# Patient Record
Sex: Female | Born: 1960 | Race: White | Hispanic: No | Marital: Married | Smoking: Never smoker
Health system: Southern US, Community
[De-identification: ages and names within clinical notes are randomized; demographics above are authoritative.]

## PROBLEM LIST (undated history)

## (undated) DIAGNOSIS — F329 Major depressive disorder, single episode, unspecified: Secondary | ICD-10-CM

## (undated) DIAGNOSIS — D329 Benign neoplasm of meninges, unspecified: Secondary | ICD-10-CM

## (undated) DIAGNOSIS — I10 Essential (primary) hypertension: Secondary | ICD-10-CM

## (undated) DIAGNOSIS — N289 Disorder of kidney and ureter, unspecified: Secondary | ICD-10-CM

## (undated) DIAGNOSIS — G43909 Migraine, unspecified, not intractable, without status migrainosus: Secondary | ICD-10-CM

## (undated) DIAGNOSIS — F309 Manic episode, unspecified: Secondary | ICD-10-CM

## (undated) DIAGNOSIS — F319 Bipolar disorder, unspecified: Secondary | ICD-10-CM

## (undated) DIAGNOSIS — D332 Benign neoplasm of brain, unspecified: Secondary | ICD-10-CM

## (undated) DIAGNOSIS — F32A Depression, unspecified: Secondary | ICD-10-CM

## (undated) DIAGNOSIS — F419 Anxiety disorder, unspecified: Secondary | ICD-10-CM

## (undated) HISTORY — DX: Bipolar disorder, unspecified: F31.9

## (undated) HISTORY — PX: INCONTINENCE SURGERY: SHX676

## (undated) HISTORY — PX: INTERSTIM IMPLANT PLACEMENT: SHX5130

## (undated) HISTORY — DX: Migraine, unspecified, not intractable, without status migrainosus: G43.909

## (undated) HISTORY — PX: TUBAL LIGATION: SHX77

## (undated) HISTORY — PX: BRAIN SURGERY: SHX531

## (undated) HISTORY — DX: Essential (primary) hypertension: I10

## (undated) HISTORY — DX: Manic episode, unspecified: F30.9

## (undated) HISTORY — PX: OTHER SURGICAL HISTORY: SHX169

## (undated) HISTORY — PX: KNEE SURGERY: SHX244

## (undated) HISTORY — DX: Anxiety disorder, unspecified: F41.9

## (undated) HISTORY — DX: Benign neoplasm of meninges, unspecified: D32.9

## (undated) HISTORY — PX: ABDOMINAL HYSTERECTOMY: SHX81

---

## 1999-05-11 ENCOUNTER — Encounter: Payer: Self-pay | Admitting: Internal Medicine

## 1999-05-11 ENCOUNTER — Ambulatory Visit (HOSPITAL_COMMUNITY): Admission: RE | Admit: 1999-05-11 | Discharge: 1999-05-11 | Payer: Self-pay | Admitting: Internal Medicine

## 1999-05-18 ENCOUNTER — Ambulatory Visit (HOSPITAL_COMMUNITY): Admission: RE | Admit: 1999-05-18 | Discharge: 1999-05-18 | Payer: Self-pay | Admitting: Gastroenterology

## 1999-06-01 ENCOUNTER — Ambulatory Visit (HOSPITAL_COMMUNITY): Admission: RE | Admit: 1999-06-01 | Discharge: 1999-06-01 | Payer: Self-pay | Admitting: Internal Medicine

## 1999-06-01 ENCOUNTER — Encounter: Payer: Self-pay | Admitting: Internal Medicine

## 2008-08-20 ENCOUNTER — Ambulatory Visit: Payer: Self-pay | Admitting: Psychiatry

## 2008-08-20 ENCOUNTER — Inpatient Hospital Stay (HOSPITAL_COMMUNITY): Admission: EM | Admit: 2008-08-20 | Discharge: 2008-09-02 | Payer: Self-pay | Admitting: Psychiatry

## 2008-09-07 ENCOUNTER — Inpatient Hospital Stay (HOSPITAL_COMMUNITY): Admission: RE | Admit: 2008-09-07 | Discharge: 2008-09-16 | Payer: Self-pay | Admitting: Psychiatry

## 2008-09-27 ENCOUNTER — Ambulatory Visit: Payer: Self-pay | Admitting: Psychiatry

## 2008-09-27 ENCOUNTER — Inpatient Hospital Stay (HOSPITAL_COMMUNITY): Admission: AD | Admit: 2008-09-27 | Discharge: 2008-10-07 | Payer: Self-pay | Admitting: Psychiatry

## 2008-10-10 ENCOUNTER — Ambulatory Visit (HOSPITAL_COMMUNITY): Payer: Self-pay | Admitting: Psychiatry

## 2008-10-19 ENCOUNTER — Ambulatory Visit (HOSPITAL_COMMUNITY): Payer: Self-pay | Admitting: Psychiatry

## 2008-11-02 ENCOUNTER — Ambulatory Visit (HOSPITAL_COMMUNITY): Payer: Self-pay | Admitting: Psychiatry

## 2008-11-16 ENCOUNTER — Ambulatory Visit (HOSPITAL_COMMUNITY): Payer: Self-pay | Admitting: Psychiatry

## 2008-12-07 ENCOUNTER — Ambulatory Visit (HOSPITAL_COMMUNITY): Payer: Self-pay | Admitting: Psychiatry

## 2008-12-27 ENCOUNTER — Ambulatory Visit (HOSPITAL_COMMUNITY): Payer: Self-pay | Admitting: Psychiatry

## 2008-12-29 ENCOUNTER — Ambulatory Visit: Payer: Self-pay | Admitting: Psychiatry

## 2008-12-29 ENCOUNTER — Inpatient Hospital Stay (HOSPITAL_COMMUNITY): Admission: AD | Admit: 2008-12-29 | Discharge: 2009-01-06 | Payer: Self-pay | Admitting: Psychiatry

## 2009-01-09 ENCOUNTER — Ambulatory Visit (HOSPITAL_COMMUNITY): Payer: Self-pay | Admitting: Psychiatry

## 2009-01-31 ENCOUNTER — Ambulatory Visit (HOSPITAL_COMMUNITY): Payer: Self-pay | Admitting: Psychiatry

## 2009-02-09 ENCOUNTER — Ambulatory Visit (HOSPITAL_COMMUNITY): Payer: Self-pay | Admitting: Psychiatry

## 2009-04-25 ENCOUNTER — Ambulatory Visit (HOSPITAL_COMMUNITY): Payer: Self-pay | Admitting: Psychiatry

## 2009-05-11 ENCOUNTER — Ambulatory Visit (HOSPITAL_COMMUNITY): Payer: Self-pay | Admitting: Psychiatry

## 2009-05-25 ENCOUNTER — Ambulatory Visit (HOSPITAL_COMMUNITY): Payer: Self-pay | Admitting: Psychiatry

## 2009-06-20 ENCOUNTER — Ambulatory Visit (HOSPITAL_COMMUNITY): Payer: Self-pay | Admitting: Psychiatry

## 2009-07-20 ENCOUNTER — Ambulatory Visit (HOSPITAL_COMMUNITY): Payer: Self-pay | Admitting: Psychiatry

## 2009-09-26 ENCOUNTER — Ambulatory Visit (HOSPITAL_COMMUNITY): Payer: Self-pay | Admitting: Psychiatry

## 2009-10-24 ENCOUNTER — Ambulatory Visit (HOSPITAL_COMMUNITY): Payer: Self-pay | Admitting: Psychiatry

## 2009-11-23 ENCOUNTER — Ambulatory Visit (HOSPITAL_COMMUNITY): Payer: Self-pay | Admitting: Psychiatry

## 2010-02-22 ENCOUNTER — Ambulatory Visit (HOSPITAL_COMMUNITY): Payer: Self-pay | Admitting: Psychiatry

## 2010-05-10 ENCOUNTER — Ambulatory Visit (HOSPITAL_COMMUNITY): Payer: Self-pay | Admitting: Psychiatry

## 2010-06-07 ENCOUNTER — Ambulatory Visit (HOSPITAL_COMMUNITY): Payer: Self-pay | Admitting: Psychiatry

## 2010-08-07 ENCOUNTER — Ambulatory Visit (HOSPITAL_COMMUNITY): Payer: Self-pay | Admitting: Psychiatry

## 2010-08-21 ENCOUNTER — Ambulatory Visit (HOSPITAL_COMMUNITY): Payer: Self-pay | Admitting: Psychiatry

## 2010-09-18 ENCOUNTER — Ambulatory Visit (HOSPITAL_COMMUNITY): Payer: Self-pay | Admitting: Psychiatry

## 2010-11-20 ENCOUNTER — Ambulatory Visit (HOSPITAL_COMMUNITY): Payer: Self-pay | Admitting: Psychiatry

## 2011-01-11 ENCOUNTER — Ambulatory Visit (HOSPITAL_COMMUNITY)
Admission: RE | Admit: 2011-01-11 | Discharge: 2011-01-11 | Payer: Self-pay | Source: Home / Self Care | Attending: Urology | Admitting: Urology

## 2011-01-11 ENCOUNTER — Ambulatory Visit
Admission: RE | Admit: 2011-01-11 | Discharge: 2011-01-11 | Payer: Self-pay | Source: Home / Self Care | Attending: Urology | Admitting: Urology

## 2011-01-22 ENCOUNTER — Ambulatory Visit (HOSPITAL_COMMUNITY)
Admission: RE | Admit: 2011-01-22 | Discharge: 2011-01-22 | Payer: Self-pay | Source: Home / Self Care | Attending: Psychiatry | Admitting: Psychiatry

## 2011-02-28 ENCOUNTER — Encounter (INDEPENDENT_AMBULATORY_CARE_PROVIDER_SITE_OTHER): Payer: Medicare (Managed Care) | Admitting: Psychiatry

## 2011-02-28 DIAGNOSIS — F3189 Other bipolar disorder: Secondary | ICD-10-CM

## 2011-03-19 ENCOUNTER — Encounter (HOSPITAL_COMMUNITY): Payer: Self-pay | Admitting: Psychiatry

## 2011-04-08 LAB — CBC
HCT: 43.6 % (ref 36.0–46.0)
Hemoglobin: 15.1 g/dL — ABNORMAL HIGH (ref 12.0–15.0)
MCHC: 34.6 g/dL (ref 30.0–36.0)
MCV: 93.3 fL (ref 78.0–100.0)
Platelets: 335 10*3/uL (ref 150–400)
RBC: 4.67 MIL/uL (ref 3.87–5.11)
RDW: 13.4 % (ref 11.5–15.5)
WBC: 15.1 10*3/uL — ABNORMAL HIGH (ref 4.0–10.5)

## 2011-04-08 LAB — COMPREHENSIVE METABOLIC PANEL
ALT: 11 U/L (ref 0–35)
AST: 14 U/L (ref 0–37)
Albumin: 4.1 g/dL (ref 3.5–5.2)
Alkaline Phosphatase: 102 U/L (ref 39–117)
BUN: 8 mg/dL (ref 6–23)
CO2: 30 mEq/L (ref 19–32)
Calcium: 10.1 mg/dL (ref 8.4–10.5)
Chloride: 104 mEq/L (ref 96–112)
Creatinine, Ser: 0.87 mg/dL (ref 0.4–1.2)
GFR calc Af Amer: >60 mL/min (ref >60)
GFR calc non Af Amer: >60 mL/min (ref >60)
Glucose, Bld: 101 mg/dL — ABNORMAL HIGH (ref 70–99)
Potassium: 4.6 mEq/L (ref 3.5–5.1)
Sodium: 140 mEq/L (ref 135–145)
Total Bilirubin: 0.9 mg/dL (ref 0.3–1.2)
Total Protein: 6.7 g/dL (ref 6.0–8.3)

## 2011-04-08 LAB — TSH: TSH: 3.448 u[IU]/mL (ref 0.350–4.500)

## 2011-04-08 LAB — LITHIUM LEVEL
Lithium Lvl: 0.92 mEq/L (ref 0.80–1.40)
Lithium Lvl: 1.12 mEq/L (ref 0.80–1.40)

## 2011-04-25 ENCOUNTER — Encounter (INDEPENDENT_AMBULATORY_CARE_PROVIDER_SITE_OTHER): Payer: Medicare (Managed Care) | Admitting: Psychiatry

## 2011-04-25 DIAGNOSIS — F3189 Other bipolar disorder: Secondary | ICD-10-CM

## 2011-05-07 NOTE — H&P (Signed)
[name], [name]                  ACCOUNT NO.:  [id number]   MEDICAL RECORD NO.:  [id number]          PATIENT TYPE:  IPS   LOCATION:  0405                          FACILITY:  [facility]   PHYSICIAN:  Anselm Jungling, MD  DATE OF BIRTH:  March 16, 1961   DATE OF ADMISSION:  08/20/2008  DATE OF DISCHARGE:                       PSYCHIATRIC ADMISSION ASSESSMENT   IDENTIFYING INFORMATION/JUSTIFICATION FOR ADMISSION AND CARE:  This is a  50 year old married white female.  They presented to the emergency  department at [facility] yesterday.  They reported that the The  patient is bipolar and having a depressive episode.  She is experiencing  auditory hallucinations that are command in nature, telling her to hurt  herself.  In fact, she actually broke her right arm, in response to  these voices, about 2 weeks ago.  Apparently, she hit her right arm with  a hammer, she scraped her head on bricks and attempted to go down a  clothes chute head first in the past.   The patient reports having had a benign brain tumor removed at [facility] in  [place] back in 2000.  Her psychiatric symptoms began shortly after  that.  She is followed in [place] even now.  She began having  psychiatric treatment in 2003.   SOCIAL HISTORY:  She is a high Garment/textile technologist in 1982.  She has been  married once.  She has 2 children, both are boys, 25 and 20.  She has  received disability since 2000.   FAMILY HISTORY:  She denies.   ALCOHOL/DRUG HISTORY:  She denies.   PRIMARY CARE Mikhael Hendriks:  She has just obtained primary care with Dr.  Pollyann Kennedy in [place].   She is followed psychiatrically in [place], and that physician's name  is Dr. Alycia Rossetti.  She is currently prescribed lithium 1200 mg at hour of  sleep, Lamictal 200 mg at hour of sleep, Klonopin 1 mg at hour of sleep,  perphenazine 8 mg at hour of sleep, and Celexa 20 mg p.o. every morning.   DRUG ALLERGIES:  1. DEPAKOTE.  2. DILANTIN.  3. TRILEPTAL.  4.  ERYTHROMYCIN-BASED MEDICINES.   POSITIVE PHYSICAL FINDINGS:  She was medically cleared in the ED at  [facility].  She does have a cast intact to her right lower forearm.  Her lithium level is low at 0.56.  he had no alcohol, she had no drugs  in her system that were not prescribed and her WBC was slightly elevated  at 11.4.   MENTAL STATUS EXAM:  Today she is alert, she is oriented.  She is  appropriately groomed, dressed and nourished.  Her speech is a normal  rate, rhythm and tone.  Her mood is calm.  Her affect has a decreased  range.  Her thought processes are clear, rational and goal-oriented.  She wants to get rid of the auditory hallucinations.  Judgment and  insight are good this morning.  Concentration and memory are good.  Intelligence is at least average.  She denies being actively suicidal or  homicidal.  She states the last time she had  auditory hallucinations is  last night and she has not had any visual hallucinations.   DIAGNOSIS:  AXIS I:  Bipolar, depressed; lithium level is  subtherapeutic.  AXIS II:  Deferred.  AXIS III:  Fracture of right arm in response to command auditory  hallucination to hurt herself 2 weeks ago.  AXIS IV:  Disabled.  AXIS V:  32.   PLAN:  Adjust her meds to get relief from the auditory hallucinations  and she does have psychiatric care in [place] with Dr. Alycia Rossetti and she can  return home with her husband once her meds have been successfully  adjusted.   ESTIMATED LENGTH OF STAY:  Three to five days.      Mickie Leonarda Salon, P.A.-C.      Anselm Jungling, MD  Electronically Signed    MD/MEDQ  D:  08/21/2008  T:  08/21/2008  Job:  [id number]

## 2011-05-07 NOTE — Discharge Summary (Signed)
NAMESEELEY, SOUTHGATE                  ACCOUNT NO.:  [id number]   MEDICAL RECORD NO.:  [id number]          PATIENT TYPE:  IPS   LOCATION:  0306                          FACILITY:  [facility]   PHYSICIAN:  Anselm Jungling, MD  DATE OF BIRTH:  [birth year]   DATE OF ADMISSION:  09/27/2008  DATE OF DISCHARGE:  10/07/2008                               DISCHARGE SUMMARY   IDENTIFYING DATA AND REASON FOR ADMISSION:  This was the third [facility]  admission for [name], Sue 50 year old married Caucasian [name], who was  admitted because of severe depression, which was accompanied by suicidal  ideation.  Please refer to the admission note for further details  pertaining to the symptoms, circumstances and history that led to her  hospitalization.  She was given an initial Axis I diagnosis of major  depressive disorder, recurrent.   MEDICAL AND LABORATORY:  The patient was medically and physically  assessed by the psychiatric nurse practitioner.  She was in generally  good health without any active or chronic medical problems.  She was  continued on Claritin 10 mg daily for seasonal allergies.   HOSPITAL COURSE:  The patient was admitted to the adult inpatient  psychiatric service.  She presented as Sue well-nourished, normally-  developed, adult [name] with Sue very pleasant and sweet demeanor, but who  was very depressed.  Her level of insight was fairly good, and she  showed consistently good judgment, cooperation, and participation  throughout her stay.  She was able to identify and work on various  family stressors.   The patient had 2 inpatient admissions with [place] over the prior 4 months.  During those admissions, she also had symptoms consisting of conversion-  like complaints regarding little men who caused her to commit acts of  self-harm.  None of these such symptoms were present in this particular  hospital stay.  Instead, the patient was focused in Sue very realistic  fashion on the sources of her  depression, coming from her family  conflict.   She was continued on her regimen of Lamictal, Celexa, Trilafon, lithium,  Klonopin, trazodone.   By the 11th hospital day, the patient indicated that she was feeling  much better, less depressed, and confident about being able to go home  and continue her treatment successfully on an outpatient basis.  She  agreed to the following aftercare plan.   AFTERCARE:  The patient was to follow-up with Dr. Lolly Mustache in our  [place] outpatient clinic.  She was also to see Dr. Florencia Reasons, for  individual counseling.  Her appointment with Florencia Reasons was scheduled  for October 19.   DISCHARGE MEDICATIONS:  1. Lamictal 50 mg q.Sue.m. and 200 mg every night.  2. Celexa 20 mg daily.  3. Trilafon 18 mg every night.  4. Lithium carbonate 900 mg b.i.d.  5. Trazodone 50 mg every night.  6. Claritin 10 mg daily.  7. Klonopin 1 mg every night.   DISCHARGE DIAGNOSES:  AXIS I:  Bipolar disorder, type 2, most recently  depressed.  AXIS II:  Deferred.  AXIS III:  No acute or chronic illnesses.  AXIS IV:  Stressors severe.  AXIS V:  GAF on discharge 60.      Anselm Jungling, MD  Electronically Signed     SPB/MEDQ  D:  10/13/2008  T:  10/13/2008  Job:  [id number] [id number]

## 2011-05-07 NOTE — H&P (Signed)
[name], [name]                  ACCOUNT NO.:  [id number]   MEDICAL RECORD NO.:  [id number]          PATIENT TYPE:  IPS   LOCATION:  0400                          FACILITY:  [facility]   PHYSICIAN:  Anselm Jungling, MD  DATE OF BIRTH:  29-Aug-1961   DATE OF ADMISSION:  09/07/2008  DATE OF DISCHARGE:                       PSYCHIATRIC ADMISSION ASSESSMENT   TIME OF ASSESSMENT:  8:30 a.m.   IDENTIFYING INFORMATION:  A 50 year old Caucasian female.  This is a  voluntary admission.   HISTORY OF PRESENT ILLNESS:  This is the second [facility] admission for this  50 year old with a history of psychotic like symptoms associated with  some command hallucinations.  She was previously discharged from our  unit this past Friday, September 11 after treatment for auditory  hallucinations, claims that she is hearing voices and seeing little  green men that were telling her to harm herself.  She had taken a  hammer and a rolling pin to her right forearm banging on it, thought  that she had fractured her wrist.  She reports that she went home,  taking her medications as prescribed, had a nightmare one night about  attempting to hang herself, woke up anxious the following morning and on  the day prior to admission she had been talking with her son about his  vacation, suddenly jumped up, ran to the side of the house and began  banging her head on the brick wall.  Obviously this caused the family a  lot of concern.  She visited her psychotherapist yesterday who referred  her back for admission.  Today she reports that she feels safe here in  the hospital, has not seen or heard any strange voices, had no  hallucinations today and slept well last night.  She continues to have  fears that she will hear the voices again and that she will harm her  arm.  Meanwhile, she had seen the orthopedist who advised removing the  splint from her right forearm, that there is no fracture.   PAST PSYCHIATRIC HISTORY:  [facility]  admission.  She currently is  followed as an outpatient at [facility] for counseling  and for medications.  She lives in [place] and in 2000 had a benign  brain tumor removed at [facility] of [place].  She reports that  her psychiatric symptoms began shortly after that.  She was most  recently followed at [facility] and was diagnosed  with pseudoseizures and about 4 months ago the Keppra which she has  previously taken for what she believed to be seizures was discontinued.  She has history of one prior suicide attempt by attempting to hang  herself with an electrical cord.   SOCIAL HISTORY:  High school graduate in 1982.  Currently married.  Reports that her husband is supportive, marriage is good.  She has two  children, sons ages 54 and 7.  Has been on disability since 2000  because of the brain tumor.   FAMILY HISTORY:  Denies a family history of mental illness or substance  abuse.  She  denies any history of substance abuse.   PRIMARY CARE Kalid Ghan:  Is Dr. Pollyann Kennedy in [place], [place].   MEDICAL PROBLEMS:  Recent history of contusion, right wrist.  No  fracture.  Other medical problems are none.   CURRENT MEDICATIONS:  1. Lithium 900 mg q.a.m. and q.h.s.  2. Lamictal 50 mg in the morning, 200 mg at h.s.  3. Klonopin 1 mg at h.s.  4. Perphenazine 16 mg p.o. q.h.s.  5. Celexa 20 mg daily.  6. Zyprexa Zydis 15 mg at h.s.  7. Robaxin 500 mg t.i.d. p.r.n. for muscle spasms that she was having      in her right arm while splinted.   DRUG ALLERGIES:  Are DEPAKOTE, DILANTIN, TRILEPTAL, ERYTHROMYCIN.   PHYSICAL EXAM:  Was done in the emergency room and is as noted in the  record and is generally unremarkable.  Her right forearm appears  completely normal.  She has full range of motion.   DIAGNOSTIC STUDIES:  CBC - WBC 10.6, hemoglobin 13.2, hematocrit 39.9,  platelets 326,000 and MCV 94.2.  Chemistry - sodium 140, potassium 3.8,   chloride 107, carbon dioxide 28, BUN 10, creatinine 0.77 and random  glucose 92.  Liver enzymes SGOT 33, SGPT 54, alkaline phosphatase 89,  total bilirubin 0.9.   MENTAL STATUS EXAM:  Fully alert female, pleasant, cooperative, fairly  bright affect.  She is appropriate, cooperative, polite.  Full range of  expression.  Speech is normal in pace, tone, production.  She gives a  coherent history.  Has relatively bright affect even when she is talking  about banging her head into the brick wall.  Reports she has no known  trigger for these episodes.  Unable to articulate any particular type of  stressor or be clear about what was going through her mind prior to this  episode.  Says that the episodes come out of the blue.  Has fears that  she will harm herself.  Feels that she cannot control herself.  No  homicidal thought.  Denies suicidal intent but admits that she feels out  of control.  Cognition is intact to orientation.  Immediate, recent and  remote memory are intact.  She is fully oriented and in full contact  with reality.   AXIS I:  Psychosis not otherwise specified, rule out fictitious disorder  versus conversion disorder.  AXIS II:  Deferred.  AXIS III:  Contusion, right wrist.  AXIS IV:  Deferred.  AXIS V:  Current 43, past year not known.   PLAN:  Is to voluntarily admit her to stabilize her thinking, evaluate  her mood.  She is on our intensive care unit in our stabilization  program and we started her on one-to-one observation for safety.  She  plans on continuing with counseling when she leaves and at this point we  are continuing her current medications.  On arrival in the unit she did  receive an extra Zyprexa 5 mg and Klonopin 1 mg.  We have continued her  current medications with the exception that we are going to discontinue  the Robaxin since she is no longer splinted and having problems with  muscle spasms and we will add Zyprexa 5 mg t.i.d. p.r.n. Zydis for   agitation.      Margaret A. Lorin Picket, N.P.      Anselm Jungling, MD  Electronically Signed    MAS/MEDQ  D:  09/08/2008  T:  09/11/2008  Job:  [id number]

## 2011-05-07 NOTE — Discharge Summary (Signed)
[name], [name]                  ACCOUNT NO.:  [id number]   MEDICAL RECORD NO.:  [id number]          PATIENT TYPE:  IPS   LOCATION:  0406                          FACILITY:  [facility]   PHYSICIAN:  Anselm Jungling, MD  DATE OF BIRTH:  [birth year]   DATE OF ADMISSION:  08/20/2008  DATE OF DISCHARGE:  09/02/2008                               DISCHARGE SUMMARY   IDENTIFYING DATA/REASON FOR ADMISSION:  The patient is a 50 year old  married Caucasian female who was admitted due to increasing symptoms of  psychosis, and self-inflicted injury.  Please refer to the admission  note for further details pertaining to the symptoms, circumstances and  history that led to her hospitalization.   MEDICAL AND LABORATORY:  The patient was medically and physically  assessed by the psychiatric nurse practitioner.  She had been seen by an  orthopedist in her hometown of [place], [place] for injury sustained  to her right forearm, by her own attempts to injure it.  She had a  supportive soft cast and brace on throughout her hospital stay.   Aside from this, there were no acute medical issues.  The patient was  given Robaxin because of some complaints of muscle spasm in that arm,  and p.r.n. ibuprofen.   HOSPITAL COURSE:  The patient was admitted to the adult inpatient  psychiatric service.  She presented as a well-nourished, normally-  developed adult female who was initially seen and evaluated by Dr.  Geoffery Lyons.  The undersigned assumed care of the patient on the third  hospital day.   The patient had come to [place] with a previous history of mood disorder, and  a history of benign brain tumor, meningioma, according to the patient,  removed in 2000.  She states that she had psychiatric symptoms that  began shortly after that.  She began having formal psychiatric treatment  in 2003.  The physician that had been following her for her psychiatric  medication is Dr. Alycia Rossetti in [place], [place].  She came to  [place] on a  regimen of lithium, Lamictal, Klonopin, perphenazine, and Celexa.   The patient reported that she was experiencing psychotic symptoms in the  form of little men who appear in her mind, and command her to commit  various acts of self-harm.  She could not explain these little men and  did not actually appear to believe that they existed in reality, but  they clearly existed in her thinking and in her internal psyche  experience, and she felt that their commands were quite compelling.  She  desperately wanted help dealing with this.  She did not present with any  other overt signs or symptoms of psychosis.  Her thoughts and speech  were normally organized.  She was consistently pleasant, sweet natured,  cooperative, and a pleasure to work with.   To address her hallucinatory symptoms, she was started on a trial of low-  dose Zyprexa, while her other psychotropics were continued.  Her lithium  level was therapeutic.   The patient required one-to-one observation during most of her hospital  stay, because of the propensity for episodes of attempted self-harm, in  response to the little men to come on very suddenly.  There were  incidents in which she did attempt to harm herself, and staff needed to  physically prevent her from doing this.   Zyprexa was increased to an ultimate dose of 15 mg q.h.s., over the  course of her stay.  During the last 4 days of her hospital stay, the  patient reported that she had had episodes where the little men wanted  her to harm herself, but she was able to resist them and tell them no.  There were no attempted incidences of self-harm during her last 4  hospital days.  Her mental status remained overtly clear during this  entire time.   The patient was taken off one-to-one observation for the last 72 hours  or so of her hospital stay.   Her husband was involved in her care, and he was available for a family  session during her stay.  He was  tremendously supportive.   Although the patient said condition improved significantly by the time  of discharge and there was no reason to believe that she warranted a  longer inpatient psychiatric stay, it was anticipated that she would  need close psychiatric follow-up following her discharge.  She indicated  that she wanted to be referred to a psychiatrist in the [place] area,  and she no longer wanted to travel to [place], [place] for her care.  This was accomplished (see below).  We also discussed with her the  possibility of coming to our intensive outpatient psychiatric program  following her inpatient stabilization.  We determined that this was  impractical due to the geographic distance, and due to the patient not  being able to drive given her current medication regimen.   AFTERCARE:  The patient was discharged to her husband in good spirits on  the 14th hospital day.  She was to follow-up at [facility] counseling  services with an appointment for intake on September 06, 2008.   DISCHARGE MEDICATIONS:  1. Lithium carbonate 600 mg q.a.m. and 1200 mg q.h.s.  2. Lamictal 50 mg q.a.m. and 200 mg q.h.s.  3. Klonopin 1 mg q.h.s.  4. Perphenazine 16 mg q.h.s.  5. Celexa 20 mg daily.  6. Zyprexa Zydis 15 mg q.h.s.  7. Robaxin 500 mg t.i.d.   The patient was instructed to follow-up with her usual orthopedist in  [place], [place], and she did have an appointment scheduled for him  during the week following her discharge.   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  AXIS III: Status post right arm injury.  AXIS IV: Stressors severe.  AXIS V: GAF on discharge 55.      Anselm Jungling, MD  Electronically Signed     SPB/MEDQ  D:  09/02/2008  T:  09/03/2008  Job:  [id number]

## 2011-05-07 NOTE — Discharge Summary (Signed)
[name], [name]                  ACCOUNT NO.:  [id number]   MEDICAL RECORD NO.:  [id number]          PATIENT TYPE:  IPS   LOCATION:  0305                          FACILITY:  BH   PHYSICIAN:  Anselm Jungling, MD  DATE OF BIRTH:  12-Apr-1961   DATE OF ADMISSION:  12/29/2008  DATE OF DISCHARGE:  01/06/2009                               DISCHARGE SUMMARY   IDENTIFYING DATA AND REASON FOR ADMISSION:  This is one of several [facility]  admissions for [name], a 50 year old married Caucasian female who was  admitted due to increasing depression, with impulses to harm herself.  Please refer to the admission note for further details pertaining to the  symptoms, circumstances and history that led to her hospitalization.  She was given an initial Axis I diagnosis of major depressive disorder,  recurrent.   LABORATORY:  The patient was in good health without any active or  chronic medical problems.  She had inflicted some abrasions on her  forehead prior to admission.  There were no acute medical issues.   HOSPITAL COURSE:  The patient was admitted to the adult inpatient  psychiatric service.  She presented as a well-nourished, normally-  developed adult female who was familiar to the undersigned in our staff.  She was consistently pleasant, cooperative throughout, but very sad and  depressed.  She was able to show good insight into some of the sources  of her depression, including the recent estrangement from her adult son,  daughter-in-law, and their new grandchild, who the patient had not been  able to see over the entire holiday..   The patient was continued on her psychotropic regimen, which continued  with Lamictal, Klonopin, Celexa, Ambien, lithium, and Cogentin.  The  patient had been taking Trilafon as well, but did not feel that it  agreed with her.  As such, Haldol was introduced on a trial basis in  favor of Trilafon.  This seemed to be very effective and the patient was  pleased with a  response that she got.  She stated that she no longer  felt odd the way she had on the Trilafon.   On the eighth hospital day the patient appeared appropriate for  discharge.  There was family session involving the patient and her  husband, who was again very supportive of her prior to discharge.  The  following aftercare plans were discussed at length.   AFTERCARE:  The patient was to follow up at the [facility] for medication management with Dr. Madie Reno on February 9.  She  was to continue with her usual outpatient psychotherapist, Florencia Reasons  on January 09, 2009.   DISCHARGE DIAGNOSIS:  1. AXIS I:  Major depressive disorder, recurrent.  2. AXIS II:  Deferred.  3. AXIS III:  No acute or chronic illnesses.  4. AXIS IV: Stressors severe.  5. AXIS V: GAF on discharge 55.   DISCHARGE MEDICATIONS:  1. Lamictal 50 mg q.a.m. and 200 mg every night.  2. Klonopin 1 mg every night.  3. Celexa 20 mg daily.  4. Ambien 10 mg every night..  5. Lithium 300 mg b.i.d.  6. Haldol 10 mg every night.  7. Cogentin 0.5 mg b.i.d..      Anselm Jungling, MD  Electronically Signed     SPB/MEDQ  D:  01/10/2009  T:  01/10/2009  Job:  [id number]

## 2011-05-07 NOTE — Discharge Summary (Signed)
[name], [name]                  ACCOUNT NO.:  [id number]   MEDICAL RECORD NO.:  [id number]          PATIENT TYPE:  IPS   LOCATION:  0400                          FACILITY:  [facility]   PHYSICIAN:  Anselm Jungling, MD  DATE OF BIRTH:  Oct 07, 1961   DATE OF ADMISSION:  09/07/2008  DATE OF DISCHARGE:  09/16/2008                               DISCHARGE SUMMARY   IDENTIFYING DATA AND REASON FOR ADMISSION:  The patient is a 50 year old  married woman, and this was her second [facility] admission.  She had been  discharged approximately one week prior.  She was readmitted due to  signs and symptoms of depression, and strong urges to harm herself.  Please refer to the admission note for further details pertaining to the  symptoms, circumstances and history that led to her hospitalization.  She was given an initial Axis I diagnosis of mood disorder NOS.   MEDICAL AND LABORATORY:  The patient was medically and physically  assessed by the psychiatric nurse practitioner.  She was in good health  without any active or chronic medical problems.   HOSPITAL COURSE:  The patient was admitted to the adult inpatient  psychiatric service.  She presented as a very pleasant, intelligent  woman who was very sad, depressed and worried about her situation.  She  was completely cooperative throughout her entire inpatient stay.  She  was afraid of her impulses to harm herself and as such, one to one  observation was utilized during the initially portion of her stay, until  we and the patient were both confident that she was able to control  these impulses and that the risk of self harm was significantly reduced.   She was continued on a psychotropic regimen that included lithium,  Lamictal, Celexa, Klonopin and Trilafon.   The patient admitted to some longstanding family issues that were  enormously stressful to here.  These centered around a rift that had  developed between her oldest son and daughter-in-law, who  have just  given birth to the patient's first grandchild.  This rift has led to a  lack of contact between the patient and these other family members.  She  has felt unable to do anything about it, but was able to acknowledge the  likelihood that this immediate stressor was the most likely cause for  her various underlying depression and symptoms.   Towards the end of her hospital stay the undersigned met with the  patient, her husband and her youngest son to discuss her issues and  treatment.  We discussed the family dynamics at length.  Husband was  enormously supportive.  He confirmed that there had been a significant  rift within the family.  Both the patient and her husband were in  agreement that it would make sense for family therapy to occur.  We  discussed how the family might obtain this through [facility], where [place] had already begun to receive some services in the  form of individual counseling and medication management.   The patient felt quite encouraged by this meeting,  and I have requested  discharge shortly following this.   AFTERCARE:  The patient was to continue with her usual followup at the  [facility], for individual counseling, family  counseling and medication management.   DISCHARGE MEDICATIONS:  1. Lithium 900 mg b.i.d.  2. Lamictal 50 mg daily and 200 mg nightly.  3. Celexa 30 mg daily.  4. Klonopin 1 mg nightly.  5. Trilafon 24 mg nightly.   The patient was instructed to stop Zyprexa which she had been on prior  to admission.   DISCHARGE DIAGNOSES:  AXIS I:  Atypical bipolar disorder NOS.  AXIS II:  Deferred.  AXIS III:  No acute or chronic illnesses.  AXIS IV:  Stressors, severe.  AXIS V:  Global Assessment of Functioning on discharge 60.      Anselm Jungling, MD  Electronically Signed     SPB/MEDQ  D:  09/29/2008  T:  09/29/2008  Job:  [id number]

## 2011-05-28 ENCOUNTER — Encounter (INDEPENDENT_AMBULATORY_CARE_PROVIDER_SITE_OTHER): Payer: Medicare (Managed Care) | Admitting: Psychiatry

## 2011-05-28 DIAGNOSIS — F3189 Other bipolar disorder: Secondary | ICD-10-CM

## 2011-06-27 ENCOUNTER — Encounter (HOSPITAL_COMMUNITY): Payer: Medicare (Managed Care) | Admitting: Psychiatry

## 2011-07-02 ENCOUNTER — Encounter (HOSPITAL_COMMUNITY): Payer: Medicare (Managed Care) | Admitting: Psychiatry

## 2011-07-09 ENCOUNTER — Encounter (INDEPENDENT_AMBULATORY_CARE_PROVIDER_SITE_OTHER): Payer: Medicare (Managed Care) | Admitting: Psychiatry

## 2011-07-09 DIAGNOSIS — F3189 Other bipolar disorder: Secondary | ICD-10-CM

## 2011-07-10 NOTE — Progress Notes (Signed)
[name], [facility]                  ACCOUNT NO.:  [id number]  MEDICAL RECORD NO.:  [id number]  LOCATION:  [facility]                           FACILITY:  [facility]  PHYSICIAN:  Kerina Simoneau T. Ariellah Faust, M.D.   DATE OF BIRTH:  [birth year]                                PROGRESS NOTE   The patient came in for her appointment.  She has been under a lot of stress.  She admitted being tearful, crying and having poor sleep.  A week ago, she called [place] for the same reason, and we had increased her Risperdal to 1 mg and also restarted the lithium at 1200 mg a day. Earlier, lithium was decreased due to the patient's continuing to have nausea in the morning.  However, the patient realized that the nausea was not related to lithium as she tried to overdose, but she continued to have nausea.  The patient admitted that she is under a lot of stress. Her son called last week and did not invite them for their grandchild's birthday.  The patient became very sad, disappointed and has been having episodes of crying tearfulness and depressive thoughts.  She admitted even passive suicidal thinking, as she is very attached to the family. However, there was no active suicidal thinking.  Her husband has been watching carefully her medication.  The patient admitted that despite taking 1 mg of Risperdal, Wellbutrin 300 in divided doses, lithium 1200 mg in divided doses and Klonopin 1 mg at bedtime, she continued to have struggle sleeping in the night.  She also feels racing thoughts, anxiety and panic attack.  She had tried taking an extra half of Klonopin last night that did actually help a lot.  She is wondering if the Klonopin can be increased a further 1/2 mg to make it 1.5 mg at bedtime.  MENTAL STATUS EXAMINATION: The patient is anxious and tearful but cooperative.  Her speech is soft but relevant.  Her thought process is logical, linear and goal-directed. She denies any auditory hallucinations, suicidal thoughts or  homicidal thoughts.  However, her mood as depressed, and affect is constricted. She has difficulty sometime in concentration and attention but overall, she is relevant and conversational.  There is no psychosis present.  She is alert and oriented x3.  Her insight, judgment, impulse control are okay.  DIAGNOSES: Axis I:  Bipolar disorder. Axis II:  Deferred. Axis III:  Mild obesity. Axis IV:  Moderate. Axis V:  60-65.  PLAN: I talked to the patient at length about her symptoms.  At this time, the patient does endorse financial distress and does not want to change to any brand-name medications or any other medications which are more expensive than her current medications.  She also is very reluctant to restart therapy due to the cost expense.  She would like to continue the current medication.  However, she requested to increase Klonopin to 1.5 mg.  I will increase the Klonopin to 1.5 mg.  She will continue to take Risperdal 1 mg and all other medication as prescribed.  We will reassess her in 2 weeks.  We will consider changing the medication if extra Klonopin and change in  Risperdal dose do not help her.  I also talked in detail about a safety plan in case she starts having active suicidal thoughts or relapse into severe psychiatric illness.  Then, she needs to call 9-1-1, go to a local ER or call a crisis hot line which she acknowledged.  I will see her again in 2 weeks.  I explained the risks and benefits of medication in detail.     Nyx Keady T. Lolly Mustache, M.D.     STA/MEDQ  D:  07/09/2011  T:  07/09/2011  Job:  [id number]  Electronically Signed by Kathryne Sharper M.D. on 07/10/2011 01:56:39 PM

## 2011-07-23 ENCOUNTER — Encounter (INDEPENDENT_AMBULATORY_CARE_PROVIDER_SITE_OTHER): Payer: Medicare (Managed Care) | Admitting: Psychiatry

## 2011-07-23 DIAGNOSIS — F3189 Other bipolar disorder: Secondary | ICD-10-CM

## 2011-08-13 ENCOUNTER — Encounter (INDEPENDENT_AMBULATORY_CARE_PROVIDER_SITE_OTHER): Payer: Medicare (Managed Care) | Admitting: Psychiatry

## 2011-08-13 DIAGNOSIS — F3189 Other bipolar disorder: Secondary | ICD-10-CM

## 2011-08-22 ENCOUNTER — Encounter (HOSPITAL_COMMUNITY): Payer: Medicare (Managed Care) | Admitting: Psychiatry

## 2011-09-23 LAB — COMPREHENSIVE METABOLIC PANEL
ALT: 54 — ABNORMAL HIGH
AST: 33
Albumin: 3.4 — ABNORMAL LOW
Alkaline Phosphatase: 89
BUN: 10
CO2: 28
Calcium: 9.5
Chloride: 107
Creatinine, Ser: 0.77
GFR calc Af Amer: >60
GFR calc non Af Amer: >60
Glucose, Bld: 92
Potassium: 3.8
Sodium: 140
Total Bilirubin: 0.9
Total Protein: 6

## 2011-09-23 LAB — CBC
HCT: 39.9
Hemoglobin: 13.2
MCHC: 33.2
MCV: 94.2
Platelets: 326
RBC: 4.23
RDW: 13
WBC: 10.6 — ABNORMAL HIGH

## 2011-09-23 LAB — LITHIUM LEVEL
Lithium Lvl: 0.72 — ABNORMAL LOW
Lithium Lvl: 0.93

## 2011-09-23 LAB — TSH: TSH: 5.075 — ABNORMAL HIGH

## 2011-09-24 LAB — COMPREHENSIVE METABOLIC PANEL
ALT: 19
AST: 17
Albumin: 3.5
Alkaline Phosphatase: 98
BUN: 10
CO2: 29
Calcium: 9.6
Chloride: 103
Creatinine, Ser: 0.91
GFR calc Af Amer: >60
GFR calc non Af Amer: >60
Glucose, Bld: 94
Potassium: 3.8
Sodium: 138
Total Bilirubin: 1.4 — ABNORMAL HIGH
Total Protein: 6.3

## 2011-09-24 LAB — URINALYSIS, ROUTINE W REFLEX MICROSCOPIC
Bilirubin Urine: NEGATIVE
Glucose, UA: NEGATIVE
Ketones, ur: NEGATIVE
Nitrite: NEGATIVE
Protein, ur: NEGATIVE
Specific Gravity, Urine: 1.013
Urobilinogen, UA: 1
pH: 7.5

## 2011-09-24 LAB — TSH: TSH: 3.239

## 2011-09-24 LAB — URINE CULTURE
Colony Count: 75000
Special Requests: NEGATIVE

## 2011-09-24 LAB — LITHIUM LEVEL: Lithium Lvl: 1.33

## 2011-09-24 LAB — URINE MICROSCOPIC-ADD ON

## 2011-09-24 LAB — MAGNESIUM: Magnesium: 2.4

## 2011-09-25 LAB — HEPATIC FUNCTION PANEL
ALT: 43 — ABNORMAL HIGH
AST: 31
Albumin: 3.2 — ABNORMAL LOW
Alkaline Phosphatase: 73
Bilirubin, Direct: 0.1
Indirect Bilirubin: 0.7
Total Bilirubin: 0.8
Total Protein: 5.4 — ABNORMAL LOW

## 2011-09-25 LAB — LITHIUM LEVEL
Lithium Lvl: 0.76 — ABNORMAL LOW
Lithium Lvl: 0.89

## 2011-09-25 LAB — TSH: TSH: 2.098

## 2011-09-25 LAB — T4, FREE: Free T4: 0.88 — ABNORMAL LOW

## 2011-09-25 LAB — T3, FREE: T3, Free: 2.7 (ref 2.3–4.2)

## 2011-10-08 ENCOUNTER — Encounter (INDEPENDENT_AMBULATORY_CARE_PROVIDER_SITE_OTHER): Payer: Medicare (Managed Care) | Admitting: Psychiatry

## 2011-10-08 DIAGNOSIS — F319 Bipolar disorder, unspecified: Secondary | ICD-10-CM

## 2011-11-05 ENCOUNTER — Other Ambulatory Visit (HOSPITAL_COMMUNITY): Payer: Self-pay | Admitting: Psychiatry

## 2011-11-05 MED ORDER — LITHIUM CARBONATE ER 450 MG PO TBCR: 450.0000 mg | EXTENDED_RELEASE_TABLET | Freq: Two times a day (BID) | ORAL | Status: DC

## 2011-11-12 ENCOUNTER — Encounter (HOSPITAL_COMMUNITY): Payer: Self-pay | Admitting: Psychiatry

## 2011-11-12 ENCOUNTER — Encounter (HOSPITAL_COMMUNITY): Payer: Medicare (Managed Care) | Admitting: Psychiatry

## 2011-11-12 ENCOUNTER — Ambulatory Visit (INDEPENDENT_AMBULATORY_CARE_PROVIDER_SITE_OTHER): Payer: Medicare (Managed Care) | Admitting: Psychiatry

## 2011-11-12 VITALS — Wt 189.0 lb

## 2011-11-12 DIAGNOSIS — F319 Bipolar disorder, unspecified: Secondary | ICD-10-CM

## 2011-11-12 MED ORDER — LITHIUM CARBONATE ER 450 MG PO TBCR: 450.0000 mg | EXTENDED_RELEASE_TABLET | Freq: Two times a day (BID) | ORAL | Status: DC

## 2011-11-12 MED ORDER — CLONAZEPAM 1 MG PO TABS: ORAL_TABLET | ORAL | Status: DC

## 2011-11-12 MED ORDER — RISPERIDONE 1 MG PO TABS: 1.0000 mg | ORAL_TABLET | Freq: Every day | ORAL | Status: DC

## 2011-11-12 MED ORDER — BUPROPION HCL ER (SR) 150 MG PO TB12: 150.0000 mg | ORAL_TABLET | Freq: Two times a day (BID) | ORAL | Status: DC

## 2011-11-12 NOTE — Progress Notes (Signed)
Patient came for her followup appointment. She is now taking lithium ER 450 1 in the morning and 1 at bedtime. Her complaint of nausea has been improved and now she liked to continue the same dosage. Initially we thought we can switch to Lamictal if this dose of lithium does not work for her. Patient reported her anxiety agitation anger and mood has been improved however there was one episode been she become very nervous been her mother surprise her in the restaurant. She reported 7-8 hours of sleep. She mentioned she recently have blood in the urine which was resolved few months ago. She has extensive workup including cystoscopy and she needed antibiotics. She is again scheduled to see specialist and not sure she may require another course of antibiotic. So far she is tolerating her medication and reported no side effects. Her paranoia and anger has been improved.  Mental status emanation Patient is pleasant cooperative with good eye contact. Her speech is soft and clear. She denies any orderly hallucinations suicidal thoughts or homicidal thoughts. She described her mood anxious and her affect was constricted but overall she was alert and oriented x3. There were no psychotic symptoms present. Her attention and concentration is fair. She's alert and oriented x3. Her insight judgment and impulse control is okay.  Assessment Bipolar disorder  Plan I will continue Wellbutrin SR 150 twice a day, Risperdal 1 mg at bedtime, and lithium he or for 450 mg 1 in the morning and 1 at bedtime, and Klonopin 1.5 mg at bedtime. She's reporting no side effects of medication. I have explained risks and benefits of taking medication. I recommended to call us if she has any question or concern. We will followup on her medical issue and she uses scheduled to see it specialist next week. I will consider lithium level on her next visit. I will see her again in 2 months.

## 2011-12-06 ENCOUNTER — Other Ambulatory Visit (HOSPITAL_COMMUNITY): Payer: Self-pay | Admitting: Psychiatry

## 2012-01-16 ENCOUNTER — Ambulatory Visit (HOSPITAL_COMMUNITY): Payer: Medicare (Managed Care) | Admitting: Psychiatry

## 2012-01-26 ENCOUNTER — Encounter (HOSPITAL_COMMUNITY): Payer: Self-pay | Admitting: *Deleted

## 2012-01-26 ENCOUNTER — Emergency Department (HOSPITAL_COMMUNITY)
Admission: EM | Admit: 2012-01-26 | Discharge: 2012-01-26 | Disposition: A | Discharge status: Home / Self Care | Payer: PRIVATE HEALTH INSURANCE | Attending: Emergency Medicine | Admitting: Emergency Medicine

## 2012-01-26 ENCOUNTER — Other Ambulatory Visit: Payer: Self-pay

## 2012-01-26 ENCOUNTER — Emergency Department (HOSPITAL_COMMUNITY): Payer: PRIVATE HEALTH INSURANCE

## 2012-01-26 DIAGNOSIS — Z87440 Personal history of urinary (tract) infections: Secondary | ICD-10-CM | POA: Insufficient documentation

## 2012-01-26 DIAGNOSIS — I498 Other specified cardiac arrhythmias: Secondary | ICD-10-CM | POA: Insufficient documentation

## 2012-01-26 DIAGNOSIS — I10 Essential (primary) hypertension: Secondary | ICD-10-CM | POA: Insufficient documentation

## 2012-01-26 DIAGNOSIS — R079 Chest pain, unspecified: Secondary | ICD-10-CM | POA: Insufficient documentation

## 2012-01-26 DIAGNOSIS — R10812 Left upper quadrant abdominal tenderness: Secondary | ICD-10-CM | POA: Insufficient documentation

## 2012-01-26 DIAGNOSIS — Z9079 Acquired absence of other genital organ(s): Secondary | ICD-10-CM | POA: Insufficient documentation

## 2012-01-26 LAB — BASIC METABOLIC PANEL
BUN: 8 mg/dL (ref 6–23)
CO2: 22 mEq/L (ref 19–32)
Calcium: 9.6 mg/dL (ref 8.4–10.5)
Chloride: 109 mEq/L (ref 96–112)
Creatinine, Ser: 0.68 mg/dL (ref 0.50–1.10)
GFR calc Af Amer: >90 mL/min (ref >90)
GFR calc non Af Amer: >90 mL/min (ref >90)
Glucose, Bld: 97 mg/dL (ref 70–99)
Potassium: 3.5 mEq/L (ref 3.5–5.1)
Sodium: 139 mEq/L (ref 135–145)

## 2012-01-26 LAB — CBC
HCT: 41.9 % (ref 36.0–46.0)
Hemoglobin: 14.3 g/dL (ref 12.0–15.0)
MCH: 31.9 pg (ref 26.0–34.0)
MCHC: 34.1 g/dL (ref 30.0–36.0)
MCV: 93.5 fL (ref 78.0–100.0)
Platelets: 239 10*3/uL (ref 150–400)
RBC: 4.48 MIL/uL (ref 3.87–5.11)
RDW: 12.7 % (ref 11.5–15.5)
WBC: 5.9 10*3/uL (ref 4.0–10.5)

## 2012-01-26 LAB — D-DIMER, QUANTITATIVE: D-Dimer, Quant: 0.28 ug/mL-FEU (ref 0.00–0.48)

## 2012-01-26 LAB — LIPASE, BLOOD: Lipase: 20 U/L (ref 11–59)

## 2012-01-26 LAB — POCT I-STAT TROPONIN I
Troponin i, poc: 0 ng/mL (ref 0.00–0.08)
Troponin i, poc: 0 ng/mL (ref 0.00–0.08)

## 2012-01-26 LAB — HEPATIC FUNCTION PANEL
ALT: 9 U/L (ref 0–35)
AST: 10 U/L (ref 0–37)
Albumin: 3.7 g/dL (ref 3.5–5.2)
Alkaline Phosphatase: 64 U/L (ref 39–117)
Bilirubin, Direct: 0.2 mg/dL (ref 0.0–0.3)
Indirect Bilirubin: 0.6 mg/dL (ref 0.3–0.9)
Total Bilirubin: 0.8 mg/dL (ref 0.3–1.2)
Total Protein: 6.3 g/dL (ref 6.0–8.3)

## 2012-01-26 MED ORDER — MORPHINE SULFATE 4 MG/ML IJ SOLN
4.0000 mg | Freq: Once | INTRAMUSCULAR | Status: AC
  Administered 2012-01-26: 4 mg via INTRAVENOUS
  Filled 2012-01-26: qty 1

## 2012-01-26 MED ORDER — HYDROCODONE-ACETAMINOPHEN 5-325 MG PO TABS: 1.0000 | ORAL_TABLET | Freq: Four times a day (QID) | ORAL | Status: AC | PRN

## 2012-01-26 NOTE — ED Provider Notes (Signed)
History  Scribed for [name], MD, the patient was seen in room APA05/APA05. This chart was scribed by Candelaria Stagers. The patient's care started at 7:25 AM    CSN: [id number]  Arrival date & time 01/26/12  4098   First MD Initiated Contact with Patient 01/26/12 0719      Chief Complaint  Patient presents with  . Chest Pain    Patient is a 51 y.o. female presenting with chest pain.  Chest Pain The chest pain began 1 - 2 hours ago. Chest pain occurs constantly. The pain radiates to the left jaw and left arm. Chest pain is worsened by deep breathing. Pertinent negatives for primary symptoms include no fever, no shortness of breath, no cough and no nausea.  Pertinent negatives for associated symptoms include no diaphoresis. She tried aspirin for the symptoms.  Her family medical history is significant for CAD in family.    [name] is a 51 y.o. female who presents to the Emergency Department complaining of chest pain that started about two hours ago.  She states that the pain radiates to her left shoulder and jaw and feels like a pressure.  She is also experiencing "stomach churning" that began about an hour ago and states that taking a deep breathe makes the pain worse.  She has taken asa with no relief.  Pt reports that she has experienced similar sx in the past, but today is worse.  She denies diaphoresis.  Her father has CHF.  She does not smoke.      Past Medical History  Diagnosis Date  . HTN (hypertension)   . UTI (lower urinary tract infection)     Past Surgical History  Procedure Date  . Brain surgery   . Abdominal hysterectomy   . Knee surgery     right x 2  . Incontinence surgery     History reviewed. No pertinent family history.  History  Substance Use Topics  . Smoking status: Never Smoker   . Smokeless tobacco: Not on file  . Alcohol Use: No    OB History    Grav Para Term Preterm Abortions TAB SAB Ect Mult Living                  Review  of Systems  Constitutional: Negative for fever and diaphoresis.  Respiratory: Negative for cough and shortness of breath.   Cardiovascular: Positive for chest pain.  Gastrointestinal: Negative for nausea.  Musculoskeletal: Negative for joint swelling.  All other systems reviewed and are negative.    Allergies  Codeine; Depakote; Dilantin; and Erythromycin  Home Medications   Current Outpatient Rx  Name Route Sig Dispense Refill  . BUPROPION HCL ER (SR) 150 MG PO TB12 Oral Take 1 tablet (150 mg total) by mouth 2 (two) times daily. 60 tablet 1  . CLONAZEPAM 1 MG PO TABS  Take 1 and 1/2 at bed time 45 tablet 1  . LISINOPRIL 2.5 MG PO TABS Oral Take 2.5 mg by mouth daily.      Marland Kitchen LITHIUM CARBONATE ER 450 MG PO TBCR Oral Take 1 tablet (450 mg total) by mouth 2 (two) times daily. 60 tablet 1  . RISPERIDONE 1 MG PO TABS Oral Take 1 tablet (1 mg total) by mouth at bedtime. 30 tablet 1    BP 112/79  Temp(Src) 98.1 F (36.7 C) (Oral)  Resp 16  Ht 5\' 3"  (1.6 m)  Wt 170 lb (77.111 kg)  BMI 30.11 kg/m2  SpO2 100%  Physical Exam  Nursing note and vitals reviewed. Constitutional: She is oriented to person, place, and time. She appears well-developed and well-nourished. No distress.  HENT:  Head: Normocephalic and atraumatic.  Eyes: Right eye exhibits no discharge. Left eye exhibits no discharge.  Neck: Normal range of motion. Neck supple.  Cardiovascular: Normal rate and regular rhythm.   Pulmonary/Chest: Effort normal. She exhibits tenderness.  Abdominal: Soft. There is tenderness (minimal LUQ tenderness). There is no rebound.  Musculoskeletal: Normal range of motion. She exhibits no edema and no tenderness.       Baseline ROM, no obvious new focal weakness.  Neurological: She is alert and oriented to person, place, and time.       Mental status and motor strength appears baseline for patient and situation.  Skin: Skin is warm and dry. No rash noted. She is not diaphoretic.    Psychiatric: She has a normal mood and affect. Her behavior is normal.    ED Course  Procedures   DIAGNOSTIC STUDIES: Oxygen Saturation is 100% on room air, normal by my interpretation.    COORDINATION OF CARE:  7:30AM Ordered: D-dimer, quantitative ; morphine 4 MG/ML injection 4 mg ; Hepatic function panel ; Lipase, blood  9:27AM Ordered: I-Stat tropoinin I cardiac marker  11:03 AM Recheck: Discussed results with pt and course of care.     Labs Reviewed  CBC  BASIC METABOLIC PANEL  D-DIMER, QUANTITATIVE  HEPATIC FUNCTION PANEL  LIPASE, BLOOD  POCT I-STAT TROPONIN I  POCT I-STAT TROPONIN I   Chest Portable 1 View  01/26/2012  *RADIOLOGY REPORT*  Clinical Data: Chest pain  PORTABLE CHEST - 1 VIEW  Comparison: None.  Findings: Cardiomediastinal silhouette is within normal limits. The lungs are clear. No pleural effusion.  No pneumothorax.  No acute osseous abnormality. Aorta is ectatic and unfolded.  IMPRESSION: No acute cardiopulmonary process.  Original Report Authenticated By: Harrel Lemon, M.D.     No diagnosis found.  Date: 01/26/2012  Rate: 64  Rhythm: normal sinus rhythm  With sinus arrhythmia  QRS Axis: normal  Intervals: normal  ST/T Wave abnormalities: normal  Conduction Disutrbances:none  Narrative Interpretation:   Old EKG Reviewed: none available    MDM  The chart was scribed for me under my direct supervision.  I personally performed the history, physical, and medical decision making and all procedures in the evaluation of this patient.[name], MD 01/26/12 [phone]

## 2012-01-26 NOTE — ED Notes (Signed)
Pt states that she started having chest pain at 6 am that is radiating to her left shoulder and into her jaw. Pt also c/o nausea. Pt states that she was short of breath when the pain first started. Pt took ASA 325 mg prior to arrival.

## 2012-01-27 LAB — POCT I-STAT TROPONIN I: Troponin i, poc: 0 ng/mL (ref 0.00–0.08)

## 2012-01-28 ENCOUNTER — Ambulatory Visit (INDEPENDENT_AMBULATORY_CARE_PROVIDER_SITE_OTHER): Payer: PRIVATE HEALTH INSURANCE | Admitting: Psychiatry

## 2012-01-28 ENCOUNTER — Encounter (HOSPITAL_COMMUNITY): Payer: Self-pay | Admitting: Psychiatry

## 2012-01-28 VITALS — BP 100/64 | HR 78 | Wt 171.0 lb

## 2012-01-28 DIAGNOSIS — F0634 Mood disorder due to known physiological condition with mixed features: Secondary | ICD-10-CM | POA: Insufficient documentation

## 2012-01-28 DIAGNOSIS — F319 Bipolar disorder, unspecified: Secondary | ICD-10-CM

## 2012-01-28 MED ORDER — BUPROPION HCL ER (SR) 150 MG PO TB12: 150.0000 mg | ORAL_TABLET | Freq: Two times a day (BID) | ORAL | Status: DC

## 2012-01-28 MED ORDER — LITHIUM CARBONATE ER 450 MG PO TBCR: 450.0000 mg | EXTENDED_RELEASE_TABLET | Freq: Two times a day (BID) | ORAL | Status: DC

## 2012-01-28 MED ORDER — CLONAZEPAM 1 MG PO TABS: ORAL_TABLET | ORAL | Status: DC

## 2012-01-28 MED ORDER — RISPERIDONE 1 MG PO TABS: ORAL_TABLET | ORAL | Status: DC

## 2012-01-28 NOTE — Progress Notes (Signed)
Chief complaint I'm having panic attack  History of present illness Patient is 51 year old Caucasian female who came for her followup appointment. She has been notice more anger agitation and mood swings. She had argument with her son and soon after that she became very angry and has an impulse to kill herself however she realizes her thinking and able to calm herself. She has notice poor sleep easily irritable and angry. She does not want to increase her lithium however willing to try increase Risperdal. She told last week she visited ER believing she having a panic attack but her blood work and cardiac enzymes were negative. She denies any side effects of medication. She is happy as she lost weight. She denies any active or passive suicidal thoughts at this time but realize medicine yesterday adjusted.   Past psychiatric history Patient has significant history of bipolar disorder with at least 4 psychiatric inpatient treatment. Patient has a history of suicidal attempt however history of passive suicidal thinking. In the past she had tried numerous psychotropic medication including Zyprexa Seroquel but she does not want to take any medication that cause weight gain.  Medical history Obesity, hypertension, brain tumor and urinary tract infection  Psychosocial history Patient is a high school graduate. She is married lives with her husband who is supportive. She has 2 children. Patient is on disability since 2000 due to brain tumor.  Family history Patient denies any history of any psychiatric illness  Alcohol and substance abuse history Patient has a history of alcohol substance use  Mental status examination Patient is casually dressed and fairly groomed. She maintained fair eye contact. Her thought processes circumstantial and easily distracted. She denies any active or passive suicidal thoughts or homicidal thoughts. She denies any auditory or visual hallucination. She described her mood is  good and her affect is labile. She's alert and oriented x3. Her insight judgment and impulse control is fair  Assessment Axis I bipolar disorder Axis II deferred Axis III see medical history Axis IV moderate Axis V 55-60  Plan At this time I believe patient is been slowly decompensating. Her lithium has been reduced 2 months ago due to the side effects. Patient is willing to try Risperdal 1.5 mg. I have explained risks and benefits of medication. We also talked about safety plan that in case patient started to feel worsening of her symptoms or any time having suicidal thoughts and homicidal thoughts and she need to call 911 to local ER. I will see her again in 3 weeks. We also do blood work including lithium level which has not done in the ER. I review her other labs she has a normal CBC and chemistry. Time spent 30 minutes

## 2012-01-29 LAB — LITHIUM LEVEL: Lithium Lvl: 0.49 mEq/L — ABNORMAL LOW (ref 0.80–1.40)

## 2012-02-25 ENCOUNTER — Ambulatory Visit (INDEPENDENT_AMBULATORY_CARE_PROVIDER_SITE_OTHER): Payer: PRIVATE HEALTH INSURANCE | Admitting: Psychiatry

## 2012-02-25 ENCOUNTER — Encounter (HOSPITAL_COMMUNITY): Payer: Self-pay | Admitting: Psychiatry

## 2012-02-25 DIAGNOSIS — F319 Bipolar disorder, unspecified: Secondary | ICD-10-CM

## 2012-02-25 MED ORDER — RISPERIDONE 2 MG PO TABS: 2.0000 mg | ORAL_TABLET | Freq: Every day | ORAL | Status: DC

## 2012-02-25 NOTE — Progress Notes (Signed)
Chief complaint I'm still having panic attack  History of present illness Patient is 51 year old Caucasian female who came for her followup appointment. On her last visit we have increased the Risperdal to 1.5 mg. Though patient has been showing some improvement in her paranoia but she continues to have nervousness and feeling scared sometimes. She sleeps fine however she has problem falling asleep. She continued to have agitation anger and some mood swing but they are less intense and less frequent from past. She denies any hallucination but endorse paranoia and difficult to be around people. She likes increase Risperdal. She is very excited as she lost some weight.  Her lithium level is 0.49.  Current psychiatric medication Risperdal 1.5 mg at bedtime Lithium carbonate 450 mg twice a day Wellbutrin SR 150 mg twice a day Klonopin 1.5 mg at bedtime  Past psychiatric history Patient has significant history of bipolar disorder with at least 4 psychiatric inpatient treatment. Patient has a history of suicidal attempt however history of passive suicidal thinking. In the past she had tried numerous psychotropic medication including Zyprexa Seroquel but she does not want to take any medication that cause weight gain.  Medical history Obesity, hypertension, brain tumor and urinary tract infection  Psychosocial history Patient is a high school graduate. She is married lives with her husband who is supportive. She has 2 children. Patient is on disability since 2000 due to brain tumor.  Family history Patient denies any history of any psychiatric illness  Alcohol and substance abuse history Patient has a history of alcohol substance use  Mental status examination Patient is casually dressed and fairly groomed. She maintained fair eye contact. Her thought processes is logical and linear. She endorse some paranoia but denies any auditory or visual hallucination. Her speech is fast but clear and  coherent. She denies any active or passive suicidal thoughts or homicidal thoughts. She denies any auditory or visual hallucination. She described her mood is good and her affect is labile. She's alert and oriented x3. Her insight judgment and impulse control is fair  Assessment Axis I bipolar disorder Axis II deferred Axis III see medical history Axis IV moderate Axis V 55-60  Plan I will increase her Risperdal to 2 mg. Patient is reporting no side effects of medication. I talked to her increasing her lithium however in the past she complained of nausea with increased lithium. I discussed with her if increased Risperdal did not help her we may need to consider increasing lithium again. I have explained risks and benefits of medication. We also talked about safety plan that in case patient started to feel worsening of her symptoms or any time having suicidal thoughts and homicidal thoughts and she need to call 911 to local ER.  I will see her again in 4 weeks.  Time spent 30 minutes

## 2012-03-09 ENCOUNTER — Telehealth (HOSPITAL_COMMUNITY): Payer: Self-pay | Admitting: *Deleted

## 2012-03-10 ENCOUNTER — Encounter (HOSPITAL_COMMUNITY): Payer: Self-pay | Admitting: Psychiatry

## 2012-03-10 ENCOUNTER — Ambulatory Visit (INDEPENDENT_AMBULATORY_CARE_PROVIDER_SITE_OTHER): Payer: PRIVATE HEALTH INSURANCE | Admitting: Psychiatry

## 2012-03-10 DIAGNOSIS — F319 Bipolar disorder, unspecified: Secondary | ICD-10-CM

## 2012-03-10 MED ORDER — LITHIUM CARBONATE 300 MG PO TABS: ORAL_TABLET | ORAL | Status: DC

## 2012-03-10 NOTE — Progress Notes (Signed)
Chief complaint I have bad anxiety attack   History of present illness Patient is 51 year old Caucasian female who came for her followup appointment. She was last seen 2 weeks ago and her Risperdal was increased. She recently visited her son to see a new grand baby however she has to come home early as she was very upset when people are talking about her demeanor. She felt very bad and ask her husband to bring her home without holding the baby. Patient admitted lately she is feeling more depressed tearful and having paranoid thinking. She sleeps fine but last week she felt that she's been hearing voices and somebody is telling her to kill herself however she reported her thoughts to her husband who was able to calm her down. She denies any active or passive suicidal thinking and felt much better now. She is willing to take more lithium which had helped her in the past. She is taking Risperdal 2 mg. She denies any side effects of medication. She denies any tremors or shakes. She admitted getting easily sensitive around people and cannot take their comments. She denies any aggression but admitted easily irritable and frustrated. Currently she denies any active or passive suicidal thinking. Her last lithium level is 0.49.  Current psychiatric medication Risperdal 2 mg at bedtime Lithium carbonate 450 mg twice a day Wellbutrin SR 150 mg twice a day Klonopin 1.5 mg at bedtime  Past psychiatric history Patient has significant history of bipolar disorder with at least 4 psychiatric inpatient treatment. Patient has a history of suicidal attempt however history of passive suicidal thinking. In the past she had tried numerous psychotropic medication including Zyprexa Seroquel but she does not want to take any medication that cause weight gain.  Medical history Obesity, hypertension, brain tumor and urinary tract infection  Psychosocial history Patient is a high school graduate. She is married lives with  her husband who is supportive. She has 2 children. Patient is on disability since 2000 due to brain tumor.  Family history Patient denies any history of any psychiatric illness  Alcohol and substance abuse history Patient has a history of alcohol substance use  Mental status examination Patient is casually dressed and fairly groomed. She she's anxious and tearful but maintained good eye contact. She described her mood is anxious and her affect is constricted. She denies any active or passive suicidal thinking and homicidal thinking. There were no auditory or visual hallucination present at this time. There were no paranoia or delusion present. Her thought processes logical linear and goal-directed. She understand that she need more lithium. She's alert and oriented x3. Her insight judgment and impulse control is okay.  Assessment Axis I bipolar disorder Axis II deferred Axis III see medical history Axis IV moderate Axis V 55-60  Plan I reviewed her medication, previous history and past blood results including lithium level. She has low lithium level. Now she is willing to take higher dose of lithium which had helped her in the past. She is willing to take lithium 300 mg and morning and 9 her milligram at bedtime. I will continue her current dose of Risperdal and Wellbutrin. One more time I offered counseling however patient cannot afford for counseling at this time. We also talked in detail about safety plan that in case if she started to feel having suicidal thoughts and homicidal thoughts or increase in her paranoia that cannot be controlled than she need to call 911 or go to local emergency room. I will see  her again in 3 weeks. Time spent 30 minutes.

## 2012-03-24 ENCOUNTER — Ambulatory Visit (HOSPITAL_COMMUNITY): Payer: PRIVATE HEALTH INSURANCE | Admitting: Psychiatry

## 2012-03-26 ENCOUNTER — Encounter (HOSPITAL_COMMUNITY): Payer: Self-pay | Admitting: Psychiatry

## 2012-03-26 ENCOUNTER — Ambulatory Visit (INDEPENDENT_AMBULATORY_CARE_PROVIDER_SITE_OTHER): Payer: PRIVATE HEALTH INSURANCE | Admitting: Psychiatry

## 2012-03-26 DIAGNOSIS — F319 Bipolar disorder, unspecified: Secondary | ICD-10-CM

## 2012-03-26 MED ORDER — BUPROPION HCL ER (SR) 150 MG PO TB12: 150.0000 mg | ORAL_TABLET | Freq: Two times a day (BID) | ORAL | Status: DC

## 2012-03-26 MED ORDER — CLONAZEPAM 1 MG PO TABS: ORAL_TABLET | ORAL | Status: DC

## 2012-03-26 MED ORDER — LITHIUM CARBONATE 300 MG PO TABS: ORAL_TABLET | ORAL | Status: DC

## 2012-03-26 MED ORDER — RISPERIDONE 2 MG PO TABS: 2.0000 mg | ORAL_TABLET | Freq: Every day | ORAL | Status: DC

## 2012-03-26 NOTE — Progress Notes (Signed)
Chief complaint Medication management followup.     History of present illness Patient is 51 year old Caucasian female who came for her followup appointment.  She is taking lithium 600 mg twice a day.  She is doing better on her current dose.  She denies any agitation anger or mood swings.  She denies any panic or nervous attack he at she sleeping did.  She has some social isolation but he denies any crying spells or anger episode .  She visited her son and able to control her emotions.  She likes her current medication and dosage .  She denies any stomach problems or diarrhea with increased lithium .  She's not abusing her Klonopin or asking earlier he feels.  She denies any drinking or using any illegal substance.    Current psychiatric medication Risperdal 2 mg at bedtime Lithium carbonate 300 mg 2 tablet twice a day Wellbutrin SR 150 mg twice a day Klonopin 1.5 mg at bedtime  Past psychiatric history Patient has significant history of bipolar disorder with at least 4 psychiatric inpatient treatment. Patient has a history of suicidal attempt however history of passive suicidal thinking. In the past she had tried numerous psychotropic medication including Zyprexa Seroquel but she does not want to take any medication that cause weight gain.  Medical history Obesity, hypertension, brain tumor and urinary tract infection  Psychosocial history Patient is a high school graduate. She is married lives with her husband who is supportive. She has 2 children. Patient is on disability since 2000 due to brain tumor.  Family history Patient denies any history of any psychiatric illness  Alcohol and substance abuse history Patient has a history of alcohol substance use  Mental status examination Patient is casually dressed and fairly groomed. She she is pleasant , cooperative and maintained good eye contact.  Her speech is fluent and clear.  Her thought processes organized and logical.  She denies  any active or passive suicidal thinking and homicidal thinking.  She described her mood is anxious however her affect is improved from the past.  She denies any auditory or visual hallucination.  There were no paranoia or any psychotic symptoms present at this time.  Her attention and concentration is fair.  She's alert and oriented x3.  Her insight judgment and impulse control is okay.  Assessment Axis I bipolar disorder Axis II deferred Axis III see medical history Axis IV moderate Axis V 55-60  Plan Patient is doing much better on her current dose of medication.  She liked increase lithium and tolerating without any side effects.  I will continue lithium 600 mg twice a day along with Wellbutrin 3 her milligram daily, Risperdal 2 mg at bedtime and Klonopin 1.5 mg at bedtime .  I explained risks and benefits of medication .  I recommended to call us if she feels worsening of symptoms or having any time suicidal thoughts or homicidal thoughts.  I discussed one more time safety plan .  I will see her again in 6 weeks.

## 2012-03-31 ENCOUNTER — Ambulatory Visit (HOSPITAL_COMMUNITY): Payer: Self-pay | Admitting: Psychiatry

## 2012-04-14 ENCOUNTER — Telehealth (HOSPITAL_COMMUNITY): Payer: Self-pay | Admitting: *Deleted

## 2012-04-14 ENCOUNTER — Other Ambulatory Visit (HOSPITAL_COMMUNITY): Payer: Self-pay | Admitting: Psychiatry

## 2012-04-14 NOTE — Progress Notes (Signed)
Call return to patient who is concerned about numbness in her fingers.  Patient told she has noticed a day that her fingers are numb.  She denies any other symptoms.  I recommended to see primary care physician or neurologist and possible not conduction studies.  Patient acknowledged.

## 2012-05-05 ENCOUNTER — Encounter (HOSPITAL_COMMUNITY): Payer: Self-pay | Admitting: Psychiatry

## 2012-05-05 ENCOUNTER — Ambulatory Visit (INDEPENDENT_AMBULATORY_CARE_PROVIDER_SITE_OTHER): Payer: PRIVATE HEALTH INSURANCE | Admitting: Psychiatry

## 2012-05-05 VITALS — Wt 157.0 lb

## 2012-05-05 DIAGNOSIS — F319 Bipolar disorder, unspecified: Secondary | ICD-10-CM

## 2012-05-05 MED ORDER — BUPROPION HCL ER (SR) 150 MG PO TB12: 150.0000 mg | ORAL_TABLET | Freq: Two times a day (BID) | ORAL | Status: DC

## 2012-05-05 MED ORDER — RISPERIDONE 2 MG PO TABS: 2.0000 mg | ORAL_TABLET | Freq: Every day | ORAL | Status: DC

## 2012-05-05 MED ORDER — LITHIUM CARBONATE 300 MG PO TABS: ORAL_TABLET | ORAL | Status: DC

## 2012-05-05 MED ORDER — CLONAZEPAM 1 MG PO TABS: ORAL_TABLET | ORAL | Status: DC

## 2012-05-05 NOTE — Progress Notes (Signed)
Chief complaint Medication management followup.     History of present illness Patient is 51 year old Caucasian female who came for her followup appointment.  She is compliant with her medication and doing better .  She has lost 10 more pounds in past few weeks.  She is doing weight watcher and very happy about her losing weight.  She feels sometimes sluggish but denies any tremors or shakes.  She sleeping better .  She denies any recent crying spells or any mood swing.  She received a text from her son on Mother's Day and she was very happy about it.  She continues to have some family issues but overall she is handling better.  She denies any diarrhea or any stomach issue related to lithium.  She's not abusing her Klonopin or drinking alcohol.  She denies any active or passive suicidal thoughts.    Current psychiatric medication Risperdal 2 mg at bedtime Lithium carbonate 300 mg 2 tablet twice a day Wellbutrin SR 150 mg twice a day Klonopin 1.5 mg at bedtime  Past psychiatric history Patient has significant history of bipolar disorder with at least 4 psychiatric inpatient treatment. Patient has a history of suicidal attempt however history of passive suicidal thinking. In the past she had tried numerous psychotropic medication including Zyprexa Seroquel but she does not want to take any medication that cause weight gain.  Medical history Obesity, hypertension, brain tumor and urinary tract infection  Psychosocial history Patient is a high school graduate. She is married lives with her husband who is supportive. She has 2 children. Patient is on disability since 2000 due to brain tumor.  Her primary care physician is Alinda Deem in [place].  Family history Patient denies any history of any psychiatric illness  Alcohol and substance abuse history Patient has a history of alcohol substance use  Mental status examination Patient is casually dressed and fairly groomed. She is  pleasant , cooperative and maintained good eye contact.  Her speech is fluent and clear.  Her thought processes organized and logical.  She denies any active or passive suicidal thinking and homicidal thinking.  She described her mood is neutral and her affect is mood congruent.  She denies any auditory or visual hallucination.  There were no paranoia or any psychotic symptoms present at this time.  Her attention and concentration is fair.  She's alert and oriented x3.  Her insight judgment and impulse control is okay.  Assessment Axis I bipolar disorder Axis II deferred Axis III see medical history Axis IV moderate Axis V 55-60  Plan Patient is doing much better on her current dose of medication.  I will do lithium level .  I remind if she have any side effects of lithium and she need to call us immediately.  Patient likes current dose of lithium along with her medication.  I explained risks and benefits of medication in detail.  I will see her again in 2 months.

## 2012-05-06 LAB — LITHIUM LEVEL: Lithium Lvl: 0.77 mEq/L — ABNORMAL LOW (ref 0.80–1.40)

## 2012-07-07 ENCOUNTER — Encounter (HOSPITAL_COMMUNITY): Payer: Self-pay | Admitting: Psychiatry

## 2012-07-07 ENCOUNTER — Ambulatory Visit (INDEPENDENT_AMBULATORY_CARE_PROVIDER_SITE_OTHER): Payer: PRIVATE HEALTH INSURANCE | Admitting: Psychiatry

## 2012-07-07 VITALS — BP 105/74 | HR 78 | Wt 149.0 lb

## 2012-07-07 DIAGNOSIS — F319 Bipolar disorder, unspecified: Secondary | ICD-10-CM

## 2012-07-07 MED ORDER — LITHIUM CARBONATE 300 MG PO TABS: ORAL_TABLET | ORAL | Status: DC

## 2012-07-07 MED ORDER — BENZTROPINE MESYLATE 1 MG PO TABS: 1.0000 mg | ORAL_TABLET | Freq: Every day | ORAL | Status: DC

## 2012-07-07 MED ORDER — BUPROPION HCL ER (SR) 150 MG PO TB12: 150.0000 mg | ORAL_TABLET | Freq: Two times a day (BID) | ORAL | Status: DC

## 2012-07-07 MED ORDER — RISPERIDONE 2 MG PO TABS: 2.0000 mg | ORAL_TABLET | Freq: Every day | ORAL | Status: DC

## 2012-07-07 MED ORDER — CLONAZEPAM 1 MG PO TABS: ORAL_TABLET | ORAL | Status: DC

## 2012-07-07 NOTE — Progress Notes (Signed)
Chief complaint I still feel restless am doing better on her medication.       History of present illness Patient is 51 year old Caucasian female who came for her followup appointment.  She is compliant with her medication and doing better .  Patient endorse sometimes feel restless and anxious and wished to keep moving .  She denies any recent agitation anger mood swing however she continued to feel sometime sad and depressive thoughts.  She likes her current dose of Risperdal and Klonopin.  Some night she cannot sleep very well but most of the night she is sleeping fine.  She denies any recent paranoia and hallucination or any anger episode.  She continues to have some family issues but they have been not intense in recent weeks.  She has some tremors but denies any stiffness .  She's not drinking or using any illegal substance.  She's not abusing her Klonopin.  She denies any active or passive suicidal thoughts.  She is more involved in her daily life.  She has lost 8 pounds from her last visit.  She is on weight watcher and watching her calorie intake.  Current psychiatric medication Risperdal 2 mg at bedtime Lithium carbonate 300 mg 2 tablet twice a day Wellbutrin SR 150 mg twice a day Klonopin 1.5 mg at bedtime  Past psychiatric history Patient has significant history of bipolar disorder with at least 4 psychiatric inpatient treatment. Patient has a history of suicidal attempt however history of passive suicidal thinking. In the past she had tried numerous psychotropic medication including Zyprexa Seroquel but she does not want to take any medication that cause weight gain.  Medical history Obesity, hypertension, brain tumor and urinary tract infection  Psychosocial history Patient is a high school graduate. She is married lives with her husband who is supportive. She has 2 children. Patient is on disability since 2000 due to brain tumor.  Her primary care physician is Alinda Deem in  [place].  Family history Patient denies any history of any psychiatric illness  Alcohol and substance abuse history Patient has a history of alcohol substance use  Mental status examination Patient is casually dressed and fairly groomed. She is pleasant , cooperative and maintained good eye contact.  Her speech is fluent and clear.  She appears sometime restless and keep changing her posture.  However her thoughts her organized and logical.  She denies any active or passive suicidal thinking and homicidal thinking.  She described her mood is neutral and her affect is mood congruent.  She denies any auditory or visual hallucination.  There were no paranoia or any psychotic symptoms present at this time.  Her attention and concentration is fair.  She's alert and oriented x3.  Her insight judgment and impulse control is okay.  Assessment Axis I bipolar disorder Axis II deferred Axis III see medical history Axis IV moderate Axis V 55-60  Plan I review her medication, last progress note and medication.  I do believe patient may have a Akathisia due to Risperdal.  I recommend to try Cogentin 0.5 mg and may repeat one more time if needed.  I explained risks and benefits of medication especially dry mouth and urinary retention .  I recommend to call us if she is any question or concern about the medication or if she feels worsening of the symptoms.  I recommend to continue her regular exercise and keep watching her calorie intake.  I will see her again in 2 months .  Time spent 30 minutes.    Portion of this note is generated with voice dictation software and may contain typographical error.

## 2012-07-14 ENCOUNTER — Telehealth (HOSPITAL_COMMUNITY): Payer: Self-pay | Admitting: *Deleted

## 2012-07-14 NOTE — Telephone Encounter (Signed)
Patient complain of taking Cogentin making her very sleepy.  She's been sleeping for 13 hours with Cogentin.  Recommend to stop Cogentin .  Call us back if she still has concern about the medication.

## 2012-09-08 ENCOUNTER — Ambulatory Visit (INDEPENDENT_AMBULATORY_CARE_PROVIDER_SITE_OTHER): Payer: PRIVATE HEALTH INSURANCE | Admitting: Psychiatry

## 2012-09-08 ENCOUNTER — Encounter (HOSPITAL_COMMUNITY): Payer: Self-pay | Admitting: Psychiatry

## 2012-09-08 DIAGNOSIS — F319 Bipolar disorder, unspecified: Secondary | ICD-10-CM

## 2012-09-08 MED ORDER — RISPERIDONE 2 MG PO TABS: 2.0000 mg | ORAL_TABLET | Freq: Every day | ORAL | Status: DC

## 2012-09-08 MED ORDER — HYDROXYZINE PAMOATE 25 MG PO CAPS: 25.0000 mg | ORAL_CAPSULE | Freq: Every day | ORAL | Status: DC

## 2012-09-08 MED ORDER — LITHIUM CARBONATE 300 MG PO TABS: ORAL_TABLET | ORAL | Status: DC

## 2012-09-08 MED ORDER — BENZTROPINE MESYLATE 1 MG PO TABS: 1.0000 mg | ORAL_TABLET | Freq: Every day | ORAL | Status: DC

## 2012-09-08 MED ORDER — CLONAZEPAM 1 MG PO TABS
ORAL_TABLET | ORAL | Status: DC
Start: 2012-09-08 — End: 2012-11-13

## 2012-09-08 NOTE — Progress Notes (Signed)
Chief complaint I am feeling better.         History of present illness Patient is 51 year old Caucasian female who came for her followup appointment.  She is compliant with her medication and doing better .  On her last visit we tried Cogentin but she complaint off excessive sleep and she was recommended to stop Cogentin.  She still has some tremors but she scared to take any other medication.  There are some times when she has some insomnia but otherwise her mood has been better.  She just came from beach with her husband and had a good time.  She's recently realized that her mother is coming to see therapist in this office.  She does not get along very well with her and like to be a schedule those days when her mother is not seeing therapist .  She denies any recent paranoia and hallucination or any mood swing.  She is complying with the medication drinking.  She's not drinking or using any illegal substance.  She still taking Klonopin as prescribed and never ask for early refills.  Current psychiatric medication Risperdal 2 mg at bedtime Lithium carbonate 300 mg 2 tablet twice a day Wellbutrin SR 150 mg twice a day Klonopin 1.5 mg at bedtime  Past psychiatric history Patient has significant history of bipolar disorder with at least 4 psychiatric inpatient treatment. Patient has a history of suicidal attempt however history of passive suicidal thinking. In the past she had tried numerous psychotropic medication including Zyprexa Seroquel but she does not want to take any medication that cause weight gain.  Medical history Obesity, hypertension, brain tumor and urinary tract infection  Psychosocial history Patient is a high school graduate. She is married lives with her husband who is supportive. She has 2 children. Patient is on disability since 2000 due to brain tumor.  Her primary care physician is Alinda Deem in [place].  Family history Patient denies any history of any  psychiatric illness  Alcohol and substance abuse history Patient has a history of alcohol substance use  Mental status examination Patient is casually dressed and fairly groomed. She is pleasant , cooperative and maintained good eye contact.  Her speech is fluent and clear.  Her thoughts her organized and logical.  She denies any active or passive suicidal thinking and homicidal thinking.  She described her mood is neutral and her affect is mood congruent.  She denies any auditory or visual hallucination.  There were no paranoia or any psychotic symptoms present at this time.  Her attention and concentration is fair.  She's alert and oriented x3.  Her insight judgment and impulse control is okay.  Assessment Axis I bipolar disorder Axis II deferred Axis III see medical history Axis IV moderate Axis V 55-60  Plan I will discontinue Cogentin since  She i I recommend to try Vistaril 25 mg as needed for insomnia and anxiety symptoms.  I will continue all her other current psychiatric medication.  I recommend to call us if she feels worsening of the symptom.  I will see her again in 2 months.  Portion of this note is generated with voice dictation software and may contain typographical error.

## 2012-11-05 ENCOUNTER — Encounter (HOSPITAL_COMMUNITY): Payer: Self-pay | Admitting: Psychiatry

## 2012-11-05 ENCOUNTER — Ambulatory Visit (INDEPENDENT_AMBULATORY_CARE_PROVIDER_SITE_OTHER): Payer: PRIVATE HEALTH INSURANCE | Admitting: Psychiatry

## 2012-11-05 VITALS — BP 102/66 | HR 80 | Ht 62.5 in | Wt 148.0 lb

## 2012-11-05 DIAGNOSIS — G939 Disorder of brain, unspecified: Secondary | ICD-10-CM | POA: Insufficient documentation

## 2012-11-05 DIAGNOSIS — F319 Bipolar disorder, unspecified: Secondary | ICD-10-CM

## 2012-11-05 MED ORDER — RISPERIDONE 2 MG PO TABS: 2.0000 mg | ORAL_TABLET | Freq: Every day | ORAL | Status: DC

## 2012-11-05 MED ORDER — LITHIUM CARBONATE 300 MG PO TABS: ORAL_TABLET | ORAL | Status: DC

## 2012-11-05 MED ORDER — BUPROPION HCL ER (SR) 150 MG PO TB12: 150.0000 mg | ORAL_TABLET | Freq: Two times a day (BID) | ORAL | Status: DC

## 2012-11-05 MED ORDER — CARBAMAZEPINE ER 200 MG PO TB12: ORAL_TABLET | ORAL | Status: DC

## 2012-11-05 NOTE — Patient Instructions (Addendum)
Shift to Tegretol for what I think is abnormal electrical activity in the Temporal lobe.    Look up on line Partial on set seizures. Partial Complex Seizures, and Chronic Traumatic Encephalopathy and see if any of this describes your symptoms.  Substitute Tegretol for the Klonopin.

## 2012-11-05 NOTE — Progress Notes (Signed)
Chief complaint I am holding my own.  I could be doing better and I could be doing worse.  I can go to sleep and if something wakes me up then I'm up.    History of present illness Patient is 51 year old Caucasian female who came for her followup appointment.  She is compliant with her medication and doing okay.  She has good days and some bad days.  Some days her thinking and memory are good and some days the brain doesn't work well at all. Discussed her symptoms after her surgery.  She now notes emotional instability, memory problems feeling of panic and learning problems.  She has no headaches.  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  Discussed the use of PDA's for her memory and for reminders.  Discussed the use of Tegretol for this.  Also discussed that she had had swelling as a reaction to other seizure medications.  Will see what reaction she has with Tegretol   Current psychiatric medication Risperdal 2 mg at bedtime Lithium carbonate 300 mg 2 tablet twice a day Wellbutrin SR 150 mg twice a day Klonopin 1.5 mg at bedtime will substitute with Tegretol  Past psychiatric history Patient has significant history of bipolar disorder with at least 4 psychiatric inpatient treatment. Patient has a history of suicidal attempt however history of passive suicidal thinking. In the past she had tried numerous psychotropic medication including Zyprexa Seroquel but she does not want to take any medication that cause weight gain.  Medical history Obesity, hypertension, brain tumor and urinary tract infection  Psychosocial history Patient is a high school graduate. She is married lives with her husband who is supportive. She has 2 children. Patient is on disability since 2000 due to brain tumor.  Her primary care physician is Alinda Deem in [place].  Family history Patient denies any history of any psychiatric illness  Alcohol and substance abuse history Patient has a  history of alcohol substance use  Mental status examination Patient is casually dressed and fairly groomed. She is pleasant , cooperative and maintained good eye contact.  Her speech is fluent and clear.  Her thoughts her organized and logical.  She denies any active or passive suicidal thinking and homicidal thinking.  She described her mood is neutral and her affect is mood congruent.  She denies any auditory or visual hallucination.  There were no paranoia or any psychotic symptoms present at this time.  Her attention and concentration is fair.  She's alert and oriented x3.  Her insight judgment and impulse control is okay.  Assessment Axis I bipolar disorder Axis II deferred Axis III see medical history Axis IV moderate Axis V 55-60  Plan Reviewed CC, tobacco/med/surg Hx, meds effects/ side effects, problem list, therapies and responses.  Also reviewed her symptoms.  I will discontinue Klonopin since it seem to me that she is having temporal lobe problems and Tegretol is more indicated for this. RTC 1 month.

## 2012-11-06 ENCOUNTER — Telehealth (HOSPITAL_COMMUNITY): Payer: Self-pay | Admitting: *Deleted

## 2012-11-06 NOTE — Telephone Encounter (Signed)
S/W pt.  She reported that the co pay on the XY is $50 and she had to chose the regular form of Tegretol.  I explained that she could take 1 to 2 at HS to see if that helps her get to sleep.  Ultimate goal is to take 3 total a day.  Informed pt that she could take 1/2 to 1 every AM and 1/2 to 1 every afternoon with the HS dose.  Also told pt that if she had time and no responsibilities now she could try 1/2 now and if nothing bad happens she could take the other 1/2 and then just take the Tegretol 1 TID and we will do something else for her insomnia.  She expressed appreciation for this information.

## 2012-11-10 ENCOUNTER — Telehealth (HOSPITAL_COMMUNITY): Payer: Self-pay | Admitting: *Deleted

## 2012-11-10 ENCOUNTER — Telehealth (HOSPITAL_COMMUNITY): Payer: Self-pay | Admitting: Psychiatry

## 2012-11-10 NOTE — Telephone Encounter (Signed)
Pt not tolerating Tegretol.  Will shift back to Klonopin.  She tried. Called Klonopin into [employer] this one time.

## 2012-11-13 ENCOUNTER — Encounter (HOSPITAL_COMMUNITY): Payer: Self-pay | Admitting: Psychiatry

## 2012-11-13 ENCOUNTER — Encounter (HOSPITAL_COMMUNITY): Payer: Self-pay | Admitting: *Deleted

## 2012-11-13 ENCOUNTER — Inpatient Hospital Stay (HOSPITAL_COMMUNITY)
Admission: EM | Admit: 2012-11-13 | Discharge: 2012-11-17 | DRG: 885 | Disposition: A | Discharge status: Home / Self Care | Payer: 59 | Source: Ambulatory Visit | Attending: Psychiatry | Admitting: Psychiatry

## 2012-11-13 ENCOUNTER — Ambulatory Visit (INDEPENDENT_AMBULATORY_CARE_PROVIDER_SITE_OTHER): Payer: PRIVATE HEALTH INSURANCE | Admitting: Psychiatry

## 2012-11-13 ENCOUNTER — Inpatient Hospital Stay (HOSPITAL_COMMUNITY): Admission: EM | Admit: 2012-11-13 | Payer: PRIVATE HEALTH INSURANCE | Source: Ambulatory Visit | Admitting: Psychiatry

## 2012-11-13 DIAGNOSIS — F339 Major depressive disorder, recurrent, unspecified: Secondary | ICD-10-CM | POA: Insufficient documentation

## 2012-11-13 DIAGNOSIS — F0634 Mood disorder due to known physiological condition with mixed features: Secondary | ICD-10-CM

## 2012-11-13 DIAGNOSIS — F319 Bipolar disorder, unspecified: Secondary | ICD-10-CM

## 2012-11-13 DIAGNOSIS — Z79899 Other long term (current) drug therapy: Secondary | ICD-10-CM

## 2012-11-13 DIAGNOSIS — F3132 Bipolar disorder, current episode depressed, moderate: Principal | ICD-10-CM

## 2012-11-13 DIAGNOSIS — I10 Essential (primary) hypertension: Secondary | ICD-10-CM | POA: Diagnosis present

## 2012-11-13 LAB — URINALYSIS, ROUTINE W REFLEX MICROSCOPIC
Bilirubin Urine: NEGATIVE
Glucose, UA: NEGATIVE mg/dL
Hgb urine dipstick: NEGATIVE
Ketones, ur: NEGATIVE mg/dL
Leukocytes, UA: NEGATIVE
Nitrite: NEGATIVE
Protein, ur: NEGATIVE mg/dL
Specific Gravity, Urine: 1.017 (ref 1.005–1.030)
Urobilinogen, UA: 0.2 mg/dL (ref 0.0–1.0)
pH: 6.5 (ref 5.0–8.0)

## 2012-11-13 LAB — RAPID URINE DRUG SCREEN, HOSP PERFORMED
Amphetamines: NOT DETECTED
Barbiturates: NOT DETECTED
Benzodiazepines: NOT DETECTED
Cocaine: NOT DETECTED
Opiates: NOT DETECTED
Tetrahydrocannabinol: NOT DETECTED

## 2012-11-13 LAB — CBC
HCT: 42.5 % (ref 36.0–46.0)
Hemoglobin: 14 g/dL (ref 12.0–15.0)
MCH: 30.9 pg (ref 26.0–34.0)
MCHC: 32.9 g/dL (ref 30.0–36.0)
MCV: 93.8 fL (ref 78.0–100.0)
Platelets: 258 10*3/uL (ref 150–400)
RBC: 4.53 MIL/uL (ref 3.87–5.11)
RDW: 12.3 % (ref 11.5–15.5)
WBC: 8.6 10*3/uL (ref 4.0–10.5)

## 2012-11-13 LAB — HEPATIC FUNCTION PANEL
ALT: 15 U/L (ref 0–35)
AST: 15 U/L (ref 0–37)
Albumin: 3.9 g/dL (ref 3.5–5.2)
Alkaline Phosphatase: 74 U/L (ref 39–117)
Bilirubin, Direct: 0.1 mg/dL (ref 0.0–0.3)
Indirect Bilirubin: 0.5 mg/dL (ref 0.3–0.9)
Total Bilirubin: 0.6 mg/dL (ref 0.3–1.2)
Total Protein: 6.7 g/dL (ref 6.0–8.3)

## 2012-11-13 LAB — LITHIUM LEVEL: Lithium Lvl: 0.75 mEq/L — ABNORMAL LOW (ref 0.80–1.40)

## 2012-11-13 LAB — PREGNANCY, URINE: Preg Test, Ur: NEGATIVE

## 2012-11-13 MED ORDER — ACETAMINOPHEN 325 MG PO TABS
650.0000 mg | ORAL_TABLET | Freq: Four times a day (QID) | ORAL | Status: DC | PRN
  Administered 2012-11-15 – 2012-11-16 (×2): 650 mg via ORAL

## 2012-11-13 MED ORDER — ACETAMINOPHEN 325 MG PO TABS: 650.0000 mg | ORAL_TABLET | Freq: Four times a day (QID) | ORAL | Status: DC | PRN

## 2012-11-13 MED ORDER — ALUM & MAG HYDROXIDE-SIMETH 200-200-20 MG/5ML PO SUSP: 30.0000 mL | ORAL | Status: DC | PRN

## 2012-11-13 MED ORDER — RISPERIDONE 0.25 MG PO TABS
0.2500 mg | ORAL_TABLET | Freq: Three times a day (TID) | ORAL | Status: DC | PRN
  Administered 2012-11-13 – 2012-11-15 (×5): 0.25 mg via ORAL
  Filled 2012-11-13 (×6): qty 1

## 2012-11-13 MED ORDER — MIRTAZAPINE 7.5 MG PO TABS
7.5000 mg | ORAL_TABLET | Freq: Every day | ORAL | Status: DC
  Administered 2012-11-13: 7.5 mg via ORAL
  Filled 2012-11-13 (×4): qty 1

## 2012-11-13 MED ORDER — ACYCLOVIR 800 MG PO TABS
800.0000 mg | ORAL_TABLET | Freq: Two times a day (BID) | ORAL | Status: DC
  Filled 2012-11-13 (×2): qty 1

## 2012-11-13 MED ORDER — MIRTAZAPINE 15 MG PO TABS: 7.5000 mg | ORAL_TABLET | Freq: Every day | ORAL | Status: DC

## 2012-11-13 MED ORDER — LISINOPRIL 2.5 MG PO TABS
2.5000 mg | ORAL_TABLET | Freq: Every day | ORAL | Status: DC
  Administered 2012-11-14: 08:00:00 via ORAL
  Administered 2012-11-15 – 2012-11-17 (×3): 2.5 mg via ORAL
  Filled 2012-11-13 (×7): qty 1

## 2012-11-13 MED ORDER — RISPERIDONE 0.25 MG PO TABS: 0.2500 mg | ORAL_TABLET | Freq: Three times a day (TID) | ORAL | Status: DC | PRN

## 2012-11-13 MED ORDER — MAGNESIUM HYDROXIDE 400 MG/5ML PO SUSP: 30.0000 mL | Freq: Every day | ORAL | Status: DC | PRN

## 2012-11-13 MED ORDER — LITHIUM CARBONATE 300 MG PO CAPS
600.0000 mg | ORAL_CAPSULE | ORAL | Status: DC
  Administered 2012-11-13 – 2012-11-17 (×8): 600 mg via ORAL
  Filled 2012-11-13 (×14): qty 2

## 2012-11-13 MED ORDER — CLONAZEPAM 1 MG PO TABS: ORAL_TABLET | ORAL | Status: DC

## 2012-11-13 NOTE — Progress Notes (Addendum)
Pt is admitted voluntarily to [facility] due to recent med change ( to tegretol ) resulting in nausea, vomiting, labile moods and change in mental status.Pt reports she sees Dr. Dan Humphreys (for Bipolar) , for which she has been taking klonopin. She says he recently decided to take her off the klonopin ( last week), put her on tegretol and she began to have labile moods, her depression got much worse and she developed intractible nausea with vomiting and began to halllucinate on the klonopin, stopped the tegretol yesterday and she is here now to get her mood stabilized. She is able to contract for safety. She is allergic to codeine, depakote, erythromycin and dilantin. 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 At this time, she is sad, tearful, anxious and able to contract for safety. After she is admitted , she was taken to the unit and oriented and then verbal report was given TT, RN, . Additionally, pt reported feeling so out of control this morning that she banged her head against brick wall in her home, resulting in Dr Dan Humphreys admitting her to the hospital.2 lttle pink abraisions at pt's forehead / hairline are evident.

## 2012-11-13 NOTE — Progress Notes (Signed)
Patient ID: [name], female   DOB: 01-13-1961, 51 y.o.   MRN: [id number] Pt tried Tegretol for the confusion and history of meningeoma.  That did not go well with lots of nausea.  Then when switched back to Klonopin her moods got more irritable with auditory hallucinations. Now is totally beside herself with bouts of tearfulness and attempting to harm herself.  She is afraid of actually attempting to kill herself.  Will admit for safety and for medications adjustments.  Consulted with Elana in [facility] with recc that she add 0.25 to 0.5 mg of Risperdal during the day and taper down on the Klonopin.  If admitted could stop the Klonopin.  Will add Remeron for the insomnia.

## 2012-11-13 NOTE — Patient Instructions (Signed)
Go to [address] [place], [place] and ask for [facility] in Assessment. I have arranged for your admission.
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
## 2012-11-14 DIAGNOSIS — F3132 Bipolar disorder, current episode depressed, moderate: Secondary | ICD-10-CM | POA: Diagnosis present

## 2012-11-14 DIAGNOSIS — F322 Major depressive disorder, single episode, severe without psychotic features: Secondary | ICD-10-CM

## 2012-11-14 LAB — COMPREHENSIVE METABOLIC PANEL
ALT: 16 U/L (ref 0–35)
AST: 18 U/L (ref 0–37)
Albumin: 3.9 g/dL (ref 3.5–5.2)
Alkaline Phosphatase: 73 U/L (ref 39–117)
BUN: 7 mg/dL (ref 6–23)
CO2: 25 mEq/L (ref 19–32)
Calcium: 10.1 mg/dL (ref 8.4–10.5)
Chloride: 102 mEq/L (ref 96–112)
Creatinine, Ser: 0.95 mg/dL (ref 0.50–1.10)
GFR calc Af Amer: 80 mL/min — ABNORMAL LOW (ref >90)
GFR calc non Af Amer: 69 mL/min — ABNORMAL LOW (ref >90)
Glucose, Bld: 94 mg/dL (ref 70–99)
Potassium: 3.8 mEq/L (ref 3.5–5.1)
Sodium: 139 mEq/L (ref 135–145)
Total Bilirubin: 0.6 mg/dL (ref 0.3–1.2)
Total Protein: 6.8 g/dL (ref 6.0–8.3)

## 2012-11-14 LAB — HEMOGLOBIN A1C
Hgb A1c MFr Bld: 5 % (ref <5.7)
Mean Plasma Glucose: 97 mg/dL (ref <117)

## 2012-11-14 LAB — ETHANOL: Alcohol, Ethyl (B): <11 mg/dL (ref 0–11)

## 2012-11-14 LAB — TSH: TSH: 1.786 u[IU]/mL (ref 0.350–4.500)

## 2012-11-14 MED ORDER — RISPERIDONE 2 MG PO TABS
2.0000 mg | ORAL_TABLET | Freq: Every day | ORAL | Status: DC
  Administered 2012-11-14 – 2012-11-16 (×3): 2 mg via ORAL
  Filled 2012-11-14 (×7): qty 1

## 2012-11-14 MED ORDER — BUPROPION HCL ER (SR) 150 MG PO TB12: 150.0000 mg | ORAL_TABLET | Freq: Two times a day (BID) | ORAL | Status: DC

## 2012-11-14 MED ORDER — BUPROPION HCL ER (SR) 150 MG PO TB12
150.0000 mg | ORAL_TABLET | ORAL | Status: DC
  Administered 2012-11-15 – 2012-11-17 (×6): 150 mg via ORAL
  Filled 2012-11-14 (×10): qty 1

## 2012-11-14 MED ORDER — CLONAZEPAM 1 MG PO TABS
1.0000 mg | ORAL_TABLET | Freq: Every day | ORAL | Status: AC
  Administered 2012-11-14: 1 mg via ORAL
  Filled 2012-11-14: qty 1

## 2012-11-14 NOTE — Progress Notes (Signed)
[facility] Group Note  Date: 11/23//2013 Time: 0900  Group Topic/Focus: The focus of this group is to help the patients identify goals they want to work on, to introduce them to their Saturday Patient Workbooks and to help motivate them to begin to take the steps to develop healthier lives. IParticipation Level:  active Participation Quality: good Affect: flat Cognitive:    Insight:  good  Engagement in Group: engaged  Additional Comments:  PD RN [facility] 11/14/2012 1330
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
## 2012-11-14 NOTE — Progress Notes (Signed)
Psychoeducational Group Note  Date: 11/14/2012 Time: 1015  Group Topic/Focus:  Identifying Needs:   The focus of this group is to help patients identify their personal needs that have been historically problematic and identify healthy behaviors to address their needs.  Participation Level:  active Participation Quality: good Affect: flat Cognitive:    Insight:  good  Engagement in Group: engaged  Additional Comments:  PDuke RN BC 1330

## 2012-11-14 NOTE — BHH Counselor (Signed)
Adult Comprehensive Assessment  Patient ID: [name], female   DOB: [birth year], 51 y.o.   MRN: [id number]  Information Source:    Current Stressors:  Educational / Learning stressors: Yes, I don't understand things Employment / Job issues: Yes, working with other people, lose grasp of what I'm doing Family Relationships: Denies Surveyor, quantity / Lack of resources (include bankruptcy): Denies Housing / Lack of housing: Denies Physical health (include injuries & life threatening diseases): Denies Social relationships: Okay - denies Substance abuse: Denies Bereavement / Loss: Lost mother-in-law in 2005, still tearful over this, very painful  Living/Environment/Situation:  Living Arrangements: Spouse/significant other;Children Living conditions (as described by patient or guardian): Lives with husband and son, age 55 How long has patient lived in current situation?: Son just moved back home 2 years ago, living with 31 years in January What is atmosphere in current home: Comfortable;Supportive;Loving  Family History:  Marital status: Married Number of Years Married: 31  What types of issues is patient dealing with in the relationship?: None, we agree on everything, we're "in sync" Additional relationship information: N/A Does patient have children?: Yes How many children?: 2  How is patient's relationship with their children?: 25 and 29yo sons, the 25yo lives with them  Childhood History:  By whom was/is the patient raised?: Both parents Additional childhood history information: N/A Description of patient's relationship with caregiver when they were a child: Average, no abuse but we weren't close, did things together Patient's description of current relationship with people who raised him/her: Estranged the last 3 years Does patient have siblings?: Yes Number of Siblings: 1  (Brother) Description of patient's current relationship with siblings: Estranged 3 years Did patient suffer any  verbal/emotional/physical/sexual abuse as a child?: No Did patient suffer from severe childhood neglect?: No Has patient ever been sexually abused/assaulted/raped as an adolescent or adult?: No Was the patient ever a victim of a crime or a disaster?: No Witnessed domestic violence?: No Has patient been effected by domestic violence as an adult?: No  Education:  Highest grade of school patient has completed: 12th Currently a student?: No Learning disability?: No (Has cognitive and memory problems NOW, not then)  Employment/Work Situation:   Employment situation: On disability Why is patient on disability: Since 2001 or 2002 for cognitive and memory issues caused by a brain tumor, plus Bipolar Disorder How long has patient been on disability: 11-12 years Patient's job has been impacted by current illness: No What is the longest time patient has a held a job?: 6 years, I guess, but "I really can't remember" 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 Has patient ever been in the [employer]?: No Has patient ever served in combat?: No  Financial Resources:   Surveyor, quantity resources: Insurance underwriter;Income from spouse Does patient have a representative payee or guardian?: No  Alcohol/Substance Abuse:   What has been your use of drugs/alcohol within the last 12 months?: Denies If attempted suicide, did drugs/alcohol play a role in this?: No Alcohol/Substance Abuse Treatment Hx: Denies past history If yes, describe treatment: Denies Has alcohol/substance abuse ever caused legal problems?: No  Social Support System:   Conservation officer, nature Support System: Good Describe Community Support System: Husband, son that lives with them, other son, daughter-in-law, father-in-law Type of faith/religion: Baptist How does patient's faith help to cope with current illness?: Depend on the Lord to help me  Leisure/Recreation:   Leisure and Hobbies: Play with puppies  (3)  Strengths/Needs:   What things does  the patient do well?: "Nothing", but her husband says she does everything well In what areas does patient struggle / problems for patient: Understanding, reading, documents, things that have to be taken care of, one minute I understand it and the next I don't.  Discharge Plan:   Does patient have access to transportation?: Yes Will patient be returning to same living situation after discharge?: Yes Currently receiving community mental health services: Yes (From Whom) [facility] - Dr. Dan Humphreys) If no, would patient like referral for services when discharged?: No Does patient have financial barriers related to discharge medications?: No  Summary/Recommendations:   Summary and Recommendations (to be completed by the evaluator): This is a 51yo Caucasian who is admitted for suicidal ideation, labile mood, feels her medications were wrong and that caused her to become very emotional, feel like hurting herself.  She will benefit from psychoeducation, therapy groups, medication evaluation and monitoring, monitoring for safety, and linking to resources.  Sarina Ser. 11/14/2012

## 2012-11-14 NOTE — Clinical Social Work Note (Signed)
B[facility] Group Notes:  (Clinical Social Work)  11/14/2012  3:00-4:00PM  Summary of Progress/Problems:   The main focus of today's process group was for the patient to identify ways in which they have in the past sabotaged their own recovery and reasons they may have done this/what they received from doing it. We then worked to identify a specific plan of identifying self-sabotage statements/practicing following them with a replacement statement, in order to avoid doing this when discharged from the hospital for this admission. The patient expressed that she feels she has sabotaged herself by doing things which led to this hospitalization because she now feels that others are controlling her medications instead of herself.  Type of Therapy:  Group Therapy - Process  Participation Level:  Active  Participation Quality:  Attentive  Affect:  Depressed  Cognitive:  Alert and Oriented  Insight:  Limited  Engagement in Group:  Good  Engagement in Therapy:  Good  Modes of Intervention:  Clarification, Education, Limit-setting, Problem-solving, Socialization, Support and Processing   Ambrose Mantle, LCSW 11/14/2012, 5:22 PM

## 2012-11-14 NOTE — Progress Notes (Signed)
Psychoeducational Group Note  Date:  11/14/2012 Time: 1015  Group Topic/Focus:  Identifying Needs:   The focus of this group is to help patients identify their personal needs that have been historically problematic and identify healthy behaviors to address their needs.  Participation Level:  active Participation Quality: good Affect: flat Cognitive:    Insight:  good  Engagement in Group: engaged  Additional Comments: Pt was engaged in group, asked appropriate questions, shared personal life experience with the group and demonstrates willingness to process and understand her problems. PDuke RN [facility]

## 2012-11-14 NOTE — [facility] Suicide Risk Assessment (Signed)
Suicide Risk Assessment  Admission Assessment     Nursing information obtained from:  Patient Demographic factors:  Caucasian Current Mental Status:  See below Loss Factors:  Decline in physical health Historical Factors:   past SI attemtps Risk Reduction Factors:   wants to get better  CLINICAL FACTORS:   Depression:   Hopelessness  COGNITIVE FEATURES THAT CONTRIBUTE TO RISK:  Closed-mindedness    SUICIDE RISK:   Moderate:  Frequent suicidal ideation with limited intensity, and duration, some specificity in terms of plans, no associated intent, good self-control, limited dysphoria/symptomatology, some risk factors present, and identifiable protective factors, including available and accessible social support.  PLAN OF CARE: Mental Status Examination/Evaluation:  Objective: Appearance: Casual   Eye Contact:: Good   Speech: Clear and Coherent   Volume: Normal   Mood: Depressed   Affect: Congruent   Thought Process: Goal Directed   Orientation: Full   Thought Content:  AVH (commmand AH)  Suicidal Thoughts: No now  Homicidal Thoughts: No   Memory: Immediate; Fair   Judgement: Impaired   Insight: Lacking   Psychomotor Activity: Decreased   Concentration: Fair   Recall: Fair   Akathisia: No   Handed: Right   AIMS (if indicated):   Assets: Communication Skills  Desire for Improvement  Housing  Physical Health  Social Support   Sleep: Number of Hours: 5.75    Laboratory/X-Ray  Psychological Evaluation(s)      Assessment:  AXIS I: 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  HTN (hypertension)    .  UTI (lower urinary tract infection)    .  Mania    .  Bipolar disorder    .  Anxiety     AXIS IV: problems with access to health care services and problems with primary support group  AXIS V: 31-40 impairment in reality testing  Treatment Plan/Recommendations:  Treatment Plan  Summary:   Continue current meds Will check lipids later  Wonda Cerise 11/14/2012, 5:20 PM

## 2012-11-14 NOTE — Progress Notes (Signed)
Writer spoke with patient and informed her of scheduled medications. Patient attended group this evening and has been interacting appropriately with peers. Patient received medications and requested a prn dose of risperdal. Patient currently denies si/hi/a/v hallucinations. Support and encouragement offered, safety maintained with 15 min checks, will continue to monitor.

## 2012-11-14 NOTE — Progress Notes (Signed)
D slept poorly last nite, appetite is good, energy level is low and ability to pay attention is poor, +SI but contracts for safety, looks depressed and flat, lists hopelessness at 6/10, states she is lightheaded at times, when she goes home her changes she plans to make is to stay on the meds discharged with so she does not get into trouble w/meds, takes meds as ordered by MD and eating meals in the DR, attending group and participatinig. A q38min safety checks continue and support offered, encouraged to talk w/staff/peers and participate as best shecan and as often as she can R patient remains safe on the unit

## 2012-11-14 NOTE — H&P (Signed)
  Pt was seen by me today and I agree with the key elements documented in H&P. Also see my SI assessment note for additional plan recommendations. 

## 2012-11-15 DIAGNOSIS — F3132 Bipolar disorder, current episode depressed, moderate: Principal | ICD-10-CM

## 2012-11-15 MED ORDER — GUAIFENESIN-DM 100-10 MG/5ML PO SYRP
10.0000 mL | ORAL_SOLUTION | Freq: Four times a day (QID) | ORAL | Status: DC | PRN
  Administered 2012-11-15 – 2012-11-16 (×3): 10 mL via ORAL

## 2012-11-15 MED ORDER — CLONAZEPAM 1 MG PO TABS: 1.0000 mg | ORAL_TABLET | Freq: Once | ORAL | Status: DC

## 2012-11-15 MED ORDER — TRAZODONE HCL 50 MG PO TABS
50.0000 mg | ORAL_TABLET | Freq: Every evening | ORAL | Status: DC | PRN
  Administered 2012-11-15 (×2): 50 mg via ORAL
  Filled 2012-11-15: qty 1

## 2012-11-15 NOTE — Progress Notes (Signed)
Psychoeducational Group Note  Date:  11/15/2012 Time: 1015 Group Topic/Focus:  Making Healthy Choices:   The focus of this group is to help patients identify negative/unhealthy choices they were using prior to admission and identify positive/healthier coping strategies to replace them upon discharge.  Participation Level:  Active  Participation Quality:  Appropriate  Affect:  Appropriate  Cognitive:  Alert  Insight:  Good  Engagement in Group:  Good  Additional Comments:    [name] 5:54 PM. 11/15/2012

## 2012-11-15 NOTE — Progress Notes (Signed)
Goals Group Note  Date:  11/15/2012 Time:  0900  Group Topic/Focus:  Identifying Goals : The focus of this group is to help patients identify goals they want to achieve, it introduces them to their Sunday Patient Workbook  And helps motivate them into taking the steps they need to take..influenza order to become healthier individuals.  Participation Level:  Active  Participation Quality: good  Affect: flat  Cognitive:  good  Insight:  good  Engagement in Group: engaged  Additional Comments:    PD RN [facility] [id number]

## 2012-11-15 NOTE — Clinical Social Work Note (Signed)
[facility] Group Notes:  (Clinical Social Work)  11/15/2012   3:00-4:00PM  Summary of Progress/Problems:   The main focus of today's process group was for the patient to define "support" and describe what healthy supports are, then to identify the patient's current support system and decide on other supports that can be put in place to prevent future hospitalizations.  An emphasis was placed on using therapist, doctor and problem-specific support groups to expand supports. The patient expressed that her husband and children are her supports.  She was actively listening throughout all the group's discussion but did not participate actively much.  Type of Therapy:  Group Therapy  Participation Level:  Minimal  Participation Quality:  Attentive  Affect:  Blunted  Cognitive:  Oriented  Insight:  Good  Engagement in Group:  Good  Engagement in Therapy:  Good  Modes of Intervention:  Clarification, Education, Limit-setting, Problem-solving, Socialization, Support and Processing   Ambrose Mantle, LCSW 11/15/2012,

## 2012-11-15 NOTE — Progress Notes (Signed)
D patient states she slept fair lasst nite, appetite is good, energy level is normal and ability to pay attention is improving, depressed 4/10 and hopeless 5/10 today, denies Si or Hi today and no physical complaints noted, states she is ready to go home, taking meds as ordered by MD, going to group and going to DR for meals, is pleasant and cooperative and is more alert and talking to peers in the dayroom. A q15 min safety checks continue and support offered, encouraged to continue taking meds as ordered and enjoy group by participating R patient remains safe on the unit

## 2012-11-15 NOTE — Progress Notes (Signed)
[facility] MD Progress Note  11/15/2012 1:40 PM [name]  MRN:  [id number]  S: "I came in here 2 days ago (Fri). My medications were recently changed. The side effects got to me, I got so afraid that I thought I might hurt myself. I am feeling much better now. I want to be discharged in the morning. I have got to go home and get ready for thanksgiving".  ROS: Negative for fever.  HENT: Negative for congestion and rhinorrhea.  Respiratory: Negative for cough, chest tightness and shortness of breath.  Cardiovascular: Negative for chest pain.  Gastrointestinal: Negative for nausea, vomiting and abdominal pain.  Skin: Negative for rash.  Neurological: Negative for weakness and headaches   Diagnosis:   Axis I: Bipolar 1 disorder, depressed, moderate Axis II: Deferred Axis III:  Past Medical History  Diagnosis Date  . HTN (hypertension)   . UTI (lower urinary tract infection)   . Mania   . Bipolar disorder   . Anxiety    Axis IV: other psychosocial or environmental problems Axis V: 41-50 serious symptoms  ADL's:  Intact  Sleep: Good  Appetite:  Good  Suicidal Ideation:  Denies Homicidal Ideation:  Denies  AEB (as evidenced by): Per patient's reports  Mental Status Examination/Evaluation: Objective:  Appearance: Casual and Well Groomed  Eye Contact::  Good  Speech:  Clear and Coherent  Volume:  Normal  Mood:  Euthymic  Affect:  Appropriate and Congruent  Thought Process:  Coherent and Intact  Orientation:  Full  Thought Content:  Denies hallucinations  Suicidal Thoughts:  No  Homicidal Thoughts:  No  Memory:  Immediate;   Good Recent;   Good Remote;   Good  Judgement:  Fair  Insight:  Good  Psychomotor Activity:  Normal  Concentration:  Good  Recall:  Good  Akathisia:  No  Handed:  Right  AIMS (if indicated):     Assets:  Communication Skills Desire for Improvement  Sleep:  Number of Hours: 6.75    Vital Signs:Blood pressure 102/70, pulse 80,  temperature 97.5 F (36.4 C), temperature source Oral, resp. rate 15, height 5' 3.5" (1.613 m), weight 65.318 kg (144 lb), SpO2 99.00%. Current Medications: Current Facility-Administered Medications  Medication Dose Route Frequency Provider Last Rate Last Dose  . acetaminophen (TYLENOL) tablet 650 mg  650 mg Oral Q6H PRN Shuvon Rankin, NP   650 mg at 11/15/12 0821  . alum & mag hydroxide-simeth (MAALOX/MYLANTA) 200-200-20 MG/5ML suspension 30 mL  30 mL Oral Q4H PRN Shuvon Rankin, NP      . buPROPion (WELLBUTRIN SR) 12 hr tablet 150 mg  150 mg Oral [employer], PA-C   150 mg at 11/15/12 4098  . [COMPLETED] clonazePAM (KLONOPIN) tablet 1 mg  1 mg Oral QHS Verne Spurr, PA-C   1 mg at 11/14/12 2132  . guaiFENesin-dextromethorphan (ROBITUSSIN DM) 100-10 MG/5ML syrup 10 mL  10 mL Oral Q6H PRN Sanjuana Kava, NP   10 mL at 11/15/12 1251  . lisinopril (PRINIVIL,ZESTRIL) tablet 2.5 mg  2.5 mg Oral Daily Shuvon Rankin, NP   2.5 mg at 11/15/12 0821  . lithium carbonate capsule 600 mg  600 mg Oral BH-qamhs Shuvon Rankin, NP   600 mg at 11/15/12 0821  . magnesium hydroxide (MILK OF MAGNESIA) suspension 30 mL  30 mL Oral Daily PRN Shuvon Rankin, NP      . risperiDONE (RISPERDAL) tablet 0.25 mg  0.25 mg Oral TID PRN Shuvon Rankin, NP   0.25  mg at 11/14/12 2341  . risperiDONE (RISPERDAL) tablet 2 mg  2 mg Oral Daily Verne Spurr, PA-C   2 mg at 11/15/12 0865  . [DISCONTINUED] buPROPion (WELLBUTRIN SR) 12 hr tablet 150 mg  150 mg Oral BID Verne Spurr, PA-C      . [DISCONTINUED] mirtazapine (REMERON) tablet 7.5 mg  7.5 mg Oral QHS Shuvon Rankin, NP   7.5 mg at 11/13/12 2110    Lab Results:  Results for orders placed during the hospital encounter of 11/13/12 (from the past 48 hour(s))  HEPATIC FUNCTION PANEL     Status: Normal   Collection Time   11/13/12  5:02 PM      Component Value Range Comment   Total Protein 6.7  6.0 - 8.3 g/dL    Albumin 3.9  3.5 - 5.2 g/dL    AST 15  0 - 37 U/L    ALT  15  0 - 35 U/L    Alkaline Phosphatase 74  39 - 117 U/L    Total Bilirubin 0.6  0.3 - 1.2 mg/dL    Bilirubin, Direct 0.1  0.0 - 0.3 mg/dL    Indirect Bilirubin 0.5  0.3 - 0.9 mg/dL   URINALYSIS, ROUTINE W REFLEX MICROSCOPIC     Status: Abnormal   Collection Time   11/13/12  6:51 PM      Component Value Range Comment   Color, Urine YELLOW  YELLOW    APPearance CLOUDY (*) CLEAR    Specific Gravity, Urine 1.017  1.005 - 1.030    pH 6.5  5.0 - 8.0    Glucose, UA NEGATIVE  NEGATIVE mg/dL    Hgb urine dipstick NEGATIVE  NEGATIVE    Bilirubin Urine NEGATIVE  NEGATIVE    Ketones, ur NEGATIVE  NEGATIVE mg/dL    Protein, ur NEGATIVE  NEGATIVE mg/dL    Urobilinogen, UA 0.2  0.0 - 1.0 mg/dL    Nitrite NEGATIVE  NEGATIVE    Leukocytes, UA NEGATIVE  NEGATIVE MICROSCOPIC NOT DONE ON URINES WITH NEGATIVE PROTEIN, BLOOD, LEUKOCYTES, NITRITE, OR GLUCOSE <1000 mg/dL.  PREGNANCY, URINE     Status: Normal   Collection Time   11/13/12  6:51 PM      Component Value Range Comment   Preg Test, Ur NEGATIVE  NEGATIVE   URINE RAPID DRUG SCREEN (HOSP PERFORMED)     Status: Normal   Collection Time   11/13/12  6:51 PM      Component Value Range Comment   Opiates NONE DETECTED  NONE DETECTED    Cocaine NONE DETECTED  NONE DETECTED    Benzodiazepines NONE DETECTED  NONE DETECTED    Amphetamines NONE DETECTED  NONE DETECTED    Tetrahydrocannabinol NONE DETECTED  NONE DETECTED    Barbiturates NONE DETECTED  NONE DETECTED   CBC     Status: Normal   Collection Time   11/13/12  7:45 PM      Component Value Range Comment   WBC 8.6  4.0 - 10.5 K/uL    RBC 4.53  3.87 - 5.11 MIL/uL    Hemoglobin 14.0  12.0 - 15.0 g/dL    HCT 78.4  69.6 - 29.5 %    MCV 93.8  78.0 - 100.0 fL    MCH 30.9  26.0 - 34.0 pg    MCHC 32.9  30.0 - 36.0 g/dL    RDW 28.4  13.2 - 44.0 %    Platelets 258  150 - 400 K/uL   COMPREHENSIVE  METABOLIC PANEL     Status: Abnormal   Collection Time   11/13/12  7:45 PM      Component Value Range  Comment   Sodium 139  135 - 145 mEq/L    Potassium 3.8  3.5 - 5.1 mEq/L    Chloride 102  96 - 112 mEq/L    CO2 25  19 - 32 mEq/L    Glucose, Bld 94  70 - 99 mg/dL    BUN 7  6 - 23 mg/dL    Creatinine, Ser 4.09  0.50 - 1.10 mg/dL    Calcium 81.1  8.4 - 10.5 mg/dL    Total Protein 6.8  6.0 - 8.3 g/dL    Albumin 3.9  3.5 - 5.2 g/dL    AST 18  0 - 37 U/L    ALT 16  0 - 35 U/L    Alkaline Phosphatase 73  39 - 117 U/L    Total Bilirubin 0.6  0.3 - 1.2 mg/dL    GFR calc non Af Amer 69 (*) >90 mL/min    GFR calc Af Amer 80 (*) >90 mL/min   HEMOGLOBIN A1C     Status: Normal   Collection Time   11/13/12  7:45 PM      Component Value Range Comment   Hemoglobin A1C 5.0  <5.7 %    Mean Plasma Glucose 97  <117 mg/dL   TSH     Status: Normal   Collection Time   11/13/12  7:45 PM      Component Value Range Comment   TSH 1.786  0.350 - 4.500 uIU/mL   ETHANOL     Status: Normal   Collection Time   11/13/12  7:45 PM      Component Value Range Comment   Alcohol, Ethyl (B) <11  0 - 11 mg/dL   LITHIUM LEVEL     Status: Abnormal   Collection Time   11/13/12  7:45 PM      Component Value Range Comment   Lithium Lvl 0.75 (*) 0.80 - 1.40 mEq/L     Physical Findings: AIMS: Facial and Oral Movements Muscles of Facial Expression: None, normal Lips and Perioral Area: None, normal Jaw: None, normal Tongue: None, normal,Extremity Movements Upper (arms, wrists, hands, fingers): None, normal Lower (legs, knees, ankles, toes): None, normal, Trunk Movements Neck, shoulders, hips: None, normal, Overall Severity Severity of abnormal movements (highest score from questions above): None, normal Incapacitation due to abnormal movements: None, normal Patient's awareness of abnormal movements (rate only patient's report): No Awareness, Dental Status Current problems with teeth and/or dentures?: No Does patient usually wear dentures?: No  CIWA:  CIWA-Ar Total: 7  COWS:  COWS Total Score: 7   Treatment  Plan Summary: Daily contact with patient to assess and evaluate symptoms and progress in treatment Medication management  Plan: No changes made on the current treatment plan. Possible discharge in am per patient's request. Continue current treatment plan.  Armandina Stammer I 11/15/2012, 1:40 PM

## 2012-11-15 NOTE — Progress Notes (Signed)
Patient has been up in the dayroom interacting with peers. Patient reports to writer that her day has been better. Writer informed patient of medication changes and she was agreeable to taking them. Patient attended group this evening, voiced some concern about sleep this evening. Writer informed patient of prns available. Support and encouragement offered, safety maintained on unit with 15 min checks will continue to monitor.

## 2012-11-16 MED ORDER — CLONAZEPAM 1 MG PO TABS
1.0000 mg | ORAL_TABLET | Freq: Once | ORAL | Status: AC
  Administered 2012-11-16: 1 mg via ORAL
  Filled 2012-11-16: qty 1

## 2012-11-16 MED ORDER — RISPERIDONE 2 MG PO TABS
2.0000 mg | ORAL_TABLET | Freq: Every day | ORAL | Status: DC
  Filled 2012-11-16 (×2): qty 1

## 2012-11-16 NOTE — Tx Team (Signed)
Interdisciplinary Treatment Plan Update (Adult)  Date:  11/16/2012  Time Reviewed:  10:17 AM   Progress in Treatment: Attending groups:   Yes   Participating in groups:  Yes Taking medication as prescribed:  Yes Tolerating medication:  Yes Family/Significant othe contact made: Contact to be made with family Patient understands diagnosis:  Yes Discussing patient identified problems/goals with staff: Yes Medical problems stabilized or resolved: Yes Denies suicidal/homicidal ideation:Yes Issues/concerns per patient self-inventory:  Other:   New problem(s) identified:  Reason for Continuation of Hospitalization: Anxiety Depression Medication stabilization  Interventions implemented related to continuation of hospitalization:  Medication Management; safety checks q 15 mins  Additional comments:  Estimated length of stay:  1-2 days  Discharge Plan:  Home with outpatient follow up  New goal(s):  Review of initial/current patient goals per problem list:    1.  Goal(s): Eliminate SI/other thoughts of self harm   Met:  Yes  Target date: d/c  As evidenced by: Patient no longer endorsing SI/HI or other thoughts of self harm.    2.  Goal (s):Reduce depression/anxiety (rated at one today)  Met: Yes  Target date: d/c  As evidenced by: Patient currently rating symptoms at four or below    3.  Goal(s):.stabilize on meds   Met:  No  Target date: d/c  As evidenced by: Patient reports being stabilized on medications - less symptomatic    4.  Goal(s): Refer for outpatient follow up   Met: No  Target date: d/c  As evidenced by: Follow up appointment to be scheduled    Attendees: Patient:   11/16/2012 10:17 AM  Physican:  Patrick North, MD 11/16/2012 10:17 AM  Nursing:  Neill Loft, RN 11/16/2012 10:17 AM   Nursing:   Chinita Greenland, RN 11/16/2012 10:17 AM   Clinical Social Worker:  Juline Patch, LCSW 11/16/2012 10:17 AM   Other: Serena Colonel, FNP  11/16/2012 10:17 AM   Other: Norval Gable, FNP    11/16/2012 11/16/2012 Other:        11/16/2012 10:17 AM

## 2012-11-16 NOTE — Progress Notes (Signed)
D:  [name] reports that she slept ok and that her appetite is good.  Her energy level is normal and her ability to pay attention is improving.  She rates hopelessness and depression at 2/10.  She denies SI/HI/AVH at this time.  She is attending groups and interacting appropriately with staff and other patients.   A:  Medication administered as ordered.  Safety checks q 15 minutes.  Emotional support provided. R:  Safety maintained on unit.

## 2012-11-16 NOTE — Progress Notes (Signed)
Patient ID: [name], female   DOB: [birth year], 51 y.o.   MRN: [id number] [facility] MD Progress Note  11/16/2012 2:21 PM [name]  MRN:  [id number]  S: "My mood is great. I have no worries except going home to [employer] dinner. Im not depressed, having crying spells, or thoughts of suicide running around in my head." I didn't sleep well. The trazodone and remeron given to me in the past did not work. Klonopin and risperdal helps me sleep. I have racing thoughts which keep me from sleeping."  ROS: HEENT- + rhinorhea, + dry cough Resp: Negative SOB, neg wheezing, neg tachypnea Psych: + insomnia, + anxiety. - depression, - hallucinations All other systems negative  Diagnosis:   Axis I: Bipolar 1 disorder, depressed, moderate Axis II: Deferred Axis III:  Past Medical History  Diagnosis Date  . HTN (hypertension)   . UTI (lower urinary tract infection)   . Mania   . Bipolar disorder   . Anxiety    Axis IV: other psychosocial or environmental problems Axis V: moderate syptoms  ADL's:  Intact Fair hygiene  Sleep: Good "not sleeping well"  Appetite:  Good "Appetite is good. I lost 56 lbs with [employer], so I am being careful about my intake."  Suicidal Ideation:  Denies  Homicidal Ideation:  Denies  AEB (as evidenced by): Per patient's reports  Mental Status Examination/Evaluation: Objective:  Appearance: Casual and Well Groomed  Eye Contact::  Good  Speech:  Clear and Coherent  Volume:  Normal  Mood:  Euthymic, slight anxiety  Affect:  Blunted affect  Thought Process:  Linear, and Intact  Orientation:  Full  Thought Content:  Denies hallucinations. Negative for paranoia.  Suicidal Thoughts:  No  Homicidal Thoughts:  No  Memory:  Immediate;   Good Recent;   Good Remote;   Good  Judgement:  Fair  Insight:  Good  Psychomotor Activity:  Normal  Concentration:  Good  Recall:  Good  Akathisia:  No  Handed:  Right  AIMS (if indicated):     Assets:   Communication Skills Desire for Improvement  Sleep:  Number of Hours: 6     PE:  HEENT: clear rhinorrhea noted.  Resp: Dry cough without tachypnea or SOB noted.  Vital Signs:Blood pressure 117/79, pulse 96, temperature 98.4 F (36.9 C), temperature source Oral, resp. rate 18, height 5' 3.5" (1.613 m), weight 65.318 kg (144 lb), SpO2 99.00%.  Current Medications: Current Facility-Administered Medications  Medication Dose Route Frequency Provider Last Rate Last Dose  . acetaminophen (TYLENOL) tablet 650 mg  650 mg Oral Q6H PRN Shuvon Rankin, NP   650 mg at 11/16/12 0758  . alum & mag hydroxide-simeth (MAALOX/MYLANTA) 200-200-20 MG/5ML suspension 30 mL  30 mL Oral Q4H PRN Shuvon Rankin, NP      . buPROPion (WELLBUTRIN SR) 12 hr tablet 150 mg  150 mg Oral [employer], PA-C   150 mg at 11/16/12 1304  . clonazePAM (KLONOPIN) tablet 1 mg  1 mg Oral Once Wonda Cerise, MD      . guaiFENesin-dextromethorphan [facility] DM) 100-10 MG/5ML syrup 10 mL  10 mL Oral Q6H PRN Sanjuana Kava, NP   10 mL at 11/15/12 2135  . lisinopril (PRINIVIL,ZESTRIL) tablet 2.5 mg  2.5 mg Oral Daily Shuvon Rankin, NP   2.5 mg at 11/16/12 0757  . lithium carbonate capsule 600 mg  600 mg Oral BH-qamhs Shuvon Rankin, NP   600 mg at 11/16/12 0757  .  magnesium hydroxide (MILK OF MAGNESIA) suspension 30 mL  30 mL Oral Daily PRN Shuvon Rankin, NP      . risperiDONE (RISPERDAL) tablet 0.25 mg  0.25 mg Oral TID PRN Shuvon Rankin, NP   0.25 mg at 11/15/12 2135  . risperiDONE (RISPERDAL) tablet 2 mg  2 mg Oral Daily Verne Spurr, PA-C   2 mg at 11/16/12 0758  . traZODone (DESYREL) tablet 50 mg  50 mg Oral QHS PRN,MR X 1 Wonda Cerise, MD   50 mg at 11/15/12 2311    Lab Results:  No results found for this or any previous visit (from the past 48 hour(s)).  Physical Findings: AIMS: Facial and Oral Movements Muscles of Facial Expression: None, normal Lips and Perioral Area: None, normal Jaw: None, normal Tongue: None,  normal,Extremity Movements Upper (arms, wrists, hands, fingers): None, normal Lower (legs, knees, ankles, toes): None, normal, Trunk Movements Neck, shoulders, hips: None, normal, Overall Severity Severity of abnormal movements (highest score from questions above): None, normal Incapacitation due to abnormal movements: None, normal Patient's awareness of abnormal movements (rate only patient's report): No Awareness, Dental Status Current problems with teeth and/or dentures?: No Does patient usually wear dentures?: No  CIWA:  CIWA-Ar Total: 7  COWS:  COWS Total Score: 7   Treatment Plan Summary: Daily contact with patient to assess and evaluate symptoms and progress in treatment Medication management  Plan:  Give 1 mg Klonopin tonite x 1 dose only for insomnia. Move current risperdal 2 mg daily to PM administration starting 11/17/12 to assist with insomnia. TSH and Lithium level ordered for AM 11/17/12. Previous li level low at 0.75 on 11/13/12. Will recheck Gen Chem d/t reduced GFR. DC trazodone d/t ineffectiveness.   Norval Gable FNP-BC 11/16/2012, 2:21 PM

## 2012-11-16 NOTE — Progress Notes (Signed)
Patient ID: [name], female   DOB: 1961-09-24, 51 y.o.   MRN: [id number]   PER STATE REGULATIONS 482.30  THIS CHART WAS REVIEWED FOR MEDICAL NECESSITY WITH RESPECT TO THE PATIENT'S ADMISSION/ DURATION OF STAY.  11/25-11/27/2013  NEXT REVIEW DATE: 11/19/2012  Willa Rough, RN, BSN CASE MANAGER

## 2012-11-16 NOTE — Progress Notes (Signed)
[facility] Adult Inpatient Family/Significant Other Suicide Prevention Education  Suicide Prevention Education:  Education Completed; Kambri Dismore - Husband - [phone] has been identified by the patient as the family member/significant other with whom the patient will be residing, and identified as the person(s) who will aid the patient in the event of a mental health crisis (suicidal ideations/suicide attempt).  With written consent from the patient, the family member/significant other has been provided the following suicide prevention education, prior to the and/or following the discharge of the patient.  The suicide prevention education provided includes the following:  Suicide risk factors  Suicide prevention and interventions  National Suicide Hotline telephone number  [facility] assessment telephone number  [facility] Emergency Assistance 911  [facility] and/or Residential Mobile Crisis Unit telephone number  Request made of family/significant other to:  Remove weapons (e.g., guns, rifles, knives), all items previously/currently identified as safety concern. Husband is a Emergency planning/management officer, gun is secured.  Remove drugs/medications (over-the-counter, prescriptions, illicit drugs), all items previously/currently identified as a safety concern.  The family member/significant other verbalizes understanding of the suicide prevention education information provided.  The family member/significant other agrees to remove the items of safety concern listed above.  Wynn Banker 11/16/2012, 4:51 PM

## 2012-11-16 NOTE — Progress Notes (Addendum)
Psychoeducational Group Note  Date:  11/16/2012 Time:  1100  Group Topic/Focus:  Self Care:   The focus of this group is to help patients understand the importance of self-care in order to improve or restore emotional, physical, spiritual, interpersonal, and financial health.  Participation Level:  Minimal  Participation Quality:  Appropriate and Attentive  Affect:  Appropriate  Cognitive:  Appropriate  Insight:  Good  Engagement in Group:  Limited  Additional Comments:  Yelina attended and shared in group when asked to share,  Patient defined self care in her own terms. During group patient was asked to complete the self-care assessment in the areas of physical, emotional, psychological, spiritual, relationship care. Patient stated the strengths in areas and weakness. Patient also made a goal for weaknesses and ways to improve goal. Annamary stated her area of strengths of self care was physical. Being able to eat healthy, exercise and getting enough rest.Weaknesses that patient stated she would like to see improvement in is making time for self and spiritual care.  Karleen Hampshire Brittini 11/16/2012, 2:00 PM

## 2012-11-16 NOTE — Progress Notes (Signed)
Patient ID: [name], female   DOB: 20-Feb-1961, 51 y.o.   MRN: [id number]   PER STATE REGULATIONS 482.30  THIS CHART WAS REVIEWED FOR MEDICAL NECESSITY WITH RESPECT TO THE PATIENT'S ADMISSION/ DURATION OF STAY.  11/22-11/24/2013  NEXT REVIEW DATE: 11/16/2012  Willa Rough, RN, BSN CASE MANAGER

## 2012-11-16 NOTE — Clinical Social Work Note (Signed)
Discharge Planning Group 8:30-9:30 AM 11/16/2012  Patient seen during d/c planning group.  She advised of MD making changing med and it not being effective.  She stated he changed meds back and she went into a manic episode.  She currently denies SI/HI and rates symptoms at one.  She has home transportation and access to medications.  She is followed by [facility] outpatient clinic in [place].      [facility] Group Notes:  (Counselor/Nursing/MHT/Case Management/Adjunct)        Overcoming Obstacles         11/16/2012   1:15   Type of Therapy: Group  Participation Level:  Active  Participation Quality:  Attentive  Affect:  Appropriate  Cognitive:  Alert and Appropriate  Insight:  Good  Engagement in Group:  Good  Engagement in Therapy:  Good  Modes of Intervention:  Education, Problem-solving, Support and Exploration  Summary of Progress/Problems:  Patient shared her obstacle has been her biological family, especially her mother.  She reports she has learned to put herself and her needs first.   Wynn Banker 11/16/2012 3:42 PM

## 2012-11-16 NOTE — Progress Notes (Signed)
Report received from L.Stark Jock. Writer observed patient preparing for bed. Patient came to medication window and requested her repeat dose of trazadone which she received. Writer will monitor effectiveness of medication.

## 2012-11-17 LAB — LITHIUM LEVEL: Lithium Lvl: 0.6 mEq/L — ABNORMAL LOW (ref 0.80–1.40)

## 2012-11-17 LAB — BASIC METABOLIC PANEL
BUN: 11 mg/dL (ref 6–23)
CO2: 26 mEq/L (ref 19–32)
Calcium: 9.8 mg/dL (ref 8.4–10.5)
Chloride: 102 mEq/L (ref 96–112)
Creatinine, Ser: 0.77 mg/dL (ref 0.50–1.10)
GFR calc Af Amer: >90 mL/min (ref >90)
GFR calc non Af Amer: >90 mL/min (ref >90)
Glucose, Bld: 97 mg/dL (ref 70–99)
Potassium: 3.7 mEq/L (ref 3.5–5.1)
Sodium: 137 mEq/L (ref 135–145)

## 2012-11-17 LAB — TSH: TSH: 1.268 u[IU]/mL (ref 0.350–4.500)

## 2012-11-17 MED ORDER — BUPROPION HCL ER (SR) 150 MG PO TB12: 150.0000 mg | ORAL_TABLET | ORAL | Status: DC

## 2012-11-17 MED ORDER — LITHIUM CARBONATE 600 MG PO CAPS: 600.0000 mg | ORAL_CAPSULE | ORAL | Status: DC

## 2012-11-17 MED ORDER — LISINOPRIL 2.5 MG PO TABS: 2.5000 mg | ORAL_TABLET | Freq: Every day | ORAL | Status: DC

## 2012-11-17 MED ORDER — CLONAZEPAM 1 MG PO TABS: 1.5000 mg | ORAL_TABLET | Freq: Every evening | ORAL | Status: DC | PRN

## 2012-11-17 MED ORDER — RISPERIDONE 2 MG PO TABS: 2.0000 mg | ORAL_TABLET | Freq: Every day | ORAL | Status: DC

## 2012-11-17 NOTE — Clinical Social Work Note (Signed)
Discharge Planning Group 8:30-9:30 AM 11/17/2012  Patient seen during discharge planning group.  She reports being much improved and hopeful to discharge home today.  She denies SI/HI and rates all symptoms at zero.    [facility] Group Notes:  (Counselor/Nursing/MHT/Case Management/Adjunct)    11/17/2012   1:15   Type of Therapy: Group  Participation Level:  Active  Participation Quality:  Attentive  Affect:  Appropriate  Cognitive:  Alert and Appropriate  Insight:  Good  Engagement in Group:  Good  Engagement in Therapy:  Good  Modes of Intervention:  Education, Problem-solving, Support and Exploration  Summary of Progress/Problems:  Patient shared when she was first diagnosed with Bipolar she felt ashamed and that she was a failure.  She states she has accepted the diagnosis and does what is necessary to take care of herself.   Wynn Banker 11/17/2012 2:45 PM

## 2012-11-17 NOTE — Progress Notes (Signed)
Psychoeducational Group Note  Date:  11/17/2012 Time:  1000  Group Topic/Focus:  Recovery Goals:   The focus of this group is to identify appropriate goals for recovery and establish a plan to achieve them.  Participation Level:  Active  Participation Quality:  Appropriate, Sharing and Supportive  Affect:  Appropriate  Cognitive:  Appropriate  Insight:  Good  Engagement in Group:  Good  Additional Comments:  none  Sharline Lehane M 11/17/2012, 11:03 AM

## 2012-11-17 NOTE — Progress Notes (Signed)
Patient ID: [name], female   DOB: 1961-08-10, 51 y.o.   MRN: [id number] Patient denies SI/HI and A/V hallucinations; patient received prescriptions and copy of AVS after it was reviewed with the patient; patient had no concerns or questions at this time; patient was able to verbalize follow up appointments; patient verbalized that she received all belongings; patient left the unit ambulatory and was met in the lobby by her husband

## 2012-11-17 NOTE — Progress Notes (Signed)
[facility] Adult Inpatient Family/Significant Other Suicide Prevention Education  Suicide Prevention Education:  Education Completed; Leonides Sake, Significant other - [phone] has been identified by the patient as the family member/significant other with whom the patient will be residing, and identified as the person(s) who will aid the patient in the event of a mental health crisis (suicidal ideations/suicide attempt).  With written consent from the patient, the family member/significant other has been provided the following suicide prevention education, prior to the and/or following the discharge of the patient.  The suicide prevention education provided includes the following:  Suicide risk factors  Suicide prevention and interventions  National Suicide Hotline telephone number  [facility] assessment telephone number  [facility] Emergency Assistance 911  [facility] and/or Residential Mobile Crisis Unit telephone number  Request made of family/significant other to:  Remove weapons (e.g., guns, rifles, knives), all items previously/currently identified as safety concern.  Casimiro Needle to secure gun.  Remove drugs/medications (over-the-counter, prescriptions, illicit drugs), all items previously/currently identified as a safety concern.  The family member/significant other verbalizes understanding of the suicide prevention education information provided.  The family member/significant other agrees to remove the items of safety concern listed above.  Wynn Banker 11/17/2012, 11:36 AM

## 2012-11-17 NOTE — Progress Notes (Signed)
[facility] Adult Inpatient Family/Significant Other Suicide Prevention Education  Suicide Prevention Education:  Education Completed;  Lennice Sites, Mother -[phone] has been identified by the patient as the family member/significant other with whom the patient will be residing, and identified as the person(s) who will aid the patient in the event of a mental health crisis (suicidal ideations/suicide attempt).  With written consent from the patient, the family member/significant other has been provided the following suicide prevention education, prior to the and/or following the discharge of the patient.  The suicide prevention education provided includes the following:  Suicide risk factors  Suicide prevention and interventions  National Suicide Hotline telephone number  [facility] assessment telephone number  [facility] Emergency Assistance 911  [facility] and/or Residential Mobile Crisis Unit telephone number  Request made of family/significant other to:  Remove weapons (e.g., guns, rifles, knives), all items previously/currently identified as safety concern.  Mother reports patient does not have access to guns.  Remove drugs/medications (over-the-counter, prescriptions, illicit drugs), all items previously/currently identified as a safety concern.  The family member/significant other verbalizes understanding of the suicide prevention education information provided.  The family member/significant other agrees to remove the items of safety concern listed above.  Wynn Banker 11/17/2012, 1:13 PM

## 2012-11-17 NOTE — [facility] Suicide Risk Assessment (Signed)
Suicide Risk Assessment  Discharge Assessment     Demographic Factors:  Caucasian  Mental Status Per Nursing Assessment::   On Admission:  Suicidal ideation indicated by patient  Current Mental Status by Physician: In full contact with reality. There are no suicidal, homicidal ideas, plans or intent. Her mood is improved, her affect is bright, broad. She endorses that she needs to stay on her medications and that she is working to work on decreasing her stress level. She endorses that her husband is supportive   Loss Factors: NA  Historical Factors: NA  Risk Reduction Factors:   Positive social support  Continued Clinical Symptoms:  Bipolar Disorder:   Depressive phase  Cognitive Features That Contribute To Risk: None Identified   Suicide Risk:  Minimal: No identifiable suicidal ideation.  Patients presenting with no risk factors but with morbid ruminations; may be classified as minimal risk based on the severity of the depressive symptoms  Discharge Diagnoses:   AXIS I:  Bipolar depressed AXIS II:  Deferred AXIS III:   Past Medical History  Diagnosis Date  . HTN (hypertension)   . UTI (lower urinary tract infection)   . Mania   . Bipolar disorder   . Anxiety    AXIS IV:  None identified AXIS V:  61-70 mild symptoms  Plan Of Care/Follow-up recommendations:  Activity:  As tolerated  Is patient on multiple antipsychotic therapies at discharge:  No   Has Patient had three or more failed trials of antipsychotic monotherapy by history:  No  Recommended Plan for Multiple Antipsychotic Therapies: N/A   Embry Huss A 11/17/2012, 2:17 PM

## 2012-11-17 NOTE — Progress Notes (Signed)
Psychoeducational Group Note  Date:  11/17/2012 Time:  1100  Group Topic/Focus:  Recovery Goals:   The focus of this group is to identify appropriate goals for recovery and establish a plan to achieve them.  Participation Level:  Minimal  Participation Quality:  Appropriate and Attentive  Affect:  Appropriate  Cognitive:  Appropriate  Insight:  Good  Engagement in Group:  Limited  Additional Comments:  Delpha attended group and did not speak unless she was asked to speak. Patient would nod her head when she agreeable with comments that were stated based on recovery goals.  Karleen Hampshire Brittini 11/17/2012, 3:07 PM

## 2012-11-17 NOTE — Progress Notes (Signed)
[facility] Case Management Discharge Plan:  Will you be returning to the same living situation after discharge: Yes,  Patient returning home with husband At discharge, do you have transportation home?:Yes,  Family to provide transportation Do you have the ability to pay for your medications:Yes,  Patient is able to afford medications  Interagency Information:     Release of information consent forms completed and in the chart;  Patient's signature needed at discharge.  Patient to Follow up at:  Follow-up Information    Follow up with Dr. Dan Humphreys - [facility] [place]. On 11/26/2012. (You are scheduled with Dr. Dan Humphreys on Thursday, 11/26/2012 at 1:15)    Contact information:   [address] [place], [place]  [address]  [phone]      Follow up with Florencia Reasons - [facility] -. On 12/01/2012. (You are scheduled with Florencia Reasons on Tuesday, December 01, 2012 at 1:00 PM)    Contact information:   [address] [place], [place]  [address]  [phone]         Patient denies SI/HI:   Yes,  Patient denies SI/HI and other thoughts of self harm    Safety Planning and Suicide Prevention discussed:  Yes,  Reviewed during after care groups  Barrier to discharge identified:No.  Summary and Recommendations: Patient encouraged to be compliant with medications and to follow up with all outpatient recommendations.    Malyna Budney Hairston 11/17/2012, 10:20 AM

## 2012-11-17 NOTE — Progress Notes (Signed)
Psychoeducational Group Note  Date:  11/17/2012 Time: 2030  Group Topic/Focus:  Wrap-Up Group:   The focus of this group is to help patients review their daily goal of treatment and discuss progress on daily workbooks.  Participation Level:  Active  Participation Quality:  Sharing  Affect:  Depressed  Cognitive:  Oriented  Insight:  Good  Engagement in Group:  Good  Additional Comments:  Patient shared that she was proud of herself for attending all the groups today.  Jodel Mayhall, Newton Pigg 11/17/2012, 12:32 AM

## 2012-11-17 NOTE — Discharge Summary (Signed)
Physician Discharge Summary Note  Patient:  [name] is an 51 y.o., female MRN:  [id number] DOB:  1961/11/11 Patient phone:  [phone] (home)  Patient address:   [address] [place] [address],   Date of Admission:  11/13/2012  Date of Discharge: 11/17/12  Reason for Admission: Increased depression and unsafe feelings.  Discharge Diagnoses: Principal Problem:  *Bipolar 1 disorder, depressed, moderate   Axis Diagnosis:   AXIS I:  Bipolar affective disorder, depressed, moderate AXIS II:  Deferred AXIS III:   Past Medical History  Diagnosis Date  . HTN (hypertension)   . UTI (lower urinary tract infection)   . Mania   . Bipolar disorder   . Anxiety    AXIS IV:  other psychosocial or environmental problems AXIS V:  65  Level of Care:  OP  Hospital Course:  This is a first admission for [name] who is a 51 year old [name] who was sent here as a direct admit for medication management. She was taken off of her klonopin by her psychiatrist and placed on Tegratol when she began vomiting. She was restarted on the klonopin and sent to [facility] [name] stated that she was becoming more depressed and felt unsafe fearing that she would "go to far." She has a history of Bipolar disorder and was diagnosed in 2001 at the [facility] [place].  While a patient in this hospital, [name] received medication management for her Bipolar affective disorder, type 1. She was prescribed and received Risperdal 2 mg Q bedtime for mood control, Risperdal 0.25 mg tid for anxiety symptoms, Wellbutrin SR 150 mg for depression, Lithium Carbonate 600 mg for mood stabilization, Klonopin 1 mg for anxiety and Trazodone 50 mg Q bedtime for sleep. She also was enrolled in group counseling sessions and activities to learn coping skills that will help her cope with her symptoms after discharge. She also received medication management and monitoring for her other medical issues and concerns. She tolerated her  treatment regimen without any significant adverse effects and or reactions presented.   Patient did respond positively to her treatment regimen. This is evidenced by her daily reports of improved mood, reduction of symptoms and presentation of good affect. She attended treatment team meeting this am and met with her treatment team members. Her reason for admission, symptoms, response to treatment and discharge plans discussed with patient. [name] endorsed that her symptoms has stabilized and hat she she is ready for discharge. It was agreed upon that patient will follow-up care at [facility] in [place] with Dr. Dan [name] on 12/05 /13 @ 1:15 PM. On 12/01/12 at 1:00 PM, she will follow-up with [name] for counseling sessions at this same Clinic. The address, date and times for this appointment provided for patient.  Upon discharge, [name] adamantly denies any suicidal, homicidal ideations, auditory, visual hallucinations and or delusional thinking. She was provided with 4 days worth supply samples of her [facility] discharge medications. She left [facility] with all personal belongings via personal arranged transport in no apparent distress. Lithium levels upon discharge 0.60.   Consults:  None  Significant Diagnostic Studies:  labs: CBC with diff, CMP, UDS, Toxicology tests, Lithium levels  Discharge Vitals:   Blood pressure 106/76, pulse 114, temperature 98.6 F (37 C), temperature source Oral, resp. rate 16, height 5' 3.5" (1.613 m), weight 65.318 kg (144 lb), SpO2 99.00%. Lab Results:   Results for orders placed during the hospital encounter of 11/13/12 (from the  past 72 hour(s))  TSH     Status: Normal   Collection Time   11/17/12  6:20 AM      Component Value Range Comment   TSH 1.268  0.350 - 4.500 uIU/mL   LITHIUM LEVEL     Status: Abnormal   Collection Time   11/17/12  6:20 AM      Component Value Range Comment   Lithium Lvl 0.60 (*) 0.80 - 1.40 mEq/L   BASIC METABOLIC PANEL      Status: Normal   Collection Time   11/17/12  6:20 AM      Component Value Range Comment   Sodium 137  135 - 145 mEq/L    Potassium 3.7  3.5 - 5.1 mEq/L    Chloride 102  96 - 112 mEq/L    CO2 26  19 - 32 mEq/L    Glucose, Bld 97  70 - 99 mg/dL    BUN 11  6 - 23 mg/dL    Creatinine, Ser 1.61  0.50 - 1.10 mg/dL    Calcium 9.8  8.4 - 09.6 mg/dL    GFR calc non Af Amer >90  >90 mL/min    GFR calc Af Amer >90  >90 mL/min     Physical Findings: AIMS: Facial and Oral Movements Muscles of Facial Expression: None, normal Lips and Perioral Area: None, normal Jaw: None, normal Tongue: None, normal,Extremity Movements Upper (arms, wrists, hands, fingers): None, normal Lower (legs, knees, ankles, toes): None, normal, Trunk Movements Neck, shoulders, hips: None, normal, Overall Severity Severity of abnormal movements (highest score from questions above): None, normal Incapacitation due to abnormal movements: None, normal Patient's awareness of abnormal movements (rate only patient's report): No Awareness, Dental Status Current problems with teeth and/or dentures?: No Does patient usually wear dentures?: No  CIWA:  CIWA-Ar Total: 7  COWS:  COWS Total Score: 7   Mental Status Exam: See Mental Status Examination and Suicide Risk Assessment completed by Attending Physician prior to discharge.  Discharge destination:  Home  Is patient on multiple antipsychotic therapies at discharge:  No   Has Patient had three or more failed trials of antipsychotic monotherapy by history:  No  Recommended Plan for Multiple Antipsychotic Therapies: NA     Medication List     As of 11/17/2012  2:45 PM    STOP taking these medications         acyclovir 800 MG tablet   Commonly known as: ZOVIRAX      lithium 300 MG tablet   Replaced by: lithium 600 MG capsule      TAKE these medications      Indication    buPROPion 150 MG 12 hr tablet   Commonly known as: WELLBUTRIN SR   Take 1 tablet (150  mg total) by mouth 2 (two) times daily at 8am and 2pm. For depression       clonazePAM 1 MG tablet   Commonly known as: KLONOPIN   Take 1.5 tablets (1.5 mg total) by mouth at bedtime as needed. For anxiety    Indication: sleep/anxiety      lisinopril 2.5 MG tablet   Commonly known as: PRINIVIL,ZESTRIL   Take 1 tablet (2.5 mg total) by mouth daily. For high blood pressure control       lithium 600 MG capsule   Take 1 capsule (600 mg total) by mouth 2 (two) times daily in the am and at bedtime.. For mood stabilization    Indication: Depression, Mood  Disorder      risperiDONE 2 MG tablet   Commonly known as: RISPERDAL   Take 1 tablet (2 mg total) by mouth at bedtime. For mood control          Follow-up Information    Follow up with Dr. Dan [name] - [facility] [place]. On 11/26/2012. (You are scheduled with Dr. Dan [name] on Thursday, 11/26/2012 at 1:15)    Contact information:   [address] [place], [place]  [address]  [phone]      Follow up with Florencia Reasons - [facility]. On 12/01/2012. (You are scheduled with Florencia Reasons on Tuesday, December 01, 2012 at 1:00 PM)    Contact information:   [address] [place], [place]  [address]  [phone]         Follow-up recommendations:  Activity:  as tolerated Other:  Keep all scheduled follow-up appointments as recommended.    Comments:  Take all your medications as prescribed by your mental healthcare provider. Report any adverse effects and or reactions from your medicines to your outpatient provider promptly. Patient is instructed and cautioned to not engage in alcohol and or illegal drug use while on prescription medicines. In the event of worsening symptoms, patient is instructed to call the crisis hotline, 911 and or go to the nearest ED for appropriate evaluation and treatment of symptoms. Follow-up with your primary care provider for your other medical issues, concerns and or health care  needs.    SignedArmandina Stammer I 11/17/2012, 2:45 PM

## 2012-11-23 NOTE — Progress Notes (Signed)
Patient Discharge Instructions:  Next Level Care Provider Has Access to the EMR, 11/23/12 Records provided to Dr. Dan Humphreys [facility] via [facility] Access  Jerelene Redden, 11/23/2012, 2:12 PM

## 2012-11-23 NOTE — Progress Notes (Signed)
Reviewed

## 2012-11-24 NOTE — Discharge Summary (Signed)
Reviewed

## 2012-11-26 ENCOUNTER — Encounter (HOSPITAL_COMMUNITY): Payer: Self-pay | Admitting: Psychiatry

## 2012-11-26 ENCOUNTER — Ambulatory Visit (INDEPENDENT_AMBULATORY_CARE_PROVIDER_SITE_OTHER): Payer: PRIVATE HEALTH INSURANCE | Admitting: Psychiatry

## 2012-11-26 VITALS — BP 96/70 | HR 68 | Wt 147.0 lb

## 2012-11-26 DIAGNOSIS — G4700 Insomnia, unspecified: Secondary | ICD-10-CM

## 2012-11-26 DIAGNOSIS — F319 Bipolar disorder, unspecified: Secondary | ICD-10-CM

## 2012-11-26 DIAGNOSIS — F419 Anxiety disorder, unspecified: Secondary | ICD-10-CM

## 2012-11-26 DIAGNOSIS — F3132 Bipolar disorder, current episode depressed, moderate: Secondary | ICD-10-CM

## 2012-11-26 MED ORDER — CLONAZEPAM 1 MG PO TABS: 1.5000 mg | ORAL_TABLET | Freq: Every evening | ORAL | Status: DC | PRN

## 2012-11-26 MED ORDER — LITHIUM CARBONATE 600 MG PO CAPS: 600.0000 mg | ORAL_CAPSULE | ORAL | Status: DC

## 2012-11-26 MED ORDER — RISPERIDONE 2 MG PO TABS: 2.0000 mg | ORAL_TABLET | Freq: Every day | ORAL | Status: DC

## 2012-11-26 MED ORDER — BUPROPION HCL ER (SR) 150 MG PO TB12: 150.0000 mg | ORAL_TABLET | ORAL | Status: DC

## 2012-11-26 MED ORDER — RISPERIDONE 0.25 MG PO TABS: 0.2500 mg | ORAL_TABLET | Freq: Three times a day (TID) | ORAL | Status: DC | PRN

## 2012-11-26 NOTE — Addendum Note (Signed)
Addended by: Mike Craze on: 11/26/2012 02:06 PM   Modules accepted: Orders

## 2012-11-26 NOTE — Progress Notes (Signed)
[facility] MD Progress Note  11/26/2012 1:45 PM [name]  MRN:  [id number]  S: "I'm doing pretty good.  I got off the Tegretol and back on my meds and I am doing much better.  I did better on the PRN Risperdal during the day. "  ROS:  HEENT: - rhinorhea, - dry cough Resp: Negative SOB, neg wheezing, neg tachypnea Psych: + anxiety. - depression, - hallucinations, - insomnia All other systems negative  Diagnosis:   Axis I: Bipolar 1 disorder, depressed, moderate Axis II: Deferred Axis III:  Past Medical History  Diagnosis Date  . HTN (hypertension)   . UTI (lower urinary tract infection)   . Mania   . Bipolar disorder   . Anxiety    Axis IV: other psychosocial or environmental problems Axis V: moderate syptoms  ADL's:  Intact Fair hygiene  Sleep: Good "not sleeping well"  Appetite:  Good "Appetite is good. I lost 56 lbs with Weight [employer], so I am being careful about my intake."  Suicidal Ideation:  Denies  Homicidal Ideation:  Denies  AEB (as evidenced by): Per patient's reports  Mental Status Examination/Evaluation: Objective:  Appearance: Casual and Well Groomed  Eye Contact::  Good  Speech:  Clear and Coherent  Volume:  Normal  Mood:  Euthymic, slight anxiety  Affect:  Blunted affect  Thought Process:  Linear, and Intact  Orientation:  Full  Thought Content:  Denies hallucinations. Negative for paranoia.  Suicidal Thoughts:  No  Homicidal Thoughts:  No  Memory:  Immediate;   Good Recent;   Good Remote;   Good  Judgement:  Fair  Insight:  Good  Psychomotor Activity:  Normal  Concentration:  Good  Recall:  Good  Akathisia:  No  Handed:  Right  AIMS (if indicated):     Assets:  Communication Skills Desire for Improvement  Sleep:       Vital Signs:BP 96/70  Pulse 68  Wt 147 lb (66.679 kg)  Current Medications: Current Outpatient Prescriptions  Medication Sig Dispense Refill  . buPROPion (WELLBUTRIN SR) 150 MG 12 hr tablet Take 1 tablet (150 mg  total) by mouth 2 (two) times daily at 8am and 2pm. For depression  30 tablet  0  . clonazePAM (KLONOPIN) 1 MG tablet Take 1.5 tablets (1.5 mg total) by mouth at bedtime as needed. For anxiety  30 tablet    . lithium carbonate 600 MG capsule Take 1 capsule (600 mg total) by mouth 2 (two) times daily in the am and at bedtime.. For mood stabilization  60 capsule  0  . risperiDONE (RISPERDAL) 2 MG tablet Take 1 tablet (2 mg total) by mouth at bedtime. For mood control  30 tablet  0  . lisinopril (PRINIVIL,ZESTRIL) 2.5 MG tablet Take 1 tablet (2.5 mg total) by mouth daily. For high blood pressure control        Lab Results:  No results found for this or any previous visit (from the past 48 hour(s)).  Physical Findings: AIMS:  , ,  ,  ,    CIWA:    COWS:     Treatment Plan Summary: Daily contact with patient to assess and evaluate symptoms and progress in treatment Medication management  Plan:  I took her vitals.  I reviewed CC, tobacco/med/surg Hx, meds effects/ side effects, problem list, therapies and responses as well as current situation/symptoms discussed options. See orders and pt instructions for more details.  Derrisha Foos  11/26/2012, 1:45 PM

## 2012-11-26 NOTE — Patient Instructions (Signed)
Cut back on sugar and carbohydrates, that means very limited fruits and starchy vegetables and very limited grains, breads  The goal is low GLYCEMIC INDEX.  Cut back on all wheat, rye, or barley  Eat avocados, eggs, lean meat like grass fed beef and chicken  It could be good for you to get regular exercise, regular sleep, and  consume good quality, fish oil, 1000 mg twice a day. These 3 things are the foundation of rehabilitating your brain. Staying off all abusable substances including nicotine, caffeine, and refined sugar and avoiding further head injuries are the other important elements in helping you keep your brain working the best it can for you. If memory is a problem then INSTEAD of the fish oil mentioned above, try using Brain Power Basics from [employer].  You can order online or by phone [phone]. It costs $99 for the first month, and $80 monthly thereafter, but that investment in your brain and the recovery of your brain proper functioning would seem worth it.

## 2012-12-01 ENCOUNTER — Ambulatory Visit (HOSPITAL_COMMUNITY): Payer: Self-pay | Admitting: Psychiatry

## 2012-12-04 ENCOUNTER — Ambulatory Visit (HOSPITAL_COMMUNITY): Payer: Self-pay | Admitting: Psychiatry

## 2013-01-27 ENCOUNTER — Ambulatory Visit (INDEPENDENT_AMBULATORY_CARE_PROVIDER_SITE_OTHER): Payer: PRIVATE HEALTH INSURANCE | Admitting: Psychiatry

## 2013-01-27 ENCOUNTER — Encounter (HOSPITAL_COMMUNITY): Payer: Self-pay | Admitting: Psychiatry

## 2013-01-27 VITALS — Wt 149.6 lb

## 2013-01-27 DIAGNOSIS — F0634 Mood disorder due to known physiological condition with mixed features: Secondary | ICD-10-CM

## 2013-01-27 DIAGNOSIS — F319 Bipolar disorder, unspecified: Secondary | ICD-10-CM

## 2013-01-27 DIAGNOSIS — G4700 Insomnia, unspecified: Secondary | ICD-10-CM

## 2013-01-27 DIAGNOSIS — G939 Disorder of brain, unspecified: Secondary | ICD-10-CM

## 2013-01-27 DIAGNOSIS — F3132 Bipolar disorder, current episode depressed, moderate: Secondary | ICD-10-CM

## 2013-01-27 MED ORDER — RISPERIDONE 2 MG PO TABS: 2.0000 mg | ORAL_TABLET | Freq: Every day | ORAL | Status: DC

## 2013-01-27 MED ORDER — LITHIUM CARBONATE 600 MG PO CAPS: 600.0000 mg | ORAL_CAPSULE | ORAL | Status: DC

## 2013-01-27 MED ORDER — CLONAZEPAM 1 MG PO TABS: 1.5000 mg | ORAL_TABLET | Freq: Every evening | ORAL | Status: DC | PRN

## 2013-01-27 MED ORDER — BUPROPION HCL ER (SR) 200 MG PO TB12: 200.0000 mg | ORAL_TABLET | ORAL | Status: DC

## 2013-01-27 NOTE — Progress Notes (Signed)
[facility] [id number] Progress Note [name] MRN: [id number] DOB: 1961-05-27 Age: 52 y.o.  Date: 01/27/2013 Start Time: 1:25 PM End Time: 1:40 PM  Chief Complaint: Chief Complaint  Patient presents with  . Depression  . Follow-up  . Medication Refill    S: "I'm doing pretty good, except I'm sluggish". Depression 1 or 2/10 and Anxiety 2/10, where 1 is the best and 10 is the worst.     ROS:  HEENT: - rhinorhea, - dry cough Resp: Negative SOB, neg wheezing, neg tachypnea Psych: + anxiety. - depression, - hallucinations, - insomnia All other systems negative  Diagnosis:   Axis I: Bipolar 1 disorder, depressed, moderate Axis II: Deferred Axis III:  Past Medical History  Diagnosis Date  . HTN (hypertension)   . UTI (lower urinary tract infection)   . Mania   . Bipolar disorder   . Anxiety    Axis IV: other psychosocial or environmental problems Axis V: moderate syptoms  ADL's:  Intact Fair hygiene  Sleep: Good "not sleeping well"  Appetite:  Good "Appetite is good. I lost 58 lbs with Weight Watchers, so I am being careful about my intake."  Suicidal Ideation:  Denies  Homicidal Ideation:  Denies  AEB (as evidenced by): Per patient's reports  Mental Status Examination/Evaluation: Objective:  Appearance: Casual and Well Groomed  Eye Contact::  Good  Speech:  Clear and Coherent  Volume:  Normal  Mood:  Euthymic, slight anxiety  Affect:  Blunted affect  Thought Process:  Linear, and Intact  Orientation:  Full  Thought Content:  Denies hallucinations. Negative for paranoia.  Suicidal Thoughts:  No  Homicidal Thoughts:  No  Memory:  Immediate;   Good Recent;   Good Remote;   Good  Judgement:  Fair  Insight:  Good  Psychomotor Activity:  Normal  Concentration:  Good  Recall:  Good  Akathisia:  No  Handed:  Right  AIMS (if indicated):     Assets:  Communication Skills Desire for Improvement  Sleep:       Vital Signs:Wt 149 lb 9.6 oz  (67.858 kg)  Current Medications: Current Outpatient Prescriptions  Medication Sig Dispense Refill  . buPROPion (WELLBUTRIN SR) 150 MG 12 hr tablet Take 1 tablet (150 mg total) by mouth 2 (two) times daily at 8am and 2pm. For depression  60 tablet  1  . clonazePAM (KLONOPIN) 1 MG tablet Take 1.5 tablets (1.5 mg total) by mouth at bedtime as needed (insomnia). For anxiety  45 tablet  1  . lisinopril (PRINIVIL,ZESTRIL) 2.5 MG tablet Take 1 tablet (2.5 mg total) by mouth daily. For high blood pressure control      . lithium 600 MG capsule Take 1 capsule (600 mg total) by mouth 2 (two) times daily in the am and at bedtime.. For mood stabilization  60 capsule  1  . risperiDONE (RISPERDAL) 0.25 MG tablet Take 1-2 tablets (0.25-0.5 mg total) by mouth 3 (three) times daily as needed (anxiety).  90 tablet  2  . risperiDONE (RISPERDAL) 2 MG tablet Take 1 tablet (2 mg total) by mouth at bedtime. For mood control  30 tablet  1    Lab Results:  Recent Results (from the past 8736 hour(s))  LITHIUM LEVEL   Collection Time   05/05/12  2:21 PM      Component Value Range   Lithium Lvl 0.77 (*) 0.80 - 1.40 mEq/L  HEPATIC FUNCTION PANEL   Collection Time   11/13/12  5:02  PM      Component Value Range   Total Protein 6.7  6.0 - 8.3 g/dL   Albumin 3.9  3.5 - 5.2 g/dL   AST 15  0 - 37 U/L   ALT 15  0 - 35 U/L   Alkaline Phosphatase 74  39 - 117 U/L   Total Bilirubin 0.6  0.3 - 1.2 mg/dL   Bilirubin, Direct 0.1  0.0 - 0.3 mg/dL   Indirect Bilirubin 0.5  0.3 - 0.9 mg/dL  URINALYSIS, ROUTINE W REFLEX MICROSCOPIC   Collection Time   11/13/12  6:51 PM      Component Value Range   Color, Urine YELLOW  YELLOW   APPearance CLOUDY (*) CLEAR   Specific Gravity, Urine 1.017  1.005 - 1.030   pH 6.5  5.0 - 8.0   Glucose, UA NEGATIVE  NEGATIVE mg/dL   Hgb urine dipstick NEGATIVE  NEGATIVE   Bilirubin Urine NEGATIVE  NEGATIVE   Ketones, ur NEGATIVE  NEGATIVE mg/dL   Protein, ur NEGATIVE  NEGATIVE mg/dL    Urobilinogen, UA 0.2  0.0 - 1.0 mg/dL   Nitrite NEGATIVE  NEGATIVE   Leukocytes, UA NEGATIVE  NEGATIVE  PREGNANCY, URINE   Collection Time   11/13/12  6:51 PM      Component Value Range   Preg Test, Ur NEGATIVE  NEGATIVE  URINE RAPID DRUG SCREEN (HOSP PERFORMED)   Collection Time   11/13/12  6:51 PM      Component Value Range   Opiates NONE DETECTED  NONE DETECTED   Cocaine NONE DETECTED  NONE DETECTED   Benzodiazepines NONE DETECTED  NONE DETECTED   Amphetamines NONE DETECTED  NONE DETECTED   Tetrahydrocannabinol NONE DETECTED  NONE DETECTED   Barbiturates NONE DETECTED  NONE DETECTED  CBC   Collection Time   11/13/12  7:45 PM      Component Value Range   WBC 8.6  4.0 - 10.5 K/uL   RBC 4.53  3.87 - 5.11 MIL/uL   Hemoglobin 14.0  12.0 - 15.0 g/dL   HCT 95.6  21.3 - 08.6 %   MCV 93.8  78.0 - 100.0 fL   MCH 30.9  26.0 - 34.0 pg   MCHC 32.9  30.0 - 36.0 g/dL   RDW 57.8  46.9 - 62.9 %   Platelets 258  150 - 400 K/uL  COMPREHENSIVE METABOLIC PANEL   Collection Time   11/13/12  7:45 PM      Component Value Range   Sodium 139  135 - 145 mEq/L   Potassium 3.8  3.5 - 5.1 mEq/L   Chloride 102  96 - 112 mEq/L   CO2 25  19 - 32 mEq/L   Glucose, Bld 94  70 - 99 mg/dL   BUN 7  6 - 23 mg/dL   Creatinine, Ser 5.28  0.50 - 1.10 mg/dL   Calcium 41.3  8.4 - 24.4 mg/dL   Total Protein 6.8  6.0 - 8.3 g/dL   Albumin 3.9  3.5 - 5.2 g/dL   AST 18  0 - 37 U/L   ALT 16  0 - 35 U/L   Alkaline Phosphatase 73  39 - 117 U/L   Total Bilirubin 0.6  0.3 - 1.2 mg/dL   GFR calc non Af Amer 69 (*) >90 mL/min   GFR calc Af Amer 80 (*) >90 mL/min  HEMOGLOBIN A1C   Collection Time   11/13/12  7:45 PM      Component  Value Range   Hemoglobin A1C 5.0  <5.7 %   Mean Plasma Glucose 97  <117 mg/dL  TSH   Collection Time   11/13/12  7:45 PM      Component Value Range   TSH 1.786  0.350 - 4.500 uIU/mL  ETHANOL   Collection Time   11/13/12  7:45 PM      Component Value Range   Alcohol, Ethyl (B)  <11  0 - 11 mg/dL  LITHIUM LEVEL   Collection Time   11/13/12  7:45 PM      Component Value Range   Lithium Lvl 0.75 (*) 0.80 - 1.40 mEq/L  TSH   Collection Time   11/17/12  6:20 AM      Component Value Range   TSH 1.268  0.350 - 4.500 uIU/mL  LITHIUM LEVEL   Collection Time   11/17/12  6:20 AM      Component Value Range   Lithium Lvl 0.60 (*) 0.80 - 1.40 mEq/L  BASIC METABOLIC PANEL   Collection Time   11/17/12  6:20 AM      Component Value Range   Sodium 137  135 - 145 mEq/L   Potassium 3.7  3.5 - 5.1 mEq/L   Chloride 102  96 - 112 mEq/L   CO2 26  19 - 32 mEq/L   Glucose, Bld 97  70 - 99 mg/dL   BUN 11  6 - 23 mg/dL   Creatinine, Ser 1.61  0.50 - 1.10 mg/dL   Calcium 9.8  8.4 - 09.6 mg/dL   GFR calc non Af Amer >90  >90 mL/min   GFR calc Af Amer >90  >90 mL/min    Physical Findings: AIMS:  , ,  ,  ,    CIWA:    COWS:     Treatment Plan Summary: Medication management  Plan: I took her vitals.  I reviewed CC, tobacco/med/surg Hx, meds effects/ side effects, problem list, therapies and responses as well as current situation/symptoms discussed options. See orders and pt instructions for more details.  Medical Decision Making Problem Points:  Established problem, worsening (2), Review of last therapy session (1) and Review of psycho-social stressors (1) Data Points:  Review or order clinical lab tests (1) Review of new medications or change in dosage (2)  I certify that outpatient services furnished can reasonably be expected to improve the patient's condition.   Orson Aloe, MD, [facility]

## 2013-01-27 NOTE — Patient Instructions (Signed)
CUT BACK/CUT OUT on sugar and carbohydrates, that means very limited fruits and starchy vegetables and very limited grains, breads  The goal is low GLYCEMIC INDEX.  CUT OUT all wheat, rye, or barley for the GLUTEN in them.  HIGH fat and LOW carbohydrate diet is the KEY.  Eat avocados, eggs, lean meat like grass fed beef and chicken  Nuts and seeds would be good foods as well.   Stevia is an excellent sweetener.  Safe for the brain.   Almond butter is awesome.  Check out all this on the Internet.  Call if problems or concerns.

## 2013-02-06 ENCOUNTER — Other Ambulatory Visit: Payer: Self-pay

## 2013-03-10 ENCOUNTER — Ambulatory Visit (INDEPENDENT_AMBULATORY_CARE_PROVIDER_SITE_OTHER): Payer: PRIVATE HEALTH INSURANCE | Admitting: Psychiatry

## 2013-03-10 ENCOUNTER — Encounter (HOSPITAL_COMMUNITY): Payer: Self-pay | Admitting: Psychiatry

## 2013-03-10 VITALS — Wt 148.4 lb

## 2013-03-10 DIAGNOSIS — G4700 Insomnia, unspecified: Secondary | ICD-10-CM

## 2013-03-10 DIAGNOSIS — Z79899 Other long term (current) drug therapy: Secondary | ICD-10-CM

## 2013-03-10 DIAGNOSIS — F319 Bipolar disorder, unspecified: Secondary | ICD-10-CM

## 2013-03-10 DIAGNOSIS — F3132 Bipolar disorder, current episode depressed, moderate: Secondary | ICD-10-CM

## 2013-03-10 MED ORDER — RISPERIDONE 2 MG PO TABS: 2.0000 mg | ORAL_TABLET | Freq: Every day | ORAL | Status: DC

## 2013-03-10 MED ORDER — BUPROPION HCL ER (SR) 200 MG PO TB12: 200.0000 mg | ORAL_TABLET | ORAL | Status: DC

## 2013-03-10 MED ORDER — CLONAZEPAM 1 MG PO TABS: 1.5000 mg | ORAL_TABLET | Freq: Every evening | ORAL | Status: DC | PRN

## 2013-03-10 NOTE — Patient Instructions (Addendum)
Get labs after 5 days of no missed dose.   TWELVE to 14 hours after the last dose, DO NOT DRAW if longer than that.   ** **Please note on report HOURS since last dose.** **  We will discuss the dose at that time.  Call if problems or concerns.

## 2013-03-10 NOTE — Progress Notes (Signed)
[facility] [address] Progress Note [name] MRN: [id number] DOB: February 10, 1961 Age: 52 y.o.  Date: 03/10/2013 Start Time: 2:00 PM End Time:  2:30 PM  Chief Complaint: Chief Complaint  Patient presents with  . Depression  . Follow-up  . Medication Refill    S: "I'm doing okay, except some moodiness and racing thoughts when going to sleep". Depression 4/10, mania 4/10, and Anxiety 5/10, where 1 is the best and 10 is the worst.    Subjective: Pt returns for follow up appointment.  Pt reports that she is compliant with the psychotropic medications with good benefit and no noticeable side effects.  She is noting some mania.  Will get lithium level and see where she is with that.    ROS:  HEENT: - rhinorhea, - dry cough Resp: Negative SOB, neg wheezing, neg tachypnea Psych: + anxiety. - depression, - hallucinations, - insomnia All other systems negative  Diagnosis:   Axis I: Bipolar 1 disorder, depressed, moderate Axis II: Deferred Axis III:  Past Medical History  Diagnosis Date  . HTN (hypertension)   . UTI (lower urinary tract infection)   . Mania   . Bipolar disorder   . Anxiety    Axis IV: other psychosocial or environmental problems Axis V: moderate syptoms  ADL's:  Intact Fair hygiene  Sleep: Good "not sleeping well"  Appetite:  Good "Appetite is good. I lost 58 lbs with Weight Watchers, so I am being careful about my intake."  Suicidal Ideation:  Denies  Homicidal Ideation:  Denies  AEB (as evidenced by): Per patient's reports  Mental Status Examination/Evaluation: Objective:  Appearance: Casual and Well Groomed  Eye Contact::  Good  Speech:  Clear and Coherent  Volume:  Normal  Mood:  Euthymic, slight anxiety  Affect:  Blunted affect  Thought Process:  Linear, and Intact  Orientation:  Full  Thought Content:  Denies hallucinations. Negative for paranoia.  Suicidal Thoughts:  No  Homicidal Thoughts:  No  Memory:  Immediate;    Good Recent;   Good Remote;   Good  Judgement:  Fair  Insight:  Good  Psychomotor Activity:  Normal  Concentration:  Good  Recall:  Good  Akathisia:  No  Handed:  Right  AIMS (if indicated):     Assets:  Communication Skills Desire for Improvement  Sleep:       Vital Signs:Wt 148 lb 6.4 oz (67.314 kg)  BMI 25.87 kg/m2  Current Medications: Risperdal 0.25 mg twice a day and 2 mg at bedtime Lithium 600 mg twice a day Wellbutrin 150 mg twice a day Klonopin 1 mg 1.5 to 2 at bedtime  Lab Results:  Results for orders placed during the hospital encounter of 11/13/12 (from the past 8736 hour(s))  HEPATIC FUNCTION PANEL   Collection Time    11/13/12  5:02 PM      Result Value Range   Total Protein 6.7  6.0 - 8.3 g/dL   Albumin 3.9  3.5 - 5.2 g/dL   AST 15  0 - 37 U/L   ALT 15  0 - 35 U/L   Alkaline Phosphatase 74  39 - 117 U/L   Total Bilirubin 0.6  0.3 - 1.2 mg/dL   Bilirubin, Direct 0.1  0.0 - 0.3 mg/dL   Indirect Bilirubin 0.5  0.3 - 0.9 mg/dL  URINALYSIS, ROUTINE W REFLEX MICROSCOPIC   Collection Time    11/13/12  6:51 PM      Result Value Range  Color, Urine YELLOW  YELLOW   APPearance CLOUDY (*) CLEAR   Specific Gravity, Urine 1.017  1.005 - 1.030   pH 6.5  5.0 - 8.0   Glucose, UA NEGATIVE  NEGATIVE mg/dL   Hgb urine dipstick NEGATIVE  NEGATIVE   Bilirubin Urine NEGATIVE  NEGATIVE   Ketones, ur NEGATIVE  NEGATIVE mg/dL   Protein, ur NEGATIVE  NEGATIVE mg/dL   Urobilinogen, UA 0.2  0.0 - 1.0 mg/dL   Nitrite NEGATIVE  NEGATIVE   Leukocytes, UA NEGATIVE  NEGATIVE  PREGNANCY, URINE   Collection Time    11/13/12  6:51 PM      Result Value Range   Preg Test, Ur NEGATIVE  NEGATIVE  URINE RAPID DRUG SCREEN (HOSP PERFORMED)   Collection Time    11/13/12  6:51 PM      Result Value Range   Opiates NONE DETECTED  NONE DETECTED   Cocaine NONE DETECTED  NONE DETECTED   Benzodiazepines NONE DETECTED  NONE DETECTED   Amphetamines NONE DETECTED  NONE DETECTED    Tetrahydrocannabinol NONE DETECTED  NONE DETECTED   Barbiturates NONE DETECTED  NONE DETECTED  CBC   Collection Time    11/13/12  7:45 PM      Result Value Range   WBC 8.6  4.0 - 10.5 K/uL   RBC 4.53  3.87 - 5.11 MIL/uL   Hemoglobin 14.0  12.0 - 15.0 g/dL   HCT 78.2  95.6 - 21.3 %   MCV 93.8  78.0 - 100.0 fL   MCH 30.9  26.0 - 34.0 pg   MCHC 32.9  30.0 - 36.0 g/dL   RDW 08.6  57.8 - 46.9 %   Platelets 258  150 - 400 K/uL  COMPREHENSIVE METABOLIC PANEL   Collection Time    11/13/12  7:45 PM      Result Value Range   Sodium 139  135 - 145 mEq/L   Potassium 3.8  3.5 - 5.1 mEq/L   Chloride 102  96 - 112 mEq/L   CO2 25  19 - 32 mEq/L   Glucose, Bld 94  70 - 99 mg/dL   BUN 7  6 - 23 mg/dL   Creatinine, Ser 6.29  0.50 - 1.10 mg/dL   Calcium 52.8  8.4 - 41.3 mg/dL   Total Protein 6.8  6.0 - 8.3 g/dL   Albumin 3.9  3.5 - 5.2 g/dL   AST 18  0 - 37 U/L   ALT 16  0 - 35 U/L   Alkaline Phosphatase 73  39 - 117 U/L   Total Bilirubin 0.6  0.3 - 1.2 mg/dL   GFR calc non Af Amer 69 (*) >90 mL/min   GFR calc Af Amer 80 (*) >90 mL/min  HEMOGLOBIN A1C   Collection Time    11/13/12  7:45 PM      Result Value Range   Hemoglobin A1C 5.0  <5.7 %   Mean Plasma Glucose 97  <117 mg/dL  TSH   Collection Time    11/13/12  7:45 PM      Result Value Range   TSH 1.786  0.350 - 4.500 uIU/mL  ETHANOL   Collection Time    11/13/12  7:45 PM      Result Value Range   Alcohol, Ethyl (B) <11  0 - 11 mg/dL  LITHIUM LEVEL   Collection Time    11/13/12  7:45 PM      Result Value Range   Lithium Lvl  0.75 (*) 0.80 - 1.40 mEq/L  TSH   Collection Time    11/17/12  6:20 AM      Result Value Range   TSH 1.268  0.350 - 4.500 uIU/mL  LITHIUM LEVEL   Collection Time    11/17/12  6:20 AM      Result Value Range   Lithium Lvl 0.60 (*) 0.80 - 1.40 mEq/L  BASIC METABOLIC PANEL   Collection Time    11/17/12  6:20 AM      Result Value Range   Sodium 137  135 - 145 mEq/L   Potassium 3.7  3.5 - 5.1 mEq/L    Chloride 102  96 - 112 mEq/L   CO2 26  19 - 32 mEq/L   Glucose, Bld 97  70 - 99 mg/dL   BUN 11  6 - 23 mg/dL   Creatinine, Ser 1.61  0.50 - 1.10 mg/dL   Calcium 9.8  8.4 - 09.6 mg/dL   GFR calc non Af Amer >90  >90 mL/min   GFR calc Af Amer >90  >90 mL/min  Results for orders placed in visit on 05/05/12 (from the past 8736 hour(s))  LITHIUM LEVEL   Collection Time    05/05/12  2:21 PM      Result Value Range   Lithium Lvl 0.77 (*) 0.80 - 1.40 mEq/L    Physical Findings: AIMS:  , ,  ,  ,    CIWA:    COWS:     Treatment Plan Summary: Medication management  Plan: I took her vitals.  I reviewed CC, tobacco/med/surg Hx, meds effects/ side effects, problem list, therapies and responses as well as current situation/symptoms discussed options. Continue current effective medications, but get level of lithium to see if we can go up seems slightly manic during this time of spring. See orders and pt instructions for more details.  Medical Decision Making Problem Points:  Established problem, worsening (2), Review of last therapy session (1) and Review of psycho-social stressors (1) Data Points:  Review or order clinical lab tests (1) Review of medication regiment & side effects (2) Review of new medications or change in dosage (2)  I certify that outpatient services furnished can reasonably be expected to improve the patient's condition.   Orson Aloe, MD, [facility]

## 2013-03-22 ENCOUNTER — Telehealth (HOSPITAL_COMMUNITY): Payer: Self-pay | Admitting: Psychiatry

## 2013-03-22 DIAGNOSIS — F319 Bipolar disorder, unspecified: Secondary | ICD-10-CM

## 2013-03-23 ENCOUNTER — Telehealth (HOSPITAL_COMMUNITY): Payer: Self-pay | Admitting: Psychiatry

## 2013-03-23 MED ORDER — LITHIUM CARBONATE 600 MG PO CAPS: 600.0000 mg | ORAL_CAPSULE | Freq: Three times a day (TID) | ORAL | Status: DC

## 2013-03-23 NOTE — Telephone Encounter (Signed)
Labs drawn and sent to [facility].  She plans to go tomorrow to where she had the lab drawn and get the results.  Will go ahead and order Lithium at 3 a day #90 1 refill just to safe and so she won't run out of meds.

## 2013-03-26 ENCOUNTER — Telehealth (HOSPITAL_COMMUNITY): Payer: Self-pay | Admitting: Psychiatry

## 2013-03-26 NOTE — Telephone Encounter (Signed)
S/W pt who reports good clinical response to Lithium 1 in AM and 2 in PM.  Bragged to her about her HemoA1c being low and showing good sugar control.  Exchanged pleasantries and look forward to seeing each other on May 19th.

## 2013-05-10 ENCOUNTER — Ambulatory Visit (HOSPITAL_COMMUNITY): Payer: Self-pay | Admitting: Psychiatry

## 2013-05-13 ENCOUNTER — Ambulatory Visit (INDEPENDENT_AMBULATORY_CARE_PROVIDER_SITE_OTHER): Payer: PRIVATE HEALTH INSURANCE | Admitting: Psychiatry

## 2013-05-13 ENCOUNTER — Encounter (HOSPITAL_COMMUNITY): Payer: Self-pay | Admitting: Psychiatry

## 2013-05-13 VITALS — BP 124/80 | HR 78 | Ht 63.5 in | Wt 145.4 lb

## 2013-05-13 DIAGNOSIS — Z79899 Other long term (current) drug therapy: Secondary | ICD-10-CM

## 2013-05-13 DIAGNOSIS — G939 Disorder of brain, unspecified: Secondary | ICD-10-CM

## 2013-05-13 DIAGNOSIS — G4700 Insomnia, unspecified: Secondary | ICD-10-CM

## 2013-05-13 DIAGNOSIS — F3132 Bipolar disorder, current episode depressed, moderate: Secondary | ICD-10-CM

## 2013-05-13 DIAGNOSIS — F319 Bipolar disorder, unspecified: Secondary | ICD-10-CM

## 2013-05-13 DIAGNOSIS — F0634 Mood disorder due to known physiological condition with mixed features: Secondary | ICD-10-CM

## 2013-05-13 LAB — LIPID PANEL
Cholesterol: 174 mg/dL (ref 0–200)
HDL: 69 mg/dL (ref >39)
LDL Cholesterol: 95 mg/dL (ref 0–99)
Total CHOL/HDL Ratio: 2.5 Ratio
Triglycerides: 48 mg/dL (ref <150)
VLDL: 10 mg/dL (ref 0–40)

## 2013-05-13 MED ORDER — CLONAZEPAM 1 MG PO TABS: 1.5000 mg | ORAL_TABLET | Freq: Every evening | ORAL | Status: DC | PRN

## 2013-05-13 MED ORDER — RISPERIDONE 2 MG PO TABS: 2.0000 mg | ORAL_TABLET | Freq: Every day | ORAL | Status: DC

## 2013-05-13 MED ORDER — BUPROPION HCL ER (SR) 200 MG PO TB12: 200.0000 mg | ORAL_TABLET | ORAL | Status: DC

## 2013-05-13 MED ORDER — LITHIUM CARBONATE 600 MG PO CAPS: 600.0000 mg | ORAL_CAPSULE | Freq: Two times a day (BID) | ORAL | Status: DC

## 2013-05-13 NOTE — Patient Instructions (Addendum)
Yoga is a very helpful exercise method.  On TV, on line, or by DVD Adelfa Koh is a source of high quality information about yoga and videos on yoga.  Renee Ramus is the world's number one video yoga instructor according to some experts.  There are exceptional health benefits that can be achieved through yoga.  The main principles of yoga is acceptance, no competition, no comparison, and no judgement.  It is exceptional in helping people meditate and get to a very relaxed state.   Positions that may be helpful for relaxing your back include monkey, plank and side plank.  Lying or sitting spinal twist is also very helpful.    Relaxation is the ultimate solution for you.  You can seek it through tub baths, bubble baths, essential oils or incense, walking or chatting with friends, listening to soft music, watching a candle burn and just letting all thoughts go and appreciating the true essence of the Creator.  Pets or animals may be very helpful.  You might spend some time with them and then go do more directed meditation.  "I am Wishes Fulfilled Meditation" by Marylene Buerger and Lyndal Pulley may be helpful MUSIC for getting to sleep or for meditating You can order it from on line.  You might find the Chill channel on Pandora and explore the artists that you like better.   Cephus Slater has a album called [place] Dreams that we listened to today.  It is awesome.  Take care of yourself.  No one else is standing up to do the job and only you know what you need.   GET SERIOUS about taking care of yourself.  Do the next right thing and that often means doing something to care for yourself along the lines of are you hungry, are you angry, are you lonely, are you tired, are you scared?  [employer] is what that stands for.  Call if problems or concerns.  You will be seeing a different doctor at your next visit.  I have enjoyed working with you and feel privileged to have worked with you.

## 2013-05-13 NOTE — Progress Notes (Signed)
[facility] [address] Progress Note [name] MRN: [id number] DOB: 1961-11-02 Age: 52 y.o.  Date: 05/13/2013 Start Time: 9:15 AM End Time:  9:40 AM  Chief Complaint: Chief Complaint  Patient presents with  . Depression  . Follow-up  . Medication Refill    S: "I couldn't take the Lithium 3 times a day because of sluggishness and headaches, so ;i cut it back to 2 a day.  I see what you mean by March and September being a time with rapid shifts in mood.". Depression 4/10, mania 0/10, and Anxiety 5/10, where 1 is the best and 10 is the worst.  Pain is 0/10  Subjective: Pt returns for follow up appointment.  Pt reports that she is compliant with the psychotropic medications with good benefit and no noticeable side effects.  She noted some mania during March.  As above she cut back on the Lithium and notes pretty good control of mood.  She is now working with [employer] in [place].  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    Encourage her to really express her creative side though that organization.  Encourage her to use music and tub baths to help her heal.    Allergies: Allergies  Allergen Reactions  . Tegretol (Carbamazepine) Nausea And Vomiting  . Codeine Nausea Only  . Divalproex Sodium Swelling  . Erythromycin Nausea And Vomiting  . Phenytoin Sodium Extended Swelling   Medical History: Past Medical History  Diagnosis Date  . HTN (hypertension)   . UTI (lower urinary tract infection)   . Mania   . Bipolar disorder   . Anxiety    Surgical History: Past Surgical History  Procedure Laterality Date  . Brain surgery    . Abdominal hysterectomy    . Knee surgery      right x 2  . Incontinence surgery     Family History: family history includes Alcohol abuse in her father; Drug abuse in her brother; and Mental illness in her mother.  There is no history of Dementia, and ADD / ADHD, and Anxiety disorder, and Bipolar disorder, and  Depression, and OCD, and Paranoid behavior, and Schizophrenia, and Seizures, and Sexual abuse, and Physical abuse, and Suicidality, . Reviewed again tod during the visit.  ROS:  HEENT: - rhinorhea, - dry cough Resp: Negative SOB, neg wheezing, neg tachypnea Psych: + anxiety. - depression, - hallucinations, - insomnia All other systems negative  Diagnosis:   Axis I: Bipolar 1 disorder, depressed, moderate Axis II: Deferred Axis III:  Past Medical History  Diagnosis Date  . HTN (hypertension)   . UTI (lower urinary tract infection)   . Mania   . Bipolar disorder   . Anxiety    Axis IV: other psychosocial or environmental problems Axis V: moderate syptoms  ADL's:  Intact Fair hygiene  Sleep: Good "not sleeping well"  Appetite:  Good "Appetite is good. I lost 58 lbs with [employer], so I am being careful about my intake."  Suicidal Ideation:  Denies  Homicidal Ideation:  Denies  AEB (as evidenced by): Per patient's reports  Mental Status Examination/Evaluation: Objective:  Appearance: Casual and Well Groomed  Eye Contact::  Good  Speech:  Clear and Coherent  Volume:  Normal  Mood:  Euthymic, slight anxiety  Affect:  Blunted affect  Thought Process:  Linear, and Intact  Orientation:  Full  Thought Content:  Denies hallucinations. Negative for paranoia.  Suicidal Thoughts:  No  Homicidal Thoughts:  No  Memory:  Immediate;   Good Recent;   Good Remote;   Good  Judgement:  Fair  Insight:  Good  Psychomotor Activity:  Normal  Concentration:  Good  Recall:  Good  Akathisia:  No  Handed:  Right  AIMS (if indicated):     Assets:  Communication Skills Desire for Improvement  Sleep:       Vital Signs:BP 124/80  Pulse 78  Ht 5' 3.5" (1.613 m)  Wt 145 lb 6.4 oz (65.953 kg)  BMI 25.35 kg/m2  Current Medications: Risperdal 0.25 mg twice a day and 2 mg at bedtime Lithium 600 mg twice a day Wellbutrin 200 mg twice a day Klonopin 1 mg 1.5 to 2 at  bedtime  Lab Results:  Results for orders placed during the hospital encounter of 11/13/12 (from the past 8736 hour(s))  HEPATIC FUNCTION PANEL   Collection Time    11/13/12  5:02 PM      Result Value Range   Total Protein 6.7  6.0 - 8.3 g/dL   Albumin 3.9  3.5 - 5.2 g/dL   AST 15  0 - 37 U/L   ALT 15  0 - 35 U/L   Alkaline Phosphatase 74  39 - 117 U/L   Total Bilirubin 0.6  0.3 - 1.2 mg/dL   Bilirubin, Direct 0.1  0.0 - 0.3 mg/dL   Indirect Bilirubin 0.5  0.3 - 0.9 mg/dL  URINALYSIS, ROUTINE W REFLEX MICROSCOPIC   Collection Time    11/13/12  6:51 PM      Result Value Range   Color, Urine YELLOW  YELLOW   APPearance CLOUDY (*) CLEAR   Specific Gravity, Urine 1.017  1.005 - 1.030   pH 6.5  5.0 - 8.0   Glucose, UA NEGATIVE  NEGATIVE mg/dL   Hgb urine dipstick NEGATIVE  NEGATIVE   Bilirubin Urine NEGATIVE  NEGATIVE   Ketones, ur NEGATIVE  NEGATIVE mg/dL   Protein, ur NEGATIVE  NEGATIVE mg/dL   Urobilinogen, UA 0.2  0.0 - 1.0 mg/dL   Nitrite NEGATIVE  NEGATIVE   Leukocytes, UA NEGATIVE  NEGATIVE  PREGNANCY, URINE   Collection Time    11/13/12  6:51 PM      Result Value Range   Preg Test, Ur NEGATIVE  NEGATIVE  URINE RAPID DRUG SCREEN (HOSP PERFORMED)   Collection Time    11/13/12  6:51 PM      Result Value Range   Opiates NONE DETECTED  NONE DETECTED   Cocaine NONE DETECTED  NONE DETECTED   Benzodiazepines NONE DETECTED  NONE DETECTED   Amphetamines NONE DETECTED  NONE DETECTED   Tetrahydrocannabinol NONE DETECTED  NONE DETECTED   Barbiturates NONE DETECTED  NONE DETECTED  CBC   Collection Time    11/13/12  7:45 PM      Result Value Range   WBC 8.6  4.0 - 10.5 K/uL   RBC 4.53  3.87 - 5.11 MIL/uL   Hemoglobin 14.0  12.0 - 15.0 g/dL   HCT 16.1  09.6 - 04.5 %   MCV 93.8  78.0 - 100.0 fL   MCH 30.9  26.0 - 34.0 pg   MCHC 32.9  30.0 - 36.0 g/dL   RDW 40.9  81.1 - 91.4 %   Platelets 258  150 - 400 K/uL  COMPREHENSIVE METABOLIC PANEL   Collection Time    11/13/12   7:45 PM      Result Value Range   Sodium  139  135 - 145 mEq/L   Potassium 3.8  3.5 - 5.1 mEq/L   Chloride 102  96 - 112 mEq/L   CO2 25  19 - 32 mEq/L   Glucose, Bld 94  70 - 99 mg/dL   BUN 7  6 - 23 mg/dL   Creatinine, Ser 1.61  0.50 - 1.10 mg/dL   Calcium 09.6  8.4 - 04.5 mg/dL   Total Protein 6.8  6.0 - 8.3 g/dL   Albumin 3.9  3.5 - 5.2 g/dL   AST 18  0 - 37 U/L   ALT 16  0 - 35 U/L   Alkaline Phosphatase 73  39 - 117 U/L   Total Bilirubin 0.6  0.3 - 1.2 mg/dL   GFR calc non Af Amer 69 (*) >90 mL/min   GFR calc Af Amer 80 (*) >90 mL/min  HEMOGLOBIN A1C   Collection Time    11/13/12  7:45 PM      Result Value Range   Hemoglobin A1C 5.0  <5.7 %   Mean Plasma Glucose 97  <117 mg/dL  TSH   Collection Time    11/13/12  7:45 PM      Result Value Range   TSH 1.786  0.350 - 4.500 uIU/mL  ETHANOL   Collection Time    11/13/12  7:45 PM      Result Value Range   Alcohol, Ethyl (B) <11  0 - 11 mg/dL  LITHIUM LEVEL   Collection Time    11/13/12  7:45 PM      Result Value Range   Lithium Lvl 0.75 (*) 0.80 - 1.40 mEq/L  TSH   Collection Time    11/17/12  6:20 AM      Result Value Range   TSH 1.268  0.350 - 4.500 uIU/mL  LITHIUM LEVEL   Collection Time    11/17/12  6:20 AM      Result Value Range   Lithium Lvl 0.60 (*) 0.80 - 1.40 mEq/L  BASIC METABOLIC PANEL   Collection Time    11/17/12  6:20 AM      Result Value Range   Sodium 137  135 - 145 mEq/L   Potassium 3.7  3.5 - 5.1 mEq/L   Chloride 102  96 - 112 mEq/L   CO2 26  19 - 32 mEq/L   Glucose, Bld 97  70 - 99 mg/dL   BUN 11  6 - 23 mg/dL   Creatinine, Ser 4.09  0.50 - 1.10 mg/dL   Calcium 9.8  8.4 - 81.1 mg/dL   GFR calc non Af Amer >90  >90 mL/min   GFR calc Af Amer >90  >90 mL/min    Physical Findings: AIMS:  , ,  ,  ,    CIWA:    COWS:     Treatment Plan Summary: Medication management  Plan: I took her vitals.  I reviewed CC, tobacco/med/surg Hx, meds effects/ side effects, problem list, therapies  and responses as well as current situation/symptoms discussed options. Continue current effective medications, but get level of lithium to see if we can go up seems slightly manic during this time of spring. See orders and pt instructions for more details.  MEDICATIONS this encounter: No orders of the defined types were placed in this encounter.   Medical Decision Making Problem Points:  Established problem, worsening (2), Review of last therapy session (1) and Review of psycho-social stressors (1) Data Points:  Review or order clinical lab  tests (1) Review of medication regiment & side effects (2) Review of new medications or change in dosage (2)  I certify that outpatient services furnished can reasonably be expected to improve the patient's condition.   Orson Aloe, MD, [facility]

## 2013-05-24 ENCOUNTER — Telehealth (HOSPITAL_COMMUNITY): Payer: Self-pay | Admitting: Psychiatry

## 2013-05-24 NOTE — Telephone Encounter (Signed)
Fax request filled out and returned to pharmacy.

## 2013-07-14 ENCOUNTER — Ambulatory Visit (HOSPITAL_COMMUNITY): Payer: Self-pay | Admitting: Psychiatry

## 2013-07-21 ENCOUNTER — Telehealth (HOSPITAL_COMMUNITY): Payer: Self-pay

## 2013-07-21 NOTE — Telephone Encounter (Signed)
10:45am 07/21/13 Spoke with patient informed that Dr. Lolly Mustache will be unable to take her on as a patient due to his schedule - pt already has an appt 08/06 in the [place] office - informed the pt that the new doctor - Dr. Tenny Craw will start on Aug. 11th/sh

## 2013-07-21 NOTE — Telephone Encounter (Signed)
10:45am 07/21/13 Spoke with patient informed that Dr. Arfeen will be unable to take her on as a patient due to his schedule - pt already has an appt 08/06 in the [place] office - informed the pt that the new doctor - Dr. Ross will start on Aug. 11th/sh 

## 2013-07-28 ENCOUNTER — Ambulatory Visit (HOSPITAL_COMMUNITY): Payer: Self-pay | Admitting: Psychiatry

## 2013-07-29 ENCOUNTER — Encounter (HOSPITAL_COMMUNITY): Payer: Self-pay | Admitting: Psychiatry

## 2013-07-29 ENCOUNTER — Ambulatory Visit (HOSPITAL_COMMUNITY): Payer: Self-pay | Admitting: Psychiatry

## 2013-07-29 ENCOUNTER — Ambulatory Visit (INDEPENDENT_AMBULATORY_CARE_PROVIDER_SITE_OTHER): Payer: PRIVATE HEALTH INSURANCE | Admitting: Psychiatry

## 2013-07-29 VITALS — BP 104/87 | HR 88 | Wt 148.0 lb

## 2013-07-29 DIAGNOSIS — F319 Bipolar disorder, unspecified: Secondary | ICD-10-CM

## 2013-07-29 DIAGNOSIS — G4700 Insomnia, unspecified: Secondary | ICD-10-CM

## 2013-07-29 DIAGNOSIS — F29 Unspecified psychosis not due to a substance or known physiological condition: Secondary | ICD-10-CM

## 2013-07-29 MED ORDER — CLONAZEPAM 1 MG PO TABS: 1.5000 mg | ORAL_TABLET | Freq: Every evening | ORAL | Status: DC | PRN

## 2013-07-29 MED ORDER — RISPERIDONE 2 MG PO TABS: 2.0000 mg | ORAL_TABLET | Freq: Every day | ORAL | Status: DC

## 2013-07-29 MED ORDER — LITHIUM CARBONATE 600 MG PO CAPS: 600.0000 mg | ORAL_CAPSULE | Freq: Two times a day (BID) | ORAL | Status: DC

## 2013-07-29 MED ORDER — BUPROPION HCL ER (XL) 300 MG PO TB24: 300.0000 mg | ORAL_TABLET | ORAL | Status: DC

## 2013-07-29 NOTE — Progress Notes (Signed)
[facility] [address] Progress Note [name] MRN: [id number] DOB: 10-22-1961 Age: 52 y.o.  Date: 07/29/2013  Chief Complaint  Patient presents with  . Follow-up  . Agitation  . Manic Behavior    History of presenting illness.  this is 51 year old she does endorse increased irritability and anger and manic-like symptoms.  She has noticed these symptoms the past few weeks.  She do not remember what triggered the symptoms.  She admitted having fascination to playing with the guns and sometimes thinking to flip her car .  Her husband has taken the gun.  Patient admitted irritability agitation and sometimes poor sleep.  She did call the symptoms started when her Wellbutrin was increased a few months ago.  She was prescribed Risperdal 0.25 milligram 3 times a day but she has not taken regularly.  She takes Risperdal 2 mg and lithium 600 mg twice a day.  I could not find any recent lithium level however patient told that her lithium level was done at [facility] and it was discussed Dr. Dan Humphreys.  However there were no new level is in the chart.  The patient also endorsed not comfortable with Dr. Dan Humphreys in the past.  At her on Tegretol and that resulted increase confusion suicidal thinking and patient was in the [facility].  She is no longer taking antihypertensive medication.  She is taking Klonopin as prescribed.  She admitted some time headaches.  She continues to be involved in charity program and goes to pot at club chapter in [place].  She denies any hallucination or any active or passive systolic thinking however she wants the medication adjustment to reduce her irritability anger and reduce fascination about guns.  Past psychiatric history. The patient has significant history of bipolar disorder but at least 5 psychiatric inpatient treatment.  Patient has no history of suicidal attempt but admitted history of passive suicidal thinking and severe manic episode.  In the past she  had tried Zyprexa Seroquel however she is very resistant and reluctant to take any medication that can cause weight gain.  She was tried tried Tegretol however she developed increased paranoia confusion and she was required hospitalization at [facility].    Allergies: Allergies  Allergen Reactions  . Tegretol (Carbamazepine) Nausea And Vomiting  . Codeine Nausea Only  . Divalproex Sodium Swelling  . Erythromycin Nausea And Vomiting  . Phenytoin Sodium Extended Swelling   Medical History: Past Medical History  Diagnosis Date  . HTN (hypertension)   . Mania   . Bipolar disorder   . Anxiety    she is to take antihypertensive medication but no longer says her perpetrator has been fairly stable.  Surgical History: Past Surgical History  Procedure Laterality Date  . Brain surgery    . Abdominal hysterectomy    . Knee surgery      right x 2  . Incontinence surgery     Family History: family history includes Alcohol abuse in her father; Drug abuse in her brother; and Mental illness in her mother.  There is no history of Dementia, and ADD / ADHD, and Anxiety disorder, and Bipolar disorder, and Depression, and OCD, and Paranoid behavior, and Schizophrenia, and Seizures, and Sexual abuse, and Physical abuse, and Suicidality, . Reviewed again tod during the visit.  Review of Systems  Constitutional: Negative.   Respiratory: Negative.   Cardiovascular: Negative.   Musculoskeletal: Negative.   Neurological: Positive for headaches. Negative for focal weakness and seizures.  Psychiatric/Behavioral:  Positive for hallucinations. Negative for depression, suicidal ideas and substance abuse. The patient is nervous/anxious and has insomnia.        Irritability.    Vital Signs:BP 104/87  Pulse 88  Wt 148 lb (67.132 kg)  BMI 25.8 kg/m2   Lab Results:  Results for orders placed in visit on 05/13/13 (from the past 8736 hour(s))  LIPID PANEL   Collection Time    05/13/13  9:38  AM      Result Value Range   Cholesterol 174  0 - 200 mg/dL   Triglycerides 48  <045 mg/dL   HDL 69  >40 mg/dL   Total CHOL/HDL Ratio 2.5     VLDL 10  0 - 40 mg/dL   LDL Cholesterol 95  0 - 99 mg/dL  Results for orders placed during the hospital encounter of 11/13/12 (from the past 8736 hour(s))  HEPATIC FUNCTION PANEL   Collection Time    11/13/12  5:02 PM      Result Value Range   Total Protein 6.7  6.0 - 8.3 g/dL   Albumin 3.9  3.5 - 5.2 g/dL   AST 15  0 - 37 U/L   ALT 15  0 - 35 U/L   Alkaline Phosphatase 74  39 - 117 U/L   Total Bilirubin 0.6  0.3 - 1.2 mg/dL   Bilirubin, Direct 0.1  0.0 - 0.3 mg/dL   Indirect Bilirubin 0.5  0.3 - 0.9 mg/dL  URINALYSIS, ROUTINE W REFLEX MICROSCOPIC   Collection Time    11/13/12  6:51 PM      Result Value Range   Color, Urine YELLOW  YELLOW   APPearance CLOUDY (*) CLEAR   Specific Gravity, Urine 1.017  1.005 - 1.030   pH 6.5  5.0 - 8.0   Glucose, UA NEGATIVE  NEGATIVE mg/dL   Hgb urine dipstick NEGATIVE  NEGATIVE   Bilirubin Urine NEGATIVE  NEGATIVE   Ketones, ur NEGATIVE  NEGATIVE mg/dL   Protein, ur NEGATIVE  NEGATIVE mg/dL   Urobilinogen, UA 0.2  0.0 - 1.0 mg/dL   Nitrite NEGATIVE  NEGATIVE   Leukocytes, UA NEGATIVE  NEGATIVE  PREGNANCY, URINE   Collection Time    11/13/12  6:51 PM      Result Value Range   Preg Test, Ur NEGATIVE  NEGATIVE  URINE RAPID DRUG SCREEN (HOSP PERFORMED)   Collection Time    11/13/12  6:51 PM      Result Value Range   Opiates NONE DETECTED  NONE DETECTED   Cocaine NONE DETECTED  NONE DETECTED   Benzodiazepines NONE DETECTED  NONE DETECTED   Amphetamines NONE DETECTED  NONE DETECTED   Tetrahydrocannabinol NONE DETECTED  NONE DETECTED   Barbiturates NONE DETECTED  NONE DETECTED  CBC   Collection Time    11/13/12  7:45 PM      Result Value Range   WBC 8.6  4.0 - 10.5 K/uL   RBC 4.53  3.87 - 5.11 MIL/uL   Hemoglobin 14.0  12.0 - 15.0 g/dL   HCT 98.1  19.1 - 47.8 %   MCV 93.8  78.0 - 100.0 fL    MCH 30.9  26.0 - 34.0 pg   MCHC 32.9  30.0 - 36.0 g/dL   RDW 29.5  62.1 - 30.8 %   Platelets 258  150 - 400 K/uL  COMPREHENSIVE METABOLIC PANEL   Collection Time    11/13/12  7:45 PM      Result Value Range  Sodium 139  135 - 145 mEq/L   Potassium 3.8  3.5 - 5.1 mEq/L   Chloride 102  96 - 112 mEq/L   CO2 25  19 - 32 mEq/L   Glucose, Bld 94  70 - 99 mg/dL   BUN 7  6 - 23 mg/dL   Creatinine, Ser 1.61  0.50 - 1.10 mg/dL   Calcium 09.6  8.4 - 04.5 mg/dL   Total Protein 6.8  6.0 - 8.3 g/dL   Albumin 3.9  3.5 - 5.2 g/dL   AST 18  0 - 37 U/L   ALT 16  0 - 35 U/L   Alkaline Phosphatase 73  39 - 117 U/L   Total Bilirubin 0.6  0.3 - 1.2 mg/dL   GFR calc non Af Amer 69 (*) >90 mL/min   GFR calc Af Amer 80 (*) >90 mL/min  HEMOGLOBIN A1C   Collection Time    11/13/12  7:45 PM      Result Value Range   Hemoglobin A1C 5.0  <5.7 %   Mean Plasma Glucose 97  <117 mg/dL  TSH   Collection Time    11/13/12  7:45 PM      Result Value Range   TSH 1.786  0.350 - 4.500 uIU/mL  ETHANOL   Collection Time    11/13/12  7:45 PM      Result Value Range   Alcohol, Ethyl (B) <11  0 - 11 mg/dL  LITHIUM LEVEL   Collection Time    11/13/12  7:45 PM      Result Value Range   Lithium Lvl 0.75 (*) 0.80 - 1.40 mEq/L  TSH   Collection Time    11/17/12  6:20 AM      Result Value Range   TSH 1.268  0.350 - 4.500 uIU/mL  LITHIUM LEVEL   Collection Time    11/17/12  6:20 AM      Result Value Range   Lithium Lvl 0.60 (*) 0.80 - 1.40 mEq/L  BASIC METABOLIC PANEL   Collection Time    11/17/12  6:20 AM      Result Value Range   Sodium 137  135 - 145 mEq/L   Potassium 3.7  3.5 - 5.1 mEq/L   Chloride 102  96 - 112 mEq/L   CO2 26  19 - 32 mEq/L   Glucose, Bld 97  70 - 99 mg/dL   BUN 11  6 - 23 mg/dL   Creatinine, Ser 4.09  0.50 - 1.10 mg/dL   Calcium 9.8  8.4 - 81.1 mg/dL   GFR calc non Af Amer >90  >90 mL/min   GFR calc Af Amer >90  >90 mL/min    Diagnoses 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  At this time patient slowly decompensating and started to have manic symptoms.  It is unclear what triggered however she noticed that since Wellbutrin was increased she has noticed more irritability.  I recommend to decrease Wellbutrin to 300 mg a day .  Currently she is taking 200 mg twice a day.  I also recommend if she can provide a copy of the results since she had in a different laboratory and we do not have any access.  Patient will bring a copy of the results on her next appointment.  She like  to followup with this writer increased her office.  I also recommend to take risperidone 2 mg at bedtime and encouraged to take 0.25 mg 3 times a day to help the mania.  We will continue Klonopin 1.5 mg at bedtime.  Will see her in 4 weeks.  Time spent 25 minutes.  More than 50% of the time spent in psychoeducation, counseling and coordination of care.  Discuss safety plan that anytime having active suicidal thoughts or homicidal thoughts then patient need to call 911 or go to the local emergency room.   MEDICATIONS this encounter: Meds ordered this encounter  Medications  . buPROPion (WELLBUTRIN XL) 300 MG 24 hr tablet    Sig: Take 1 tablet (300 mg total) by mouth every morning.    Dispense:  30 tablet    Refill:  0  . lithium 600 MG capsule    Sig: Take 1 capsule (600 mg total) by mouth 2 (two) times daily with a meal. For mood stabilization    Dispense:  60 capsule    Refill:  0    Order Specific Question:  Supervising Provider    Answer:  Geoffery Lyons A [[id number]]  . clonazePAM (KLONOPIN) 1 MG tablet    Sig: Take 1.5-2 tablets (1.5-2 mg total) by mouth at bedtime as needed (insomnia). For anxiety    Dispense:  45 tablet    Refill:  0    Order Specific Question:  Supervising Provider    Answer:  Geoffery Lyons A [[id number]]  . risperiDONE  (RISPERDAL) 2 MG tablet    Sig: Take 1 tablet (2 mg total) by mouth at bedtime. For mood control    Dispense:  30 tablet    Refill:  0    Order Specific Question:  Supervising Provider    Answer:  Rachael Fee [id number]   Medical Decision Making Problem Points:  Established problem, worsening (2), New problem, with additional work-up planned (4), Review of last therapy session (1) and Review of psycho-social stressors (1) Data Points:  Review or order clinical lab tests (1) Review of medication regiment & side effects (2) Review of new medications or change in dosage (2)  Nayquan Evinger T., MD

## 2013-08-18 ENCOUNTER — Encounter (HOSPITAL_COMMUNITY): Payer: Self-pay | Admitting: Psychiatry

## 2013-08-18 ENCOUNTER — Ambulatory Visit (INDEPENDENT_AMBULATORY_CARE_PROVIDER_SITE_OTHER): Payer: PRIVATE HEALTH INSURANCE | Admitting: Psychiatry

## 2013-08-18 VITALS — BP 112/78 | HR 76 | Ht 63.0 in | Wt 149.6 lb

## 2013-08-18 DIAGNOSIS — F319 Bipolar disorder, unspecified: Secondary | ICD-10-CM

## 2013-08-18 DIAGNOSIS — F29 Unspecified psychosis not due to a substance or known physiological condition: Secondary | ICD-10-CM

## 2013-08-18 MED ORDER — LITHIUM CARBONATE 300 MG PO TABS: 300.0000 mg | ORAL_TABLET | Freq: Every day | ORAL | Status: DC

## 2013-08-18 MED ORDER — RISPERIDONE 0.25 MG PO TABS: ORAL_TABLET | ORAL | Status: DC

## 2013-08-18 MED ORDER — BUPROPION HCL ER (XL) 150 MG PO TB24: 150.0000 mg | ORAL_TABLET | ORAL | Status: DC

## 2013-08-18 MED ORDER — RISPERIDONE 2 MG PO TABS: 2.0000 mg | ORAL_TABLET | Freq: Every day | ORAL | Status: DC

## 2013-08-18 MED ORDER — CLONAZEPAM 1 MG PO TABS: ORAL_TABLET | ORAL | Status: DC

## 2013-08-18 MED ORDER — LITHIUM CARBONATE 600 MG PO CAPS: 600.0000 mg | ORAL_CAPSULE | Freq: Two times a day (BID) | ORAL | Status: DC

## 2013-08-18 NOTE — Progress Notes (Signed)
Patient ID: [name], female   DOB: April 04, 1961, 52 y.o.   MRN: [id number] [facility] [id number] Progress Note [name] MRN: [id number] DOB: [birth year] Age: 52 y.o.  Date: 08/18/2013  Chief Complaint  Patient presents with  . Follow-up  . Depression  . Medication Refill    History of presenting illness. Patient is 52 year old Caucasian female who came for her followup appointment.  She was seen on August 7 at that time she was having manic symptoms.  She was hallucinating and having fascination to play with guns and thinking to flip her car.  Her medications were adjusted.  Her Wellbutrin was reduced and she was recommended to take restaurant to 0.25 mg 3 times a day.  Patient shown some improvement from the past.  However she continues to have impulsive behavior.  She tried to cut her arm with a nail which causes superficial abrasion and no bleeding.  Patient told sometimes she feels that she enjoys doing these behavior .  She denies any suicidal thought.  She denies any hallucination at this time.  Sometimes she does feel borderline .  Prior to this behavior she called her husband and her friend however she was unable to talk to them.  Overall she is doing better , she had a good time at the beach with her husband.  She sleeping better.  She denies any aggression or any violence.  She is no longer hallucinations .  Her husband has possession of guns.  She denies any tremors or shakes.  She also brought her lithium level results which was done in March.  Her lithium level was 0.6 since then her lithium was not increased.  Her hemoglobin A1c is 4.7.  Patient that at some time drinking alcohol but denies any binge drinking and usually she drinks on social occasions.  She has any active or passive suicidal pot.  She is compliant with her Klonopin, risperidone, lithium and Wellbutrin.  She continues to be involved in charity program and goes to pot at club chapter in [place].    Past  psychiatric history. The patient has significant history of bipolar disorder but at least 5 psychiatric inpatient treatment.  Patient has no history of suicidal attempt but admitted history of passive suicidal thinking and severe manic episode.  In the past she had tried Zyprexa Seroquel however she is very resistant and reluctant to take any medication that can cause weight gain.  She was tried tried Tegretol however she developed increased paranoia confusion and she [facility].    Allergies: Allergies  Allergen Reactions  . Tegretol [Carbamazepine] Nausea And Vomiting  . Codeine Nausea Only  . Divalproex Sodium Swelling  . Erythromycin Nausea And Vomiting  . Phenytoin Sodium Extended Swelling   Medical History: Past Medical History  Diagnosis Date  . HTN (hypertension)   . Mania   . Bipolar disorder   . Anxiety    she is to take antihypertensive medication but no longer says her perpetrator has been fairly stable.  Surgical History: Past Surgical History  Procedure Laterality Date  . Brain surgery    . Abdominal hysterectomy    . Knee surgery      right x 2  . Incontinence surgery     Family History: family history includes Alcohol abuse in her father; Drug abuse in her brother; Mental illness in her mother. There is no history of Dementia, ADD / ADHD, Anxiety disorder, Bipolar disorder, Depression,  OCD, Paranoid behavior, Schizophrenia, Seizures, Sexual abuse, Physical abuse, or Suicidality. Reviewed again tod during the visit.  Review of Systems  Constitutional: Negative.   Respiratory: Negative.   Cardiovascular: Negative.   Musculoskeletal: Negative.   Neurological: Positive for headaches. Negative for focal weakness and seizures.  Psychiatric/Behavioral: Negative for depression, suicidal ideas and substance abuse. The patient is nervous/anxious.        Irritability.    Vital Signs:BP 112/78  Pulse 76  Ht 5\' 3"  (1.6 m)   Wt 149 lb 9.6 oz (67.858 kg)  BMI 26.51 kg/m2   Lab Results:  Results for orders placed in visit on 05/13/13 (from the past 8736 hour(s))  LIPID PANEL   Collection Time    05/13/13  9:38 AM      Result Value Range   Cholesterol 174  0 - 200 mg/dL   Triglycerides 48  <811 mg/dL   HDL 69  >91 mg/dL   Total CHOL/HDL Ratio 2.5     VLDL 10  0 - 40 mg/dL   LDL Cholesterol 95  0 - 99 mg/dL  Results for orders placed during the hospital encounter of 11/13/12 (from the past 8736 hour(s))  HEPATIC FUNCTION PANEL   Collection Time    11/13/12  5:02 PM      Result Value Range   Total Protein 6.7  6.0 - 8.3 g/dL   Albumin 3.9  3.5 - 5.2 g/dL   AST 15  0 - 37 U/L   ALT 15  0 - 35 U/L   Alkaline Phosphatase 74  39 - 117 U/L   Total Bilirubin 0.6  0.3 - 1.2 mg/dL   Bilirubin, Direct 0.1  0.0 - 0.3 mg/dL   Indirect Bilirubin 0.5  0.3 - 0.9 mg/dL  URINALYSIS, ROUTINE W REFLEX MICROSCOPIC   Collection Time    11/13/12  6:51 PM      Result Value Range   Color, Urine YELLOW  YELLOW   APPearance CLOUDY (*) CLEAR   Specific Gravity, Urine 1.017  1.005 - 1.030   pH 6.5  5.0 - 8.0   Glucose, UA NEGATIVE  NEGATIVE mg/dL   Hgb urine dipstick NEGATIVE  NEGATIVE   Bilirubin Urine NEGATIVE  NEGATIVE   Ketones, ur NEGATIVE  NEGATIVE mg/dL   Protein, ur NEGATIVE  NEGATIVE mg/dL   Urobilinogen, UA 0.2  0.0 - 1.0 mg/dL   Nitrite NEGATIVE  NEGATIVE   Leukocytes, UA NEGATIVE  NEGATIVE  PREGNANCY, URINE   Collection Time    11/13/12  6:51 PM      Result Value Range   Preg Test, Ur NEGATIVE  NEGATIVE  URINE RAPID DRUG SCREEN (HOSP PERFORMED)   Collection Time    11/13/12  6:51 PM      Result Value Range   Opiates NONE DETECTED  NONE DETECTED   Cocaine NONE DETECTED  NONE DETECTED   Benzodiazepines NONE DETECTED  NONE DETECTED   Amphetamines NONE DETECTED  NONE DETECTED   Tetrahydrocannabinol NONE DETECTED  NONE DETECTED   Barbiturates NONE DETECTED  NONE DETECTED  CBC   Collection Time     11/13/12  7:45 PM      Result Value Range   WBC 8.6  4.0 - 10.5 K/uL   RBC 4.53  3.87 - 5.11 MIL/uL   Hemoglobin 14.0  12.0 - 15.0 g/dL   HCT 47.8  29.5 - 62.1 %   MCV 93.8  78.0 - 100.0 fL   MCH 30.9  26.0 -  34.0 pg   MCHC 32.9  30.0 - 36.0 g/dL   RDW 16.1  09.6 - 04.5 %   Platelets 258  150 - 400 K/uL  COMPREHENSIVE METABOLIC PANEL   Collection Time    11/13/12  7:45 PM      Result Value Range   Sodium 139  135 - 145 mEq/L   Potassium 3.8  3.5 - 5.1 mEq/L   Chloride 102  96 - 112 mEq/L   CO2 25  19 - 32 mEq/L   Glucose, Bld 94  70 - 99 mg/dL   BUN 7  6 - 23 mg/dL   Creatinine, Ser 4.09  0.50 - 1.10 mg/dL   Calcium 81.1  8.4 - 91.4 mg/dL   Total Protein 6.8  6.0 - 8.3 g/dL   Albumin 3.9  3.5 - 5.2 g/dL   AST 18  0 - 37 U/L   ALT 16  0 - 35 U/L   Alkaline Phosphatase 73  39 - 117 U/L   Total Bilirubin 0.6  0.3 - 1.2 mg/dL   GFR calc non Af Amer 69 (*) >90 mL/min   GFR calc Af Amer 80 (*) >90 mL/min  HEMOGLOBIN A1C   Collection Time    11/13/12  7:45 PM      Result Value Range   Hemoglobin A1C 5.0  <5.7 %   Mean Plasma Glucose 97  <117 mg/dL  TSH   Collection Time    11/13/12  7:45 PM      Result Value Range   TSH 1.786  0.350 - 4.500 uIU/mL  ETHANOL   Collection Time    11/13/12  7:45 PM      Result Value Range   Alcohol, Ethyl (B) <11  0 - 11 mg/dL  LITHIUM LEVEL   Collection Time    11/13/12  7:45 PM      Result Value Range   Lithium Lvl 0.75 (*) 0.80 - 1.40 mEq/L  TSH   Collection Time    11/17/12  6:20 AM      Result Value Range   TSH 1.268  0.350 - 4.500 uIU/mL  LITHIUM LEVEL   Collection Time    11/17/12  6:20 AM      Result Value Range   Lithium Lvl 0.60 (*) 0.80 - 1.40 mEq/L  BASIC METABOLIC PANEL   Collection Time    11/17/12  6:20 AM      Result Value Range   Sodium 137  135 - 145 mEq/L   Potassium 3.7  3.5 - 5.1 mEq/L   Chloride 102  96 - 112 mEq/L   CO2 26  19 - 32 mEq/L   Glucose, Bld 97  70 - 99 mg/dL   BUN 11  6 - 23 mg/dL    Creatinine, Ser 7.82  0.50 - 1.10 mg/dL   Calcium 9.8  8.4 - 95.6 mg/dL   GFR calc non Af Amer >90  >90 mL/min   GFR calc Af Amer >90  >90 mL/min   She has another lithium level and HbAiC which was drawn on March 25th, 2014.  Her lithium level is 0.6 and her hemoglobin A1c is 4.7.  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  Patient is showing much improvement however she continued to have impulsive behavior by cutting superficially  with nail.  Patient told these are not suicidal attempts and after having impulsive behavior.  Patient still has some manic symptoms.  I will lower her Wellbutrin to 150 mg daily and increase her lithium to 1500 mg a day.  We will repeat another lithium level in 4 weeks the patient is not interested in any counseling and therapy.  She is taking Risperdal 0.25 mg twice a day despite recommended take 3 times a day.  Patient does take 3 times a day is making her groggy and sleepy.  We will continue Risperdal 0.25 mg twice a day at this time.  Discuss her maladaptive behavior and para suicidal gestures.  Recommend to call us back if she has any questions or concerns.  I will continue Klonopin at present dose.  Followup in 4 weeks.Time spent 25 minutes.  More than 50% of the time spent in psychoeducation, counseling and coordination of care.  Discuss safety plan that anytime having active suicidal thoughts or homicidal thoughts then patient need to call 911 or go to the local emergency room.   MEDICATIONS this encounter: Meds ordered this encounter  Medications  . risperiDONE (RISPERDAL) 0.25 MG tablet    Sig: Take 1 tab twice a day    Dispense:  60 tablet    Refill:  0  . risperiDONE (RISPERDAL) 2 MG tablet    Sig: Take 1 tablet (2 mg total) by mouth at bedtime. For mood control    Dispense:   30 tablet    Refill:  0    Order Specific Question:  Supervising Provider    Answer:  Geoffery Lyons A [[id number]]  . lithium 600 MG capsule    Sig: Take 1 capsule (600 mg total) by mouth 2 (two) times daily with a meal. For mood stabilization    Dispense:  60 capsule    Refill:  0    Order Specific Question:  Supervising Provider    Answer:  Geoffery Lyons A [[id number]]  . buPROPion (WELLBUTRIN XL) 150 MG 24 hr tablet    Sig: Take 1 tablet (150 mg total) by mouth every morning.    Dispense:  30 tablet    Refill:  0  . clonazePAM (KLONOPIN) 1 MG tablet    Sig: Take 1 and 1/2 at bed time    Dispense:  45 tablet    Refill:  0    Order Specific Question:  Supervising Provider    Answer:  Geoffery Lyons A [[id number]]  . lithium 300 MG tablet    Sig: Take 1 tablet (300 mg total) by mouth daily.    Dispense:  30 tablet    Refill:  0   Medical Decision Making Problem Points:  Established problem, worsening (2), New problem, with additional work-up planned (4), Review of last therapy session (1) and Review of psycho-social stressors (1) Data Points:  Review or order clinical lab tests (1) Review of medication regiment & side effects (2) Review of new medications or change in dosage (2)  Sue Rabel T., MD

## 2013-08-29 ENCOUNTER — Encounter (HOSPITAL_COMMUNITY): Payer: Self-pay

## 2013-08-29 ENCOUNTER — Emergency Department (HOSPITAL_COMMUNITY)
Admission: EM | Admit: 2013-08-29 | Discharge: 2013-08-29 | Disposition: A | Discharge status: Home / Self Care | Payer: PRIVATE HEALTH INSURANCE | Attending: Emergency Medicine | Admitting: Emergency Medicine

## 2013-08-29 ENCOUNTER — Emergency Department (HOSPITAL_COMMUNITY): Payer: PRIVATE HEALTH INSURANCE

## 2013-08-29 DIAGNOSIS — I10 Essential (primary) hypertension: Secondary | ICD-10-CM | POA: Insufficient documentation

## 2013-08-29 DIAGNOSIS — X500XXA Overexertion from strenuous movement or load, initial encounter: Secondary | ICD-10-CM | POA: Insufficient documentation

## 2013-08-29 DIAGNOSIS — Z79899 Other long term (current) drug therapy: Secondary | ICD-10-CM | POA: Insufficient documentation

## 2013-08-29 DIAGNOSIS — IMO0002 Reserved for concepts with insufficient information to code with codable children: Secondary | ICD-10-CM | POA: Insufficient documentation

## 2013-08-29 DIAGNOSIS — Y9301 Activity, walking, marching and hiking: Secondary | ICD-10-CM | POA: Insufficient documentation

## 2013-08-29 DIAGNOSIS — Z9889 Other specified postprocedural states: Secondary | ICD-10-CM | POA: Insufficient documentation

## 2013-08-29 DIAGNOSIS — S8392XA Sprain of unspecified site of left knee, initial encounter: Secondary | ICD-10-CM

## 2013-08-29 DIAGNOSIS — F411 Generalized anxiety disorder: Secondary | ICD-10-CM | POA: Insufficient documentation

## 2013-08-29 DIAGNOSIS — F309 Manic episode, unspecified: Secondary | ICD-10-CM | POA: Insufficient documentation

## 2013-08-29 DIAGNOSIS — Y9289 Other specified places as the place of occurrence of the external cause: Secondary | ICD-10-CM | POA: Insufficient documentation

## 2013-08-29 NOTE — ED Notes (Signed)
Ace wrap applied to left knee

## 2013-08-29 NOTE — ED Notes (Signed)
Pt reports left knee pain since 1pm today. Denies any known injury

## 2013-08-29 NOTE — ED Provider Notes (Signed)
CSN: [id number]     Arrival date & time 08/29/13  1621 History   First MD Initiated Contact with Patient 08/29/13 1628     Chief Complaint  Patient presents with  . Knee Pain   (Consider location/radiation/quality/duration/timing/severity/associated sxs/prior Treatment) Patient is a 52 y.o. female presenting with knee pain. The history is provided by the patient.  Knee Pain Location:  Knee Time since incident:  3 hours Injury: yes   Mechanism of injury comment:  Twisted her knee while stepping over a gate Knee location:  L knee Pain details:    Quality:  Aching   Radiates to:  L leg   Severity:  Mild   Onset quality:  Sudden   Timing:  Constant   Progression:  Unchanged Chronicity:  New Dislocation: no   Foreign body present:  No foreign bodies Prior injury to area:  No Relieved by:  Nothing Worsened by:  Bearing weight Ineffective treatments:  Ice Associated symptoms: no decreased ROM, no fever, no numbness, no stiffness, no swelling and no tingling     Past Medical History  Diagnosis Date  . HTN (hypertension)   . Mania   . Bipolar disorder   . Anxiety    Past Surgical History  Procedure Laterality Date  . Brain surgery    . Abdominal hysterectomy    . Knee surgery      right x 2  . Incontinence surgery     Family History  Problem Relation Age of Onset  . Alcohol abuse Father   . Drug abuse Brother   . Dementia Neg Hx   . ADD / ADHD Neg Hx   . Anxiety disorder Neg Hx   . Bipolar disorder Neg Hx   . Depression Neg Hx   . OCD Neg Hx   . Paranoid behavior Neg Hx   . Schizophrenia Neg Hx   . Seizures Neg Hx   . Sexual abuse Neg Hx   . Physical abuse Neg Hx   . Suicidality Neg Hx   . Mental illness Mother    History  Substance Use Topics  . Smoking status: Never Smoker   . Smokeless tobacco: Never Used  . Alcohol Use: No   OB History   Grav Para Term Preterm Abortions TAB SAB Ect Mult Living                 Review of Systems  Constitutional:  Negative for fever and chills.  Genitourinary: Negative for dysuria and difficulty urinating.  Musculoskeletal: Positive for joint swelling and arthralgias. Negative for stiffness.  Skin: Negative for color change and wound.  All other systems reviewed and are negative.    Allergies  Tegretol; Codeine; Divalproex sodium; Erythromycin; and Phenytoin sodium extended  Home Medications   Current Outpatient Rx  Name  Route  Sig  Dispense  Refill  . buPROPion (WELLBUTRIN XL) 150 MG 24 hr tablet   Oral   Take 1 tablet (150 mg total) by mouth every morning.   30 tablet   0   . clonazePAM (KLONOPIN) 1 MG tablet      Take 1 and 1/2 at bed time   45 tablet   0   . lithium 300 MG tablet   Oral   Take 1 tablet (300 mg total) by mouth daily.   30 tablet   0   . lithium 600 MG capsule   Oral   Take 1 capsule (600 mg total) by mouth 2 (two) times daily  with a meal. For mood stabilization   60 capsule   0   . risperiDONE (RISPERDAL) 0.25 MG tablet      Take 1 tab twice a day   60 tablet   0   . risperiDONE (RISPERDAL) 2 MG tablet   Oral   Take 1 tablet (2 mg total) by mouth at bedtime. For mood control   30 tablet   0    BP 117/81  Pulse 84  Temp(Src) 98.5 F (36.9 C) (Oral)  Resp 18  Ht 5\' 3"  (1.6 m)  Wt 147 lb (66.679 kg)  BMI 26.05 kg/m2  SpO2 99% Physical Exam  Nursing note and vitals reviewed. Constitutional: She is oriented to person, place, and time. She appears well-developed and well-nourished. No distress.  Cardiovascular: Normal rate, regular rhythm, normal heart sounds and intact distal pulses.   Pulmonary/Chest: Effort normal and breath sounds normal. No respiratory distress.  Musculoskeletal: She exhibits tenderness. She exhibits no edema.       Left knee: She exhibits normal range of motion, no swelling, no effusion, no ecchymosis, no deformity, no laceration, no erythema, normal alignment, no LCL laxity, normal patellar mobility and no bony  tenderness. Tenderness found. Lateral joint line tenderness noted. No patellar tendon tenderness noted.       Legs: ttp of the lateral left knee.  No erythema, effusion, or step-off deformity.  No patella tenderness or crepitus.  DP pulse brisk, distal sensation intact. Pt has full ROM of the knee with pain reproduced on flexion.  Calf is soft and NT.  Neurological: She is alert and oriented to person, place, and time. She exhibits normal muscle tone. Coordination normal.  Skin: Skin is warm and dry. No erythema.    ED Course  Procedures (including critical care time) Labs Review Labs Reviewed - No data to display Imaging Review Dg Knee Complete 4 Views Left  08/29/2013   *RADIOLOGY REPORT*  Clinical Data: Knee pain  LEFT KNEE - COMPLETE 4+ VIEW  Comparison: None  Findings: There is no evidence of fracture or dislocation.  There is no evidence of arthropathy or other focal bone abnormality. Soft tissues are unremarkable.  IMPRESSION: Negative exam.   Original Report Authenticated By: Signa Kell, M.D.    MDM   Twisting injury to left knee that occurred several hrs prior to ED arrival, no fall.  Likely sprain but meniscus injury also possible.  Advised pt of x-ray findings and importance of close ortho f/u if not improving.  She verbalized understanding and agrees to care plan and OTC NSAID for pain  ACE wrap applied, pain improved, remains NV intact.  Wear precautions also given    Roshun Klingensmith L. Trisha Mangle, PA-C 08/29/13 1730

## 2013-08-31 ENCOUNTER — Ambulatory Visit (HOSPITAL_COMMUNITY): Payer: Self-pay | Admitting: Psychiatry

## 2013-08-31 NOTE — ED Provider Notes (Signed)
Medical screening examination/treatment/procedure(s) were performed by non-physician practitioner and as supervising physician I was immediately available for consultation/collaboration.   Laray Anger, DO 08/31/13 2132

## 2013-09-07 ENCOUNTER — Telehealth (HOSPITAL_COMMUNITY): Payer: Self-pay

## 2013-09-07 ENCOUNTER — Encounter (HOSPITAL_COMMUNITY): Payer: Self-pay | Admitting: Emergency Medicine

## 2013-09-07 ENCOUNTER — Telehealth (HOSPITAL_COMMUNITY): Payer: Self-pay | Admitting: Psychiatry

## 2013-09-07 ENCOUNTER — Emergency Department (HOSPITAL_COMMUNITY)
Admission: EM | Admit: 2013-09-07 | Discharge: 2013-09-08 | Disposition: A | Discharge status: Other Acute Inpatient Hospital | Payer: PRIVATE HEALTH INSURANCE | Attending: Emergency Medicine | Admitting: Emergency Medicine

## 2013-09-07 DIAGNOSIS — F411 Generalized anxiety disorder: Secondary | ICD-10-CM | POA: Insufficient documentation

## 2013-09-07 DIAGNOSIS — G939 Disorder of brain, unspecified: Secondary | ICD-10-CM

## 2013-09-07 DIAGNOSIS — I10 Essential (primary) hypertension: Secondary | ICD-10-CM | POA: Insufficient documentation

## 2013-09-07 DIAGNOSIS — F3162 Bipolar disorder, current episode mixed, moderate: Secondary | ICD-10-CM | POA: Insufficient documentation

## 2013-09-07 DIAGNOSIS — F0634 Mood disorder due to known physiological condition with mixed features: Secondary | ICD-10-CM

## 2013-09-07 DIAGNOSIS — R45851 Suicidal ideations: Secondary | ICD-10-CM | POA: Insufficient documentation

## 2013-09-07 DIAGNOSIS — X789XXA Intentional self-harm by unspecified sharp object, initial encounter: Secondary | ICD-10-CM | POA: Insufficient documentation

## 2013-09-07 DIAGNOSIS — S61509A Unspecified open wound of unspecified wrist, initial encounter: Secondary | ICD-10-CM | POA: Insufficient documentation

## 2013-09-07 DIAGNOSIS — F3132 Bipolar disorder, current episode depressed, moderate: Secondary | ICD-10-CM

## 2013-09-07 DIAGNOSIS — Z79899 Other long term (current) drug therapy: Secondary | ICD-10-CM | POA: Insufficient documentation

## 2013-09-07 HISTORY — DX: Depression, unspecified: F32.A

## 2013-09-07 HISTORY — DX: Major depressive disorder, single episode, unspecified: F32.9

## 2013-09-07 LAB — RAPID URINE DRUG SCREEN, HOSP PERFORMED
Amphetamines: NOT DETECTED
Barbiturates: NOT DETECTED
Benzodiazepines: NOT DETECTED
Cocaine: NOT DETECTED
Opiates: NOT DETECTED
Tetrahydrocannabinol: NOT DETECTED

## 2013-09-07 LAB — URINALYSIS, ROUTINE W REFLEX MICROSCOPIC
Bilirubin Urine: NEGATIVE
Glucose, UA: NEGATIVE mg/dL
Hgb urine dipstick: NEGATIVE
Ketones, ur: NEGATIVE mg/dL
Leukocytes, UA: NEGATIVE
Nitrite: NEGATIVE
Protein, ur: NEGATIVE mg/dL
Specific Gravity, Urine: 1.015 (ref 1.005–1.030)
Urobilinogen, UA: 0.2 mg/dL (ref 0.0–1.0)
pH: 7.5 (ref 5.0–8.0)

## 2013-09-07 LAB — COMPREHENSIVE METABOLIC PANEL
ALT: 16 U/L (ref 0–35)
AST: 15 U/L (ref 0–37)
Albumin: 3.9 g/dL (ref 3.5–5.2)
Alkaline Phosphatase: 72 U/L (ref 39–117)
BUN: 11 mg/dL (ref 6–23)
CO2: 24 mEq/L (ref 19–32)
Calcium: 9.7 mg/dL (ref 8.4–10.5)
Chloride: 101 mEq/L (ref 96–112)
Creatinine, Ser: 0.67 mg/dL (ref 0.50–1.10)
GFR calc Af Amer: >90 mL/min (ref >90)
GFR calc non Af Amer: >90 mL/min (ref >90)
Glucose, Bld: 93 mg/dL (ref 70–99)
Potassium: 4.1 mEq/L (ref 3.5–5.1)
Sodium: 134 mEq/L — ABNORMAL LOW (ref 135–145)
Total Bilirubin: 0.8 mg/dL (ref 0.3–1.2)
Total Protein: 6.8 g/dL (ref 6.0–8.3)

## 2013-09-07 LAB — SALICYLATE LEVEL: Salicylate Lvl: <2 mg/dL — ABNORMAL LOW (ref 2.8–20.0)

## 2013-09-07 LAB — CBC
HCT: 45.2 % (ref 36.0–46.0)
Hemoglobin: 15.4 g/dL — ABNORMAL HIGH (ref 12.0–15.0)
MCH: 32 pg (ref 26.0–34.0)
MCHC: 34.1 g/dL (ref 30.0–36.0)
MCV: 93.8 fL (ref 78.0–100.0)
Platelets: 290 10*3/uL (ref 150–400)
RBC: 4.82 MIL/uL (ref 3.87–5.11)
RDW: 12.2 % (ref 11.5–15.5)
WBC: 9.1 10*3/uL (ref 4.0–10.5)

## 2013-09-07 LAB — ETHANOL: Alcohol, Ethyl (B): <11 mg/dL (ref 0–11)

## 2013-09-07 LAB — ACETAMINOPHEN LEVEL: Acetaminophen (Tylenol), Serum: <15 ug/mL (ref 10–30)

## 2013-09-07 MED ORDER — IBUPROFEN 200 MG PO TABS
600.0000 mg | ORAL_TABLET | Freq: Three times a day (TID) | ORAL | Status: DC | PRN
  Administered 2013-09-07: 600 mg via ORAL
  Filled 2013-09-07: qty 3

## 2013-09-07 MED ORDER — ADULT MULTIVITAMIN W/MINERALS CH
1.0000 | ORAL_TABLET | Freq: Every day | ORAL | Status: DC
  Administered 2013-09-08: 1 via ORAL
  Filled 2013-09-07: qty 1

## 2013-09-07 MED ORDER — ONDANSETRON HCL 4 MG PO TABS: 4.0000 mg | ORAL_TABLET | Freq: Three times a day (TID) | ORAL | Status: DC | PRN

## 2013-09-07 MED ORDER — ACETAMINOPHEN 325 MG PO TABS: 650.0000 mg | ORAL_TABLET | ORAL | Status: DC | PRN

## 2013-09-07 MED ORDER — LORAZEPAM 1 MG PO TABS: 1.0000 mg | ORAL_TABLET | Freq: Three times a day (TID) | ORAL | Status: DC | PRN

## 2013-09-07 MED ORDER — ZOLPIDEM TARTRATE 5 MG PO TABS
5.0000 mg | ORAL_TABLET | Freq: Every evening | ORAL | Status: DC | PRN
  Administered 2013-09-07: 5 mg via ORAL
  Filled 2013-09-07: qty 1

## 2013-09-07 MED ORDER — CLONAZEPAM 1 MG PO TABS
1.5000 mg | ORAL_TABLET | Freq: Every evening | ORAL | Status: DC | PRN
  Administered 2013-09-07: 1.5 mg via ORAL
  Filled 2013-09-07 (×3): qty 1

## 2013-09-07 NOTE — BH Assessment (Signed)
Assessment Note  [name] is an 52 y.o. female. Pt presents with C/O Depression SIB, and SI.  Pt reports that she contacted her psychiatrist Dr. Lolly Mustache today to inform him of her situation and he recommended that she present to the hospital for an evaluation. Pt reports that she started cutting on her wrist with a metal nail 3 weeks ago. Pt reports that she has never been a cutter until 3 weeks ago. Pt appears to have scars on her wrist from cutting.Pt reports that she last cut on her wrist yesterday which triggered  Suicidal Ideations. Pt reports AH 3 days ago, voices telling her to get a shovel out the garage and hit her hand with it. Pt denies current AVH, and HI. Pt reports feeling depressed for the past month. Pt reports  that she feels that she does not give a "damn", and feels that she is no good to anybody. Pt is unable to specify why she feels this way. Pt reports a recent medication change by her psychiatrist 2 weeks ago. Pt reports that she was prescribed Lithium @300mg , Risperal  @.25mg , Wellbutrin  was decreased to 150mg . Pt reports despite her recent  medication adjustments  she does not feel any better and is requesting inpatient treatment for a medication evaluation and to help her enjoy life again. Pt is unable to contract for safety and inpatient treatment recommended for safety and stabilization.  Consulted with NP Janann August who agreed to accept pt for admission at [facility] once a bed becomes available.  Axis I: Major Depression, Recurrent severe Axis II: Deferred Axis III:  Past Medical History  Diagnosis Date  . HTN (hypertension)   . Mania   . Bipolar disorder   . Anxiety   . Depression    Axis IV: other psychosocial or environmental problems and problems related to social environment Axis V: 31-40 impairment in reality testing  Past Medical History:  Past Medical History  Diagnosis Date  . HTN (hypertension)   . Mania   . Bipolar disorder   . Anxiety   .  Depression     Past Surgical History  Procedure Laterality Date  . Brain surgery    . Abdominal hysterectomy    . Knee surgery      right x 2  . Incontinence surgery      Family History:  Family History  Problem Relation Age of Onset  . Alcohol abuse Father   . Drug abuse Brother   . Dementia Neg Hx   . ADD / ADHD Neg Hx   . Anxiety disorder Neg Hx   . Bipolar disorder Neg Hx   . Depression Neg Hx   . OCD Neg Hx   . Paranoid behavior Neg Hx   . Schizophrenia Neg Hx   . Seizures Neg Hx   . Sexual abuse Neg Hx   . Physical abuse Neg Hx   . Suicidality Neg Hx   . Mental illness Mother     Social History:  reports that she has never smoked. She has never used smokeless tobacco. She reports that she does not drink alcohol or use illicit drugs.  Additional Social History:  Alcohol / Drug Use History of alcohol / drug use?: No history of alcohol / drug abuse  CIWA: CIWA-Ar BP: 123/81 mmHg Pulse Rate: 84 COWS:    Allergies:  Allergies  Allergen Reactions  . Tegretol [Carbamazepine] Nausea And Vomiting  . Codeine Nausea Only  . Divalproex Sodium  Swelling  . Erythromycin Nausea And Vomiting  . Phenytoin Sodium Extended Swelling    Home Medications:  (Not in a hospital admission)  OB/GYN Status:  No LMP recorded. Patient has had a hysterectomy.  General Assessment Data Location of Assessment: [facility] Is this a Tele or Face-to-Face Assessment?: Face-to-Face Is this an Initial Assessment or a Re-assessment for this encounter?: Initial Assessment Living Arrangements: Spouse/significant other Can pt return to current living arrangement?: Yes Admission Status: Voluntary Is patient capable of signing voluntary admission?: Yes Transfer from: Home Referral Source: Psychiatrist     [facility] Crisis Care Plan Living Arrangements: Spouse/significant other Name of Psychiatrist: Dr. Kathryne Sharper Name of Therapist: No Current Provider     Risk to self Suicidal Ideation:  Yes-Currently Present Suicidal Intent: Yes-Currently Present Is patient at risk for suicide?: Yes Suicidal Plan?: No Access to Means: Yes Specify Access to Suicidal Means: sharps eg., nails What has been your use of drugs/alcohol within the last 12 months?: none reported Previous Attempts/Gestures: No How many times?:  (pt reports having thoughts and plans prior,never acted on ) Other Self Harm Risks: cutting Triggers for Past Attempts: Unpredictable Intentional Self Injurious Behavior: Cutting Comment - Self Injurious Behavior: pt cut wrist with a metal nail yesterday Family Suicide History: No Recent stressful life event(s): Other (Comment) (chronic depression) Persecutory voices/beliefs?: No Depression: Yes Depression Symptoms: Fatigue;Loss of interest in usual pleasures;Feeling worthless/self pity;Feeling angry/irritable Substance abuse history and/or treatment for substance abuse?: No Suicide prevention information given to non-admitted patients: Not applicable  Risk to Others Homicidal Ideation: No Thoughts of Harm to Others: No Current Homicidal Intent: No Current Homicidal Plan: No Access to Homicidal Means: No Identified Victim: na History of harm to others?: No Assessment of Violence: None Noted Violent Behavior Description: None Noted Does patient have access to weapons?: No Criminal Charges Pending?: No Does patient have a court date: No  Psychosis Hallucinations: Auditory (most recent episode of AH on 09-04-13) Delusions: None noted  Mental Status Report Appear/Hygiene: Other (Comment) (pt dressed in hospital scrubs) Eye Contact: Fair Motor Activity: Freedom of movement Speech: Logical/coherent Level of Consciousness: Alert Mood: Depressed;Anxious Affect: Anxious;Depressed Anxiety Level: Minimal (pt is adamant about going to [facility] for treatment ) Thought Processes: Coherent;Relevant Judgement: Unimpaired Orientation: Person;Place;Time;Situation Obsessive  Compulsive Thoughts/Behaviors: None  Cognitive Functioning Concentration: Normal Memory: Recent Intact;Remote Intact IQ: Average Insight: Poor Impulse Control: Poor Appetite: Good Weight Loss: 0 Weight Gain: 2 Sleep: No Change Total Hours of Sleep: 12 Vegetative Symptoms: None  ADLScreening [facility] Assessment Services) Patient's cognitive ability adequate to safely complete daily activities?: Yes Patient able to express need for assistance with ADLs?: Yes Independently performs ADLs?: Yes (appropriate for developmental age)  Prior Inpatient Therapy Prior Inpatient Therapy: Yes Prior Therapy Dates: 4 or 5 prior admission at [facility]  Prior Therapy Facilty/Provider(s): [facility] Reason for Treatment: Depression,Bipolar, PTSD  Prior Outpatient Therapy Prior Outpatient Therapy: Yes Prior Therapy Dates: Current Provider Prior Therapy Facilty/Provider(s): Dr. Lolly Mustache Reason for Treatment: Depression, Bipolar, Medication Management  ADL Screening (condition at time of admission) Patient's cognitive ability adequate to safely complete daily activities?: Yes Is the patient deaf or have difficulty hearing?: No Does the patient have difficulty seeing, even when wearing glasses/contacts?: No Does the patient have difficulty concentrating, remembering, or making decisions?: No Patient able to express need for assistance with ADLs?: Yes Does the patient have difficulty dressing or bathing?: No Independently performs ADLs?: Yes (appropriate for developmental age) Does the patient have difficulty walking or climbing  stairs?: No Weakness of Legs: None Weakness of Arms/Hands: None  Home Assistive Devices/Equipment Home Assistive Devices/Equipment: None    Abuse/Neglect Assessment (Assessment to be complete while patient is alone) Physical Abuse: Denies Sexual Abuse: Denies Exploitation of patient/patient's resources: Denies Self-Neglect: Denies     Merchant navy officer (For  Healthcare) Advance Directive: Patient does not have advance directive;Patient would not like information    Additional Information 1:1 In Past 12 Months?: No CIRT Risk: No Elopement Risk: No Does patient have medical clearance?: Yes     Disposition:  Disposition Initial Assessment Completed for this Encounter: Yes Disposition of Patient: Inpatient treatment program Type of inpatient treatment program: Adult Other disposition(s): Other (Comment) (Pt accepted to [facility] per NP Janann August once bed is availabl )  On Site Evaluation by:   Reviewed with Physician:    Gerline Legacy, MS, LCASA Assessment Counselor  09/07/2013 9:37 PM

## 2013-09-07 NOTE — ED Provider Notes (Signed)
CSN: [id number]     Arrival date & time 09/07/13  1352 History  This chart was scribed for non-physician practitioner, Magnus Sinning, PA-C working with Vida Roller, MD by Greggory Stallion, ED scribe. This patient was seen in room [facility]TR2/[facility]PT[facility] and the patient's care was started at 3:13 PM.   Chief Complaint  Patient presents with  . Medical Clearance   The history is provided by the patient. No language interpreter was used.    HPI Comments: [name] is a 52 y.o. female who presents to the Emergency Department complaining of gradually worsening depression and SI that started about 2 weeks ago. She cut her wrists with a screw yesterday. Pt states she was doing it at first to see if it would hurt then she started doing it to actually hurt herself. Pt is here voluntarily but her psychiatrist called here to have her admitted to [facility]. She has been on Wellbutrin and lithium but states they are not helping. She denies any physical symptoms currently. Pt denies any drug or alcohol use. She states her tetanus is UTD.   Psychiatrist is Dr. Lolly Mustache  Past Medical History  Diagnosis Date  . HTN (hypertension)   . Mania   . Bipolar disorder   . Anxiety    Past Surgical History  Procedure Laterality Date  . Brain surgery    . Abdominal hysterectomy    . Knee surgery      right x 2  . Incontinence surgery     Family History  Problem Relation Age of Onset  . Alcohol abuse Father   . Drug abuse Brother   . Dementia Neg Hx   . ADD / ADHD Neg Hx   . Anxiety disorder Neg Hx   . Bipolar disorder Neg Hx   . Depression Neg Hx   . OCD Neg Hx   . Paranoid behavior Neg Hx   . Schizophrenia Neg Hx   . Seizures Neg Hx   . Sexual abuse Neg Hx   . Physical abuse Neg Hx   . Suicidality Neg Hx   . Mental illness Mother    History  Substance Use Topics  . Smoking status: Never Smoker   . Smokeless tobacco: Never Used  . Alcohol Use: No   OB History   Grav Para Term Preterm Abortions TAB SAB  Ect Mult Living                 Review of Systems  Psychiatric/Behavioral: Positive for suicidal ideas and self-injury.  All other systems reviewed and are negative.    Allergies  Tegretol; Codeine; Divalproex sodium; Erythromycin; and Phenytoin sodium extended  Home Medications   Current Outpatient Rx  Name  Route  Sig  Dispense  Refill  . buPROPion (WELLBUTRIN XL) 150 MG 24 hr tablet   Oral   Take 1 tablet (150 mg total) by mouth every morning.   30 tablet   0   . clonazePAM (KLONOPIN) 1 MG tablet   Oral   Take 1.5 mg by mouth at bedtime as needed for anxiety.         Marland Kitchen lithium 300 MG tablet   Oral   Take 1 tablet (300 mg total) by mouth daily.   30 tablet   0   . lithium 600 MG capsule   Oral   Take 1 capsule (600 mg total) by mouth 2 (two) times daily with a meal. For mood stabilization   60 capsule  0   . Multiple Vitamin (MULTIVITAMIN WITH MINERALS) TABS tablet   Oral   Take 1 tablet by mouth daily.         . risperiDONE (RISPERDAL) 0.25 MG tablet      Take 1 tab twice a day   60 tablet   0   . risperiDONE (RISPERDAL) 2 MG tablet   Oral   Take 1 tablet (2 mg total) by mouth at bedtime. For mood control   30 tablet   0    BP 118/82  Pulse 87  Temp(Src) 98.7 F (37.1 C)  Resp 20  SpO2 99%  Physical Exam  Nursing note and vitals reviewed. Constitutional: She appears well-developed and well-nourished.  HENT:  Head: Normocephalic and atraumatic.  Mouth/Throat: Oropharynx is clear and moist.  Eyes: EOM are normal. Pupils are equal, round, and reactive to light.  Neck: Normal range of motion. Neck supple.  Cardiovascular: Normal rate, regular rhythm and normal heart sounds.   2+ radial pulses  Pulmonary/Chest: Effort normal and breath sounds normal. No respiratory distress. She has no wheezes. She has no rales.  Musculoskeletal: Normal range of motion.  Neurological: She is alert.  Skin: Skin is warm and dry.  3 superficial linear  abrasions on volar aspect of left wrist.   Psychiatric: Her speech is normal and behavior is normal. She is not actively hallucinating. She exhibits a depressed mood. She expresses suicidal ideation. She expresses no homicidal ideation. She expresses suicidal plans. She expresses no homicidal plans.    ED Course  Procedures (including critical care time)  DIAGNOSTIC STUDIES: Oxygen Saturation is 99% on RA, normal by my interpretation.    COORDINATION OF CARE: 3:17 PM-Discussed treatment plan which includes moving to the back with pt at bedside and pt agreed to plan.   Labs Review Labs Reviewed  CBC - Abnormal; Notable for the following:    Hemoglobin 15.4 (*)    All other components within normal limits  COMPREHENSIVE METABOLIC PANEL - Abnormal; Notable for the following:    Sodium 134 (*)    All other components within normal limits  SALICYLATE LEVEL - Abnormal; Notable for the following:    Salicylate Lvl <2.0 (*)    All other components within normal limits  ACETAMINOPHEN LEVEL  ETHANOL  URINE RAPID DRUG SCREEN (HOSP PERFORMED)  URINALYSIS, ROUTINE W REFLEX MICROSCOPIC   Imaging Review No results found.  MDM  No diagnosis found. Patient presents today with depression and suicidal thoughts.  She reports that plan to cut self.  She denies HI.  Psych consult has been placed.  Psych holding orders have been placed.  I personally performed the services described in this documentation, which was scribed in my presence. The recorded information has been reviewed and is accurate.   Pascal Lux Linton, PA-C 09/07/13 [phone]

## 2013-09-07 NOTE — ED Notes (Signed)
pts belongings have been wanded and husband took all belongings.

## 2013-09-07 NOTE — Telephone Encounter (Signed)
Patient called return.  Patient complaining of suicidal thoughts and hearing voices.  She admitted thoughts are very severe and intense.  She admitted crying spells and feeling hopeless.  Recommended to come to emergency room at [facility] for assessment.  I would inform assessment for evaluation.

## 2013-09-07 NOTE — Telephone Encounter (Signed)
11:38AM 09/07/13 Patient called stating that she is having some issues feeling like hurting herself - she called the crisis hotline but they referred her back to [place].Marland KitchenMarguerite Olea

## 2013-09-07 NOTE — ED Notes (Signed)
pts psych called to have pt admited to [facility] for depression and SI of hurtself, has been on wellbutrin and lithium but not helping any. Vol here and is calm

## 2013-09-08 ENCOUNTER — Encounter (HOSPITAL_COMMUNITY): Payer: Self-pay

## 2013-09-08 ENCOUNTER — Inpatient Hospital Stay (HOSPITAL_COMMUNITY)
Admission: AD | Admit: 2013-09-08 | Discharge: 2013-09-13 | DRG: 885 | Disposition: A | Discharge status: Home / Self Care | Payer: PRIVATE HEALTH INSURANCE | Source: Intra-hospital | Attending: Psychiatry | Admitting: Psychiatry

## 2013-09-08 DIAGNOSIS — R45851 Suicidal ideations: Secondary | ICD-10-CM | POA: Diagnosis not present

## 2013-09-08 DIAGNOSIS — I10 Essential (primary) hypertension: Secondary | ICD-10-CM | POA: Diagnosis present

## 2013-09-08 DIAGNOSIS — F3132 Bipolar disorder, current episode depressed, moderate: Secondary | ICD-10-CM

## 2013-09-08 DIAGNOSIS — F3130 Bipolar disorder, current episode depressed, mild or moderate severity, unspecified: Secondary | ICD-10-CM | POA: Diagnosis present

## 2013-09-08 DIAGNOSIS — Z79899 Other long term (current) drug therapy: Secondary | ICD-10-CM

## 2013-09-08 DIAGNOSIS — F411 Generalized anxiety disorder: Secondary | ICD-10-CM | POA: Diagnosis present

## 2013-09-08 DIAGNOSIS — F0634 Mood disorder due to known physiological condition with mixed features: Secondary | ICD-10-CM

## 2013-09-08 DIAGNOSIS — F319 Bipolar disorder, unspecified: Secondary | ICD-10-CM

## 2013-09-08 DIAGNOSIS — G939 Disorder of brain, unspecified: Secondary | ICD-10-CM

## 2013-09-08 LAB — LITHIUM LEVEL: Lithium Lvl: 0.41 mEq/L — ABNORMAL LOW (ref 0.80–1.40)

## 2013-09-08 MED ORDER — RISPERIDONE 0.5 MG PO TABS
0.2500 mg | ORAL_TABLET | Freq: Two times a day (BID) | ORAL | Status: DC
  Administered 2013-09-08: 0.25 mg via ORAL
  Filled 2013-09-08: qty 1

## 2013-09-08 MED ORDER — ACETAMINOPHEN 325 MG PO TABS: 650.0000 mg | ORAL_TABLET | Freq: Four times a day (QID) | ORAL | Status: DC | PRN

## 2013-09-08 MED ORDER — LITHIUM CARBONATE 300 MG PO CAPS
600.0000 mg | ORAL_CAPSULE | Freq: Two times a day (BID) | ORAL | Status: DC
  Administered 2013-09-08 – 2013-09-13 (×10): 600 mg via ORAL
  Filled 2013-09-08 (×13): qty 2

## 2013-09-08 MED ORDER — ADULT MULTIVITAMIN W/MINERALS CH
1.0000 | ORAL_TABLET | Freq: Every day | ORAL | Status: DC
  Administered 2013-09-09 – 2013-09-13 (×5): 1 via ORAL
  Filled 2013-09-08 (×6): qty 1

## 2013-09-08 MED ORDER — ALUM & MAG HYDROXIDE-SIMETH 200-200-20 MG/5ML PO SUSP: 30.0000 mL | ORAL | Status: DC | PRN

## 2013-09-08 MED ORDER — LITHIUM CARBONATE 300 MG PO CAPS
300.0000 mg | ORAL_CAPSULE | Freq: Every day | ORAL | Status: DC
  Administered 2013-09-09 – 2013-09-13 (×5): 300 mg via ORAL
  Filled 2013-09-08 (×5): qty 1

## 2013-09-08 MED ORDER — RISPERIDONE 0.25 MG PO TABS
0.2500 mg | ORAL_TABLET | Freq: Two times a day (BID) | ORAL | Status: DC
  Administered 2013-09-08 – 2013-09-13 (×10): 0.25 mg via ORAL
  Filled 2013-09-08 (×13): qty 1

## 2013-09-08 MED ORDER — BUPROPION HCL ER (XL) 300 MG PO TB24
300.0000 mg | ORAL_TABLET | Freq: Every morning | ORAL | Status: DC
  Administered 2013-09-09 – 2013-09-13 (×5): 300 mg via ORAL
  Filled 2013-09-08 (×8): qty 1

## 2013-09-08 MED ORDER — LITHIUM CARBONATE 300 MG PO CAPS: 600.0000 mg | ORAL_CAPSULE | Freq: Two times a day (BID) | ORAL | Status: DC

## 2013-09-08 MED ORDER — LITHIUM CARBONATE ER 300 MG PO TBCR: 300.0000 mg | EXTENDED_RELEASE_TABLET | Freq: Every day | ORAL | Status: DC

## 2013-09-08 MED ORDER — RISPERIDONE 2 MG PO TABS: 2.0000 mg | ORAL_TABLET | Freq: Every day | ORAL | Status: DC

## 2013-09-08 MED ORDER — MAGNESIUM HYDROXIDE 400 MG/5ML PO SUSP: 30.0000 mL | Freq: Every day | ORAL | Status: DC | PRN

## 2013-09-08 MED ORDER — BUPROPION HCL ER (XL) 300 MG PO TB24
300.0000 mg | ORAL_TABLET | Freq: Every morning | ORAL | Status: DC
  Administered 2013-09-08: 300 mg via ORAL
  Filled 2013-09-08 (×2): qty 1

## 2013-09-08 MED ORDER — LITHIUM CARBONATE 300 MG PO CAPS
300.0000 mg | ORAL_CAPSULE | Freq: Every day | ORAL | Status: DC
Start: 2013-09-08 — End: 2013-09-08
  Administered 2013-09-08: 300 mg via ORAL
  Filled 2013-09-08: qty 1

## 2013-09-08 MED ORDER — RISPERIDONE 2 MG PO TABS
2.0000 mg | ORAL_TABLET | Freq: Every day | ORAL | Status: DC
  Administered 2013-09-08 – 2013-09-12 (×5): 2 mg via ORAL
  Filled 2013-09-08 (×7): qty 1

## 2013-09-08 MED ORDER — CLONAZEPAM 1 MG PO TABS
1.5000 mg | ORAL_TABLET | Freq: Every evening | ORAL | Status: DC | PRN
  Administered 2013-09-08 – 2013-09-12 (×5): 1.5 mg via ORAL
  Filled 2013-09-08 (×5): qty 1

## 2013-09-08 NOTE — Tx Team (Signed)
Initial Interdisciplinary Treatment Plan  PATIENT STRENGTHS: (choose at least two) Ability for insight Active sense of humor Capable of independent living  PATIENT STRESSORS: Medication change or noncompliance   PROBLEM LIST: Problem List/Patient Goals Date to be addressed Date deferred Reason deferred Estimated date of resolution  Medication adjustment 09/08/2013     SI 09/08/2013                                                DISCHARGE CRITERIA:  Ability to meet basic life and health needs Adequate post-discharge living arrangements Improved stabilization in mood, thinking, and/or behavior Verbal commitment to aftercare and medication compliance  PRELIMINARY DISCHARGE PLAN: Attend aftercare/continuing care group Attend PHP/IOP  PATIENT/FAMIILY INVOLVEMENT: This treatment plan has been presented to and reviewed with the patient, [name], and/or family member.  The patient and family have been given the opportunity to ask questions and make suggestions.  [name] L 09/08/2013, 5:35 PM

## 2013-09-08 NOTE — BHH Counselor (Signed)
Writer informed the nurse working with the patient that she has been accepted to [facility] by [place], NP to a 500 Hall bed once there is a bed available.

## 2013-09-08 NOTE — Progress Notes (Signed)
Adult Psychoeducational Group Note  Date:  09/08/2013 Time:  8:59 PM  Group Topic/Focus:  Wrap-Up Group:   The focus of this group is to help patients review their daily goal of treatment and discuss progress on daily workbooks.  Participation Level:  Active  Participation Quality:  Appropriate  Affect:  Appropriate  Cognitive:  Appropriate  Insight: Appropriate  Engagement in Group:  Improving  Modes of Intervention:  Discussion  Additional Comments:  P stated that one good thing that happened to her today was that she was able to transfer over here from [facility] and and her goal for her treatment is to be able to her meds right and to get her life together, so that she can "function in the world"  [name] 09/08/2013, 8:59 PM

## 2013-09-08 NOTE — Progress Notes (Signed)
Pt was observed in her room.  She had just taken a shower.  Pt reports she is here for a med adjustment.  She feels her meds are causing her to have self-harm thoughts.  Pt denies SI/HI/AV to this Clinical research associate.  Pt was encouraged to make her needs known to staff.  Discussed her meds that are ordered at this time.  Pt voices no needs or concerns.  Support and encouragement offered.  Safety maintained with q15 minute checks.

## 2013-09-08 NOTE — Progress Notes (Signed)
Admission note: Per pt, "I was so depressed that I was trying to cut my wrist". "Didn't care what was happening, so ready to give up". "Everything was normal, not sure what triggered it". Started cutting her wrist about a month ago. Medications changed by Dr. Lolly Mustache and depression started getting worse two weeks ago. Changed lithium by adding 300 mg at [facility] and 2.5 Risperdal during the day and lowered the Wellbutrin. Was taking 600 mg of Lithium BID. Lithium level was low. Pt called crisis center d/t crying spell. Husband took her to [facility] and then they recommended that she come here for treatment. Pt denies SI/HI/AVH at this time.

## 2013-09-08 NOTE — ED Notes (Signed)
Report given to [facility], awaiting security for transport.

## 2013-09-08 NOTE — Consult Note (Signed)
[facility] Face-to-Face Psychiatry Consult   Reason for Consult:  Suicidal thinking Referring Physician:  Emergency room physician [name] is an 52 y.o. female.  Assessment: AXIS I:  Bipolar, Depressed AXIS II:  Deferred AXIS III:   Past Medical History  Diagnosis Date  . HTN (hypertension)   . Mania   . Bipolar disorder   . Anxiety   . Depression    AXIS IV:  other psychosocial or environmental problems, problems with access to health care services and problems with primary support group AXIS V:  1-10 persistent dangerousness to self and others present  Plan:  Recommend psychiatric Inpatient admission when medically cleared.  Subjective:   [name] is a 52 y.o. female patient admitted with suicidal thinking and having auditory hallucination to kill herself.  Patient has a history of bipolar disorder.   She was recently seen in the office and started on Risperdal to 0.25 mg twice a day, lithium was increased and Wellbutrin was decreased.  Patient endorsed increase hallucination, paranoia and having suicidal thoughts and plan.  She cut her wrist with mental nail to respond to hallucination.  She is sleeping 1-2 hours.  She was recommended assessment in the emergency room and found to be profoundly depressed and continues to have suicidal thoughts and plan. HPI:  See above HPI Elements:   Location:  Emergency room. Quality:  poor. Severity:  Unable to function. Timing:  One week.  Past Psychiatric History: Past Medical History  Diagnosis Date  . HTN (hypertension)   . Mania   . Bipolar disorder   . Anxiety   . Depression     reports that she has never smoked. She has never used smokeless tobacco. She reports that she does not drink alcohol or use illicit drugs. Family History  Problem Relation Age of Onset  . Alcohol abuse Father   . Drug abuse Brother   . Dementia Neg Hx   . ADD / ADHD Neg Hx   . Anxiety disorder Neg Hx   . Bipolar disorder Neg Hx   . Depression Neg  Hx   . OCD Neg Hx   . Paranoid behavior Neg Hx   . Schizophrenia Neg Hx   . Seizures Neg Hx   . Sexual abuse Neg Hx   . Physical abuse Neg Hx   . Suicidality Neg Hx   . Mental illness Mother    Family History Substance Abuse: Yes, Describe: (Pt reports her brother is a substance abuser) Family Supports: Yes, List: (Spouse) Living Arrangements: Spouse/significant other Can pt return to current living arrangement?: Yes Abuse/Neglect [facility]) Physical Abuse: Denies Sexual Abuse: Denies Allergies:   Allergies  Allergen Reactions  . Tegretol [Carbamazepine] Nausea And Vomiting  . Codeine Nausea Only  . Divalproex Sodium Swelling  . Erythromycin Nausea And Vomiting  . Phenytoin Sodium Extended Swelling    ACT Assessment Complete:  Yes:    Educational Status    Risk to Self: Risk to self Suicidal Ideation: Yes-Currently Present Suicidal Intent: Yes-Currently Present Is patient at risk for suicide?: Yes Suicidal Plan?: No Access to Means: Yes Specify Access to Suicidal Means: sharps eg., nails What has been your use of drugs/alcohol within the last 12 months?: none reported Previous Attempts/Gestures: No How many times?:  (pt reports having thoughts and plans prior,never acted on ) Other Self Harm Risks: cutting Triggers for Past Attempts: Unpredictable Intentional Self Injurious Behavior: Cutting Comment - Self Injurious Behavior: pt cut wrist with a metal nail  yesterday Family Suicide History: No Recent stressful life event(s): Other (Comment) (chronic depression) Persecutory voices/beliefs?: No Depression: Yes Depression Symptoms: Fatigue;Loss of interest in usual pleasures;Feeling worthless/self pity;Feeling angry/irritable Substance abuse history and/or treatment for substance abuse?: No Suicide prevention information given to non-admitted patients: Not applicable  Risk to Others: Risk to Others Homicidal Ideation: No Thoughts of Harm to Others: No Current Homicidal  Intent: No Current Homicidal Plan: No Access to Homicidal Means: No Identified Victim: na History of harm to others?: No Assessment of Violence: None Noted Violent Behavior Description: None Noted Does patient have access to weapons?: No Criminal Charges Pending?: No Does patient have a court date: No  Abuse: Abuse/Neglect Assessment (Assessment to be complete while patient is alone) Physical Abuse: Denies Sexual Abuse: Denies Exploitation of patient/patient's resources: Denies Self-Neglect: Denies  Prior Inpatient Therapy: Prior Inpatient Therapy Prior Inpatient Therapy: Yes Prior Therapy Dates: 4 or 5 prior admission at [facility]  Prior Therapy Facilty/Provider(s): [facility] Reason for Treatment: Depression,Bipolar, PTSD  Prior Outpatient Therapy: Prior Outpatient Therapy Prior Outpatient Therapy: Yes Prior Therapy Dates: Current Provider Prior Therapy Facilty/Provider(s): Dr. Lolly Mustache Reason for Treatment: Depression, Bipolar, Medication Management  Additional Information: Additional Information 1:1 In Past 12 Months?: No CIRT Risk: No Elopement Risk: No Does patient have medical clearance?: Yes                  Objective: Blood pressure 105/69, pulse 74, temperature 97.7 F (36.5 C), temperature source Oral, resp. rate 18, SpO2 96.00%.There is no weight on file to calculate BMI. Results for orders placed during the hospital encounter of 09/07/13 (from the past 72 hour(s))  URINE RAPID DRUG SCREEN (HOSP PERFORMED)     Status: None   Collection Time    09/07/13  2:14 PM      Result Value Range   Opiates NONE DETECTED  NONE DETECTED   Cocaine NONE DETECTED  NONE DETECTED   Benzodiazepines NONE DETECTED  NONE DETECTED   Amphetamines NONE DETECTED  NONE DETECTED   Tetrahydrocannabinol NONE DETECTED  NONE DETECTED   Barbiturates NONE DETECTED  NONE DETECTED   Comment:            DRUG SCREEN FOR MEDICAL PURPOSES     ONLY.  IF CONFIRMATION IS NEEDED     FOR ANY  PURPOSE, NOTIFY LAB     WITHIN 5 DAYS.                LOWEST DETECTABLE LIMITS     FOR URINE DRUG SCREEN     Drug Class       Cutoff (ng/mL)     Amphetamine      1000     Barbiturate      200     Benzodiazepine   200     Tricyclics       300     Opiates          300     Cocaine          300     THC              50  URINALYSIS, ROUTINE W REFLEX MICROSCOPIC     Status: None   Collection Time    09/07/13  2:14 PM      Result Value Range   Color, Urine YELLOW  YELLOW   APPearance CLEAR  CLEAR   Specific Gravity, Urine 1.015  1.005 - 1.030   pH 7.5  5.0 - 8.0  Glucose, UA NEGATIVE  NEGATIVE mg/dL   Hgb urine dipstick NEGATIVE  NEGATIVE   Bilirubin Urine NEGATIVE  NEGATIVE   Ketones, ur NEGATIVE  NEGATIVE mg/dL   Protein, ur NEGATIVE  NEGATIVE mg/dL   Urobilinogen, UA 0.2  0.0 - 1.0 mg/dL   Nitrite NEGATIVE  NEGATIVE   Leukocytes, UA NEGATIVE  NEGATIVE   Comment: MICROSCOPIC NOT DONE ON URINES WITH NEGATIVE PROTEIN, BLOOD, LEUKOCYTES, NITRITE, OR GLUCOSE <1000 mg/dL.  ACETAMINOPHEN LEVEL     Status: None   Collection Time    09/07/13  2:17 PM      Result Value Range   Acetaminophen (Tylenol), Serum <15.0  10 - 30 ug/mL   Comment:            THERAPEUTIC CONCENTRATIONS VARY     SIGNIFICANTLY. A RANGE OF 10-30     ug/mL MAY BE AN EFFECTIVE     CONCENTRATION FOR MANY PATIENTS.     HOWEVER, SOME ARE BEST TREATED     AT CONCENTRATIONS OUTSIDE THIS     RANGE.     ACETAMINOPHEN CONCENTRATIONS     >150 ug/mL AT 4 HOURS AFTER     INGESTION AND >50 ug/mL AT 12     HOURS AFTER INGESTION ARE     OFTEN ASSOCIATED WITH TOXIC     REACTIONS.  CBC     Status: Abnormal   Collection Time    09/07/13  2:17 PM      Result Value Range   WBC 9.1  4.0 - 10.5 K/uL   RBC 4.82  3.87 - 5.11 MIL/uL   Hemoglobin 15.4 (*) 12.0 - 15.0 g/dL   HCT 16.1  09.6 - 04.5 %   MCV 93.8  78.0 - 100.0 fL   MCH 32.0  26.0 - 34.0 pg   MCHC 34.1  30.0 - 36.0 g/dL   RDW 40.9  81.1 - 91.4 %   Platelets 290   150 - 400 K/uL  COMPREHENSIVE METABOLIC PANEL     Status: Abnormal   Collection Time    09/07/13  2:17 PM      Result Value Range   Sodium 134 (*) 135 - 145 mEq/L   Potassium 4.1  3.5 - 5.1 mEq/L   Chloride 101  96 - 112 mEq/L   CO2 24  19 - 32 mEq/L   Glucose, Bld 93  70 - 99 mg/dL   BUN 11  6 - 23 mg/dL   Creatinine, Ser 7.82  0.50 - 1.10 mg/dL   Calcium 9.7  8.4 - 95.6 mg/dL   Total Protein 6.8  6.0 - 8.3 g/dL   Albumin 3.9  3.5 - 5.2 g/dL   AST 15  0 - 37 U/L   ALT 16  0 - 35 U/L   Alkaline Phosphatase 72  39 - 117 U/L   Total Bilirubin 0.8  0.3 - 1.2 mg/dL   GFR calc non Af Amer >90  >90 mL/min   GFR calc Af Amer >90  >90 mL/min   Comment: (NOTE)     The eGFR has been calculated using the CKD EPI equation.     This calculation has not been validated in all clinical situations.     eGFR's persistently <90 mL/min signify possible Chronic Kidney     Disease.  ETHANOL     Status: None   Collection Time    09/07/13  2:17 PM      Result Value Range   Alcohol, Ethyl (B) <  11  0 - 11 mg/dL   Comment:            LOWEST DETECTABLE LIMIT FOR     SERUM ALCOHOL IS 11 mg/dL     FOR MEDICAL PURPOSES ONLY  SALICYLATE LEVEL     Status: Abnormal   Collection Time    09/07/13  2:17 PM      Result Value Range   Salicylate Lvl <2.0 (*) 2.8 - 20.0 mg/dL   Labs are reviewed.  Current Facility-Administered Medications  Medication Dose Route Frequency Provider Last Rate Last Dose  . clonazePAM (KLONOPIN) tablet 1.5 mg  1.5 mg Oral QHS PRN Audrea Muscat, NP   1.5 mg at 09/07/13 2307  . ibuprofen (ADVIL,MOTRIN) tablet 600 mg  600 mg Oral Q8H PRN Pascal Lux Wingen, PA-C   600 mg at 09/07/13 1750  . LORazepam (ATIVAN) tablet 1 mg  1 mg Oral Q8H PRN Pascal Lux Wingen, PA-C      . multivitamin with minerals tablet 1 tablet  1 tablet Oral Daily Evanna Janann August, NP   1 tablet at 09/08/13 1007  . ondansetron (ZOFRAN) tablet 4 mg  4 mg Oral Q8H PRN Pascal Lux Wingen, PA-C      .  zolpidem (AMBIEN) tablet 5 mg  5 mg Oral QHS PRN Magnus Sinning, PA-C   5 mg at 09/07/13 2307   Current Outpatient Prescriptions  Medication Sig Dispense Refill  . buPROPion (WELLBUTRIN XL) 150 MG 24 hr tablet Take 1 tablet (150 mg total) by mouth every morning.  30 tablet  0  . clonazePAM (KLONOPIN) 1 MG tablet Take 1.5 mg by mouth at bedtime as needed for anxiety.      Marland Kitchen lithium 300 MG tablet Take 1 tablet (300 mg total) by mouth daily.  30 tablet  0  . lithium 600 MG capsule Take 1 capsule (600 mg total) by mouth 2 (two) times daily with a meal. For mood stabilization  60 capsule  0  . Multiple Vitamin (MULTIVITAMIN WITH MINERALS) TABS tablet Take 1 tablet by mouth daily.      . risperiDONE (RISPERDAL) 0.25 MG tablet Take 1 tab twice a day  60 tablet  0  . risperiDONE (RISPERDAL) 2 MG tablet Take 1 tablet (2 mg total) by mouth at bedtime. For mood control  30 tablet  0    Psychiatric Specialty Exam:     Blood pressure 105/69, pulse 74, temperature 97.7 F (36.5 C), temperature source Oral, resp. rate 18, SpO2 96.00%.There is no weight on file to calculate BMI.  General Appearance: Guarded  Eye Contact::  Fair  Speech:  Slow  Volume:  Decreased  Mood:  Depressed  Affect:  Constricted  Thought Process:  Loose  Orientation:  Full (Time, Place, and Person)  Thought Content:  Hallucinations: Auditory and Paranoid Ideation  Suicidal Thoughts:  Yes.  with intent/plan  Homicidal Thoughts:  No  Memory:  Negative  Judgement:  Impaired  Insight:  Shallow  Psychomotor Activity:  Decreased  Concentration:  Fair  Recall:  Fair  Akathisia:  No  Handed:  Right  AIMS (if indicated):     Assets:  Communication Skills Desire for Improvement Housing  Sleep:      Treatment Plan Summary: Patient requires inpatient psychiatric treatment.  Will do a lithium level, increase Wellbutrin 300 mg daily.  Delayla Hoffmaster T. 09/08/2013 10:25 AM

## 2013-09-08 NOTE — ED Provider Notes (Signed)
Medical screening examination/treatment/procedure(s) were performed by non-physician practitioner and as supervising physician I was immediately available for consultation/collaboration.   Junius Argyle, MD 09/08/13 1242

## 2013-09-09 DIAGNOSIS — F3130 Bipolar disorder, current episode depressed, mild or moderate severity, unspecified: Principal | ICD-10-CM

## 2013-09-09 LAB — VALPROIC ACID LEVEL: Valproic Acid Lvl: <10 ug/mL — ABNORMAL LOW (ref 50.0–100.0)

## 2013-09-09 NOTE — [facility] Suicide Risk Assessment (Signed)
[facility] INPATIENT:  Family/Significant Other Suicide Prevention Education  Suicide Prevention Education:  Education Completed; Coco Sharpnack - husband [phone]),  (name of family member/significant other) has been identified by the patient as the family member/significant other with whom the patient will be residing, and identified as the person(s) who will aid the patient in the event of a mental health crisis (suicidal ideations/suicide attempt).  With written consent from the patient, the family member/significant other has been provided the following suicide prevention education, prior to the and/or following the discharge of the patient.  The suicide prevention education provided includes the following:  Suicide risk factors  Suicide prevention and interventions  National Suicide Hotline telephone number  [facility] assessment telephone number  [facility] Emergency Assistance 911  [facility] and/or Residential Mobile Crisis Unit telephone number  Request made of family/significant other to:  Remove weapons (e.g., guns, rifles, knives), all items previously/currently identified as safety concern.    Remove drugs/medications (over-the-counter, prescriptions, illicit drugs), all items previously/currently identified as a safety concern.  The family member/significant other verbalizes understanding of the suicide prevention education information provided.  The family member/significant other agrees to remove the items of safety concern listed above.  Carmina Miller 09/09/2013, 10:41 AM

## 2013-09-09 NOTE — Progress Notes (Signed)
Patient ID: [name], female   DOB: [birth year], 52 y.o.   MRN: [id number] D: Pt. Reports depression at "6" of 10. Upon Architectural technologist pt. Focus was making sure she would be getting Klonopin tonight. Pt. Says "everything going good so far" Pt. Reports she has been participating in group. A: Writer introduced self to client and reviewed medication administration schedule. Writer encouraged group. Staff will monitor q3min for safety. R: Pt. Is safe on the unt and attended group.

## 2013-09-09 NOTE — [facility] Counselor (Signed)
Adult Psychosocial Assessment Update Interdisciplinary Team  [facility] admissions[facility]discharges:  Admissions Discharges  Date: 11/13/12 Date: 11/17/12  Date: Date:  Date: Date:  Date: Date:  Date: Date:   Changes since the last Psychosocial Assessment (including adherence to outpatient mental health and/or substance abuse treatment, situational issues contributing to decompensation and/or relapse). Pt denies any triggers or factors to increased depression and SI, resulting in this admission.  Pt states that she has had depressive symptoms and SI over the last month, with it increasingly getting worse over the last week.  Pt reports seeing Dr. Lolly Mustache regularly since last admission.               Discharge Plan 1. Will you be returning to the same living situation after discharge?   Yes: X Pt can return home in [place], [place].  No:      If no, what is your plan?           2. Would you like a referral for services when you are discharged? Yes:  X   If yes, for what services? Refer pt back to Dr. Lolly Mustache.  Pt states that she doesn't want a therapist at this time.    No:              Summary and Recommendations (to be completed by the evaluator) Patient is a 52 year old Caucasian Female with a diagnosis of Major Depressive Disorder.  Patient lives in [place], [place] with her husband.  Patient will benefit from crisis stabilization, medication evaluation, group therapy and psycho education in addition to case management for discharge planning.                         Signature:  Carmina Miller, 09/09/2013 9:06 AM

## 2013-09-09 NOTE — [facility] Suicide Risk Assessment (Signed)
Suicide Risk Assessment  Admission Assessment     Nursing information obtained from:  Patient Demographic factors:  Caucasian;Unemployed Current Mental Status:  Suicidal ideation indicated by patient;Suicidal ideation indicated by others Loss Factors:  Decline in physical health Historical Factors:  Prior suicide attempts Risk Reduction Factors:  Sense of responsibility to family;Religious beliefs about death;Positive coping skills or problem solving skills  CLINICAL FACTORS:   Severe Anxiety and/or Agitation Bipolar Disorder:   Depressive phase Depression:   Anhedonia Hopelessness Impulsivity Insomnia Recent sense of peace/wellbeing Severe Unstable or Poor Therapeutic Relationship Previous Psychiatric Diagnoses and Treatments Medical Diagnoses and Treatments/Surgeries  COGNITIVE FEATURES THAT CONTRIBUTE TO RISK:  Closed-mindedness Loss of executive function Polarized thinking Thought constriction (tunnel vision)    SUICIDE RISK:   Moderate:  Frequent suicidal ideation with limited intensity, and duration, some specificity in terms of plans, no associated intent, good self-control, limited dysphoria/symptomatology, some risk factors present, and identifiable protective factors, including available and accessible social support.  PLAN OF CARE: Admit for bipolar depression and suicidal gestures and ideations. She has recent SIB and not able to contract for safety.   I certify that inpatient services furnished can reasonably be expected to improve the patient's condition.   Mikail Goostree,JANARDHAHA R. 09/09/2013, 5:37 PM

## 2013-09-09 NOTE — Progress Notes (Signed)
Adult Psychoeducational Group Note  Date:  09/09/2013 Time:  6:12 PM  Group Topic/Focus:  Overcoming Stress:   The focus of this group is to define stress and help patients assess their triggers.  Participation Level:  Active  Participation Quality:  Appropriate and Sharing  Affect:  Appropriate  Cognitive:  Appropriate  Insight: Appropriate  Engagement in Group:  Engaged  Modes of Intervention:  Discussion, Education, Socialization and Support  Additional Comments:  Pt stated her main stressor is worrying about her family. Pt stated she wanted to learn how to shoot her husband's gun to relieve stress.  Caswell Corwin 09/09/2013, 6:12 PM
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
## 2013-09-09 NOTE — Progress Notes (Signed)
D:  Per pt self inventory pt reports sleeping fair, appetite good, energy level low, ability to pay attention improving, rates depression at 7 out of 10 and hopelessness at a 7 out of 10, denies SI/HI/AVH.     A:  Emotional support provided, Encouraged pt to continue with treatment plan and attend all group activities, q15 min checks maintained for safety.  R:  Pt is flat depressed, calm and cooperative, attends groups.

## 2013-09-09 NOTE — Progress Notes (Signed)
Recreation Therapy Notes  Date: 09.18.2014 Time: 2:45pm Location: [address] Dayroom  Group Topic: Software engineer Activities (AAA)  Behavioral Response: Engaged, Attentive, Appropriate  Affect: Euthymic  Clinical Observations/Feedback: Dog Team: [employer] Patient interacted appropriately with peer, dog team, LRT and MHT.   Marykay Lex Alys Dulak, LRT/CTRS  Jearl Klinefelter 09/09/2013 4:26 PM

## 2013-09-09 NOTE — [facility] Group Notes (Signed)
[facility] LCSW Group Therapy  09/09/2013  1:15 PM   Type of Therapy:  Group Therapy  Participation Level:  Active  Participation Quality:  Appropriate and Attentive  Affect:  Appropriate, Flat and Depressed  Cognitive:  Alert and Appropriate  Insight:  Developing/Improving and Engaged  Engagement in Therapy:  Developing/Improving and Engaged  Modes of Intervention:  Clarification, Confrontation, Discussion, Education, Exploration, Limit-setting, Orientation, Problem-solving, Rapport Building, Dance movement psychotherapist, Socialization and Support  Summary of Progress/Problems: The topic for group was balance in life.  Today's group focused on defining balance in one's own words, identifying things that can knock one off balance, and exploring healthy ways to maintain balance in life. Group members were asked to provide an example of a time when they felt off balance, describe how they handled that situation,and process healthier ways to regain balance in the future. Group members were asked to share the most important tool for maintaining balance that they learned while at [facility] and how they plan to apply this method after discharge.  Pt states that her depression throws her off balance as she feels out of control, isolates and cries uncontrollably.  Pt was pulled out of group for the remainder of group by a provider but actively participated and was engaged during the time she was there.     [name], LCSWA 09/09/2013 2:54 PM

## 2013-09-09 NOTE — [facility] Group Notes (Signed)
[facility] Group Notes:  (Nursing/MHT/Case Management/Adjunct)  Date:  09/09/2013  Time:  9:29 AM  Type of Therapy:  Wellness/Rules Group  Participation Level:  None  Participation Quality:  Appropriate  Affect:  Flat  Cognitive:  Alert and Appropriate  Insight:  Appropriate  Engagement in Group:  Engaged  Modes of Intervention:  Clarification, Discussion, Orientation and Support  Summary of Progress/Problems:  Pt sat quietly during group, was calm and listened to others who shared.  Alfonse Spruce 09/09/2013, 9:29 AM

## 2013-09-10 DIAGNOSIS — F0630 Mood disorder due to known physiological condition, unspecified: Secondary | ICD-10-CM

## 2013-09-10 NOTE — Progress Notes (Signed)
D: Patient denies SI/HI and auditory and visual hallucinations. The patient has a depressed mood and affect. The patient reports feeling "a little less depressed today" and rates her depression a 2 and her hopelessness a 1 (out of 10; 1 low/10 high). The patient reports that she is trying to place herself first in her life and that she is trying to make her life better by doing this.  A: Patient given emotional support from RN. Patient encouraged to come to staff with concerns and/or questions. Patient's medication routine continued. Patient's orders and plan of care reviewed.  R: Patient remains appropriate and cooperative. Will continue to monitor patient q15 minutes for safety.

## 2013-09-10 NOTE — Progress Notes (Signed)
Adult Psychoeducational Group Note  Date:  09/10/2013 Time:  1:24 PM  Group Topic/Focus:  Early Warning Signs:   The focus of this group is to help patients identify signs or symptoms they exhibit before slipping into an unhealthy state or crisis.  Participation Level:  Minimal  Participation Quality:  Attentive  Affect:  Appropriate  Cognitive:  Alert and Appropriate  Insight: Good  Engagement in Group:  Engaged  Modes of Intervention:  Discussion, Exploration, Socialization and Support  Additional Comments:  Pt came to group and shared that 2 of her early warning signs were feeling hopeless and crying too much.  [name] 09/10/2013, 1:24 PM

## 2013-09-10 NOTE — Progress Notes (Signed)
[facility] MD Progress Note  09/10/2013 12:38 PM [name]  MRN:  [id number]  Subjective:  Patient is seen and chart reviewed. Patient has been compliant with her medication management as prescribed and has no reported adverse effects. Patient reported she was overwhelmed anxious and depressed and also has self-injurious behavior, cutting on her wrist with a sharp object. Patient contracts for safety in the hospital setting only.  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 rate 16, height 5' 2.8" (1.595 m), weight 66.679 kg (147 lb).Body mass index is 26.21 kg/(m^2).  General Appearance: Guarded  Eye Contact::  Minimal  Speech:  Clear and Coherent and Slow  Volume:  Decreased  Mood:  Anxious, Depressed, Hopeless and Worthless  Affect:  Depressed and Flat  Thought Process:  Goal Directed and Intact  Orientation:  Full (Time, Place, and Person)  Thought Content:  Rumination  Suicidal Thoughts:  Yes.  with intent/plan  Homicidal Thoughts:  No  Memory:  Immediate;   Fair  Judgement:  Impaired  Insight:  Lacking  Psychomotor Activity:  Decreased and Restlessness  Concentration:  Fair  Recall:  Fair  Akathisia:  NA  Handed:  Right  AIMS (if indicated):     Assets:  Communication Skills Desire for Improvement Financial  Resources/Insurance Housing Physical Health Resilience Social Support Transportation  Sleep:  Number of Hours: 6   Current Medications: Current Facility-Administered Medications  Medication Dose Route Frequency Provider Last Rate Last Dose  . acetaminophen (TYLENOL) tablet 650 mg  650 mg Oral Q6H PRN Shuvon Rankin, NP      . alum & mag hydroxide-simeth (MAALOX/MYLANTA) 200-200-20 MG/5ML suspension 30 mL  30 mL Oral Q4H PRN Shuvon Rankin, NP      . buPROPion (WELLBUTRIN XL) 24 hr tablet 300 mg  300 mg Oral q morning - 10a Shuvon Rankin, NP   300 mg at 09/10/13 0735  . clonazePAM (KLONOPIN) tablet 1.5 mg  1.5 mg Oral QHS PRN Shuvon Rankin, NP   1.5 mg at 09/09/13 2148  . lithium carbonate capsule 300 mg  300 mg Oral QAC lunch Shuvon Rankin, NP   300 mg at 09/10/13 1134  . lithium carbonate capsule 600 mg  600 mg Oral BID WC Shuvon Rankin, NP   600 mg at 09/10/13 0735  . magnesium hydroxide (MILK OF MAGNESIA) suspension 30 mL  30 mL Oral Daily PRN Shuvon Rankin, NP      . multivitamin with minerals tablet 1 tablet  1 tablet Oral Daily Shuvon Rankin, NP   1 tablet at 09/10/13 0735  . risperiDONE (RISPERDAL) tablet 0.25 mg  0.25 mg Oral BID Shuvon Rankin, NP   0.25 mg at 09/10/13 0735  . risperiDONE (RISPERDAL) tablet 2 mg  2 mg Oral QHS Shuvon Rankin, NP   2 mg at 09/09/13 2148    Lab Results:  Results for orders placed during the hospital encounter of 09/08/13 (from  the past 48 hour(s))  VALPROIC ACID LEVEL     Status: Abnormal   Collection Time    09/09/13  6:15 AM      Result Value Range   Valproic Acid Lvl <10.0 (*) 50.0 - 100.0 ug/mL   Comment: Performed at [facility]    Physical Findings: AIMS: Facial and Oral Movements Muscles of Facial Expression: None, normal Lips and Perioral Area: None, normal Jaw: None, normal Tongue: None, normal,Extremity Movements Upper (arms, wrists, hands, fingers): None, normal Lower (legs, knees, ankles, toes): None, normal, Trunk  Movements Neck, shoulders, hips: None, normal, Overall Severity Severity of abnormal movements (highest score from questions above): None, normal Incapacitation due to abnormal movements: None, normal Patient's awareness of abnormal movements (rate only patient's report): No Awareness, Dental Status Current problems with teeth and/or dentures?: No Does patient usually wear dentures?: No  CIWA:  CIWA-Ar Total: 0 COWS:  COWS Total Score: 1  Treatment Plan Summary: Daily contact with patient to assess and evaluate symptoms and progress in treatment Medication management  Plan: Continue current medication management; risperidone, lithium, Klonopin and Wellbutrin.  Patient has been tolerating well without side effects Monitor for the adverse effect of the medication and therapeutic benefits Treatment Plan/Recommendations:  1. Admit for crisis management and stabilization. 2. Medication management to reduce current symptoms to base line and improve the patient's overall level of functioning. 3. Treat health problems as indicated. 4. Develop treatment plan to decrease risk of relapse upon discharge and to reduce the need for readmission. 5. Psycho-social education regarding relapse prevention and self care. 6. Health care follow up as needed for medical problems. 7. Restart home medications where appropriate.   Medical Decision Making Problem Points:  Established problem, worsening (2), Review of last therapy session (1) and Review of psycho-social stressors (1) Data Points:  Review or order clinical lab tests (1) Review or order medicine tests (1) Review of medication regiment & side effects (2) Review of new medications or change in dosage (2)  I certify that inpatient services furnished can reasonably be expected to improve the patient's condition.   [name],Sue R. 09/10/2013, 12:38 PM

## 2013-09-10 NOTE — [facility] Group Notes (Signed)
[facility] LCSW Aftercare Discharge Planning Group Note   09/10/2013 8:45 AM  Participation Quality:  Alert and Appropriate   Mood/Affect:  Appropriate, Flat and Depressed  Depression Rating:  0  Anxiety Rating:  0  Thoughts of Suicide:  Pt denies SI/HI  Will you contract for safety?   Yes  Current AVH:  Pt denies  Plan for Discharge/Comments:  Pt attended discharge planning group and actively participated in group.  CSW provided pt with today's workbook.  Pt reports feeling better today.  Pt states that she plans to put herself first when she leaves the hospital.  Pt will return home to husband in [place], [place].  Pt has follow up scheduled at [facility] for medication management.  No further needs voiced by pt at this time.    Transportation Means: Pt reports access to transportation - husband will pick pt up  Supports: No supports mentioned at this time  Reyes Ivan, LCSWA 09/10/2013 9:52 AM

## 2013-09-10 NOTE — Progress Notes (Signed)
[facility] Group Notes:  (Nursing/MHT/Case Management/Adjunct)  Date:  09/10/2013  Time:  2000 Type of Therapy:  Psychoeducational Skills  Participation Level:  Active  Participation Quality:  Appropriate  Affect:  Appropriate  Cognitive:  Appropriate  Insight:  Good  Engagement in Group:  Developing/Improving  Modes of Intervention:  Education  Summary of Progress/Problems: The patient verbalized in group that she had a good day. She stated that she went to groups today as scheduled. Her goal for tomorrow is to get more rest.   [name] S 09/10/2013, 8:41 PM

## 2013-09-10 NOTE — Tx Team (Signed)
Interdisciplinary Treatment Plan Update (Adult)  Date: 09/10/2013  Time Reviewed:  9:45 AM  Progress in Treatment: Attending groups: Yes Participating in groups:  Yes Taking medication as prescribed:  Yes Tolerating medication:  Yes Family/Significant othe contact made: Yes Patient understands diagnosis:  Yes Discussing patient identified problems/goals with staff:  Yes Medical problems stabilized or resolved:  Yes Denies suicidal/homicidal ideation: Yes Issues/concerns per patient self-inventory:  Yes Other:  New problem(s) identified: N/A  Discharge Plan or Barriers: Pt has follow up scheduled at [facility] for medication management.    Reason for Continuation of Hospitalization: Anxiety Depression Medication Stabilization  Comments: N/A  Estimated length of stay: 3-5 days  For review of initial/current patient goals, please see plan of care.  Attendees: Patient:     Family:     Physician:  Dr. Javier Glazier 09/10/2013 9:59 AM   Nursing:   Nestor Ramp, RN 09/10/2013 9:59 AM   Clinical Social Worker:  Reyes Ivan, LCSWA 09/10/2013 9:59 AM   Other: Verne Spurr, PA 09/10/2013 9:59 AM   Other:  Frankey Shown, MA care coordination 09/10/2013 9:59 AM   Other:  Juline Patch, LCSW 09/10/2013 9:59 AM   Other:  Burnetta Sabin, RN 09/10/2013 10:00 AM   Other:    Other:    Other:    Other:    Other:    Other:     Scribe for Treatment Team:   Carmina Miller, 09/10/2013 9:59 AM

## 2013-09-10 NOTE — Care Management Utilization Note (Signed)
Per State Regulation 482.30  The chart was reviewed for necessity with respect to the patient's Admission/ Duration of stay. Admission 09/08/2013  Next Review Date: D/C 09/10/2013  Lacinda Axon, RN, BSN

## 2013-09-10 NOTE — [facility] Group Notes (Signed)
[facility] LCSW Group Therapy  09/10/2013  1:15 PM   Type of Therapy:  Group Therapy  Participation Level:  Active  Participation Quality:  Appropriate and Attentive  Affect:  Appropriate, Flat and Depressed  Cognitive:  Alert and Appropriate  Insight:  Developing/Improving and Engaged  Engagement in Therapy:  Developing/Improving and Engaged  Modes of Intervention:  Clarification, Confrontation, Discussion, Education, Exploration, Limit-setting, Orientation, Problem-solving, Rapport Building, Dance movement psychotherapist, Socialization and Support  Summary of Progress/Problems: The topic for today was feelings about relapse.  Pt discussed what relapse prevention is to them and identified triggers that they are on the path to relapse.  Pt processed their feeling towards relapse and was able to relate to peers.  Pt discussed coping skills that can be used for relapse prevention.   Pt shared that she feels worthless and undeserving to be around others, as well as a burden on others so instead bottles her emotions and feelings inside, until she explodes, which results in depression and SI.  Pt states that she plans to talk and open up more to her husband, whom she reports is supportive and willing to be there for her. Pt actively participated and was engaged in group discussion.    Reyes Ivan, [place] 09/10/2013 2:34 PM

## 2013-09-11 MED ORDER — MAGNESIUM HYDROXIDE 400 MG/5ML PO SUSP: 30.0000 mL | Freq: Every day | ORAL | Status: DC | PRN

## 2013-09-11 MED ORDER — ACETAMINOPHEN 325 MG PO TABS: 650.0000 mg | ORAL_TABLET | Freq: Four times a day (QID) | ORAL | Status: DC | PRN

## 2013-09-11 MED ORDER — ALUM & MAG HYDROXIDE-SIMETH 200-200-20 MG/5ML PO SUSP: 30.0000 mL | ORAL | Status: DC | PRN

## 2013-09-11 NOTE — [facility] Group Notes (Signed)
[facility] Group Notes:  (Clinical Social Work)  08/14/2013   3:00-4:00PM  Summary of Progress/Problems:   The main focus of today's process group was for the patient to identify ways in which they have sabotaged their own mental health wellness/recovery.  There was a discussion initially about the concept of recovery, and what that means to different patients.  Motivational interviewing was used to explore the reasons they engage in behavior that is detrimental to recovery, and reasons for wanting to change.  Patients were asked to rate their motivation to change their self-sabotaging behaviors on a scale of 0 (no motivation) to 10 (willing to do whatever is recommended).   The patient expressed that she self sabotages by holding things in until she explodes.  Her motivation is "9" and she feels she is not totally invested due to fear "of not making it."  Type of Therapy:  Process Group  Participation Level:  Active  Participation Quality:  Attentive  Affect:  Blunted and Depressed  Cognitive:  Oriented  Insight:  Engaged  Engagement in Therapy:  Engaged  Modes of Intervention:  Education, Motivational Interviewing   Surveyor, minerals, LCSW 4:45 PM

## 2013-09-11 NOTE — Progress Notes (Signed)
The focus of this group is to help patients review their daily goal of treatment and discuss progress on daily workbooks. Pt attended the evening group and responded to discussion prompts from the Writer. Pt shared that she had a good day on the unit due in part to interactions she had with her peers on the hallway. "Today has been fun." Pt reported having no additional needs from Nursing Staff. Pt's affect was neutral, though the Writer observed the Pt smiling appropriately several times during group.

## 2013-09-11 NOTE — Progress Notes (Signed)
Adult Psychoeducational Group Note  Date:  09/11/2013 Time:  0900  Group Topic/Focus:  Identifying Needs:   The focus of this group is to help patients identify their personal needs that have been historically problematic and identify healthy behaviors to address their needs.  Participation Level:  None  Participation Quality:  Appropriate and Attentive  Affect:  Blunted  Cognitive:  Alert  Insight: Improving  Engagement in Group:  None  Modes of Intervention:  Discussion, Education and Exploration  Additional Comments:  Pt is attentive and alert but did not share  Kenner Lewan Shari Prows 09/11/2013, 11:51 AM

## 2013-09-11 NOTE — Progress Notes (Signed)
Pt laying in bed resting with eyes closed. Respirations even and unlabored. No distress noted.  

## 2013-09-11 NOTE — Progress Notes (Signed)
Patient ID: [name], female   DOB: 09-Oct-1961, 52 y.o.   MRN: [id number] [facility] MD Progress Note  09/11/2013 3:41 PM VENISA FRAMPTON  MRN:  [id number]  Subjective:  Patient has no complaints today. Patient reported she been feeling better since her medication were adjusted and tolerated well. Patient treated her symptoms as low. Patient has been compliant with her medication management as prescribed and has no reported adverse effects. Patient reported she she may be able to go home soon and endorses safety contract. Patient contracts for safety in the hospital setting only.  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 rate 16, height 5' 2.8" (1.595 m), weight 66.679 kg (147 lb).Body mass index is 26.21 kg/(m^2).  General Appearance: Guarded  Eye Contact::  Minimal  Speech:  Clear and Coherent and Slow  Volume:  Decreased  Mood:  Anxious, Depressed, Hopeless and Worthless  Affect:  Depressed and Flat  Thought Process:  Goal Directed and Intact  Orientation:  Full (Time, Place, and Person)  Thought Content:  Rumination  Suicidal Thoughts:  Yes.  with intent/plan  Homicidal Thoughts:  No  Memory:  Immediate;   Fair  Judgement:  Impaired  Insight:  Lacking  Psychomotor Activity:  Decreased and Restlessness  Concentration:  Fair   Recall:  Fair  Akathisia:  NA  Handed:  Right  AIMS (if indicated):     Assets:  Communication Skills Desire for Improvement Financial Resources/Insurance Housing Physical Health Resilience Social Support Transportation  Sleep:  Number of Hours: 6.75   Current Medications: Current Facility-Administered Medications  Medication Dose Route Frequency Provider Last Rate Last Dose  . acetaminophen (TYLENOL) tablet 650 mg  650 mg Oral Q6H PRN Shuvon Rankin, NP      . alum & mag hydroxide-simeth (MAALOX/MYLANTA) 200-200-20 MG/5ML suspension 30 mL  30 mL Oral Q4H PRN Shuvon Rankin, NP      . buPROPion (WELLBUTRIN XL) 24 hr tablet 300 mg  300 mg Oral q morning - 10a Shuvon Rankin, NP   300 mg at 09/11/13 0813  . clonazePAM (KLONOPIN) tablet 1.5 mg  1.5 mg Oral QHS PRN Shuvon Rankin, NP   1.5 mg at 09/10/13 2102  . lithium carbonate capsule 300 mg  300 mg Oral QAC lunch Shuvon Rankin, NP   300 mg at 09/11/13 1209  . lithium carbonate capsule 600 mg  600 mg Oral BID WC Shuvon Rankin, NP   600 mg at 09/11/13 0800  . magnesium hydroxide (MILK OF MAGNESIA) suspension 30 mL  30 mL Oral Daily PRN Shuvon Rankin, NP      . multivitamin with minerals tablet 1 tablet  1 tablet Oral Daily Shuvon Rankin, NP   1 tablet at 09/11/13 0813  . risperiDONE (RISPERDAL) tablet 0.25 mg  0.25 mg Oral BID Shuvon Rankin, NP   0.25 mg at 09/11/13 0814  . risperiDONE (RISPERDAL) tablet 2 mg  2 mg  Oral QHS Shuvon Rankin, NP   2 mg at 09/10/13 2102    Lab Results:  No results found for this or any previous visit (from the past 48 hour(s)).  Physical Findings: AIMS: Facial and Oral Movements Muscles of Facial Expression: None, normal Lips and Perioral Area: None, normal Jaw: None, normal Tongue: None, normal,Extremity Movements Upper (arms, wrists, hands, fingers): None, normal Lower (legs, knees, ankles, toes): None, normal, Trunk Movements Neck, shoulders, hips: None, normal, Overall Severity Severity of  abnormal movements (highest score from questions above): None, normal Incapacitation due to abnormal movements: None, normal Patient's awareness of abnormal movements (rate only patient's report): No Awareness, Dental Status Current problems with teeth and/or dentures?: No Does patient usually wear dentures?: No  CIWA:  CIWA-Ar Total: 0 COWS:  COWS Total Score: 1  Treatment Plan Summary: Daily contact with patient to assess and evaluate symptoms and progress in treatment Medication management  Plan: Continue current medication management; risperidone, lithium, Klonopin and Wellbutrin.  Patient has been tolerating well without side effects Monitor for the adverse effect of the medication and therapeutic benefits Treatment Plan/Recommendations:  1. Admit for crisis management and stabilization. 2. Medication management to reduce current symptoms to base line and improve the patient's overall level of functioning. 3. Treat health problems as indicated. 4. Develop treatment plan to decrease risk of relapse upon discharge and to reduce the need for readmission. 5. Psycho-social education regarding relapse prevention and self care. 6. Health care follow up as needed for medical problems. 7. May discharge home tomorrow if she continues to contract for safety.    Established problem, worsening (2), Review of last therapy session (1) and Review of psycho-social stressors (1) Data Points:  Review or order clinical lab tests (1) Review or order medicine tests (1) Review of medication regiment & side effects (2) Review of new medications or change in dosage (2)  I certify that inpatient services furnished can reasonably be expected to improve the patient's condition.   Nylene Inlow,JANARDHAHA R. 09/11/2013, 3:41 PM

## 2013-09-11 NOTE — Progress Notes (Signed)
D: Pt denies SI/HI/AV. Pt is pleasant and cooperative. Pt rates depression at a 1, anxiety at a 0, and Helplessness/hopelessness at a 1.  A: Pt was offered support and encouragement. Pt was given scheduled medications. Pt was encourage to attend groups. Q 15 minute checks were done for safety.  R:Pt attends  groups and interacts well with peers and staff. Pt taking medication. Pt has no complaints.Pt receptive to treatment and safety maintained on unit.

## 2013-09-11 NOTE — Progress Notes (Signed)
Patient has been up and active on the unit, she attended group this evening and participated. Patient reports that she has had a good day and feels that her medications are working for her. Patient voiced no complaints, requested her hs meds which she received. Patient denies si/hi/a/v hallucinations. Support and encouragement offered, safety maintained on unit. Will continue to monitor.

## 2013-09-12 NOTE — Progress Notes (Signed)
Adult Psychoeducational Group Note  Date:  09/12/2013 Time:  0900  Group Topic/Focus:  Spirituality:   The focus of this group is to discuss how one's spirituality can aide in recovery.  Participation Level:  Active  Participation Quality:  Appropriate and Attentive  Affect:  Appropriate and Flat  Cognitive:  Alert and Appropriate  Insight: Improving  Engagement in Group:  Engaged  Modes of Intervention:  Discussion, Education and Exploration  Additional Comments:  Pt is quiet but attentive.  [name] Shari Prows 09/12/2013, 11:45 AM

## 2013-09-12 NOTE — Progress Notes (Signed)
Patient has been up and active on the unit with plans to attend  group this evening and has voiced no complaints. Patient is hopeful to discharge on tomorrow. Patient currently denies having pain, -si/hi/a/v hall. Support and encouragement offered, safety maintained on unit, will continue to monitor.

## 2013-09-12 NOTE — [facility] Group Notes (Signed)
[facility] Group Notes:  (Clinical Social Work)  09/12/2013   3:00-4:00PM  Summary of Progress/Problems:   The main focus of today's process group was to   identify the patient's current support system and decide on other supports that can be put in place.  The picture on workbook was used to discuss why additional supports are needed, and a hand-out was distributed with four definitions/levels of support, then used to talk about how patients have given and received all different kinds of support.  An emphasis was placed on using counselor, doctor, therapy groups, 12-step groups, and problem-specific support groups to expand supports.  The patient expressed a willingness to add supports, shared with the group what she has done that has worked.  She talked of going to one support group and not returning and is willing to try again with a different group.  Type of Therapy:  Process Group  Participation Level:  Active  Participation Quality:  Attentive and Sharing  Affect:  Blunted  Cognitive:  Oriented  Insight:  Engaged  Engagement in Therapy:  Engaged  Modes of Intervention:  Education,  Support and [employer], LCSW 09/12/2013, 2:55 PM

## 2013-09-12 NOTE — Progress Notes (Signed)
Adult Psychoeducational Group Note  Date:  09/12/2013 Time:  8:00pm Group Topic/Focus:  Wrap-Up Group:   The focus of this group is to help patients review their daily goal of treatment and discuss progress on daily workbooks.  Participation Level:  Active  Participation Quality:  Appropriate and Attentive  Affect:  Appropriate  Cognitive:  Alert and Appropriate  Insight: Appropriate  Engagement in Group:  Engaged  Modes of Intervention:  Discussion and Education  Additional Comments:  Pt attended and participated in group. When ask how was her day pt stated good and when ask what made it good pt stated socializing with peers and seeing her husband. Pt was asked what was her goal for tomorrow pt stated she is going home.  Shelly Bombard D 09/12/2013, 8:41 PM

## 2013-09-12 NOTE — Progress Notes (Signed)
Patient ID: [name], female   DOB: 11-10-1961, 52 y.o.   MRN: [id number] [facility] MD Progress Note  09/12/2013 12:21 PM JEHAN RANGANATHAN  MRN:  [id number]  Subjective:  [name] is smiling brightly and states she is feeling good, when asked how she knows she is better she reports that she is able to laugh for the first time in a long while, and that she just feels happier, "like I could light up the world!" Her affect is consistent with her statement. She notes no new symptoms and feels that she is ready to be discharged out. She is tolerating the medication well and has no side effects.  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 rate 16, height 5' 2.8" (1.595 m), weight 66.679 kg (147 lb).Body mass index is 26.21 kg/(m^2).  General Appearance: Guarded  Eye Contact::  Minimal  Speech:  Clear and Coherent and Slow  Volume:  Decreased  Mood:  Anxious, Depressed, Hopeless and Worthless  Affect:  Depressed and Flat  Thought Process:  Goal Directed and Intact  Orientation:  Full (Time, Place, and Person)  Thought Content:  Rumination  Suicidal Thoughts:  Yes.  with intent/plan  Homicidal Thoughts:  No  Memory:  Immediate;   Fair  Judgement:  Impaired  Insight:  Lacking  Psychomotor Activity:  Decreased and Restlessness  Concentration:  Fair   Recall:  Fair  Akathisia:  NA  Handed:  Right  AIMS (if indicated):     Assets:  Communication Skills Desire for Improvement Financial Resources/Insurance Housing Physical Health Resilience Social Support Transportation  Sleep:  Number of Hours: 6.5   Current Medications: Current Facility-Administered Medications  Medication Dose Route Frequency Provider Last Rate Last Dose  . acetaminophen (TYLENOL) tablet 650 mg  650 mg Oral Q6H PRN Shuvon Rankin, NP      . acetaminophen (TYLENOL) tablet 650 mg  650 mg Oral Q6H PRN Evanna Janann August, NP      . alum & mag hydroxide-simeth (MAALOX/MYLANTA) 200-200-20 MG/5ML suspension 30 mL  30 mL Oral Q4H PRN Shuvon Rankin, NP      . alum & mag hydroxide-simeth (MAALOX/MYLANTA) 200-200-20 MG/5ML suspension 30 mL  30 mL Oral Q4H PRN Evanna Janann August, NP      . buPROPion (WELLBUTRIN XL) 24 hr tablet 300 mg  300 mg Oral q morning - 10a Shuvon Rankin, NP   300 mg at 09/12/13 0802  . clonazePAM (KLONOPIN) tablet 1.5 mg  1.5 mg Oral QHS PRN Shuvon Rankin, NP   1.5 mg at 09/11/13 2050  . lithium carbonate capsule 300 mg  300 mg Oral QAC lunch Shuvon Rankin, NP   300 mg at 09/12/13 1153  . lithium carbonate capsule 600 mg  600 mg Oral BID WC Shuvon Rankin, NP   600 mg at 09/12/13 0802  . magnesium hydroxide (MILK OF MAGNESIA) suspension 30 mL  30 mL Oral Daily PRN  Shuvon Rankin, NP      . magnesium hydroxide (MILK OF MAGNESIA) suspension 30 mL  30 mL Oral Daily PRN Evanna Janann August, NP      . multivitamin with minerals tablet 1 tablet  1 tablet Oral Daily Shuvon Rankin, NP   1 tablet at 09/12/13 0802  . risperiDONE (RISPERDAL) tablet 0.25 mg  0.25 mg Oral BID Shuvon Rankin, NP   0.25 mg at 09/12/13 0802  . risperiDONE (RISPERDAL) tablet 2 mg  2 mg Oral QHS Shuvon Rankin, NP   2 mg at 09/11/13 2050    Lab Results:  No results found for this or any previous visit (from the past 48 hour(s)).  Physical Findings: AIMS: Facial and Oral  Movements Muscles of Facial Expression: None, normal Lips and Perioral Area: None, normal Jaw: None, normal Tongue: None, normal,Extremity Movements Upper (arms, wrists, hands, fingers): None, normal Lower (legs, knees, ankles, toes): None, normal, Trunk Movements Neck, shoulders, hips: None, normal, Overall Severity Severity of abnormal movements (highest score from questions above): None, normal Incapacitation due to abnormal movements: None, normal Patient's awareness of abnormal movements (rate only patient's report): No Awareness, Dental Status Current problems with teeth and/or dentures?: No Does patient usually wear dentures?: No  CIWA:  CIWA-Ar Total: 0 COWS:  COWS Total Score: 1  Treatment Plan Summary: Daily contact with patient to assess and evaluate symptoms and progress in treatment Medication management  Plan: 1. Continue crisis management and stabilization. 2. Medication management to reduce current symptoms to base line and improve patient's overall level of functioning 3. Treat health problems as indicated. 4. Develop treatment plan to decrease risk of relapse upon discharge and the need for readmission. 5. Psycho-social education regarding relapse prevention and self care. 6. Health care follow up as needed for medical problems. 7. Continue home medications where appropriate. 8. Will order Lithium level for in the morning. 9. Patient will d/c home after blood work in AM.   Established problem, worsening (2), Review of last therapy session (1) and Review of psycho-social stressors (1) Data Points:  Review or order clinical lab tests (1) Review or order medicine tests (1) Review of medication regiment & side effects (2) Review of new medications or change in dosage (2)  I certify that inpatient services furnished can reasonably be expected to improve the patient's condition.   Rona Ravens. [place] RPAC 12:27 PM 09/12/2013  Reviewed the information documented and  agree with the treatment plan.  Cathline Dowen,JANARDHAHA R. 09/12/2013 3:52 PM

## 2013-09-12 NOTE — Progress Notes (Signed)
D: Pt denies SI/HI/AV. Pt is pleasant and cooperative. Pt rates depression at a 1 and Helplessness/hopelessness at a 1.  A: Pt was offered support and encouragement. Pt was given scheduled medications. Pt was encourage to attend groups. Q 15 minute checks were done for safety.  R:Pt attends groups and interacts well with peers and staff. Pt taking medication. Pt has no complaints but feels that she is ready to go home.Pt receptive to treatment and safety maintained on unit.

## 2013-09-13 LAB — LITHIUM LEVEL: Lithium Lvl: 0.64 mEq/L — ABNORMAL LOW (ref 0.80–1.40)

## 2013-09-13 MED ORDER — LITHIUM CARBONATE 600 MG PO CAPS: 600.0000 mg | ORAL_CAPSULE | Freq: Two times a day (BID) | ORAL | Status: DC

## 2013-09-13 MED ORDER — RISPERIDONE 2 MG PO TABS: 2.0000 mg | ORAL_TABLET | Freq: Every day | ORAL | Status: DC

## 2013-09-13 MED ORDER — LITHIUM CARBONATE 300 MG PO CAPS: 300.0000 mg | ORAL_CAPSULE | Freq: Every day | ORAL | Status: DC

## 2013-09-13 MED ORDER — RISPERIDONE 0.25 MG PO TABS: 0.2500 mg | ORAL_TABLET | Freq: Two times a day (BID) | ORAL | Status: DC

## 2013-09-13 MED ORDER — BUPROPION HCL ER (XL) 300 MG PO TB24: 300.0000 mg | ORAL_TABLET | ORAL | Status: DC

## 2013-09-13 MED ORDER — LITHIUM CARBONATE 300 MG PO CAPS: ORAL_CAPSULE | ORAL | Status: DC

## 2013-09-13 MED ORDER — CLONAZEPAM 1 MG PO TABS: 1.5000 mg | ORAL_TABLET | Freq: Every evening | ORAL | Status: DC | PRN

## 2013-09-13 NOTE — Progress Notes (Signed)
Discharge Note:  Patient discharged home with family member.  Denied SI and HI.  Denied A/V hallucinations.  Denied pain.  Patient was given suicide prevention information which was explained to her and she stated she understood and had no questions.  Patient received all her belongings, prescriptions, medications, toiletries, clothing, miscellaneous items.  Stated she appreciated all assistance received from [facility].

## 2013-09-13 NOTE — Tx Team (Signed)
Interdisciplinary Treatment Plan Update   Date Reviewed:  09/13/2013  Time Reviewed:  9:45 AM  Progress in Treatment:   Attending groups: Yes Participating in groups: Yes Taking medication as prescribed: Yes  Tolerating medication: Yes Family/Significant other contact made: Yes  Patient understands diagnosis: Yes  Discussing patient identified problems/goals with staff: Yes Medical problems stabilized or resolved: Yes Denies suicidal/homicidal ideation: Yes  In tx team Patient has not harmed self or others: Yes  For review of initial/current patient goals, please see plan of care.  Estimated Length of Stay:  Discharge today  Reasons for Continued Hospitalization:  New Problems/Goals identified:    Discharge Plan or Barriers:   Home with outpatient follow up  Additional Comments:  Attendees:  Patient:  09/13/2013 9:45 AM   Signature: 09/13/2013 9:45 AM  Signature:  Verne Spurr, PA 09/13/2013 9:45 AM  Signature:  09/13/2013 9:45 AM  Signature:Beverly Terrilee Croak, RN 09/13/2013 9:45 AM  Signature:  Neill Loft RN 09/13/2013 9:45 AM  Signature:  Juline Patch, LCSW 09/13/2013 9:45 AM  Signature:   09/13/2013 9:45 AM  Signature:  Maseta Dorley,Care Coordinator 09/13/2013 9:45 AM  Signature:  09/13/2013 9:45 AM  Signature: 09/13/2013  9:45 AM  Signature:    Signature:      Scribe for Treatment Team:   Juline Patch,  09/13/2013 9:45 AM

## 2013-09-13 NOTE — Progress Notes (Signed)
[facility] Adult Case Management Discharge Plan :  Will you be returning to the same living situation after discharge: Yes,  home At discharge, do you have transportation home?:Yes,  family Do you have the ability to pay for your medications:Yes,  insurance  Release of information consent forms completed and in the chart;  Patient's signature needed at discharge.  Patient to Follow up at: Follow-up Information   Follow up with [facility] On 09/15/2013. (Appointment scheduled at 2:15 pm with Dr. Lolly Mustache for medication management)    Contact information:   [address] [place], [place] [address] Phone: [phone]      Patient denies SI/HI:   Yes,  yes    Safety Planning and Suicide Prevention discussed:  Yes,  yes  Ida Rogue 09/13/2013, 6:01 PM

## 2013-09-13 NOTE — Discharge Summary (Signed)
Physician Discharge Summary Note  Patient:  [name] is an 52 y.o., female MRN:  [id number] DOB:  1961/05/07 Patient phone:  [phone] (home)  Patient address:   [address] [place] [address],   Date of Admission:  09/08/2013 Date of Discharge:  09/13/2013  Reason for Admission:  Depression with SI  Discharge Diagnoses: Active Problems:   * No active hospital problems. *  ROS  DSM5: Schizophrenia Disorders:  Obsessive-Compulsive Disorders:  Trauma-Stressor Disorders:  Substance/Addictive Disorders:  Depressive Disorders: Major Depressive Disorder - Severe (296.23)  AXIS I: Bipolar, Depressed  AXIS II: Deferred  AXIS III:  Past Medical History   Diagnosis  Date   .  HTN (hypertension)    .  Mania    .  Bipolar disorder    .  Anxiety    .  Depression     AXIS IV: occupational problems, problems related to social environment and problems with primary support group  AXIS V: 31-40 impairment in reality testing   Level of Care:  OP  Hospital Course:         [name] is a 52 y.o. female who presents to the Emergency Department complaining of gradually worsening depression and SI that started about 2 weeks ago. She cut her wrists with a screw yesterday. Pt states she was doing it at first to see if it would hurt then she started doing it to actually hurt herself. Pt is here voluntarily but her psychiatrist called here to have her admitted to [facility]. She has been on Wellbutrin and lithium but states they are not helping. She denies any physical symptoms currently. Pt denies any drug or alcohol use.         [name] was oriented to the unit and encouraged to participate in unit programming. Medical problems were identified and treated appropriately. Home medication was restarted as needed. Psychiatric medication management was initiated.         The patient was evaluated each day by a clinical provider to ascertain the patient's response to treatment.  Improvement  was noted by the patient's report of decreasing symptoms, improved sleep and appetite, affect, medication tolerance, behavior, and participation in unit programming.  [name] was asked each day to complete a self inventory noting mood, mental status, pain, new symptoms, anxiety and concerns.         She responded well to medication and being in a therapeutic and supportive environment. Positive and appropriate behavior was noted and the patient was motivated for recovery.  Makensie worked closely with the treatment team and case manager to develop a discharge plan with appropriate goals. Coping skills, problem solving as well as relaxation therapies were also part of the unit programming.         By the day of discharge [name] was in much improved condition than upon admission.  Symptoms were reported as significantly decreased or resolved completely.  The patient denied SI/HI and voiced no AVH. She was motivated to continue taking medication with a goal of continued improvement in mental health.          [name] was discharged home with a plan to follow up as noted below.  Consults:  None  Significant Diagnostic Studies:  None  Discharge Vitals:   Blood pressure 118/75, pulse 94, temperature 98.7 F (37.1 C), temperature source Oral, resp. rate 18, height 5' 2.8" (1.595 m), weight 66.679 kg (147 lb). Body mass index is 26.21 kg/(m^2). Lab  Results:   Results for orders placed during the hospital encounter of 09/08/13 (from the past 72 hour(s))  LITHIUM LEVEL     Status: Abnormal   Collection Time    09/13/13  6:10 AM      Result Value Range   Lithium Lvl 0.64 (*) 0.80 - 1.40 mEq/L   Comment: Performed at [facility]    Physical Findings: AIMS: Facial and Oral Movements Muscles of Facial Expression: None, normal Lips and Perioral Area: None, normal Jaw: None, normal Tongue: None, normal,Extremity Movements Upper (arms, wrists, hands, fingers): None, normal Lower (legs,  knees, ankles, toes): None, normal, Trunk Movements Neck, shoulders, hips: None, normal, Overall Severity Severity of abnormal movements (highest score from questions above): None, normal Incapacitation due to abnormal movements: None, normal Patient's awareness of abnormal movements (rate only patient's report): No Awareness, Dental Status Current problems with teeth and/or dentures?: No Does patient usually wear dentures?: No  CIWA:  CIWA-Ar Total: 0 COWS:  COWS Total Score: 1  Psychiatric Specialty Exam: See Psychiatric Specialty Exam and Suicide Risk Assessment completed by Attending Physician prior to discharge.  Discharge destination:  Home  Is patient on multiple antipsychotic therapies at discharge:  No   Has Patient had three or more failed trials of antipsychotic monotherapy by history:  No  Recommended Plan for Multiple Antipsychotic Therapies: NA  Discharge Orders   Future Orders Complete By Expires   Diet - low sodium heart healthy  As directed    Discharge instructions  As directed    Comments:     Take all of your medications as directed. Be sure to keep all of your follow up appointments.  If you are unable to keep your follow up appointment, call your Doctor's office to let them know, and reschedule.  Make sure that you have enough medication to last until your appointment. Be sure to get plenty of rest. Going to bed at the same time each night will help. Try to avoid sleeping during the day.  Increase your activity as tolerated. Regular exercise will help you to sleep better and improve your mental health. Eating a heart healthy diet is recommended. Try to avoid salty or fried foods. Be sure to avoid all alcohol and illegal drugs.   Increase activity slowly  As directed        Medication List    STOP taking these medications       lithium 300 MG tablet  Replaced by:  lithium carbonate 300 MG capsule     multivitamin with minerals Tabs tablet      TAKE  these medications     Indication   buPROPion 300 MG 24 hr tablet  Commonly known as:  WELLBUTRIN XL  Take 1 tablet (300 mg total) by mouth every morning. For depression,.   Indication:  Major Depressive Disorder     clonazePAM 1 MG tablet  Commonly known as:  KLONOPIN  Take 1.5 tablets (1.5 mg total) by mouth at bedtime as needed for anxiety.   Indication:  Panic Disorder     lithium 600 MG capsule  Take 1 capsule (600 mg total) by mouth 2 (two) times daily with a meal. For mood stabilization   Indication:  Depression, Mood Disorder     lithium carbonate 300 MG capsule  Take 1 capsule (300 mg total) by mouth daily before lunch. For mood stabilization.   Indication:  Manic-Depression     risperiDONE 2 MG tablet  Commonly known as:  RISPERDAL  Take 1 tablet (2 mg total) by mouth at bedtime. For mood control   Indication:  Manic-Depression     risperiDONE 0.25 MG tablet  Commonly known as:  RISPERDAL  Take 1 tablet (0.25 mg total) by mouth 2 (two) times daily. Take 1 tab twice a day for anxiety.   Indication:  anxiety           Follow-up Information   Follow up with [facility] On 09/15/2013. (Appointment scheduled at 2:15 pm with Dr. Lolly Mustache for medication management)    Contact information:   [address] [place], [place] [address] Phone: [phone]      Follow-up recommendations:   Activities: Resume activity as tolerated. Diet: Heart healthy low sodium diet Tests: Follow up testing will be determined by your out patient provider. Comments:  Continue to work on the lifestyle changes that could better help you manage your mood and anxiety disorders  Total Discharge Time:  Greater than 30 minutes.  Signed: MASHBURN,NEIL Agree with assessment and plan Madie Reno A.Dub Mikes, M.D. 09/13/2013, 10:26 AM

## 2013-09-13 NOTE — [facility] Suicide Risk Assessment (Signed)
Suicide Risk Assessment  Discharge Assessment     Demographic Factors:  Caucasian  Mental Status Per Nursing Assessment::   On Admission:  Suicidal ideation indicated by patient;Suicidal ideation indicated by others  Current Mental Status by Physician: In full contact with reality. States she feels much better. Denies suicidal ideas, plans or intent. Her mood is euthymic, her affect is appropriate. States that there medications are working for her now. She states she has a lot of support at home.    Loss Factors: NA  Historical Factors: NA  Risk Reduction Factors:   Sense of responsibility to family, Living with another person, especially a relative, Positive social support and Positive therapeutic relationship  Continued Clinical Symptoms:  Depression:   Severe  Cognitive Features That Contribute To Risk:  Polarized thinking Thought constriction (tunnel vision)    Suicide Risk:  Minimal: No identifiable suicidal ideation.  Patients presenting with no risk factors but with morbid ruminations; may be classified as minimal risk based on the severity of the depressive symptoms  Discharge Diagnoses:   AXIS I:  Bipolar, Depressed AXIS II:  Deferred AXIS III:   Past Medical History  Diagnosis Date  . HTN (hypertension)   . Mania   . Bipolar disorder   . Anxiety   . Depression    AXIS IV:  other psychosocial or environmental problems AXIS V:  61-70 mild symptoms  Plan Of Care/Follow-up recommendations:  Activity:  as tolerated Diet:  regular Follow up Dr. Lolly Mustache at [facility] Is patient on multiple antipsychotic therapies at discharge:  No   Has Patient had three or more failed trials of antipsychotic monotherapy by history:  No  Recommended Plan for Multiple Antipsychotic Therapies: NA  [name] A 09/13/2013, 12:29 PM

## 2013-09-13 NOTE — Progress Notes (Signed)
Adult Psychoeducational Group Note  Date:  09/13/2013 Time:  11:39 AM  Group Topic/Focus:  Dimensions of Wellness:   The focus of this group is to introduce the topic of wellness and discuss the role each dimension of wellness plays in total health.  Participation Level:  Active  Participation Quality:  Attentive  Affect:  Appropriate  Cognitive:  Appropriate  Insight: Good  Engagement in Group:  Developing/Improving  Modes of Intervention:  Discussion  Additional Comments:  Voice was very flat   Rockwell Germany, Sharel Behne 09/13/2013, 11:39 AM

## 2013-09-13 NOTE — Progress Notes (Signed)
D:  Patient's self inventory sheet, patient sleeps well, good appetite, normal energy level, good attention span.  Rated depression and hopelessness #1.  Denied anxiety.  Denied withdrawals.  Denied SI.  Worst pain #1.  Plans to keep up on meds after discharge.  Plans to return home after discharge.  No problems with meds after discharge. A:  Medications administered per MD orders.  Emotional support and encouragement given patient. R:  Patient denied SI and HI.  Denied A/V hallucinations.  Denied pain.  Will continue to monitor for safety with 15 minute checks.  Safety maintained.

## 2013-09-15 ENCOUNTER — Ambulatory Visit (INDEPENDENT_AMBULATORY_CARE_PROVIDER_SITE_OTHER): Payer: PRIVATE HEALTH INSURANCE | Admitting: Psychiatry

## 2013-09-15 ENCOUNTER — Encounter (HOSPITAL_COMMUNITY): Payer: Self-pay | Admitting: *Deleted

## 2013-09-15 ENCOUNTER — Encounter (HOSPITAL_COMMUNITY): Payer: Self-pay | Admitting: Psychiatry

## 2013-09-15 VITALS — BP 113/77 | HR 68 | Ht 63.0 in | Wt 153.2 lb

## 2013-09-15 DIAGNOSIS — F3132 Bipolar disorder, current episode depressed, moderate: Secondary | ICD-10-CM

## 2013-09-15 DIAGNOSIS — F319 Bipolar disorder, unspecified: Secondary | ICD-10-CM

## 2013-09-15 DIAGNOSIS — F29 Unspecified psychosis not due to a substance or known physiological condition: Secondary | ICD-10-CM

## 2013-09-15 NOTE — Progress Notes (Signed)
[facility] [id number] Progress Note [name] MRN: [id number] DOB: 09-14-1961 Age: 52 y.o.  Date: 09/15/2013  Chief Complaint  Patient presents with  . Follow-up  . Stress    History of presenting illness. Patient is 52 year old Caucasian female who recently discharged from [facility] and came for her followup appointment.  Patient was admitted because she was having auditory hallucination, paranoia , suicidal thought and she was playing with a gun and cut herself.  In the emergency room her Wellbutrin was increased to 300.  During hospitalization her medicine remain the same.  She is doing much better and she feels increase Wellbutrin helped her a lot.  She is taking lithium 600 mg in the morning and at suppertime and 300 mg in the day.  She denies any side effects other than mild tremors.  She denies any hallucination paranoia or any suicidal thoughts.  She has not involved in any suicidal behavior since release from the hospital.  She is sleeping better.  Her energy level is much improved.  She has no longer any gun .  Her husband took the gun away and disassembled .  Patient denies any anger or any severe mood swing.  She is not drinking or using illegal substance.  Her recent lithium level is 0.61 .    Past psychiatric history. Patient has significant history of bipolar disorder with multiple psychiatric inpatient treatment.  Her last admission was this month for suicidal thinking and hallucination.  She was playing with the guns and superficially cut her wrist with a needle. Patient has no history of suicidal attempt but admitted history of passive suicidal thinking and severe manic episode.  In the past she had tried Zyprexa Seroquel however she is very resistant and reluctant to take any medication that can cause weight gain.  She was tried tried Tegretol however she developed increased paranoia confusion and she was required hospitalization at behavioral Health  Center.    Allergies: Allergies  Allergen Reactions  . Tegretol [Carbamazepine] Nausea And Vomiting  . Codeine Nausea Only  . Divalproex Sodium Swelling  . Erythromycin Nausea And Vomiting  . Phenytoin Sodium Extended Swelling   Medical History: Past Medical History  Diagnosis Date  . HTN (hypertension)   . Mania   . Bipolar disorder   . Anxiety   . Depression    she is to take antihypertensive medication but no longer says her perpetrator has been fairly stable.  Surgical History: Past Surgical History  Procedure Laterality Date  . Brain surgery    . Abdominal hysterectomy    . Knee surgery      right x 2  . Incontinence surgery     Family History: family history includes Alcohol abuse in her father; Drug abuse in her brother; Mental illness in her mother. There is no history of Dementia, ADD / ADHD, Anxiety disorder, Bipolar disorder, Depression, OCD, Paranoid behavior, Schizophrenia, Seizures, Sexual abuse, Physical abuse, or Suicidality. Reviewed again tod during the visit.  Review of Systems  Constitutional: Negative.   HENT: Negative.   Respiratory: Negative.   Cardiovascular: Negative.   Musculoskeletal: Negative.   Neurological: Negative.  Negative for focal weakness and seizures.  Psychiatric/Behavioral: Negative for depression, suicidal ideas and substance abuse.       Irritability.    Vital Signs:BP 113/77  Pulse 68  Ht 5\' 3"  (1.6 m)  Wt 153 lb 3.2 oz (69.491 kg)  BMI 27.14 kg/m2   Lab Results:  Results for  orders placed during the hospital encounter of 09/08/13 (from the past 8736 hour(s))  VALPROIC ACID LEVEL   Collection Time    09/09/13  6:15 AM      Result Value Range   Valproic Acid Lvl <10.0 (*) 50.0 - 100.0 ug/mL  LITHIUM LEVEL   Collection Time    09/13/13  6:10 AM      Result Value Range   Lithium Lvl 0.64 (*) 0.80 - 1.40 mEq/L  Results for orders placed during the hospital encounter of 09/07/13 (from the past 8736 hour(s))  URINE  RAPID DRUG SCREEN (HOSP PERFORMED)   Collection Time    09/07/13  2:14 PM      Result Value Range   Opiates NONE DETECTED  NONE DETECTED   Cocaine NONE DETECTED  NONE DETECTED   Benzodiazepines NONE DETECTED  NONE DETECTED   Amphetamines NONE DETECTED  NONE DETECTED   Tetrahydrocannabinol NONE DETECTED  NONE DETECTED   Barbiturates NONE DETECTED  NONE DETECTED  URINALYSIS, ROUTINE W REFLEX MICROSCOPIC   Collection Time    09/07/13  2:14 PM      Result Value Range   Color, Urine YELLOW  YELLOW   APPearance CLEAR  CLEAR   Specific Gravity, Urine 1.015  1.005 - 1.030   pH 7.5  5.0 - 8.0   Glucose, UA NEGATIVE  NEGATIVE mg/dL   Hgb urine dipstick NEGATIVE  NEGATIVE   Bilirubin Urine NEGATIVE  NEGATIVE   Ketones, ur NEGATIVE  NEGATIVE mg/dL   Protein, ur NEGATIVE  NEGATIVE mg/dL   Urobilinogen, UA 0.2  0.0 - 1.0 mg/dL   Nitrite NEGATIVE  NEGATIVE   Leukocytes, UA NEGATIVE  NEGATIVE  ACETAMINOPHEN LEVEL   Collection Time    09/07/13  2:17 PM      Result Value Range   Acetaminophen (Tylenol), Serum <15.0  10 - 30 ug/mL  CBC   Collection Time    09/07/13  2:17 PM      Result Value Range   WBC 9.1  4.0 - 10.5 K/uL   RBC 4.82  3.87 - 5.11 MIL/uL   Hemoglobin 15.4 (*) 12.0 - 15.0 g/dL   HCT 16.1  09.6 - 04.5 %   MCV 93.8  78.0 - 100.0 fL   MCH 32.0  26.0 - 34.0 pg   MCHC 34.1  30.0 - 36.0 g/dL   RDW 40.9  81.1 - 91.4 %   Platelets 290  150 - 400 K/uL  COMPREHENSIVE METABOLIC PANEL   Collection Time    09/07/13  2:17 PM      Result Value Range   Sodium 134 (*) 135 - 145 mEq/L   Potassium 4.1  3.5 - 5.1 mEq/L   Chloride 101  96 - 112 mEq/L   CO2 24  19 - 32 mEq/L   Glucose, Bld 93  70 - 99 mg/dL   BUN 11  6 - 23 mg/dL   Creatinine, Ser 7.82  0.50 - 1.10 mg/dL   Calcium 9.7  8.4 - 95.6 mg/dL   Total Protein 6.8  6.0 - 8.3 g/dL   Albumin 3.9  3.5 - 5.2 g/dL   AST 15  0 - 37 U/L   ALT 16  0 - 35 U/L   Alkaline Phosphatase 72  39 - 117 U/L   Total Bilirubin 0.8  0.3 - 1.2  mg/dL   GFR calc non Af Amer >90  >90 mL/min   GFR calc Af Amer >90  >90 mL/min  ETHANOL   Collection Time    09/07/13  2:17 PM      Result Value Range   Alcohol, Ethyl (B) <11  0 - 11 mg/dL  SALICYLATE LEVEL   Collection Time    09/07/13  2:17 PM      Result Value Range   Salicylate Lvl <2.0 (*) 2.8 - 20.0 mg/dL  LITHIUM LEVEL   Collection Time    09/08/13 11:30 AM      Result Value Range   Lithium Lvl 0.41 (*) 0.80 - 1.40 mEq/L  Results for orders placed in visit on 05/13/13 (from the past 8736 hour(s))  LIPID PANEL   Collection Time    05/13/13  9:38 AM      Result Value Range   Cholesterol 174  0 - 200 mg/dL   Triglycerides 48  <657 mg/dL   HDL 69  >84 mg/dL   Total CHOL/HDL Ratio 2.5     VLDL 10  0 - 40 mg/dL   LDL Cholesterol 95  0 - 99 mg/dL  Results for orders placed during the hospital encounter of 11/13/12 (from the past 8736 hour(s))  HEPATIC FUNCTION PANEL   Collection Time    11/13/12  5:02 PM      Result Value Range   Total Protein 6.7  6.0 - 8.3 g/dL   Albumin 3.9  3.5 - 5.2 g/dL   AST 15  0 - 37 U/L   ALT 15  0 - 35 U/L   Alkaline Phosphatase 74  39 - 117 U/L   Total Bilirubin 0.6  0.3 - 1.2 mg/dL   Bilirubin, Direct 0.1  0.0 - 0.3 mg/dL   Indirect Bilirubin 0.5  0.3 - 0.9 mg/dL  URINALYSIS, ROUTINE W REFLEX MICROSCOPIC   Collection Time    11/13/12  6:51 PM      Result Value Range   Color, Urine YELLOW  YELLOW   APPearance CLOUDY (*) CLEAR   Specific Gravity, Urine 1.017  1.005 - 1.030   pH 6.5  5.0 - 8.0   Glucose, UA NEGATIVE  NEGATIVE mg/dL   Hgb urine dipstick NEGATIVE  NEGATIVE   Bilirubin Urine NEGATIVE  NEGATIVE   Ketones, ur NEGATIVE  NEGATIVE mg/dL   Protein, ur NEGATIVE  NEGATIVE mg/dL   Urobilinogen, UA 0.2  0.0 - 1.0 mg/dL   Nitrite NEGATIVE  NEGATIVE   Leukocytes, UA NEGATIVE  NEGATIVE  PREGNANCY, URINE   Collection Time    11/13/12  6:51 PM      Result Value Range   Preg Test, Ur NEGATIVE  NEGATIVE  URINE RAPID DRUG SCREEN  (HOSP PERFORMED)   Collection Time    11/13/12  6:51 PM      Result Value Range   Opiates NONE DETECTED  NONE DETECTED   Cocaine NONE DETECTED  NONE DETECTED   Benzodiazepines NONE DETECTED  NONE DETECTED   Amphetamines NONE DETECTED  NONE DETECTED   Tetrahydrocannabinol NONE DETECTED  NONE DETECTED   Barbiturates NONE DETECTED  NONE DETECTED  CBC   Collection Time    11/13/12  7:45 PM      Result Value Range   WBC 8.6  4.0 - 10.5 K/uL   RBC 4.53  3.87 - 5.11 MIL/uL   Hemoglobin 14.0  12.0 - 15.0 g/dL   HCT 69.6  29.5 - 28.4 %   MCV 93.8  78.0 - 100.0 fL   MCH 30.9  26.0 - 34.0 pg   MCHC 32.9  30.0 -  36.0 g/dL   RDW 86.5  78.4 - 69.6 %   Platelets 258  150 - 400 K/uL  COMPREHENSIVE METABOLIC PANEL   Collection Time    11/13/12  7:45 PM      Result Value Range   Sodium 139  135 - 145 mEq/L   Potassium 3.8  3.5 - 5.1 mEq/L   Chloride 102  96 - 112 mEq/L   CO2 25  19 - 32 mEq/L   Glucose, Bld 94  70 - 99 mg/dL   BUN 7  6 - 23 mg/dL   Creatinine, Ser 2.95  0.50 - 1.10 mg/dL   Calcium 28.4  8.4 - 13.2 mg/dL   Total Protein 6.8  6.0 - 8.3 g/dL   Albumin 3.9  3.5 - 5.2 g/dL   AST 18  0 - 37 U/L   ALT 16  0 - 35 U/L   Alkaline Phosphatase 73  39 - 117 U/L   Total Bilirubin 0.6  0.3 - 1.2 mg/dL   GFR calc non Af Amer 69 (*) >90 mL/min   GFR calc Af Amer 80 (*) >90 mL/min  HEMOGLOBIN A1C   Collection Time    11/13/12  7:45 PM      Result Value Range   Hemoglobin A1C 5.0  <5.7 %   Mean Plasma Glucose 97  <117 mg/dL  TSH   Collection Time    11/13/12  7:45 PM      Result Value Range   TSH 1.786  0.350 - 4.500 uIU/mL  ETHANOL   Collection Time    11/13/12  7:45 PM      Result Value Range   Alcohol, Ethyl (B) <11  0 - 11 mg/dL  LITHIUM LEVEL   Collection Time    11/13/12  7:45 PM      Result Value Range   Lithium Lvl 0.75 (*) 0.80 - 1.40 mEq/L  TSH   Collection Time    11/17/12  6:20 AM      Result Value Range   TSH 1.268  0.350 - 4.500 uIU/mL  LITHIUM LEVEL    Collection Time    11/17/12  6:20 AM      Result Value Range   Lithium Lvl 0.60 (*) 0.80 - 1.40 mEq/L  BASIC METABOLIC PANEL   Collection Time    11/17/12  6:20 AM      Result Value Range   Sodium 137  135 - 145 mEq/L   Potassium 3.7  3.5 - 5.1 mEq/L   Chloride 102  96 - 112 mEq/L   CO2 26  19 - 32 mEq/L   Glucose, Bld 97  70 - 99 mg/dL   BUN 11  6 - 23 mg/dL   Creatinine, Ser 4.40  0.50 - 1.10 mg/dL   Calcium 9.8  8.4 - 10.2 mg/dL   GFR calc non Af Amer >90  >90 mL/min   GFR calc Af Amer >90  >90 mL/min   Mental status examination Patient is casually dressed and well groomed.  She is pleasant and cooperative.  She denies any active or passive suicidal thoughts homicidal thoughts.  She denies any auditory or visual hallucination.  Her speech is clear and coherent.  She describes her mood as anxious and her affect is mood appropriate.  She has fine tremors but denies any stiffness .  There were no paranoia or delusions present at this time.  Her attention concentration is improved from the past.  She's alert and  oriented x3.  Her insight judgment and impulse control is okay.  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  Patient is doing much better on her current medication.  I will continue lithium 600 mg twice a day and 300 mg daily, Risperdal 0.25 mg twice a day, Risperdal 2 mg at bedtime,  Wellbutrin XL 300 mg daily and Klonopin 1.5 mg at bedtime.  Recommend to call us back despite if she has any question or concern.  .  I will see her again in 4 weeks.  Patient has prescription given up on discharge .  Time spent 25 minutes.  More than 50% of the time spent in psychoeducation, counseling and coordination of care.  Discuss safety plan that anytime having active suicidal thoughts or homicidal thoughts  then patient need to call 911 or go to the local emergency room.   Thoams Siefert T., MD

## 2013-09-17 NOTE — Progress Notes (Signed)
Patient Discharge Instructions:  Next Level Care Provider Has Access to the EMR, 09/17/13 Records provided to [facility] via [facility]/Epic access.  [name], 09/17/2013, 11:53 AM

## 2013-10-14 ENCOUNTER — Encounter (HOSPITAL_COMMUNITY): Payer: Self-pay | Admitting: Psychiatry

## 2013-10-14 ENCOUNTER — Ambulatory Visit (INDEPENDENT_AMBULATORY_CARE_PROVIDER_SITE_OTHER): Payer: PRIVATE HEALTH INSURANCE | Admitting: Psychiatry

## 2013-10-14 VITALS — BP 117/81 | HR 83 | Wt 159.2 lb

## 2013-10-14 DIAGNOSIS — F29 Unspecified psychosis not due to a substance or known physiological condition: Secondary | ICD-10-CM

## 2013-10-14 DIAGNOSIS — F319 Bipolar disorder, unspecified: Secondary | ICD-10-CM

## 2013-10-14 MED ORDER — BUPROPION HCL ER (XL) 300 MG PO TB24: 300.0000 mg | ORAL_TABLET | ORAL | Status: DC

## 2013-10-14 MED ORDER — RISPERIDONE 0.25 MG PO TABS: 0.2500 mg | ORAL_TABLET | Freq: Two times a day (BID) | ORAL | Status: DC

## 2013-10-14 MED ORDER — LITHIUM CARBONATE 300 MG PO CAPS: ORAL_CAPSULE | ORAL | Status: DC

## 2013-10-14 MED ORDER — TOPIRAMATE 25 MG PO TABS: 25.0000 mg | ORAL_TABLET | Freq: Every day | ORAL | Status: DC

## 2013-10-14 MED ORDER — CLONAZEPAM 1 MG PO TABS: 1.5000 mg | ORAL_TABLET | Freq: Every evening | ORAL | Status: DC | PRN

## 2013-10-14 MED ORDER — RISPERIDONE 2 MG PO TABS: 2.0000 mg | ORAL_TABLET | Freq: Every day | ORAL | Status: DC
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
## 2013-10-28 ENCOUNTER — Other Ambulatory Visit: Payer: Self-pay

## 2013-12-01 ENCOUNTER — Other Ambulatory Visit (HOSPITAL_COMMUNITY): Payer: Self-pay | Admitting: Psychiatry

## 2013-12-01 ENCOUNTER — Other Ambulatory Visit (HOSPITAL_COMMUNITY): Payer: Self-pay | Admitting: Physician Assistant

## 2013-12-01 DIAGNOSIS — F319 Bipolar disorder, unspecified: Secondary | ICD-10-CM

## 2013-12-14 ENCOUNTER — Other Ambulatory Visit (HOSPITAL_COMMUNITY): Payer: Self-pay | Admitting: *Deleted

## 2013-12-14 ENCOUNTER — Ambulatory Visit (INDEPENDENT_AMBULATORY_CARE_PROVIDER_SITE_OTHER): Payer: PRIVATE HEALTH INSURANCE | Admitting: Psychiatry

## 2013-12-14 ENCOUNTER — Encounter (HOSPITAL_COMMUNITY): Payer: Self-pay | Admitting: Psychiatry

## 2013-12-14 VITALS — Wt 160.0 lb

## 2013-12-14 DIAGNOSIS — F319 Bipolar disorder, unspecified: Secondary | ICD-10-CM

## 2013-12-14 DIAGNOSIS — F29 Unspecified psychosis not due to a substance or known physiological condition: Secondary | ICD-10-CM

## 2013-12-14 MED ORDER — RISPERIDONE 0.25 MG PO TABS: 0.2500 mg | ORAL_TABLET | Freq: Two times a day (BID) | ORAL | Status: DC

## 2013-12-14 MED ORDER — TOPIRAMATE 50 MG PO TABS: 50.0000 mg | ORAL_TABLET | Freq: Every day | ORAL | Status: DC

## 2013-12-14 MED ORDER — TOPIRAMATE 25 MG PO TABS: 25.0000 mg | ORAL_TABLET | Freq: Every day | ORAL | Status: DC

## 2013-12-14 MED ORDER — BUPROPION HCL ER (XL) 300 MG PO TB24
300.0000 mg | ORAL_TABLET | ORAL | Status: DC
Start: 2013-12-14 — End: 2014-02-09

## 2013-12-14 MED ORDER — LITHIUM CARBONATE 300 MG PO CAPS: ORAL_CAPSULE | ORAL | Status: DC

## 2013-12-14 MED ORDER — RISPERIDONE 2 MG PO TABS: 2.0000 mg | ORAL_TABLET | Freq: Every day | ORAL | Status: DC

## 2013-12-14 MED ORDER — CLONAZEPAM 1 MG PO TABS: ORAL_TABLET | ORAL | Status: DC

## 2013-12-14 NOTE — Addendum Note (Signed)
Addended by: Kathryne Sharper T on: 12/14/2013 02:07 PM   Modules accepted: Orders
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
## 2013-12-14 NOTE — Telephone Encounter (Signed)
Klonopin Rx printed in error. Called to pharmacy at this time

## 2014-02-09 ENCOUNTER — Other Ambulatory Visit (HOSPITAL_COMMUNITY): Payer: Self-pay | Admitting: Psychiatry

## 2014-02-09 DIAGNOSIS — F319 Bipolar disorder, unspecified: Secondary | ICD-10-CM

## 2014-02-11 ENCOUNTER — Telehealth (HOSPITAL_COMMUNITY): Payer: Self-pay

## 2014-02-11 ENCOUNTER — Other Ambulatory Visit (HOSPITAL_COMMUNITY): Payer: Self-pay | Admitting: Psychiatry

## 2014-02-15 ENCOUNTER — Ambulatory Visit (HOSPITAL_COMMUNITY): Payer: Self-pay | Admitting: Psychiatry

## 2014-02-21 ENCOUNTER — Ambulatory Visit (INDEPENDENT_AMBULATORY_CARE_PROVIDER_SITE_OTHER): Payer: PRIVATE HEALTH INSURANCE | Admitting: Psychiatry

## 2014-02-21 ENCOUNTER — Encounter (HOSPITAL_COMMUNITY): Payer: Self-pay | Admitting: Psychiatry

## 2014-02-21 VITALS — BP 115/82 | HR 82 | Ht 63.0 in | Wt 159.4 lb

## 2014-02-21 DIAGNOSIS — F29 Unspecified psychosis not due to a substance or known physiological condition: Secondary | ICD-10-CM

## 2014-02-21 DIAGNOSIS — F319 Bipolar disorder, unspecified: Secondary | ICD-10-CM

## 2014-02-21 MED ORDER — TOPIRAMATE 50 MG PO TABS: ORAL_TABLET | ORAL | Status: DC

## 2014-02-21 MED ORDER — CLONAZEPAM 1 MG PO TABS: ORAL_TABLET | ORAL | Status: DC

## 2014-02-21 MED ORDER — RISPERIDONE 2 MG PO TABS: ORAL_TABLET | ORAL | Status: DC

## 2014-02-21 MED ORDER — LITHIUM CARBONATE 300 MG PO CAPS: ORAL_CAPSULE | ORAL | Status: DC

## 2014-02-21 MED ORDER — BUPROPION HCL ER (XL) 300 MG PO TB24: ORAL_TABLET | ORAL | Status: DC

## 2014-02-21 NOTE — Progress Notes (Signed)
[facility] [id number] Progress Note ALAYZA PIEPER MRN: [id number] DOB: 01-17-1961 Age: 54 y.o.  Date: 02/21/2014  Chief Complaint  Patient presents with  . Follow-up  . Medication Refill    History of presenting illness. [name] came for her followup appointment.  She is doing better although she continued to endorse concern about her weight which has not lost since she is trying very hard.  She is using [employer] .  She denies any agitation or anger but admitted episodes of crying and anxiety when she does not see any weight loss.  She is taking Celexa 40 mg.  She is compliant with her other psychotropic medication.  She does take  Klonopin 1 mg at bedtime and half as needed.  She denies any tremors or shakes.  She is going to gym and watching her calorie intake but not sure why she is not able to lose weight.  She told that weight watcher program charging too much because she is not able to lose weight.  She is requesting a letter that she is taking psychotropic medication and she cannot lose weight and she need to take her medication as prescribed.  Patient wants to continue weight watcher.  She denies any hallucinations or any paranoia.  She is not drinking or using any illegal substances.  She denies any paranoia or any hallucination.  She does not endorse any suicidal thoughts or any homicidal thoughts.  Past psychiatric history. Patient has history of bipolar disorder with multiple psychiatric inpatient treatment.  Her last admission was September 2014 for suicidal thinking and hallucination.  She was playing with the guns and superficially cut her wrist with a needle. Patient has no history of suicidal attempt but admitted history of passive suicidal thinking and severe manic episode.  In the past she had tried Zyprexa Seroquel however she is very resistant and reluctant to take any medication that can cause weight gain.  She was tried tried Tegretol however she developed increased  paranoia confusion and she was required hospitalization at behavioral [place].    Allergies: Allergies  Allergen Reactions  . Tegretol [Carbamazepine] Nausea And Vomiting  . Codeine Nausea Only  . Divalproex Sodium Swelling  . Erythromycin Nausea And Vomiting  . Phenytoin Sodium Extended Swelling   Medical History: Past Medical History  Diagnosis Date  . HTN (hypertension)   . Mania   . Bipolar disorder   . Anxiety   . Depression    she is to take antihypertensive medication but no longer says her perpetrator has been fairly stable.  Surgical History: Past Surgical History  Procedure Laterality Date  . Brain surgery    . Abdominal hysterectomy    . Knee surgery      right x 2  . Incontinence surgery     Family History: family history includes Alcohol abuse in her father; Drug abuse in her brother; Mental illness in her mother. There is no history of Dementia, ADD / ADHD, Anxiety disorder, Bipolar disorder, Depression, OCD, Paranoid behavior, Schizophrenia, Seizures, Sexual abuse, Physical abuse, or Suicidality. Reviewed again tod during the visit.  Review of Systems  Musculoskeletal: Negative.     Vital Signs:BP 115/82  Pulse 82  Ht 5\' 3"  (1.6 m)  Wt 159 lb 6.4 oz (72.303 kg)  BMI 28.24 kg/m2   Lab Results:  Results for orders placed during the hospital encounter of 09/08/13 (from the past 8736 hour(s))  VALPROIC ACID LEVEL   Collection Time  09/09/13  6:15 AM      Result Value Ref Range   Valproic Acid Lvl <10.0 (*) 50.0 - 100.0 ug/mL  LITHIUM LEVEL   Collection Time    09/13/13  6:10 AM      Result Value Ref Range   Lithium Lvl 0.64 (*) 0.80 - 1.40 mEq/L  Results for orders placed during the hospital encounter of 09/07/13 (from the past 8736 hour(s))  URINE RAPID DRUG SCREEN (HOSP PERFORMED)   Collection Time    09/07/13  2:14 PM      Result Value Ref Range   Opiates NONE DETECTED  NONE DETECTED   Cocaine NONE DETECTED  NONE DETECTED    Benzodiazepines NONE DETECTED  NONE DETECTED   Amphetamines NONE DETECTED  NONE DETECTED   Tetrahydrocannabinol NONE DETECTED  NONE DETECTED   Barbiturates NONE DETECTED  NONE DETECTED  URINALYSIS, ROUTINE W REFLEX MICROSCOPIC   Collection Time    09/07/13  2:14 PM      Result Value Ref Range   Color, Urine YELLOW  YELLOW   APPearance CLEAR  CLEAR   Specific Gravity, Urine 1.015  1.005 - 1.030   pH 7.5  5.0 - 8.0   Glucose, UA NEGATIVE  NEGATIVE mg/dL   Hgb urine dipstick NEGATIVE  NEGATIVE   Bilirubin Urine NEGATIVE  NEGATIVE   Ketones, ur NEGATIVE  NEGATIVE mg/dL   Protein, ur NEGATIVE  NEGATIVE mg/dL   Urobilinogen, UA 0.2  0.0 - 1.0 mg/dL   Nitrite NEGATIVE  NEGATIVE   Leukocytes, UA NEGATIVE  NEGATIVE  ACETAMINOPHEN LEVEL   Collection Time    09/07/13  2:17 PM      Result Value Ref Range   Acetaminophen (Tylenol), Serum <15.0  10 - 30 ug/mL  CBC   Collection Time    09/07/13  2:17 PM      Result Value Ref Range   WBC 9.1  4.0 - 10.5 K/uL   RBC 4.82  3.87 - 5.11 MIL/uL   Hemoglobin 15.4 (*) 12.0 - 15.0 g/dL   HCT 45.2  36.0 - 46.0 %   MCV 93.8  78.0 - 100.0 fL   MCH 32.0  26.0 - 34.0 pg   MCHC 34.1  30.0 - 36.0 g/dL   RDW 12.2  11.5 - 15.5 %   Platelets 290  150 - 400 K/uL  COMPREHENSIVE METABOLIC PANEL   Collection Time    09/07/13  2:17 PM      Result Value Ref Range   Sodium 134 (*) 135 - 145 mEq/L   Potassium 4.1  3.5 - 5.1 mEq/L   Chloride 101  96 - 112 mEq/L   CO2 24  19 - 32 mEq/L   Glucose, Bld 93  70 - 99 mg/dL   BUN 11  6 - 23 mg/dL   Creatinine, Ser 0.67  0.50 - 1.10 mg/dL   Calcium 9.7  8.4 - 10.5 mg/dL   Total Protein 6.8  6.0 - 8.3 g/dL   Albumin 3.9  3.5 - 5.2 g/dL   AST 15  0 - 37 U/L   ALT 16  0 - 35 U/L   Alkaline Phosphatase 72  39 - 117 U/L   Total Bilirubin 0.8  0.3 - 1.2 mg/dL   GFR calc non Af Amer >90  >90 mL/min   GFR calc Af Amer >90  >90 mL/min  ETHANOL   Collection Time    09/07/13  2:17 PM      Result  Value Ref Range    Alcohol, Ethyl (B) <11  0 - 11 mg/dL  SALICYLATE LEVEL   Collection Time    09/07/13  2:17 PM      Result Value Ref Range   Salicylate Lvl <1.6 (*) 2.8 - 20.0 mg/dL  LITHIUM LEVEL   Collection Time    09/08/13 11:30 AM      Result Value Ref Range   Lithium Lvl 0.41 (*) 0.80 - 1.40 mEq/L  Results for orders placed in visit on 05/13/13 (from the past 8736 hour(s))  LIPID PANEL   Collection Time    05/13/13  9:38 AM      Result Value Ref Range   Cholesterol 174  0 - 200 mg/dL   Triglycerides 48  <150 mg/dL   HDL 69  >39 mg/dL   Total CHOL/HDL Ratio 2.5     VLDL 10  0 - 40 mg/dL   LDL Cholesterol 95  0 - 99 mg/dL   Mental status examination Patient is casually dressed and well groomed.  She is pleasant and cooperative.  She denies any active or passive suicidal thoughts homicidal thoughts.  She denies any auditory or visual hallucination.  Her speech is clear and coherent.  She describes her mood neutral and her affect is mood appropriate.  She has fine tremors but denies any stiffness .  There were no paranoia or delusions present at this time.  Her attention concentration is improved from the past.  She's alert and oriented x3.  Her insight judgment and impulse control is okay.  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  I will continue Topamax to 50 mg at bedtime, Risperdal 2 mg at bedtime, lithium 300 mg 5 times a day, Wellbutrin and Klonopin at present dose.  We will also issue a letter stating that she is taking psychotropic medication which may cause weight gain.  Patient does not want to change her medication.  Recommended to call us back if she has any question or any concern.  Follow up in 2 months.  Kamron Portee T., MD

## 2014-02-28 ENCOUNTER — Telehealth (HOSPITAL_COMMUNITY): Payer: Self-pay | Admitting: *Deleted

## 2014-02-28 ENCOUNTER — Telehealth (HOSPITAL_COMMUNITY): Payer: Self-pay

## 2014-02-28 NOTE — Telephone Encounter (Signed)
02/28/14 2:53pm Mailed letter to patient/sh

## 2014-02-28 NOTE — Telephone Encounter (Signed)
Left VM 3/6 @ 1041-VM rec'v 3/9 @ 840:Saw Dr. Adele Schilder 3/2.Did not read letter carefully when she left.Letter for [employer] needs to say weight cannot go over 160lb due to medical condition.Letter currently says go below and that is incorrect. Asks for new  Letter with correct wording to be sent to her. New letter printed and left for MD signature

## 2014-03-15 ENCOUNTER — Telehealth (HOSPITAL_COMMUNITY): Payer: Self-pay | Admitting: *Deleted

## 2014-03-15 NOTE — Telephone Encounter (Signed)
Patient requested call regarding disability paper work. Contacted patient: Patient has paperwork for disability.States Dr. Adele Schilder has filled it out in past when she saw him in [place].Informed patient forms can be mailed or faxed to office with directions.Informed her forms will be returned to her to send in to the disability company

## 2014-03-29 ENCOUNTER — Telehealth (HOSPITAL_COMMUNITY): Payer: Self-pay | Admitting: *Deleted

## 2014-03-29 NOTE — Telephone Encounter (Signed)
Patient left:VM:Were papers for disability completed?  Contacted patient. Informed patient that paperwork completed last week and given to MD for signature.Her instructions to mail to her and addressed envelope were attached to forms.

## 2014-04-05 ENCOUNTER — Other Ambulatory Visit (HOSPITAL_COMMUNITY): Payer: Self-pay | Admitting: Psychiatry

## 2014-04-05 DIAGNOSIS — F319 Bipolar disorder, unspecified: Secondary | ICD-10-CM

## 2014-04-07 ENCOUNTER — Other Ambulatory Visit (HOSPITAL_COMMUNITY): Payer: Self-pay | Admitting: *Deleted

## 2014-04-07 DIAGNOSIS — F319 Bipolar disorder, unspecified: Secondary | ICD-10-CM

## 2014-04-07 MED ORDER — CLONAZEPAM 1 MG PO TABS: ORAL_TABLET | ORAL | Status: DC

## 2014-04-07 NOTE — Telephone Encounter (Signed)
RX for Klonopin called to pharmacy.Printed in error.Chart corrected

## 2014-04-27 ENCOUNTER — Encounter (HOSPITAL_COMMUNITY): Payer: Self-pay | Admitting: Psychiatry

## 2014-04-27 ENCOUNTER — Ambulatory Visit (INDEPENDENT_AMBULATORY_CARE_PROVIDER_SITE_OTHER): Payer: PRIVATE HEALTH INSURANCE | Admitting: Psychiatry

## 2014-04-27 VITALS — BP 118/76 | HR 89 | Ht 63.0 in | Wt 160.0 lb

## 2014-04-27 DIAGNOSIS — F319 Bipolar disorder, unspecified: Secondary | ICD-10-CM

## 2014-04-27 DIAGNOSIS — Z79899 Other long term (current) drug therapy: Secondary | ICD-10-CM

## 2014-04-27 DIAGNOSIS — F29 Unspecified psychosis not due to a substance or known physiological condition: Secondary | ICD-10-CM

## 2014-04-27 MED ORDER — CLONAZEPAM 1 MG PO TABS: ORAL_TABLET | ORAL | Status: DC

## 2014-04-27 MED ORDER — RISPERIDONE 2 MG PO TABS: ORAL_TABLET | ORAL | Status: DC

## 2014-04-27 MED ORDER — LITHIUM CARBONATE 300 MG PO CAPS: ORAL_CAPSULE | ORAL | Status: DC

## 2014-04-27 MED ORDER — BUPROPION HCL ER (XL) 300 MG PO TB24: ORAL_TABLET | ORAL | Status: DC

## 2014-04-27 MED ORDER — TOPIRAMATE 50 MG PO TABS: ORAL_TABLET | ORAL | Status: DC

## 2014-04-27 NOTE — Progress Notes (Signed)
[facility] [phone] Progress Note [name] MRN: [id number] DOB: [birth year] Age: 53 y.o.  Date: 04/27/2014  Chief Complaint  Patient presents with  . Follow-up  . Medication Refill  . Fatigue    History of presenting illness. [name] came for her followup appointment.  She is complaining of feeling tired and fatigued.  She is compliant with medication but in the morning she gets very tired and she has no desire to leave her house.  She is sleeping okay.  After several months she decided to talk to her mother that she believed that okay.  Patient admitted she was feeling guilty about not talking to mother and now she did talk and feeling much calmer .  Patient continues to have some irritability and anxiety but overall she denies any hallucination paranoia or any agitation.  On her last visit we stopped Risperdal during the day however she continues to take at bedtime.  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  She is excited about going to [facility] for one week.  Patient told her friend has a beach house and they want her to cat sitting because her friend is going to [place]. .  Patient told her friend has 2 cats and she is excited about beach trip .  The patient was to reduce her lithium which she believes making her very fatigued.  She also concerned about her weight and despite on weight watcher she has unable to loose any more weight.  She denies any suicidal thoughts or homicidal thoughts.  Patient is not drinking or using any little substances.  She is living with her husband and youngest son who is 22 years old.  Past psychiatric history. Patient has history of bipolar disorder with multiple psychiatric inpatient treatment.  Her last admission was September 2014 for suicidal thinking and hallucination.  She was playing with the guns and superficially cut her wrist with a needle. Patient has no history of suicidal attempt but admitted  history of passive suicidal thinking and severe manic episode.  In the past she had tried Zyprexa Seroquel however she is very resistant and reluctant to take any medication that can cause weight gain.  She was tried tried Tegretol however she developed increased paranoia confusion and she was required hospitalization at behavioral [place].    Allergies: Allergies  Allergen Reactions  . Tegretol [Carbamazepine] Nausea And Vomiting  . Codeine Nausea Only  . Divalproex Sodium Swelling  . Erythromycin Nausea And Vomiting  . Phenytoin Sodium Extended Swelling   Medical History: Past Medical History  Diagnosis Date  . HTN (hypertension)   . Mania   . Bipolar disorder   . Anxiety   . Depression    she is to take antihypertensive medication but no longer says her perpetrator has been fairly stable.  Surgical History: Past Surgical History  Procedure Laterality Date  . Brain surgery    . Abdominal hysterectomy    . Knee surgery      right x 2  . Incontinence surgery     ROS  Vital Signs:BP 118/76  Pulse 89  Ht 5\' 3"  (1.6 m)  Wt 160 lb (72.576 kg)  BMI 28.35 kg/m2   Lab Results:  Results for orders placed during the hospital encounter of 09/08/13 (from the past 8736 hour(s))  VALPROIC ACID LEVEL   Collection Time    09/09/13  6:15 AM  Result Value Ref Range   Valproic Acid Lvl <10.0 (*) 50.0 - 100.0 ug/mL  LITHIUM LEVEL   Collection Time    09/13/13  6:10 AM      Result Value Ref Range   Lithium Lvl 0.64 (*) 0.80 - 1.40 mEq/L  Results for orders placed during the hospital encounter of 09/07/13 (from the past 8736 hour(s))  URINE RAPID DRUG SCREEN (HOSP PERFORMED)   Collection Time    09/07/13  2:14 PM      Result Value Ref Range   Opiates NONE DETECTED  NONE DETECTED   Cocaine NONE DETECTED  NONE DETECTED   Benzodiazepines NONE DETECTED  NONE DETECTED   Amphetamines NONE DETECTED  NONE DETECTED   Tetrahydrocannabinol NONE DETECTED  NONE DETECTED    Barbiturates NONE DETECTED  NONE DETECTED  URINALYSIS, ROUTINE W REFLEX MICROSCOPIC   Collection Time    09/07/13  2:14 PM      Result Value Ref Range   Color, Urine YELLOW  YELLOW   APPearance CLEAR  CLEAR   Specific Gravity, Urine 1.015  1.005 - 1.030   pH 7.5  5.0 - 8.0   Glucose, UA NEGATIVE  NEGATIVE mg/dL   Hgb urine dipstick NEGATIVE  NEGATIVE   Bilirubin Urine NEGATIVE  NEGATIVE   Ketones, ur NEGATIVE  NEGATIVE mg/dL   Protein, ur NEGATIVE  NEGATIVE mg/dL   Urobilinogen, UA 0.2  0.0 - 1.0 mg/dL   Nitrite NEGATIVE  NEGATIVE   Leukocytes, UA NEGATIVE  NEGATIVE  ACETAMINOPHEN LEVEL   Collection Time    09/07/13  2:17 PM      Result Value Ref Range   Acetaminophen (Tylenol), Serum <15.0  10 - 30 ug/mL  CBC   Collection Time    09/07/13  2:17 PM      Result Value Ref Range   WBC 9.1  4.0 - 10.5 K/uL   RBC 4.82  3.87 - 5.11 MIL/uL   Hemoglobin 15.4 (*) 12.0 - 15.0 g/dL   HCT 45.2  36.0 - 46.0 %   MCV 93.8  78.0 - 100.0 fL   MCH 32.0  26.0 - 34.0 pg   MCHC 34.1  30.0 - 36.0 g/dL   RDW 12.2  11.5 - 15.5 %   Platelets 290  150 - 400 K/uL  COMPREHENSIVE METABOLIC PANEL   Collection Time    09/07/13  2:17 PM      Result Value Ref Range   Sodium 134 (*) 135 - 145 mEq/L   Potassium 4.1  3.5 - 5.1 mEq/L   Chloride 101  96 - 112 mEq/L   CO2 24  19 - 32 mEq/L   Glucose, Bld 93  70 - 99 mg/dL   BUN 11  6 - 23 mg/dL   Creatinine, Ser 0.67  0.50 - 1.10 mg/dL   Calcium 9.7  8.4 - 10.5 mg/dL   Total Protein 6.8  6.0 - 8.3 g/dL   Albumin 3.9  3.5 - 5.2 g/dL   AST 15  0 - 37 U/L   ALT 16  0 - 35 U/L   Alkaline Phosphatase 72  39 - 117 U/L   Total Bilirubin 0.8  0.3 - 1.2 mg/dL   GFR calc non Af Amer >90  >90 mL/min   GFR calc Af Amer >90  >90 mL/min  ETHANOL   Collection Time    09/07/13  2:17 PM      Result Value Ref Range   Alcohol, Ethyl (B) <11  0 - 11 mg/dL  SALICYLATE LEVEL   Collection Time    09/07/13  2:17 PM      Result Value Ref Range   Salicylate Lvl <7.0  (*) 2.8 - 20.0 mg/dL  LITHIUM LEVEL   Collection Time    09/08/13 11:30 AM      Result Value Ref Range   Lithium Lvl 0.41 (*) 0.80 - 1.40 mEq/L  Results for orders placed in visit on 05/13/13 (from the past 8736 hour(s))  LIPID PANEL   Collection Time    05/13/13  9:38 AM      Result Value Ref Range   Cholesterol 174  0 - 200 mg/dL   Triglycerides 48  <150 mg/dL   HDL 69  >39 mg/dL   Total CHOL/HDL Ratio 2.5     VLDL 10  0 - 40 mg/dL   LDL Cholesterol 95  0 - 99 mg/dL   Mental status examination Patient is casually dressed and well groomed.  She is pleasant and cooperative.  She denies any active or passive suicidal thoughts homicidal thoughts.  She appears tired.  She denies any auditory or visual hallucination.  Her speech is clear and coherent.  She describes her mood neutral and her affect is mood appropriate.  She has fine tremors but denies any stiffness .  There were no paranoia or delusions present at this time.  Her attention concentration is improved from the past.  She's alert and oriented x3.  Her insight judgment and impulse control is okay.  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  In the past lowering the lithium caused her mania the patient insist that she wants to try again reducing the lithium dose.  I would reduce lithium 300 mg twice a day and 600 mg at bedtime.  I encouraged her to call us back if she started to feel worsening of the symptoms including any mania and lack of sleep.  Patient accepted and agreed with the plan.  I will continue Topamax to 50 mg at bedtime, Risperdal 2 mg at bedtime, Wellbutrin and Klonopin at present dose.  We will also do a lithium level including CBC, CMP hemoglobin A1c.  Recommend to watch her calorie intake and encouraged to do exercise.  I will see her  again in 2 months. Time spent 25 minutes.  More than 50% of the time spent in psychoeducation, counseling and coordination of care.  Discuss safety plan that anytime having active suicidal thoughts or homicidal thoughts then patient need to call 911 or go to the local emergency room.  [name], MD

## 2014-06-13 LAB — CBC WITH DIFFERENTIAL/PLATELET
Basophils Absolute: 0.1 10*3/uL (ref 0.0–0.1)
Basophils Relative: 1 % (ref 0–1)
Eosinophils Absolute: 0.1 10*3/uL (ref 0.0–0.7)
Eosinophils Relative: 1 % (ref 0–5)
HCT: 43.9 % (ref 36.0–46.0)
Hemoglobin: 15.1 g/dL — ABNORMAL HIGH (ref 12.0–15.0)
Lymphocytes Relative: 25 % (ref 12–46)
Lymphs Abs: 1.4 10*3/uL (ref 0.7–4.0)
MCH: 32.1 pg (ref 26.0–34.0)
MCHC: 34.4 g/dL (ref 30.0–36.0)
MCV: 93.4 fL (ref 78.0–100.0)
Monocytes Absolute: 0.5 10*3/uL (ref 0.1–1.0)
Monocytes Relative: 8 % (ref 3–12)
Neutro Abs: 3.7 10*3/uL (ref 1.7–7.7)
Neutrophils Relative %: 65 % (ref 43–77)
Platelets: 261 10*3/uL (ref 150–400)
RBC: 4.7 MIL/uL (ref 3.87–5.11)
RDW: 12.3 % (ref 11.5–15.5)
WBC: 5.7 10*3/uL (ref 4.0–10.5)

## 2014-06-13 LAB — HEMOGLOBIN A1C
Hgb A1c MFr Bld: 4.6 % (ref <5.7)
Mean Plasma Glucose: 85 mg/dL (ref <117)

## 2014-06-14 LAB — COMPREHENSIVE METABOLIC PANEL
ALT: 12 U/L (ref 0–35)
AST: 13 U/L (ref 0–37)
Albumin: 4.4 g/dL (ref 3.5–5.2)
Alkaline Phosphatase: 72 U/L (ref 39–117)
BUN: 13 mg/dL (ref 6–23)
CO2: 25 mEq/L (ref 19–32)
Calcium: 9.9 mg/dL (ref 8.4–10.5)
Chloride: 108 mEq/L (ref 96–112)
Creat: 0.96 mg/dL (ref 0.50–1.10)
Glucose, Bld: 83 mg/dL (ref 70–99)
Potassium: 4.3 mEq/L (ref 3.5–5.3)
Sodium: 139 mEq/L (ref 135–145)
Total Bilirubin: 0.9 mg/dL (ref 0.2–1.2)
Total Protein: 6.5 g/dL (ref 6.0–8.3)

## 2014-06-14 LAB — LITHIUM LEVEL: Lithium Lvl: 0.6 mEq/L — ABNORMAL LOW (ref 0.80–1.40)

## 2014-06-27 ENCOUNTER — Ambulatory Visit (INDEPENDENT_AMBULATORY_CARE_PROVIDER_SITE_OTHER): Payer: PRIVATE HEALTH INSURANCE | Admitting: Psychiatry

## 2014-06-27 ENCOUNTER — Encounter (HOSPITAL_COMMUNITY): Payer: Self-pay | Admitting: Psychiatry

## 2014-06-27 VITALS — BP 131/94 | HR 85 | Ht 63.0 in | Wt 161.4 lb

## 2014-06-27 DIAGNOSIS — F29 Unspecified psychosis not due to a substance or known physiological condition: Secondary | ICD-10-CM

## 2014-06-27 DIAGNOSIS — F319 Bipolar disorder, unspecified: Secondary | ICD-10-CM

## 2014-06-27 MED ORDER — CLONAZEPAM 1 MG PO TABS: ORAL_TABLET | ORAL | Status: DC

## 2014-06-27 MED ORDER — RISPERIDONE 2 MG PO TABS: ORAL_TABLET | ORAL | Status: DC

## 2014-06-27 MED ORDER — LITHIUM CARBONATE 300 MG PO CAPS: ORAL_CAPSULE | ORAL | Status: DC

## 2014-06-27 MED ORDER — BUPROPION HCL ER (XL) 300 MG PO TB24: ORAL_TABLET | ORAL | Status: DC

## 2014-06-27 MED ORDER — TOPIRAMATE 50 MG PO TABS: ORAL_TABLET | ORAL | Status: DC

## 2014-06-27 NOTE — Progress Notes (Signed)
[facility] [phone] Progress Note [name] MRN: [id number] DOB: [birth year] Age: 53 y.o.  Date: 06/27/2014  Chief Complaint  Patient presents with  . Follow-up  . Medication Refill    History of presenting illness. [name] came for her followup appointment.  She had a [place] time at the beach.  She spent 2 weeks there.  She stayed in her lithium 5 times a day.  On her last visit she was complaining of feeling tiredness and she was taking only 5 times a day however she reported her husband noticed irritability and anger and now she is taking lithium to her original dose.  She is taking 600 mg in the morning 300 mg at noon and 600 mg at bedtime.  She is sleeping better.  She is feeling much improved in her mood.  She denies any irritability , anger or any agitation.  She is keeping in touch with the mother and there has been no new issues with her.  She denies any paranoia or any hallucination.  She lives with her husband and her youngest son who is 9 year old.  Patient is not drinking or using any illegal substances.  She denies any tremors or shakes.  Her vitals are stable.  Her weight is unchanged from the past.  She had a blood work and her results are normal.  Her lithium level is 0.60.  Her hemoglobin A1c is normal.  Past psychiatric history. Patient has history of bipolar disorder with multiple psychiatric inpatient treatment.  Her last admission was September 2014 for suicidal thinking and hallucination.  She was playing with the guns and superficially cut her wrist with a needle. Patient has no history of suicidal attempt but admitted history of passive suicidal thinking and severe manic episode.  In the past she had tried Zyprexa Seroquel however she is very resistant and reluctant to take any medication that can cause weight gain.  She was tried tried Tegretol however she developed increased paranoia confusion and she was required hospitalization at behavioral [place].     Allergies: Allergies  Allergen Reactions  . Gabapentin Anaphylaxis  . Oxcarbazepine Anaphylaxis  . Tegretol [Carbamazepine] Nausea And Vomiting  . Codeine Nausea Only  . Divalproex Sodium Swelling  . Erythromycin Nausea And Vomiting  . Phenytoin Sodium Extended Swelling   Medical History: Past Medical History  Diagnosis Date  . HTN (hypertension)   . Mania   . Bipolar disorder   . Anxiety   . Depression    she is to take antihypertensive medication but no longer says her perpetrator has been fairly stable.  Surgical History: Past Surgical History  Procedure Laterality Date  . Brain surgery    . Abdominal hysterectomy    . Knee surgery      right x 2  . Incontinence surgery     ROS  Vital Signs:BP 131/94  Pulse 85  Ht _0  (1.6 m)  Wt 161 lb 6.4 oz (73.211 kg)  BMI 28.60 kg/m2   Lab Results:  Results for orders placed in visit on 04/27/14 (from the past 8736 hour(s))  HEMOGLOBIN A1C   Collection Time    06/13/14  9:42 AM      Result Value Ref Range   Hemoglobin A1C 4.6  <5.7 %   Mean Plasma Glucose 85  <117 mg/dL  LITHIUM LEVEL   Collection Time    06/13/14  9:42 AM      Result Value Ref Range   Lithium Lvl 0.60 (*)  0.80 - 1.40 mEq/L  CBC WITH DIFFERENTIAL   Collection Time    06/13/14  9:42 AM      Result Value Ref Range   WBC 5.7  4.0 - 10.5 K/uL   RBC 4.70  3.87 - 5.11 MIL/uL   Hemoglobin 15.1 (*) 12.0 - 15.0 g/dL   HCT 43.9  36.0 - 46.0 %   MCV 93.4  78.0 - 100.0 fL   MCH 32.1  26.0 - 34.0 pg   MCHC 34.4  30.0 - 36.0 g/dL   RDW 12.3  11.5 - 15.5 %   Platelets 261  150 - 400 K/uL   Neutrophils Relative % 65  43 - 77 %   Neutro Abs 3.7  1.7 - 7.7 K/uL   Lymphocytes Relative 25  12 - 46 %   Lymphs Abs 1.4  0.7 - 4.0 K/uL   Monocytes Relative 8  3 - 12 %   Monocytes Absolute 0.5  0.1 - 1.0 K/uL   Eosinophils Relative 1  0 - 5 %   Eosinophils Absolute 0.1  0.0 - 0.7 K/uL   Basophils Relative 1  0 - 1 %   Basophils Absolute 0.1  0.0 - 0.1  K/uL   Smear Review Criteria for review not met    COMPREHENSIVE METABOLIC PANEL   Collection Time    06/13/14  9:42 AM      Result Value Ref Range   Sodium 139  135 - 145 mEq/L   Potassium 4.3  3.5 - 5.3 mEq/L   Chloride 108  96 - 112 mEq/L   CO2 25  19 - 32 mEq/L   Glucose, Bld 83  70 - 99 mg/dL   BUN 13  6 - 23 mg/dL   Creat 0.96  0.50 - 1.10 mg/dL   Total Bilirubin 0.9  0.2 - 1.2 mg/dL   Alkaline Phosphatase 72  39 - 117 U/L   AST 13  0 - 37 U/L   ALT 12  0 - 35 U/L   Total Protein 6.5  6.0 - 8.3 g/dL   Albumin 4.4  3.5 - 5.2 g/dL   Calcium 9.9  8.4 - 10.5 mg/dL  Results for orders placed during the hospital encounter of 09/08/13 (from the past 8736 hour(s))  VALPROIC ACID LEVEL   Collection Time    09/09/13  6:15 AM      Result Value Ref Range   Valproic Acid Lvl <10.0 (*) 50.0 - 100.0 ug/mL  LITHIUM LEVEL   Collection Time    09/13/13  6:10 AM      Result Value Ref Range   Lithium Lvl 0.64 (*) 0.80 - 1.40 mEq/L  Results for orders placed during the hospital encounter of 09/07/13 (from the past 8736 hour(s))  URINE RAPID DRUG SCREEN (HOSP PERFORMED)   Collection Time    09/07/13  2:14 PM      Result Value Ref Range   Opiates NONE DETECTED  NONE DETECTED   Cocaine NONE DETECTED  NONE DETECTED   Benzodiazepines NONE DETECTED  NONE DETECTED   Amphetamines NONE DETECTED  NONE DETECTED   Tetrahydrocannabinol NONE DETECTED  NONE DETECTED   Barbiturates NONE DETECTED  NONE DETECTED  URINALYSIS, ROUTINE W REFLEX MICROSCOPIC   Collection Time    09/07/13  2:14 PM      Result Value Ref Range   Color, Urine YELLOW  YELLOW   APPearance CLEAR  CLEAR   Specific Gravity, Urine 1.015  1.005 - 1.030  pH 7.5  5.0 - 8.0   Glucose, UA NEGATIVE  NEGATIVE mg/dL   Hgb urine dipstick NEGATIVE  NEGATIVE   Bilirubin Urine NEGATIVE  NEGATIVE   Ketones, ur NEGATIVE  NEGATIVE mg/dL   Protein, ur NEGATIVE  NEGATIVE mg/dL   Urobilinogen, UA 0.2  0.0 - 1.0 mg/dL   Nitrite NEGATIVE   NEGATIVE   Leukocytes, UA NEGATIVE  NEGATIVE  ACETAMINOPHEN LEVEL   Collection Time    09/07/13  2:17 PM      Result Value Ref Range   Acetaminophen (Tylenol), Serum <15.0  10 - 30 ug/mL  CBC   Collection Time    09/07/13  2:17 PM      Result Value Ref Range   WBC 9.1  4.0 - 10.5 K/uL   RBC 4.82  3.87 - 5.11 MIL/uL   Hemoglobin 15.4 (*) 12.0 - 15.0 g/dL   HCT 45.2  36.0 - 46.0 %   MCV 93.8  78.0 - 100.0 fL   MCH 32.0  26.0 - 34.0 pg   MCHC 34.1  30.0 - 36.0 g/dL   RDW 12.2  11.5 - 15.5 %   Platelets 290  150 - 400 K/uL  COMPREHENSIVE METABOLIC PANEL   Collection Time    09/07/13  2:17 PM      Result Value Ref Range   Sodium 134 (*) 135 - 145 mEq/L   Potassium 4.1  3.5 - 5.1 mEq/L   Chloride 101  96 - 112 mEq/L   CO2 24  19 - 32 mEq/L   Glucose, Bld 93  70 - 99 mg/dL   BUN 11  6 - 23 mg/dL   Creatinine, Ser 0.67  0.50 - 1.10 mg/dL   Calcium 9.7  8.4 - 10.5 mg/dL   Total Protein 6.8  6.0 - 8.3 g/dL   Albumin 3.9  3.5 - 5.2 g/dL   AST 15  0 - 37 U/L   ALT 16  0 - 35 U/L   Alkaline Phosphatase 72  39 - 117 U/L   Total Bilirubin 0.8  0.3 - 1.2 mg/dL   GFR calc non Af Amer >90  >90 mL/min   GFR calc Af Amer >90  >90 mL/min  ETHANOL   Collection Time    09/07/13  2:17 PM      Result Value Ref Range   Alcohol, Ethyl (B) <11  0 - 11 mg/dL  SALICYLATE LEVEL   Collection Time    09/07/13  2:17 PM      Result Value Ref Range   Salicylate Lvl <1.6 (*) 2.8 - 20.0 mg/dL  LITHIUM LEVEL   Collection Time    09/08/13 11:30 AM      Result Value Ref Range   Lithium Lvl 0.41 (*) 0.80 - 1.40 mEq/L   Mental status examination Patient is casually dressed and well groomed.  She is pleasant and cooperative.  She denies any active or passive suicidal thoughts homicidal thoughts.  She appears tired.  She denies any auditory or visual hallucination.  Her speech is clear and coherent.  She describes her mood good and her affect is mood appropriate.  She denies any tremors or any shakes.   There were no paranoia or delusions present at this time.  Her attention concentration is good.  She's alert and oriented x3.  Her insight judgment and impulse control is okay.  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  Recommended to take lithium 600 mg in the morning, 300 mg at noon and 600 mg at bedtime.  Recommend to continue her other medication which is about 2 mg at bedtime, Topamax 50 mg at bedtime, Wellbutrin XL 300 mg daily, Klonopin 1.5 mg at bedtime.  I reviewed her blood work results with her.  Recommended to call us back if she has any question or any concern.  Followup in 3 months.  Iam Lipson T., MD

## 2014-09-20 ENCOUNTER — Encounter (HOSPITAL_COMMUNITY): Payer: Self-pay | Admitting: Psychiatry

## 2014-09-20 ENCOUNTER — Ambulatory Visit (INDEPENDENT_AMBULATORY_CARE_PROVIDER_SITE_OTHER): Payer: PRIVATE HEALTH INSURANCE | Admitting: Psychiatry

## 2014-09-20 VITALS — BP 121/86 | HR 78 | Ht 63.0 in | Wt 165.6 lb

## 2014-09-20 DIAGNOSIS — F319 Bipolar disorder, unspecified: Secondary | ICD-10-CM

## 2014-09-20 MED ORDER — CLONAZEPAM 1 MG PO TABS: ORAL_TABLET | ORAL | Status: DC

## 2014-09-20 MED ORDER — BUPROPION HCL ER (XL) 300 MG PO TB24
ORAL_TABLET | ORAL | Status: DC
Start: 2014-09-20 — End: 2014-11-14

## 2014-09-20 MED ORDER — TOPIRAMATE 25 MG PO TABS: ORAL_TABLET | ORAL | Status: DC

## 2014-09-20 MED ORDER — LITHIUM CARBONATE 300 MG PO CAPS: ORAL_CAPSULE | ORAL | Status: DC

## 2014-09-20 MED ORDER — RISPERIDONE 2 MG PO TABS: ORAL_TABLET | ORAL | Status: DC

## 2014-09-20 NOTE — Progress Notes (Signed)
[facility] [phone] Progress Note [name] MRN: [id number] DOB: 16-Feb-1961 Age: 53 y.o.  Date: 09/20/2014  Chief Complaint  Patient presents with  . Anxiety  . Depression  . Follow-up  . Stress  . Medication Refill    History of presenting illness. [name] came for her followup appointment.  She is taking her medication however sometimes she feels very tired and she continues to have manic episode.  She is not happy with his weight .  She admitted not keeping her diet and to control and does not do exercise but also she believed medicine causing weight gain.  She does not want to try a different medication because of the expense however wondering if something else can be done to lose weight.  Overall she is sleeping good.  She denies any suicidal thoughts or homicidal thoughts but admitted some time  Poor sleep, irritability, anger and mood swings.  She feels proud today because she drove by herself however she had a friend sitting next to her.  She denies any tremors or any shakes.  She denies any extrapyramidal side effects.  She admitted some time missing the dose of lithium .  However she put a reminder so she do not miss the lithium.  She is not interested in counseling.  She continues to talk to her mother only if needed.  Patient has a very tense relationship with her mother.  She lives with her husband and her youngest son who is 85 years old.  Patient is not drinking or using any illegal substances.  Her vitals are stable however she has gained 5 point last visit.  Past psychiatric history. Patient has history of bipolar disorder with multiple psychiatric inpatient treatment.  Her last admission was September 2014 for suicidal thinking and hallucination.  She was playing with the guns and superficially cut her wrist with a needle. Patient has no history of suicidal attempt but admitted history of passive suicidal thinking and severe manic episode.  In the past she had tried  Zyprexa Seroquel however she is very resistant and reluctant to take any medication that can cause weight gain.  She was tried tried Tegretol however she developed increased paranoia confusion and she was required hospitalization at behavioral [place].    Allergies: Allergies  Allergen Reactions  . Gabapentin Anaphylaxis  . Oxcarbazepine Anaphylaxis  . Tegretol [Carbamazepine] Nausea And Vomiting  . Codeine Nausea Only  . Divalproex Sodium Swelling  . Erythromycin Nausea And Vomiting  . Phenytoin Sodium Extended Swelling   Medical History: Past Medical History  Diagnosis Date  . HTN (hypertension)   . Mania   . Bipolar disorder   . Anxiety   . Depression    she is to take antihypertensive medication but no longer says her perpetrator has been fairly stable.  Surgical History: Past Surgical History  Procedure Laterality Date  . Brain surgery    . Abdominal hysterectomy    . Knee surgery      right x 2  . Incontinence surgery     ROS  Vital Signs:BP 121/86  Pulse 78  Ht 5' 3"  (1.6 m)  Wt 165 lb 9.6 oz (75.116 kg)  BMI 29.34 kg/m2   Lab Results:  Results for orders placed in visit on 04/27/14 (from the past 8736 hour(s))  HEMOGLOBIN A1C   Collection Time    06/13/14  9:42 AM      Result Value Ref Range   Hemoglobin A1C 4.6  <5.7 %  Mean Plasma Glucose 85  <117 mg/dL  LITHIUM LEVEL   Collection Time    06/13/14  9:42 AM      Result Value Ref Range   Lithium Lvl 0.60 (*) 0.80 - 1.40 mEq/L  CBC WITH DIFFERENTIAL   Collection Time    06/13/14  9:42 AM      Result Value Ref Range   WBC 5.7  4.0 - 10.5 K/uL   RBC 4.70  3.87 - 5.11 MIL/uL   Hemoglobin 15.1 (*) 12.0 - 15.0 g/dL   HCT 43.9  36.0 - 46.0 %   MCV 93.4  78.0 - 100.0 fL   MCH 32.1  26.0 - 34.0 pg   MCHC 34.4  30.0 - 36.0 g/dL   RDW 12.3  11.5 - 15.5 %   Platelets 261  150 - 400 K/uL   Neutrophils Relative % 65  43 - 77 %   Neutro Abs 3.7  1.7 - 7.7 K/uL   Lymphocytes Relative 25  12 - 46 %    Lymphs Abs 1.4  0.7 - 4.0 K/uL   Monocytes Relative 8  3 - 12 %   Monocytes Absolute 0.5  0.1 - 1.0 K/uL   Eosinophils Relative 1  0 - 5 %   Eosinophils Absolute 0.1  0.0 - 0.7 K/uL   Basophils Relative 1  0 - 1 %   Basophils Absolute 0.1  0.0 - 0.1 K/uL   Smear Review Criteria for review not met    COMPREHENSIVE METABOLIC PANEL   Collection Time    06/13/14  9:42 AM      Result Value Ref Range   Sodium 139  135 - 145 mEq/L   Potassium 4.3  3.5 - 5.3 mEq/L   Chloride 108  96 - 112 mEq/L   CO2 25  19 - 32 mEq/L   Glucose, Bld 83  70 - 99 mg/dL   BUN 13  6 - 23 mg/dL   Creat 0.96  0.50 - 1.10 mg/dL   Total Bilirubin 0.9  0.2 - 1.2 mg/dL   Alkaline Phosphatase 72  39 - 117 U/L   AST 13  0 - 37 U/L   ALT 12  0 - 35 U/L   Total Protein 6.5  6.0 - 8.3 g/dL   Albumin 4.4  3.5 - 5.2 g/dL   Calcium 9.9  8.4 - 10.5 mg/dL   Mental status examination Patient is casually dressed and well groomed.  She is pleasant and cooperative.  She maintained fair eye contact.  She denies any active or passive suicidal thoughts homicidal thoughts.  She denies any auditory or visual hallucination.  Her speech is clear and coherent.  She describes her mood anxious and her affect is labile. She denies any tremors or any shakes.  There were no paranoia or delusions present at this time.  Her attention concentration is good.  She's alert and oriented x3.  Her insight judgment and impulse control is okay.  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  Recommended to try Topamax 75 mg at bedtime to help with loss and target mood lability.  Patient is not interested in counseling.  Patient does not want to change her antidepressant or mood stabilizer.  I will continue lithium 600 mg in the morning, 300 mg at noon and  600 mg at bedtime, Risperdal 2 mg  at bedtime, Wellbutrin XL 300 mg daily, Klonopin 1.5 mg at bedtime.  Encouraged to take lithium as prescribed.  Recommended to call us back if she has any question or any concern.  Followup in 2 months.  ARFEEN,SYED T., MD

## 2014-09-27 ENCOUNTER — Ambulatory Visit (HOSPITAL_COMMUNITY): Payer: Self-pay | Admitting: Psychiatry

## 2014-11-14 ENCOUNTER — Encounter (HOSPITAL_COMMUNITY): Payer: Self-pay | Admitting: Psychiatry

## 2014-11-14 ENCOUNTER — Ambulatory Visit (INDEPENDENT_AMBULATORY_CARE_PROVIDER_SITE_OTHER): Payer: PRIVATE HEALTH INSURANCE | Admitting: Psychiatry

## 2014-11-14 VITALS — BP 129/91 | HR 75 | Ht 63.0 in | Wt 171.0 lb

## 2014-11-14 DIAGNOSIS — F29 Unspecified psychosis not due to a substance or known physiological condition: Secondary | ICD-10-CM

## 2014-11-14 DIAGNOSIS — F319 Bipolar disorder, unspecified: Secondary | ICD-10-CM

## 2014-11-14 MED ORDER — BUPROPION HCL ER (XL) 300 MG PO TB24: ORAL_TABLET | ORAL | Status: DC

## 2014-11-14 MED ORDER — CLONAZEPAM 1 MG PO TABS: ORAL_TABLET | ORAL | Status: DC

## 2014-11-14 MED ORDER — TOPIRAMATE 50 MG PO TABS: 50.0000 mg | ORAL_TABLET | Freq: Every day | ORAL | Status: DC

## 2014-11-14 MED ORDER — LITHIUM CARBONATE 600 MG PO CAPS: 600.0000 mg | ORAL_CAPSULE | Freq: Three times a day (TID) | ORAL | Status: DC

## 2014-11-14 NOTE — Progress Notes (Signed)
[facility] [phone] Progress Note [name] MRN: [id number] DOB: 1961/08/31 Age: 53 y.o.  Date: 11/14/2014  Chief Complaint  Patient presents with  . Depression  . Follow-up    History of presenting illness. [name] came for her followup appointment.  She is complaining of increased depression and anxiety.  She is upset because her 28 year old son moved out even though he is living a mild from her house but she is more depressed and anxious.  She also mentioned that holidays usually are difficult.  She admitted crying spells, racing thoughts, poor sleep and sometime feeling hopeless or worthless.  However she denies any suicidal thoughts.  She denies any paranoia or any hallucination.  Despite increasing Topamax she could not able to lose weight.  She has noticed that her memory is foggy and some time she gets dizziness .  She reported lack of energy and does not want to leave her house unless it is important.  She is taking all her medication as prescribed.  She is taking lithium 1500 mg a day along with Risperdal .  She scheduled to see her primary care physician in few weeks and she like to discuss weight loss program.  Patient denies drinking or using any illegal substances.  She is not interested in counseling.  Her husband is very supportive.  She continues to have issues with her mother and she does not talk to her unless it is important.  She admitted that holidays will make her more depressed and stressful.  Her appetite is okay.  She has gained few more pounds and she is not happy about it.   Past psychiatric history. Patient has history of bipolar disorder with multiple psychiatric inpatient treatment.  Her last admission was September 2014 for suicidal thinking and hallucination.  She was playing with the guns and superficially cut her wrist with a needle. Patient has no history of suicidal attempt but admitted history of passive suicidal thinking and severe manic episode.  In  the past she had tried Zyprexa Seroquel however she is very resistant and reluctant to take any medication that can cause weight gain.  She was tried tried Tegretol however she developed increased paranoia confusion and she was required hospitalization at behavioral [place].    Allergies: Allergies  Allergen Reactions  . Gabapentin Anaphylaxis  . Oxcarbazepine Anaphylaxis  . Tegretol [Carbamazepine] Nausea And Vomiting  . Codeine Nausea Only  . Divalproex Sodium Swelling  . Erythromycin Nausea And Vomiting  . Phenytoin Sodium Extended Swelling   Medical History: Patient has history of brain surgery, abdominal hysterectomy, surgery for incontinence and knee surgery.  She has history of hypertension however recently help her pressure is stable.   Her primary care physician is Dr. Kathrynn [name].    Review of Systems  Constitutional: Positive for malaise/fatigue.  Skin: Negative for itching and rash.  Neurological: Negative for headaches.  Psychiatric/Behavioral: Positive for depression. The patient is nervous/anxious and has insomnia.     Vital Signs:BP 129/91 mmHg  Pulse 75  Ht 5' 3" (1.6 m)  Wt 171 lb (77.565 kg)  BMI 30.30 kg/m2   Lab Results:  Results for orders placed or performed in visit on 04/27/14 (from the past 8736 hour(s))  Hemoglobin A1c   Collection Time: 06/13/14  9:42 AM  Result Value Ref Range   Hgb A1c MFr Bld 4.6 <5.7 %   Mean Plasma Glucose 85 <117 mg/dL  Lithium level   Collection Time: 06/13/14  9:42 AM  Result Value Ref Range   Lithium Lvl 0.60 (L) 0.80 - 1.40 mEq/L  CBC with Differential   Collection Time: 06/13/14  9:42 AM  Result Value Ref Range   WBC 5.7 4.0 - 10.5 K/uL   RBC 4.70 3.87 - 5.11 MIL/uL   Hemoglobin 15.1 (H) 12.0 - 15.0 g/dL   HCT 43.9 36.0 - 46.0 %   MCV 93.4 78.0 - 100.0 fL   MCH 32.1 26.0 - 34.0 pg   MCHC 34.4 30.0 - 36.0 g/dL   RDW 12.3 11.5 - 15.5 %   Platelets 261 150 - 400 K/uL   Neutrophils Relative % 65 43 - 77 %    Neutro Abs 3.7 1.7 - 7.7 K/uL   Lymphocytes Relative 25 12 - 46 %   Lymphs Abs 1.4 0.7 - 4.0 K/uL   Monocytes Relative 8 3 - 12 %   Monocytes Absolute 0.5 0.1 - 1.0 K/uL   Eosinophils Relative 1 0 - 5 %   Eosinophils Absolute 0.1 0.0 - 0.7 K/uL   Basophils Relative 1 0 - 1 %   Basophils Absolute 0.1 0.0 - 0.1 K/uL   Smear Review Criteria for review not met   Comprehensive metabolic panel   Collection Time: 06/13/14  9:42 AM  Result Value Ref Range   Sodium 139 135 - 145 mEq/L   Potassium 4.3 3.5 - 5.3 mEq/L   Chloride 108 96 - 112 mEq/L   CO2 25 19 - 32 mEq/L   Glucose, Bld 83 70 - 99 mg/dL   BUN 13 6 - 23 mg/dL   Creat 0.96 0.50 - 1.10 mg/dL   Total Bilirubin 0.9 0.2 - 1.2 mg/dL   Alkaline Phosphatase 72 39 - 117 U/L   AST 13 0 - 37 U/L   ALT 12 0 - 35 U/L   Total Protein 6.5 6.0 - 8.3 g/dL   Albumin 4.4 3.5 - 5.2 g/dL   Calcium 9.9 8.4 - 10.5 mg/dL   Mental status examination Patient is casually dressed and well groomed.  She is anxious, tearful but cooperative.  She maintained fair eye contact.  She denies any active or passive suicidal thoughts homicidal thoughts.  She described her mood sad and depressed and her affect is constricted.  She denies any auditory or visual hallucination.  Her speech is clear and coherent.  She denies any tremors or any shakes.  There were no paranoia or delusions present at this time.  Her attention concentration is good.  She's alert and oriented x3.  Her insight judgment and impulse control is okay.  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  She is taking lithium 1500 mg a day and I recommended to increase 1800 mg a day.  Her last lithium level is 0.60.  Patient does not have any  tremors or shakes.  I will also increase Klonopin to 2 mg at bedtime which will help her anxiety and insomnia.  Continue Risperdal and Wellbutrin at present dose.  One more time I encouraged her to see a counselor but patient declined.  Patient will see her primary care physician for weight loss program and I encouraged to keep that appointment.  Recommended to call us back if she has  any question or any concern.  Discussed medication side effects including lithium can cause tremors and shakes.  I will see her again in 6 weeks. Time spent 25 minutes.  More than 50% of the time spent in psychoeducation, counseling and coordination of care.  Discuss safety plan that anytime having active suicidal thoughts or homicidal thoughts then patient need to call 911 or go to the local emergency room.   Harleyquinn Gasser T., MD

## 2014-11-21 ENCOUNTER — Ambulatory Visit (HOSPITAL_COMMUNITY): Payer: Self-pay | Admitting: Psychiatry

## 2015-01-02 ENCOUNTER — Encounter (HOSPITAL_COMMUNITY): Payer: Self-pay | Admitting: Psychiatry

## 2015-01-02 ENCOUNTER — Ambulatory Visit (INDEPENDENT_AMBULATORY_CARE_PROVIDER_SITE_OTHER): Payer: 59 | Admitting: Psychiatry

## 2015-01-02 VITALS — BP 130/91 | HR 101 | Ht 63.0 in | Wt 173.4 lb

## 2015-01-02 DIAGNOSIS — F29 Unspecified psychosis not due to a substance or known physiological condition: Secondary | ICD-10-CM

## 2015-01-02 DIAGNOSIS — F319 Bipolar disorder, unspecified: Secondary | ICD-10-CM

## 2015-01-02 MED ORDER — TOPIRAMATE 50 MG PO TABS: 50.0000 mg | ORAL_TABLET | Freq: Every day | ORAL | Status: DC

## 2015-01-02 MED ORDER — LITHIUM CARBONATE 600 MG PO CAPS: 600.0000 mg | ORAL_CAPSULE | Freq: Three times a day (TID) | ORAL | Status: DC

## 2015-01-02 MED ORDER — CLONAZEPAM 1 MG PO TABS
ORAL_TABLET | ORAL | Status: DC
Start: 2015-01-02 — End: 2015-03-02

## 2015-01-02 MED ORDER — RISPERIDONE 2 MG PO TABS: ORAL_TABLET | ORAL | Status: DC

## 2015-01-02 MED ORDER — BUPROPION HCL ER (XL) 300 MG PO TB24: ORAL_TABLET | ORAL | Status: DC

## 2015-01-02 MED ORDER — HYDROXYZINE PAMOATE 25 MG PO CAPS: ORAL_CAPSULE | ORAL | Status: DC

## 2015-01-02 NOTE — Progress Notes (Signed)
[facility] [phone] Progress Note DESHAY KIRSTEIN MRN: [id number] DOB: May 22, 1961 Age: 54 y.o.  Date: 01/02/2015  Chief Complaint  Patient presents with  . Medication Refill  . Follow-up    History of presenting illness. Lula came for her followup appointment.  She has been sad because her father died next day of Christmas.  She was very tearful .  She wanted to go to funeral however her mother did not allow patients two son to attend funeral and patient decided not to go.  She admitted lately very isolated withdrawn and tearful.  She felt that increase lithium and Klonopin helped her a lot.  She was able to sleep good and she was feeling less depressed and less anxious until recently she lost her father.  Patient now agreed to see a therapist for grief counseling.  Overall she like her medication and denies any side effects.  She denies any paranoia or any hallucination.  However she reported decreased energy, sadness and lack of motivation to do things.  Recently she's seen her primary care physician who has blood work and it appears that her BUN and creatinine is normal.  Her primary care physician did not do lithium level .  Patient do not have any tremors or shakes and she like increased lithium.  Her last lithium level was 0.60.  Patient denies any dizziness or any concerns of medication.   Her appetite is okay.  Her vitals are stable.  She lives with her husband who is very supportive.  She is very close to her son who lives close by.  Patient denies drinking or using any illegal substances.  Past psychiatric history. Patient has history of bipolar disorder with multiple psychiatric inpatient treatment.  Her last admission was September 2014 for suicidal thinking and hallucination.  She was playing with the guns and superficially cut her wrist with a needle. Patient has no history of suicidal attempt but admitted history of passive suicidal thinking and severe manic episode.  In the past  she had tried Zyprexa Seroquel however she is very resistant and reluctant to take any medication that can cause weight gain.  She was tried tried Tegretol however she developed increased paranoia confusion and she was required hospitalization at behavioral [place].    Allergies: Allergies  Allergen Reactions  . Gabapentin Anaphylaxis  . Oxcarbazepine Anaphylaxis  . Tegretol [Carbamazepine] Nausea And Vomiting  . Codeine Nausea Only  . Divalproex Sodium Swelling  . Erythromycin Nausea And Vomiting  . Phenytoin Sodium Extended Swelling   Medical History: Patient has history of brain surgery, abdominal hysterectomy, surgery for incontinence and knee surgery.  She has history of hypertension however recently help her pressure is stable.   Her primary care physician is Dr. [name].    Review of Systems  Skin: Negative for itching and rash.  Neurological: Negative for headaches.  Psychiatric/Behavioral: The patient is nervous/anxious and has insomnia.     Vital Signs:BP 130/91 mmHg  Pulse 101  Ht _0  (1.6 m)  Wt 173 lb 6.4 oz (78.654 kg)  BMI 30.72 kg/m2   Lab Results:  Results for orders placed or performed in visit on 04/27/14 (from the past 8736 hour(s))  Hemoglobin A1c   Collection Time: 06/13/14  9:42 AM  Result Value Ref Range   Hgb A1c MFr Bld 4.6 <5.7 %   Mean Plasma Glucose 85 <117 mg/dL  Lithium level   Collection Time: 06/13/14  9:42 AM  Result Value Ref Range  Lithium Lvl 0.60 (L) 0.80 - 1.40 mEq/L  CBC with Differential   Collection Time: 06/13/14  9:42 AM  Result Value Ref Range   WBC 5.7 4.0 - 10.5 K/uL   RBC 4.70 3.87 - 5.11 MIL/uL   Hemoglobin 15.1 (H) 12.0 - 15.0 g/dL   HCT 43.9 36.0 - 46.0 %   MCV 93.4 78.0 - 100.0 fL   MCH 32.1 26.0 - 34.0 pg   MCHC 34.4 30.0 - 36.0 g/dL   RDW 12.3 11.5 - 15.5 %   Platelets 261 150 - 400 K/uL   Neutrophils Relative % 65 43 - 77 %   Neutro Abs 3.7 1.7 - 7.7 K/uL   Lymphocytes Relative 25 12 - 46 %    Lymphs Abs 1.4 0.7 - 4.0 K/uL   Monocytes Relative 8 3 - 12 %   Monocytes Absolute 0.5 0.1 - 1.0 K/uL   Eosinophils Relative 1 0 - 5 %   Eosinophils Absolute 0.1 0.0 - 0.7 K/uL   Basophils Relative 1 0 - 1 %   Basophils Absolute 0.1 0.0 - 0.1 K/uL   Smear Review Criteria for review not met   Comprehensive metabolic panel   Collection Time: 06/13/14  9:42 AM  Result Value Ref Range   Sodium 139 135 - 145 mEq/L   Potassium 4.3 3.5 - 5.3 mEq/L   Chloride 108 96 - 112 mEq/L   CO2 25 19 - 32 mEq/L   Glucose, Bld 83 70 - 99 mg/dL   BUN 13 6 - 23 mg/dL   Creat 0.96 0.50 - 1.10 mg/dL   Total Bilirubin 0.9 0.2 - 1.2 mg/dL   Alkaline Phosphatase 72 39 - 117 U/L   AST 13 0 - 37 U/L   ALT 12 0 - 35 U/L   Total Protein 6.5 6.0 - 8.3 g/dL   Albumin 4.4 3.5 - 5.2 g/dL   Calcium 9.9 8.4 - 10.5 mg/dL   Mental status examination Patient is casually dressed and well groomed.  She maintained fair eye contact.  She denies any active or passive suicidal thoughts homicidal thoughts.  She described her mood sad and depressed and her affect is constricted.  She denies any auditory or visual hallucination.  Her speech is clear and coherent.  She denies any tremors or any shakes.  There were no paranoia or delusions present at this time.  Her attention concentration is good.  She's alert and oriented x3.  Her insight judgment and impulse control is okay.  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  Now patient agreed to see therapist and I will schedule appointment with [name] in [place].  Patient has a lot of anxiety and sadness.  I recommended to try Vistaril 25 mg 1-2 capsule to help insomnia and anxiety.  Discussed medication side effects and benefits.  Continue lithium 1800 mg a day, Wellbutrin XL 300 mg daily, Klonopin 1 mg 2 tablet at bedtime, Risperdal 2 mg at bedtime and Topamax  50 mg at bedtime.  Recommended to call us back if she has any question, concern or if she feels worsening of the symptoms.  I will see her again in 3 months.  Time spent 25 minutes.  More than 50% of the time spent in psychoeducation, counseling and coordination of care.  Discuss safety plan that anytime having active suicidal thoughts or homicidal thoughts then patient need  to call 911 or go to the local emergency room.   Demarco Bacci T., MD

## 2015-01-17 ENCOUNTER — Ambulatory Visit (HOSPITAL_COMMUNITY): Payer: Self-pay | Admitting: Psychiatry

## 2015-01-22 ENCOUNTER — Other Ambulatory Visit (HOSPITAL_COMMUNITY): Payer: Self-pay | Admitting: Psychiatry

## 2015-01-23 ENCOUNTER — Other Ambulatory Visit (HOSPITAL_COMMUNITY): Payer: Self-pay | Admitting: Psychiatry

## 2015-01-23 NOTE — Telephone Encounter (Signed)
Given on 01/02/15.

## 2015-03-02 ENCOUNTER — Ambulatory Visit (INDEPENDENT_AMBULATORY_CARE_PROVIDER_SITE_OTHER): Payer: 59 | Admitting: Psychiatry

## 2015-03-02 ENCOUNTER — Encounter (HOSPITAL_COMMUNITY): Payer: Self-pay | Admitting: Psychiatry

## 2015-03-02 VITALS — BP 138/84 | HR 87 | Ht 63.0 in | Wt 179.0 lb

## 2015-03-02 DIAGNOSIS — F29 Unspecified psychosis not due to a substance or known physiological condition: Secondary | ICD-10-CM

## 2015-03-02 DIAGNOSIS — F319 Bipolar disorder, unspecified: Secondary | ICD-10-CM

## 2015-03-02 MED ORDER — BUPROPION HCL ER (XL) 300 MG PO TB24: ORAL_TABLET | ORAL | Status: DC

## 2015-03-02 MED ORDER — LITHIUM CARBONATE 600 MG PO CAPS: 600.0000 mg | ORAL_CAPSULE | Freq: Three times a day (TID) | ORAL | Status: DC

## 2015-03-02 MED ORDER — CLONAZEPAM 1 MG PO TABS: ORAL_TABLET | ORAL | Status: DC

## 2015-03-02 MED ORDER — TOPIRAMATE 50 MG PO TABS: 50.0000 mg | ORAL_TABLET | Freq: Every day | ORAL | Status: DC

## 2015-03-02 MED ORDER — HYDROXYZINE PAMOATE 25 MG PO CAPS: 25.0000 mg | ORAL_CAPSULE | Freq: Every day | ORAL | Status: DC | PRN

## 2015-03-02 MED ORDER — RISPERIDONE 2 MG PO TABS: ORAL_TABLET | ORAL | Status: DC

## 2015-03-02 NOTE — Progress Notes (Signed)
[facility] [phone] Progress Note [name] MRN: [id number] DOB: 07-02-1961 Age: 54 y.o.  Date: 03/02/2015  Chief Complaint  Patient presents with  . Follow-up    History of presenting illness. [name] came for her followup appointment.  She is doing better on her current medication.  She likes Vistaril which is helping her sleep and anxiety symptoms.  She did not schedule appointment with Vickii Chafe because she was sick but promised to schedule very soon.  Overall her mood has been stable.  She denies any irritability, anger, mood swing.  She admitted sometime feeling paranoid and not comfortable around people but lately she denies any suicidal thoughts or homicidal thought.  Recently she visited [place] and she had a good time.  Her husband is very supportive.  She was disappointed because her mother did not call since patient's father deceased.  Her appetite is okay.  Her vitals are stable.  She has no tremors or shakes.  Patient denies drinking or using any illegal substances.  She lives with her husband who is very supportive.  She is very close to her son who lives close by.  Past psychiatric history. Patient has history of bipolar disorder with multiple psychiatric inpatient treatment.  Her last admission was September 2014 for suicidal thinking and hallucination.  She was playing with the guns and superficially cut her wrist with a needle.  She has history of mania and psychosis .  In the past she had tried Zyprexa Seroquel however she is very resistant and reluctant to take any medication that can cause weight gain.  She was tried tried Tegretol however she developed increased paranoia confusion and she was required hospitalization at behavioral [facility].    Allergies: Allergies  Allergen Reactions  . Gabapentin Anaphylaxis  . Oxcarbazepine Anaphylaxis  . Tegretol [Carbamazepine] Nausea And Vomiting  . Codeine Nausea Only  . Divalproex Sodium Swelling  . Erythromycin Nausea  And Vomiting  . Phenytoin Sodium Extended Swelling   Medical History: Patient has history of brain surgery, abdominal hysterectomy, surgery for incontinence and knee surgery.  She has history of hypertension however recently help her pressure is stable.   Her primary care physician is Dr. [name].    ROS  Vital Signs:BP 138/84 mmHg  Pulse 87  Ht _0  (1.6 m)  Wt 179 lb (81.194 kg)  BMI 31.72 kg/m2   Lab Results:  Results for orders placed or performed in visit on 04/27/14 (from the past 8736 hour(s))  Hemoglobin A1c   Collection Time: 06/13/14  9:42 AM  Result Value Ref Range   Hgb A1c MFr Bld 4.6 <5.7 %   Mean Plasma Glucose 85 <117 mg/dL  Lithium level   Collection Time: 06/13/14  9:42 AM  Result Value Ref Range   Lithium Lvl 0.60 (L) 0.80 - 1.40 mEq/L  CBC with Differential   Collection Time: 06/13/14  9:42 AM  Result Value Ref Range   WBC 5.7 4.0 - 10.5 K/uL   RBC 4.70 3.87 - 5.11 MIL/uL   Hemoglobin 15.1 (H) 12.0 - 15.0 g/dL   HCT 43.9 36.0 - 46.0 %   MCV 93.4 78.0 - 100.0 fL   MCH 32.1 26.0 - 34.0 pg   MCHC 34.4 30.0 - 36.0 g/dL   RDW 12.3 11.5 - 15.5 %   Platelets 261 150 - 400 K/uL   Neutrophils Relative % 65 43 - 77 %   Neutro Abs 3.7 1.7 - 7.7 K/uL   Lymphocytes Relative 25  12 - 46 %   Lymphs Abs 1.4 0.7 - 4.0 K/uL   Monocytes Relative 8 3 - 12 %   Monocytes Absolute 0.5 0.1 - 1.0 K/uL   Eosinophils Relative 1 0 - 5 %   Eosinophils Absolute 0.1 0.0 - 0.7 K/uL   Basophils Relative 1 0 - 1 %   Basophils Absolute 0.1 0.0 - 0.1 K/uL   Smear Review Criteria for review not met   Comprehensive metabolic panel   Collection Time: 06/13/14  9:42 AM  Result Value Ref Range   Sodium 139 135 - 145 mEq/L   Potassium 4.3 3.5 - 5.3 mEq/L   Chloride 108 96 - 112 mEq/L   CO2 25 19 - 32 mEq/L   Glucose, Bld 83 70 - 99 mg/dL   BUN 13 6 - 23 mg/dL   Creat 0.96 0.50 - 1.10 mg/dL   Total Bilirubin 0.9 0.2 - 1.2 mg/dL   Alkaline Phosphatase 72 39 - 117 U/L   AST 13 0  - 37 U/L   ALT 12 0 - 35 U/L   Total Protein 6.5 6.0 - 8.3 g/dL   Albumin 4.4 3.5 - 5.2 g/dL   Calcium 9.9 8.4 - 10.5 mg/dL   Mental status examination Patient is casually dressed and well groomed.  She maintained fair eye contact.  She denies any active or passive suicidal thoughts homicidal thoughts.  She described her mood euthymic and her affect is appropriate.  She denies any auditory or visual hallucination.  Her speech is clear and coherent.  She denies any tremors or any shakes.  There were no paranoia or delusions present at this time.  Her attention concentration is good.  She's alert and oriented x3.  Her insight judgment and impulse control is okay.  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  Reinforce therapy with [name] in [place] .  Continue current medication.  We will do a lithium level and hemoglobin A1c .  Patient has no side effects.  I will continue Vistaril 25 mg 1 capsule as needed for insomnia and anxiety, lithium 1800 mg a day, Wellbutrin XL 300 mg daily, Klonopin 1 mg 2 tablet at bedtime, Risperdal 2 mg at bedtime and Topamax 50 mg at bedtime.  Recommended to call us back if she has any question, concern or if she feels worsening of the symptoms.  I will see her again in 3 months.    ARFEEN,SYED T., MD

## 2015-03-16 ENCOUNTER — Encounter (HOSPITAL_COMMUNITY): Payer: Self-pay | Admitting: Psychiatry

## 2015-03-16 ENCOUNTER — Ambulatory Visit (INDEPENDENT_AMBULATORY_CARE_PROVIDER_SITE_OTHER): Payer: 59 | Admitting: Psychiatry

## 2015-03-16 DIAGNOSIS — F319 Bipolar disorder, unspecified: Secondary | ICD-10-CM

## 2015-03-16 NOTE — Patient Instructions (Signed)
Discussed orally 
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
## 2015-03-17 LAB — HEMOGLOBIN A1C
Hgb A1c MFr Bld: 4.8 % (ref <5.7)
Mean Plasma Glucose: 91 mg/dL (ref <117)

## 2015-03-17 LAB — LITHIUM LEVEL: Lithium Lvl: 1.3 mEq/L (ref 0.80–1.40)

## 2015-04-03 ENCOUNTER — Ambulatory Visit (INDEPENDENT_AMBULATORY_CARE_PROVIDER_SITE_OTHER): Payer: 59 | Admitting: Psychiatry

## 2015-04-03 DIAGNOSIS — F319 Bipolar disorder, unspecified: Secondary | ICD-10-CM

## 2015-04-03 NOTE — Patient Instructions (Signed)
Discussed orally 

## 2015-04-03 NOTE — Progress Notes (Signed)
THERAPIST PROGRESS NOTE  Session Time: Monday 10:10 AM - 11:05 AM  Participation Level: Active  Behavioral Response: CasualAlertAnxious  Type of Therapy: Individual Therapy  Treatment Goals addressed:  Develop healthy thinking patterns and beliefs about self.      Identify and replace thoughts and behaviors that trigger manic and/or depressive episodes      Achieve a stable daily activity pattern              Interventions: Supportive  Summary: [name] is a 54 y.o. female who is referred for services by psychiatrist Dr. Adele Schilder to improve coping skills. Patient's father died Dec 24, 2014. Patient's mother would not allow patient's children to attend funeral because they have not spoken to her in years.  Therefore, patient did not attend the funeral. She was not able to have any closure with her father. Since that time, patient has experienced more stress and increased depressed mood. Patient and mother have a long history of problems in their relationship but have been estranged since patient's father's death. Patient fears running into mother in public because she doesn't know what mother will do or how she will act. Mother came to patient's house last Wednesday and was upset with patient and her husband as husband saw that the headstone had been placed  on patient's father's grave. Patient reports mother is very critical and negative of patient. Mother doesn't think patient has mental illness per patient's report. Patient also reports stress related to weight gain after intentionally  Losing 65 pounds 2 years ago. She reports this has caused her to be depressed and lose interest in sexual intimacy. She states being tired and having no energy to do anything, Patient expresses frustration as husband does not understand this is related to her illness rather than her choosing not to want to do anything.   Patient reports no contact from her mother since last session but still worrying  that mother may show up at any time and it any place where patient may go. She also worries she may be responsible for her mother's final expenses and her estate should her mother die. Patient does have a brother but says he is on the run for violation of probation. Patient has poor self image and expresses frustration with self as she has difficulty in environments where there was too much stimulation. She cites a recent incident of going to an event and being unable to stay due to the loud music and the activity. She then felt guilty for being unable to stay. Patient also reports worrying about a variety of issues including her children and any negative experiences they face. Patient reports a pattern of becoming bored, lonely, and depressed. She says she does well long as she is around people. Otherwise, she states basically doing nothing during the day. She is looking forward to going to the beach on May 1 and states she usually has a good time even when she is at the beach alone. She has noticed recently that she has been spending more than usual and says that her mood is changing.  Suicidal/Homicidal: No  Therapist Response: Therapist works with patient to establish rapport, develop treatment plan, provide psychoeducation regarding bipolar disorder, began to identify triggers and symptoms of mood, introduce mood and lifestyle tracker  Plan: Return again in 2 weeks.. Patient agrees to complete mood tracker and bring to next session.  Diagnosis: Axis I: Bipolar disorder    Axis II: Deferred  [place], [place] 04/03/2015

## 2015-04-17 ENCOUNTER — Ambulatory Visit (HOSPITAL_COMMUNITY): Payer: Self-pay | Admitting: Psychiatry

## 2015-05-01 ENCOUNTER — Ambulatory Visit (INDEPENDENT_AMBULATORY_CARE_PROVIDER_SITE_OTHER): Payer: 59 | Admitting: Psychiatry

## 2015-05-01 DIAGNOSIS — F319 Bipolar disorder, unspecified: Secondary | ICD-10-CM | POA: Diagnosis not present

## 2015-05-01 NOTE — Progress Notes (Signed)
    THERAPIST PROGRESS NOTE  Session Time: Monday 05/01/2015 10:00 AM - 10:55 AM  Participation Level: Active  Behavioral Response: CasualAlertAnxious/sad/tearful  Type of Therapy: Individual Therapy  Treatment Goals addressed:  Develop healthy thinking patterns and beliefs about self.      Identify and replace thoughts and behaviors that trigger manic and/or depressive episodes      Achieve a stable daily activity pattern              Interventions: Supportive  Summary: KOREN PLYLER is a 54 y.o. female who is referred for services by psychiatrist Dr. Adele Schilder to improve coping skills. Patient's father died 01-Jan-2015. Patient's mother would not allow patient's children to attend funeral because they have not spoken to her in years.  Therefore, patient did not attend the funeral. She was not able to have any closure with her father. Since that time, patient has experienced more stress and increased depressed mood. Patient and mother have a long history of problems in their relationship but have been estranged since patient's father's death. Patient fears running into mother in public because she doesn't know what mother will do or how she will act. Mother came to patient's house last Wednesday and was upset with patient and her husband as husband saw that the headstone had been placed  on patient's father's grave. Patient reports mother is very critical and negative of patient. Mother doesn't think patient has mental illness per patient's report. Patient also reports stress related to weight gain after intentionally  Losing 65 pounds 2 years ago. She reports this has caused her to be depressed and lose interest in sexual intimacy. She states being tired and having no energy to do anything, Patient expresses frustration as husband does not understand this is related to her illness rather than her choosing not to want to do anything.   Patient reports stable mood overall since last session.  She reports a few down days and says yesterday was difficult as it was Mother's Day. She enjoyed being with her children and grandchildren. However, she experiences sadness as she and her mother remain estranged. She continues to worry that mother may show up at any time and it any place where patient may go. She reports additional stress related to father-in-law as she has negative feelings about him due to the way he treated her now deceased mother-in-law per her report. She reports enjoying recent trip to the beach and being active and motivated while there. She states mainly just sitting in the recliner when home. She also expresses frustration regarding weight gain.     Suicidal/Homicidal: No  Therapist Response: Therapist works with patient to provide psychoeducation regarding bipolar disorder, praise patient's use of lifestyle tracker, process feelings, identify ways to increase daily structure/ routine and increase involvement in activity.   Plan: Return again in 2 weeks.. Patient agrees to complete mood tracker, use daily planner,  and bring to next session.  Diagnosis: Axis I: Bipolar disorder    Axis II: Deferred    Toshia Larkin, LCSW 05/01/2015

## 2015-05-01 NOTE — Patient Instructions (Signed)
Discussed orally 

## 2015-05-05 ENCOUNTER — Telehealth (HOSPITAL_COMMUNITY): Payer: Self-pay

## 2015-05-05 NOTE — Telephone Encounter (Signed)
Telephone call with patient after she left a message informing she would need yearly insurance forms filled out and requesting the office fax number to send forms.  Spoke to [name], assisted her with needed office fax number as she agreed to send forms today.  Informed [name] it would probably take a few office days to complete and requested she call back on Tuesday 05/09/15 to assess if ready to pick up.

## 2015-05-09 NOTE — Telephone Encounter (Signed)
Patient called this am to see if Shawn received her insurance forms.  I advised patient I will check with Shawn and she will receive a call back.  Patient verbalized understanding.

## 2015-05-10 ENCOUNTER — Emergency Department (HOSPITAL_COMMUNITY)
Admission: EM | Admit: 2015-05-10 | Discharge: 2015-05-10 | Disposition: A | Discharge status: Home / Self Care | Payer: 59 | Attending: Emergency Medicine | Admitting: Emergency Medicine

## 2015-05-10 ENCOUNTER — Encounter (HOSPITAL_COMMUNITY): Payer: Self-pay | Admitting: *Deleted

## 2015-05-10 ENCOUNTER — Emergency Department (HOSPITAL_COMMUNITY): Payer: 59

## 2015-05-10 DIAGNOSIS — Y998 Other external cause status: Secondary | ICD-10-CM | POA: Insufficient documentation

## 2015-05-10 DIAGNOSIS — Z79899 Other long term (current) drug therapy: Secondary | ICD-10-CM | POA: Insufficient documentation

## 2015-05-10 DIAGNOSIS — Y9289 Other specified places as the place of occurrence of the external cause: Secondary | ICD-10-CM | POA: Insufficient documentation

## 2015-05-10 DIAGNOSIS — F319 Bipolar disorder, unspecified: Secondary | ICD-10-CM | POA: Insufficient documentation

## 2015-05-10 DIAGNOSIS — I10 Essential (primary) hypertension: Secondary | ICD-10-CM | POA: Insufficient documentation

## 2015-05-10 DIAGNOSIS — S40011A Contusion of right shoulder, initial encounter: Secondary | ICD-10-CM | POA: Diagnosis not present

## 2015-05-10 DIAGNOSIS — W108XXA Fall (on) (from) other stairs and steps, initial encounter: Secondary | ICD-10-CM | POA: Insufficient documentation

## 2015-05-10 DIAGNOSIS — F419 Anxiety disorder, unspecified: Secondary | ICD-10-CM | POA: Diagnosis not present

## 2015-05-10 DIAGNOSIS — S4991XA Unspecified injury of right shoulder and upper arm, initial encounter: Secondary | ICD-10-CM | POA: Diagnosis present

## 2015-05-10 DIAGNOSIS — Y9389 Activity, other specified: Secondary | ICD-10-CM | POA: Diagnosis not present

## 2015-05-10 DIAGNOSIS — S0990XA Unspecified injury of head, initial encounter: Secondary | ICD-10-CM | POA: Diagnosis not present

## 2015-05-10 MED ORDER — HYDROMORPHONE HCL 1 MG/ML IJ SOLN
1.0000 mg | Freq: Once | INTRAMUSCULAR | Status: AC
Start: 2015-05-10 — End: 2015-05-10
  Administered 2015-05-10: 1 mg via INTRAMUSCULAR
  Filled 2015-05-10: qty 1

## 2015-05-10 MED ORDER — ONDANSETRON 8 MG PO TBDP
8.0000 mg | ORAL_TABLET | Freq: Once | ORAL | Status: AC
  Administered 2015-05-10: 8 mg via ORAL
  Filled 2015-05-10: qty 1

## 2015-05-10 MED ORDER — HYDROCODONE-ACETAMINOPHEN 5-325 MG PO TABS: 1.0000 | ORAL_TABLET | ORAL | Status: DC | PRN

## 2015-05-10 NOTE — Discharge Instructions (Signed)
Contusion °A contusion is a deep bruise. Contusions are the result of an injury that caused bleeding under the skin. The contusion may turn blue, purple, or yellow. Minor injuries will give you a painless contusion, but more severe contusions may stay painful and swollen for a few weeks.  °CAUSES  °A contusion is usually caused by a blow, trauma, or direct force to an area of the body. °SYMPTOMS  °· Swelling and redness of the injured area. °· Bruising of the injured area. °· Tenderness and soreness of the injured area. °· Pain. °DIAGNOSIS  °The diagnosis can be made by taking a history and physical exam. An X-ray, CT scan, or MRI may be needed to determine if there were any associated injuries, such as fractures. °TREATMENT  °Specific treatment will depend on what area of the body was injured. In general, the best treatment for a contusion is resting, icing, elevating, and applying cold compresses to the injured area. Over-the-counter medicines may also be recommended for pain control. Ask your caregiver what the best treatment is for your contusion. °HOME CARE INSTRUCTIONS  °· Put ice on the injured area. °· Put ice in a plastic bag. °· Place a towel between your skin and the bag. °· Leave the ice on for 15-20 minutes, 3-4 times a day, or as directed by your health care provider. °· Only take over-the-counter or prescription medicines for pain, discomfort, or fever as directed by your caregiver. Your caregiver may recommend avoiding anti-inflammatory medicines (aspirin, ibuprofen, and naproxen) for 48 hours because these medicines may increase bruising. °· Rest the injured area. °· If possible, elevate the injured area to reduce swelling. °SEEK IMMEDIATE MEDICAL CARE IF:  °· You have increased bruising or swelling. °· You have pain that is getting worse. °· Your swelling or pain is not relieved with medicines. °MAKE SURE YOU:  °· Understand these instructions. °· Will watch your condition. °· Will get help right  away if you are not doing well or get worse. °Document Released: 09/18/2005 Document Revised: 12/14/2013 Document Reviewed: 10/14/2011 °ExitCare® Patient Information ©2015 [employer]. This information is not intended to replace advice given to you by your health care provider. Make sure you discuss any questions you have with your health care provider. ° ° °Head Injury °You have received a head injury. It does not appear serious at this time. Headaches and vomiting are common following head injury. It should be easy to awaken from sleeping. Sometimes it is necessary for you to stay in the emergency department for a while for observation. Sometimes admission to the hospital may be needed. After injuries such as yours, most problems occur within the first 24 hours, but side effects may occur up to 7-10 days after the injury. It is important for you to carefully monitor your condition and contact your health care provider or seek immediate medical care if there is a change in your condition. °WHAT ARE THE TYPES OF HEAD INJURIES? °Head injuries can be as minor as a bump. Some head injuries can be more severe. More severe head injuries include: °· A jarring injury to the brain (concussion). °· A bruise of the brain (contusion). This mean there is bleeding in the brain that can cause swelling. °· A cracked skull (skull fracture). °· Bleeding in the brain that collects, clots, and forms a bump (hematoma). °WHAT CAUSES A HEAD INJURY? °A serious head injury is most likely to happen to someone who is in a car wreck and is not   wearing a seat belt. Other causes of major head injuries include bicycle or motorcycle accidents, sports injuries, and falls. °HOW ARE HEAD INJURIES DIAGNOSED? °A complete history of the event leading to the injury and your current symptoms will be helpful in diagnosing head injuries. Many times, pictures of the brain, such as CT or MRI are needed to see the extent of the injury. Often, an overnight  hospital stay is necessary for observation.  °WHEN SHOULD I SEEK IMMEDIATE MEDICAL CARE?  °You should get help right away if: °· You have confusion or drowsiness. °· You feel sick to your stomach (nauseous) or have continued, forceful vomiting. °· You have dizziness or unsteadiness that is getting worse. °· You have severe, continued headaches not relieved by medicine. Only take over-the-counter or prescription medicines for pain, fever, or discomfort as directed by your health care provider. °· You do not have normal function of the arms or legs or are unable to walk. °· You notice changes in the black spots in the center of the colored part of your eye (pupil). °· You have a clear or bloody fluid coming from your nose or ears. °· You have a loss of vision. °During the next 24 hours after the injury, you must stay with someone who can watch you for the warning signs. This person should contact local emergency services (911 in the [place] if you have seizures, you become unconscious, or you are unable to wake up. °HOW CAN I PREVENT A HEAD INJURY IN THE FUTURE? °The most important factor for preventing major head injuries is avoiding motor vehicle accidents.  To minimize the potential for damage to your head, it is crucial to wear seat belts while riding in motor vehicles. Wearing helmets while bike riding and playing collision sports (like football) is also helpful. Also, avoiding dangerous activities around the house will further help reduce your risk of head injury.  °WHEN CAN I RETURN TO NORMAL ACTIVITIES AND ATHLETICS? °You should be reevaluated by your health care provider before returning to these activities. If you have any of the following symptoms, you should not return to activities or contact sports until 1 week after the symptoms have stopped: °· Persistent headache. °· Dizziness or vertigo. °· Poor attention and concentration. °· Confusion. °· Memory problems. °· Nausea or vomiting. °· Fatigue or tire  easily. °· Irritability. °· Intolerant of bright lights or loud noises. °· Anxiety or depression. °· Disturbed sleep. °MAKE SURE YOU:  °· Understand these instructions. °· Will watch your condition. °· Will get help right away if you are not doing well or get worse. °Document Released: 12/09/2005 Document Revised: 12/14/2013 Document Reviewed: 08/16/2013 °ExitCare® Patient Information ©2015 [employer]. This information is not intended to replace advice given to you by your health care provider. Make sure you discuss any questions you have with your health care provider. ° °

## 2015-05-10 NOTE — ED Provider Notes (Signed)
CSN: [id number]     Arrival date & time 05/10/15  1750 History   First MD Initiated Contact with Patient 05/10/15 1820     Chief Complaint  Patient presents with  . Fall     (Consider location/radiation/quality/duration/timing/severity/associated sxs/prior Treatment) The history is provided by the patient.   [name] is a 54 y.o. female presenting with painful right shoulder.  She tripped as she was stepping over a baby gate then stepped on the sheet edge she was carrying, falling down a 12-14 step flight of steps, landing on her right shoulder.  This injury occurred about 3:30 today.  She hit her posterior head at the end of her fall, denies loc, dizziness, vomiting or focal weakness, also no visual changes, but does endorse nausea which she believes due to pain level.  She is unable to move the right shoulder without severe pain.  She has taken no medicines prior to arrival here for pain.   Past Medical History  Diagnosis Date  . HTN (hypertension)   . Mania   . Bipolar disorder   . Anxiety   . Depression    Past Surgical History  Procedure Laterality Date  . Brain surgery    . Abdominal hysterectomy    . Knee surgery      right x 2  . Incontinence surgery     Family History  Problem Relation Age of Onset  . Alcohol abuse Father   . Drug abuse Brother   . Dementia Neg Hx   . ADD / ADHD Neg Hx   . Anxiety disorder Neg Hx   . Bipolar disorder Neg Hx   . Depression Neg Hx   . OCD Neg Hx   . Paranoid behavior Neg Hx   . Schizophrenia Neg Hx   . Seizures Neg Hx   . Sexual abuse Neg Hx   . Physical abuse Neg Hx   . Suicidality Neg Hx   . Mental illness Mother    History  Substance Use Topics  . Smoking status: Never Smoker   . Smokeless tobacco: Never Used  . Alcohol Use: No   OB History    No data available     Review of Systems  Constitutional: Negative for fever.  Musculoskeletal: Positive for joint swelling and arthralgias. Negative for myalgias.   Neurological: Negative for dizziness, weakness, numbness and headaches.      Allergies  Gabapentin; Oxcarbazepine; Tegretol; Codeine; Divalproex sodium; Erythromycin; and Phenytoin sodium extended  Home Medications   Prior to Admission medications   Medication Sig Start Date End Date Taking? Authorizing Provider  buPROPion (WELLBUTRIN XL) 300 MG 24 hr tablet TAKE ONE TABLET BY MOUTH EVERY MORINING FOR DEPRESSION 03/02/15   [name], MD  clonazePAM (KLONOPIN) 1 MG tablet TAKE ONE -TWO TABLETS AT BEDTIME 03/02/15   [name], MD  HYDROcodone-acetaminophen (NORCO/VICODIN) 5-325 MG per tablet Take 1 tablet by mouth every 4 (four) hours as needed. 05/10/15   Evalee Jefferson, PA-C  hydrOXYzine (VISTARIL) 25 MG capsule Take 1 capsule (25 mg total) by mouth daily as needed for anxiety. 03/02/15   [name], MD  lithium 600 MG capsule Take 1 capsule (600 mg total) by mouth 3 (three) times daily with meals. 03/02/15   [name], MD  risperiDONE (RISPERDAL) 2 MG tablet TAKE ONE TABLET BY MOUTH AT BEDTIME AS NEEDED FOR MOOD CONTROL 03/02/15   [name], MD  topiramate (TOPAMAX) 50 MG tablet Take 1  tablet (50 mg total) by mouth daily. 03/02/15   [name], MD   BP 146/97 mmHg  Pulse 78  Temp(Src) 98.7 F (37.1 C) (Oral)  Resp 18  Ht 5\' 3"  (1.6 m)  Wt 188 lb (85.276 kg)  BMI 33.31 kg/m2  SpO2 100% Physical Exam  Constitutional: She appears well-developed and well-nourished.  HENT:  Head: Atraumatic.  Neck: Normal range of motion.  Cardiovascular:  Pulses:      Radial pulses are 2+ on the right side, and 2+ on the left side.  Pulses equal bilaterally  Musculoskeletal: She exhibits tenderness.       Right shoulder: She exhibits decreased range of motion, bony tenderness and swelling. She exhibits no deformity, no laceration, no spasm and normal pulse.  ttp right shoulder and humerus from mid anterior humerus through the lateral humeral head.  Neurological: She is alert. She  has normal strength. She displays normal reflexes. No sensory deficit.  Distal sensation intact bilateral hands.  Skin: Skin is warm and dry.  Psychiatric: She has a normal mood and affect.    ED Course  Procedures (including critical care time) Labs Review Labs Reviewed - No data to display  Imaging Review Dg Shoulder Right  05/10/2015   CLINICAL DATA:  Recent slip and fall with right shoulder pain  EXAM: RIGHT SHOULDER - 2+ VIEW  COMPARISON:  None.  FINDINGS: There is no evidence of fracture or dislocation. There is no evidence of arthropathy or other focal bone abnormality. Soft tissues are unremarkable.  IMPRESSION: No acute abnormality noted.   Electronically Signed   By: Inez Catalina M.D.   On: 05/10/2015 19:01   Dg Humerus Right  05/10/2015   CLINICAL DATA:  Recent tripping injury, right arm pain, initial encounter  EXAM: RIGHT HUMERUS - 2+ VIEW  COMPARISON:  None.  FINDINGS: There is no evidence of fracture or other focal bone lesions. Soft tissues are unremarkable.  IMPRESSION: No acute abnormality noted.   Electronically Signed   By: Inez Catalina M.D.   On: 05/10/2015 19:02     EKG Interpretation None      MDM   Final diagnoses:  Shoulder contusion, right, initial encounter  Minor head injury without loss of consciousness, initial encounter    Patients labs and/or radiological studies were reviewed and considered during the medical decision making and disposition process.  Results were also discussed with patient. No fx/ no dislocation. Advised pt probably soft tissue bruising, cannot r/o shoulder joint ligament/ tendon injury. Advised ice, rest, sling provided.  Hydrocodone prn pain. F/u with pcp if sx persist beyond the next 7-10 days.    Pt had minor head injury with no LOC and no defervescence during stay here.  No exam findings or hx suggesting need for imaging.  She was given minor head injury instructions.  Prn f/u as well for any persistent or worsened  sx.   Evalee Jefferson, PA-C 05/11/15 [id number]  Milton Ferguson, MD 05/13/15 [phone]

## 2015-05-10 NOTE — Telephone Encounter (Signed)
Notified patient that paper work was filled out, Dr. Adele Schilder will sign and we will fax in by the end of the day today. Copy will be made to be scanned in patients chart once Dr. Adele Schilder signs.

## 2015-05-10 NOTE — ED Notes (Signed)
[address] [address] today when stepping over baby gate and stepped on a sheet she was carrying.  Fell onto R shoulder on wooden steps.  Very limited movement of R arm. Pain radiating into R hand. Pain at bicepital head to top of shoulder. Arm warm w/2+ radial pulse and <3 sec cap refill.

## 2015-05-11 ENCOUNTER — Telehealth (HOSPITAL_COMMUNITY): Payer: Self-pay

## 2015-05-11 NOTE — Telephone Encounter (Signed)
Telephone call with patient to inform her [employer] forms that patient requested be completed for annual review were ready.  Patient requested a copy of the forms be faxed to her at [phone] and also a copy mailed to her [address] [place] [address] address.  Form was faxed to patient and also a copy mailed to her requested address.  Original to be scanned into [employer].

## 2015-05-15 ENCOUNTER — Ambulatory Visit (HOSPITAL_COMMUNITY): Payer: Self-pay | Admitting: Psychiatry

## 2015-05-15 ENCOUNTER — Encounter (HOSPITAL_COMMUNITY): Payer: Self-pay | Admitting: *Deleted

## 2015-05-28 ENCOUNTER — Other Ambulatory Visit (HOSPITAL_COMMUNITY): Payer: Self-pay | Admitting: Psychiatry

## 2015-05-29 ENCOUNTER — Ambulatory Visit (HOSPITAL_COMMUNITY): Payer: Self-pay | Admitting: Psychiatry

## 2015-05-29 ENCOUNTER — Ambulatory Visit (INDEPENDENT_AMBULATORY_CARE_PROVIDER_SITE_OTHER): Payer: 59 | Admitting: Psychiatry

## 2015-05-29 DIAGNOSIS — F319 Bipolar disorder, unspecified: Secondary | ICD-10-CM

## 2015-05-29 NOTE — Patient Instructions (Signed)
Discussed orally 

## 2015-05-29 NOTE — Progress Notes (Signed)
    THERAPIST PROGRESS NOTE  Session Time: Monday 05/01/2015 10:00 AM - 10:55 AM  Participation Level: Active  Behavioral Response: CasualAlertAnxious  Type of Therapy: Individual Therapy  Treatment Goals addressed:  Develop healthy thinking patterns and beliefs about self.      Identify and replace thoughts and behaviors that trigger manic and/or depressive episodes      Achieve a stable daily activity pattern              Interventions: Supportive  Summary: [name] is a 54 y.o. female who is referred for services by psychiatrist Dr. Adele Schilder to improve coping skills. Patient's father died 01-05-2015. Patient's mother would not allow patient's children to attend funeral because they have not spoken to her in years.  Therefore, patient did not attend the funeral. She was not able to have any closure with her father. Since that time, patient has experienced more stress and increased depressed mood. Patient and mother have a long history of problems in their relationship but have been estranged since patient's father's death. Patient fears running into mother in public because she doesn't know what mother will do or how she will act. Mother came to patient's house last Wednesday and was upset with patient and her husband as husband saw that the headstone had been placed  on patient's father's grave. Patient reports mother is very critical and negative of patient. Mother doesn't think patient has mental illness per patient's report. Patient also reports stress related to weight gain after intentionally  Losing 65 pounds 2 years ago. She reports this has caused her to be depressed and lose interest in sexual intimacy. She states being tired and having no energy to do anything, Patient expresses frustration as husband does not understand this is related to her illness rather than her choosing not to want to do anything.   Patient reports increased stress since last session. She fell down the  stairs at her home and injured her left right shoulder. She now is wearing a sling and some of her activities have been limited. She is very critical of self for falling. She also expresses sadness and frustration as she was informed by someone that patient's mother has started dating. This triggers unresolved grief and loss issues related to death of patient's father five months ago as well as sadness about patient's lack of positive relationship with her mother. She shares more information today regarding relationship with her parents who were not supportive or affectionate with patient during childhood. Patient reports she has experienced depression for about 4 days since last session. 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 She is looking forward to a 14-day stay at the beach on June 22, 2015.   Suicidal/Homicidal: No  Therapist Response: Therapist works with patient to provide psychoeducation regarding bipolar disorder, process grief and loss issues, identify connection between thoughts, mood, and behavior, identify ways to reframe negative thoughts  Plan: Return again in 4 weeks.  Diagnosis: Axis I: Bipolar disorder    Axis II: Deferred    Joshlyn Beadle, LCSW 05/29/2015

## 2015-05-30 ENCOUNTER — Other Ambulatory Visit (HOSPITAL_COMMUNITY): Payer: Self-pay | Admitting: Psychiatry

## 2015-05-30 DIAGNOSIS — F319 Bipolar disorder, unspecified: Secondary | ICD-10-CM

## 2015-05-30 MED ORDER — RISPERIDONE 2 MG PO TABS: ORAL_TABLET | ORAL | Status: DC

## 2015-05-30 MED ORDER — LITHIUM CARBONATE 600 MG PO CAPS: 600.0000 mg | ORAL_CAPSULE | Freq: Three times a day (TID) | ORAL | Status: DC

## 2015-06-02 ENCOUNTER — Ambulatory Visit (HOSPITAL_COMMUNITY): Payer: Self-pay | Admitting: Psychiatry

## 2015-06-08 ENCOUNTER — Encounter (HOSPITAL_COMMUNITY): Payer: Self-pay | Admitting: Psychiatry

## 2015-06-08 ENCOUNTER — Ambulatory Visit (INDEPENDENT_AMBULATORY_CARE_PROVIDER_SITE_OTHER): Payer: 59 | Admitting: Psychiatry

## 2015-06-08 DIAGNOSIS — F319 Bipolar disorder, unspecified: Secondary | ICD-10-CM

## 2015-06-08 DIAGNOSIS — F29 Unspecified psychosis not due to a substance or known physiological condition: Secondary | ICD-10-CM

## 2015-06-08 MED ORDER — BUPROPION HCL ER (XL) 300 MG PO TB24: ORAL_TABLET | ORAL | Status: DC

## 2015-06-08 MED ORDER — HYDROXYZINE PAMOATE 25 MG PO CAPS: 25.0000 mg | ORAL_CAPSULE | Freq: Every day | ORAL | Status: DC | PRN

## 2015-06-08 MED ORDER — RISPERIDONE 2 MG PO TABS: ORAL_TABLET | ORAL | Status: DC

## 2015-06-08 MED ORDER — CLONAZEPAM 1 MG PO TABS: ORAL_TABLET | ORAL | Status: DC

## 2015-06-08 MED ORDER — TOPIRAMATE 50 MG PO TABS: 50.0000 mg | ORAL_TABLET | Freq: Every day | ORAL | Status: DC

## 2015-06-08 NOTE — Progress Notes (Signed)
[facility] [phone] Progress Note [name] MRN: [id number] DOB: April 10, 1961 Age: 54 y.o.  Date: 06/08/2015  Chief Complaint  Patient presents with  . Follow-up    History of presenting illness. [name] came for her followup appointment.  She is taking her medication as prescribed.  Few days ago she had a birthday and her mother call that she did not response.  She feels good about it.  Patient is keeping a distance from her mother which usually causes problem in her life.  She sleeping better.  She denies any irritability, anger or any mood swing.  She fell few weeks ago and seen in the emergency room.  She hurt her shoulder and she was given injection and recommended to take by mouth pain medication.  However she did not continue oral pain medication because she does not want to get addicted.  She is happy that she is going to the beach for 2 weeks.  She reported her mood has been stable and recently there has no suicidal thoughts.  Her appetite is okay.  She denies any hallucination, paranoia or any aggressive behavior.  Her lithium level is 1.30 and her hemoglobin A1c is normal.  Patient denies drinking or using any illegal substances.  She went to see [name] but she had heart time connecting with therapists.  She is not sure if she like to continue counseling with [name].  Past psychiatric history. Patient has history of bipolar disorder with multiple psychiatric inpatient treatment.  Her last admission was September 2014 for suicidal thinking and hallucination.  She was playing with the guns and superficially cut her wrist with a needle.  She has history of mania and psychosis .  In the past she had tried Zyprexa Seroquel however she is very resistant and reluctant to take any medication that can cause weight gain.  She was tried tried Tegretol however she developed increased paranoia confusion and she was required hospitalization at behavioral [place].     Allergies: Allergies  Allergen Reactions  . Gabapentin Anaphylaxis  . Oxcarbazepine Anaphylaxis  . Tegretol [Carbamazepine] Nausea And Vomiting  . Codeine Nausea Only  . Divalproex Sodium Swelling  . Erythromycin Nausea And Vomiting  . Phenytoin Sodium Extended Swelling   Medical History: Patient has history of brain surgery, abdominal hysterectomy, surgery for incontinence and knee surgery.  She has history of hypertension however recently help her pressure is stable.   Her primary care physician is Dr. [name].    Review of Systems  Constitutional: Negative.   Gastrointestinal: Negative for nausea.  Musculoskeletal: Positive for joint pain.  Skin: Negative for itching and rash.  Neurological: Negative for dizziness and tremors.    Vital Signs:There were no vitals taken for this visit.   Lab Results:  Results for orders placed or performed in visit on 03/02/15 (from the past 8736 hour(s))  Hemoglobin A1c   Collection Time: 03/16/15  9:06 AM  Result Value Ref Range   Hgb A1c MFr Bld 4.8 <5.7 %   Mean Plasma Glucose 91 <117 mg/dL  Lithium level   Collection Time: 03/16/15  9:06 AM  Result Value Ref Range   Lithium Lvl 1.30 0.80 - 1.40 mEq/L  Results for orders placed or performed in visit on 04/27/14 (from the past 8736 hour(s))  Hemoglobin A1c   Collection Time: 06/13/14  9:42 AM  Result Value Ref Range   Hgb A1c MFr Bld 4.6 <5.7 %   Mean Plasma Glucose 85 <117 mg/dL  Lithium level   Collection Time: 06/13/14  9:42 AM  Result Value Ref Range   Lithium Lvl 0.60 (L) 0.80 - 1.40 mEq/L  CBC with Differential   Collection Time: 06/13/14  9:42 AM  Result Value Ref Range   WBC 5.7 4.0 - 10.5 K/uL   RBC 4.70 3.87 - 5.11 MIL/uL   Hemoglobin 15.1 (H) 12.0 - 15.0 g/dL   HCT 43.9 36.0 - 46.0 %   MCV 93.4 78.0 - 100.0 fL   MCH 32.1 26.0 - 34.0 pg   MCHC 34.4 30.0 - 36.0 g/dL   RDW 12.3 11.5 - 15.5 %   Platelets 261 150 - 400 K/uL   Neutrophils Relative % 65 43 - 77  %   Neutro Abs 3.7 1.7 - 7.7 K/uL   Lymphocytes Relative 25 12 - 46 %   Lymphs Abs 1.4 0.7 - 4.0 K/uL   Monocytes Relative 8 3 - 12 %   Monocytes Absolute 0.5 0.1 - 1.0 K/uL   Eosinophils Relative 1 0 - 5 %   Eosinophils Absolute 0.1 0.0 - 0.7 K/uL   Basophils Relative 1 0 - 1 %   Basophils Absolute 0.1 0.0 - 0.1 K/uL   Smear Review Criteria for review not met   Comprehensive metabolic panel   Collection Time: 06/13/14  9:42 AM  Result Value Ref Range   Sodium 139 135 - 145 mEq/L   Potassium 4.3 3.5 - 5.3 mEq/L   Chloride 108 96 - 112 mEq/L   CO2 25 19 - 32 mEq/L   Glucose, Bld 83 70 - 99 mg/dL   BUN 13 6 - 23 mg/dL   Creat 0.96 0.50 - 1.10 mg/dL   Total Bilirubin 0.9 0.2 - 1.2 mg/dL   Alkaline Phosphatase 72 39 - 117 U/L   AST 13 0 - 37 U/L   ALT 12 0 - 35 U/L   Total Protein 6.5 6.0 - 8.3 g/dL   Albumin 4.4 3.5 - 5.2 g/dL   Calcium 9.9 8.4 - 10.5 mg/dL   Mental status examination Patient is casually dressed and well groomed.  She maintained fair eye contact.  She denies any active or passive suicidal thoughts homicidal thoughts.  She described her mood euthymic and her affect is appropriate.  She denies any auditory or visual hallucination.  Her speech is clear and coherent.  She denies any tremors or any shakes.  There were no paranoia or delusions present at this time.  Her attention concentration is good.  She's alert and oriented x3.  Her insight judgment and impulse control is okay.  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  Patient is not sure if she wants to continue therapy with [name] .  She has no side effects of medication.  I reviewed lithium level and hemoglobin A1c results with her.  I will continue her current medication.  Continue Vistaril 25 mg 1 capsule as needed for insomnia and anxiety, lithium 1800 mg a day, Wellbutrin XL 300 mg  daily, Klonopin 1 mg 2 tablet at bedtime, Risperdal 2 mg at bedtime and Topamax 50 mg at bedtime.  Recommended to call us back if she has any question, concern or if she feels worsening of the symptoms.  I will see her again in 3 months.    Marchello Rothgeb T., MD

## 2015-08-03 ENCOUNTER — Other Ambulatory Visit (HOSPITAL_COMMUNITY): Payer: Self-pay | Admitting: Psychiatry

## 2015-08-08 ENCOUNTER — Other Ambulatory Visit (HOSPITAL_COMMUNITY): Payer: Self-pay | Admitting: Psychiatry

## 2015-08-08 DIAGNOSIS — F319 Bipolar disorder, unspecified: Secondary | ICD-10-CM

## 2015-08-08 MED ORDER — LITHIUM CARBONATE 600 MG PO CAPS: 600.0000 mg | ORAL_CAPSULE | Freq: Three times a day (TID) | ORAL | Status: DC

## 2015-09-09 ENCOUNTER — Other Ambulatory Visit (HOSPITAL_COMMUNITY): Payer: Self-pay | Admitting: Psychiatry

## 2015-09-11 ENCOUNTER — Other Ambulatory Visit (HOSPITAL_COMMUNITY): Payer: Self-pay | Admitting: Psychiatry

## 2015-09-11 ENCOUNTER — Ambulatory Visit (HOSPITAL_COMMUNITY): Payer: Self-pay | Admitting: Psychiatry

## 2015-09-11 DIAGNOSIS — F319 Bipolar disorder, unspecified: Secondary | ICD-10-CM

## 2015-09-11 MED ORDER — HYDROXYZINE PAMOATE 25 MG PO CAPS: 25.0000 mg | ORAL_CAPSULE | Freq: Every day | ORAL | Status: DC | PRN

## 2015-09-19 ENCOUNTER — Ambulatory Visit (INDEPENDENT_AMBULATORY_CARE_PROVIDER_SITE_OTHER): Payer: 59 | Admitting: Psychiatry

## 2015-09-19 ENCOUNTER — Encounter (HOSPITAL_COMMUNITY): Payer: Self-pay | Admitting: Psychiatry

## 2015-09-19 VITALS — BP 132/78 | HR 80 | Ht 67.0 in | Wt 189.0 lb

## 2015-09-19 DIAGNOSIS — F319 Bipolar disorder, unspecified: Secondary | ICD-10-CM | POA: Diagnosis not present

## 2015-09-19 DIAGNOSIS — F29 Unspecified psychosis not due to a substance or known physiological condition: Secondary | ICD-10-CM | POA: Diagnosis not present

## 2015-09-19 MED ORDER — HYDROXYZINE PAMOATE 25 MG PO CAPS
25.0000 mg | ORAL_CAPSULE | Freq: Every day | ORAL | Status: DC | PRN
Start: 2015-09-19 — End: 2015-12-26

## 2015-09-19 MED ORDER — TOPIRAMATE 50 MG PO TABS: 50.0000 mg | ORAL_TABLET | Freq: Every day | ORAL | Status: DC

## 2015-09-19 MED ORDER — BUPROPION HCL ER (XL) 300 MG PO TB24: ORAL_TABLET | ORAL | Status: DC

## 2015-09-19 MED ORDER — LITHIUM CARBONATE 600 MG PO CAPS: 600.0000 mg | ORAL_CAPSULE | Freq: Three times a day (TID) | ORAL | Status: DC

## 2015-09-19 MED ORDER — CLONAZEPAM 1 MG PO TABS: ORAL_TABLET | ORAL | Status: DC

## 2015-09-19 MED ORDER — RISPERIDONE 2 MG PO TABS: ORAL_TABLET | ORAL | Status: DC

## 2015-09-19 NOTE — Progress Notes (Signed)
[facility] [phone] Progress Note [name] MRN: [id number] DOB: [birth year] Age: 54 y.o.  Date: 09/19/2015  Chief Complaint  Patient presents with  . Follow-up  . Medication Refill    History of presenting illness. [name] came for her followup appointment.  She is compliant with her psychiatric medication.  Recently she has difficulty in her feet due to plantar fasciitis.  She has been unable to see specialist and she is frustrated.  She was given still avoids which helps some but she wants to continue to follow up with specialist.  Overall she described her mood has been stable since she is taking her medication.  She denies any irritability , anger, hallucination or any paranoia.  She continues to have family issues.  Recently she find out that her brother is in jail due to drug charges.  She has not seen her brother in a while.  She has not talked to her mother and she is able to keep a distance from her.  Her husband is very supportive.  Patient reported no side effects with psychiatric medication.  She sleeping good.  She denies suicidal thoughts or homicidal thoughts.  She denies any crying spells.  Her appetite is okay.  Her vitals are stable.  She is seeing [name] for counseling.  Past psychiatric history. Patient has history of bipolar disorder with multiple psychiatric inpatient treatment.  Her last admission was September 2014 for suicidal thinking and hallucination.  She was playing with the guns and superficially cut her wrist with a needle.  She has history of mania and psychosis .  In the past she had tried Zyprexa Seroquel however she is very resistant and reluctant to take any medication that can cause weight gain.  She was tried tried Tegretol however she developed increased paranoia confusion and she was required hospitalization at behavioral [place].    Allergies: Allergies  Allergen Reactions  . Gabapentin Anaphylaxis  . Oxcarbazepine Anaphylaxis  .  Tegretol [Carbamazepine] Nausea And Vomiting  . Codeine Nausea Only  . Divalproex Sodium Swelling  . Erythromycin Nausea And Vomiting  . Phenytoin Sodium Extended Swelling   Medical History: Patient has history of brain surgery, abdominal hysterectomy, surgery for incontinence and knee surgery.  She has history of hypertension however recently help her pressure is stable.   Her primary care physician is Dr. [name].    [name] of Systems  Constitutional: Negative.   Gastrointestinal: Negative for nausea.  Musculoskeletal: Positive for joint pain.       Foot pain  Skin: Negative for itching and rash.  Neurological: Negative for dizziness and tremors.    Vital Signs:BP 132/78 mmHg  Pulse 80  Ht 5\' 7"  (1.702 m)  Wt 189 lb (85.73 kg)  BMI 29.59 kg/m2   Lab Results:  Results for orders placed or performed in visit on 03/02/15 (from the past 8736 hour(s))  Hemoglobin A1c   Collection Time: 03/16/15  9:06 AM  Result Value Ref Range   Hgb A1c MFr Bld 4.8 <5.7 %   Mean Plasma Glucose 91 <117 mg/dL  Lithium level   Collection Time: 03/16/15  9:06 AM  Result Value Ref Range   Lithium Lvl 1.30 0.80 - 1.40 mEq/L   Mental status examination Patient is casually dressed and well groomed.  She is complaining of foot pain and has difficulty walking.  She is cooperative and maintained good eye contact.  She denies any active or passive suicidal thoughts homicidal thoughts.  She described  her mood euthymic and her affect is appropriate.  She denies any auditory or visual hallucination.  Her speech is clear and coherent.  She denies any tremors or any shakes.  There were no paranoia or delusions present at this time.  Her attention concentration is good.  She's alert and oriented x3.  Her insight judgment and impulse control is okay.  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  She has no side effects from psychiatric medication.  I will continue Vistaril 25 mg 1 capsule as needed for insomnia and anxiety, lithium 1800 mg a day, Wellbutrin XL 300 mg daily, Klonopin 1 mg 2 tablet at bedtime, Risperdal 2 mg at bedtime and Topamax 50 mg at bedtime.  Encouraged to keep appointment with [name] for counseling.  Recommended to call [name] back if she has any question, concern or if she feels worsening of the symptoms.  I will see her again in 3 months.    [name] T., MD

## 2015-09-27 ENCOUNTER — Encounter: Payer: Self-pay | Admitting: Podiatry

## 2015-09-27 ENCOUNTER — Ambulatory Visit (INDEPENDENT_AMBULATORY_CARE_PROVIDER_SITE_OTHER): Payer: Medicare Other | Admitting: Podiatry

## 2015-09-27 ENCOUNTER — Ambulatory Visit (INDEPENDENT_AMBULATORY_CARE_PROVIDER_SITE_OTHER): Payer: Medicare Other

## 2015-09-27 VITALS — BP 130/97 | HR 82 | Resp 16 | Ht 63.0 in | Wt 184.0 lb

## 2015-09-27 DIAGNOSIS — M722 Plantar fascial fibromatosis: Secondary | ICD-10-CM

## 2015-09-27 MED ORDER — DICLOFENAC SODIUM 75 MG PO TBEC: 75.0000 mg | DELAYED_RELEASE_TABLET | Freq: Two times a day (BID) | ORAL | Status: DC

## 2015-09-27 MED ORDER — TRIAMCINOLONE ACETONIDE 10 MG/ML IJ SUSP
10.0000 mg | Freq: Once | INTRAMUSCULAR | Status: AC
Start: 2015-09-27 — End: 2015-09-27
  Administered 2015-09-27: 10 mg

## 2015-09-27 NOTE — Progress Notes (Signed)
   Subjective:    Patient ID: [name], female    DOB: 12-29-1960, 54 y.o.   MRN: [id number]  HPI Patient presents with bilateral foot pain, heel. Pt stated, "more painful in their left foot". This has been going since Aug. 16, 2016.  Review of Systems  Constitutional: Positive for unexpected weight change.  Musculoskeletal: Positive for gait problem.  Psychiatric/Behavioral: Positive for behavioral problems.  All other systems reviewed and are negative.      Objective:   Physical Exam        Assessment & Plan:

## 2015-09-27 NOTE — Progress Notes (Signed)
Subjective:     Patient ID: [name], female   DOB: [birth year], 54 y.o.   MRN: [id number]  HPI patient presents stating I have had pain in both of my heels that has been going on for about 6 weeks. I have had in the past but it's gotten worse recently and I did try to increase my exercise   Review of Systems  All other systems reviewed and are negative.      Objective:   Physical Exam  Constitutional: She is oriented to person, place, and time.  Cardiovascular: Intact distal pulses.   Musculoskeletal: Normal range of motion.  Neurological: She is oriented to person, place, and time.  Skin: Skin is warm.  Nursing note and vitals reviewed.  neurovascular status intact muscle strength adequate range of motion within normal limits with mild equinus noted bilateral. Patient's noted to have exquisite discomfort plantar aspect heel left over right with inflammation fluid around the medial band and moderate depression of the arch bilateral. Patient is well oriented and has good digital perfusion     Assessment:     Acute plantar fasciitis left over right heel with fluid buildup in the medial band    Plan:     H&P x-rays reviewed and injected the plantar fascia bilateral 3 mg Kenalog 5 mg Xylocaine and instructed on physical therapy and inflammatory supportive shoe gear usage and dispensed fascial brace for both foot. Patient will be seen back to recheck in one to 2 weeks

## 2015-09-27 NOTE — Patient Instructions (Signed)

## 2015-09-28 ENCOUNTER — Telehealth: Payer: Self-pay | Admitting: *Deleted

## 2015-09-28 NOTE — Telephone Encounter (Addendum)
[name] [name] states pt was given a partial Voltaren 100mg , but Dr. Paulla Dolly ordered Voltaren 75mg .  [name] states their pharmacy has contacted pt and she denies any untoward effects.  I spoke with pt and she says she's fine and on her way to get the correct Voltaren.

## 2015-10-05 ENCOUNTER — Encounter: Payer: Self-pay | Admitting: Podiatry

## 2015-10-05 ENCOUNTER — Ambulatory Visit (INDEPENDENT_AMBULATORY_CARE_PROVIDER_SITE_OTHER): Payer: Medicare Other | Admitting: Podiatry

## 2015-10-05 DIAGNOSIS — M722 Plantar fascial fibromatosis: Secondary | ICD-10-CM

## 2015-10-05 MED ORDER — TRIAMCINOLONE ACETONIDE 10 MG/ML IJ SUSP
10.0000 mg | Freq: Once | INTRAMUSCULAR | Status: AC
  Administered 2015-10-05: 10 mg

## 2015-10-06 NOTE — Progress Notes (Signed)
Subjective:     Patient ID: [name], female   DOB: 1961-09-27, 54 y.o.   MRN: [id number]  HPI patient states I'm still having quite a bit of discomfort in the left heel that I have had improvement in my right one is definitely better than it was.   Review of Systems     Objective:   Physical Exam Neurovascular status intact muscle strength adequate range of motion within normal limits with patient noted to have continued discomfort in the plantar heel left over right with inflammation and fluid around the medial band    Assessment:     Plantar fasciitis left over right heel with fluid buildup noted and depression of the arch    Plan:     Reviewed condition with family at great length and recommended long-term orthotics and scanned for custom orthotic devices. Reinjected the plantar fascial left 3 mg Kenalog 5 mg Xylocaine and instructed on physical therapy

## 2015-10-26 ENCOUNTER — Telehealth: Payer: Self-pay | Admitting: *Deleted

## 2015-10-26 NOTE — Telephone Encounter (Signed)
Pt states she has an appt tomorrow to pick up the orthotics, but her left arch hurts and she was wondering if she need to see the doctor.  I told pt if she felt she need to see a doctor, we would be able to work her in, and that the orthotic assistant could advise the doctor, but pt did have the choice to take the orthotics and see if they helped the problem, because they were part of the therapy.  I told pt we routinely instructed pt to wear the orthotics for a month, then make an appt after 1 month, but if problems occurred or persisted to make an appt sooner.  Pt states understanding.

## 2015-10-27 ENCOUNTER — Ambulatory Visit: Payer: Medicare Other | Admitting: *Deleted

## 2015-10-27 DIAGNOSIS — M722 Plantar fascial fibromatosis: Secondary | ICD-10-CM

## 2015-10-27 NOTE — Progress Notes (Signed)
Patient ID: [name], female   DOB: 06-02-1961, 54 y.o.   MRN: [id number] Patient presents for orthotic pick up.  Verbal and written break in and wear instructions given.  Patient will follow up in 4 weeks if symptoms worsen or fail to improve.

## 2015-10-27 NOTE — Patient Instructions (Signed)

## 2015-11-24 ENCOUNTER — Ambulatory Visit (INDEPENDENT_AMBULATORY_CARE_PROVIDER_SITE_OTHER): Payer: Medicare Other | Admitting: Podiatry

## 2015-11-24 ENCOUNTER — Encounter: Payer: Self-pay | Admitting: Podiatry

## 2015-11-24 VITALS — BP 130/94 | HR 82 | Resp 16

## 2015-11-24 DIAGNOSIS — M722 Plantar fascial fibromatosis: Secondary | ICD-10-CM | POA: Diagnosis not present

## 2015-11-24 DIAGNOSIS — M779 Enthesopathy, unspecified: Secondary | ICD-10-CM | POA: Diagnosis not present

## 2015-11-24 MED ORDER — PREDNISONE 10 MG PO TABS
ORAL_TABLET | ORAL | Status: DC
Start: 2015-11-24 — End: 2016-01-24

## 2015-11-27 NOTE — Progress Notes (Signed)
Subjective:     Patient ID: [name], female   DOB: 04-07-1961, 54 y.o.   MRN: [id number]  HPI patient states I seem to be improved with moderate discomfort if I been on it all the time but better than it was   Review of Systems     Objective:   Physical Exam Neurovascular status intact muscle strength adequate with discomfort in the arch heel that's improved but still present with deep palpation    Assessment:     Improved fasciitis-like condition    Plan:     Advised on physical therapy and anti-inflammatories and supportive shoe gear. Be seen back as needed

## 2015-12-26 ENCOUNTER — Ambulatory Visit (INDEPENDENT_AMBULATORY_CARE_PROVIDER_SITE_OTHER): Payer: Medicare PPO | Admitting: Psychiatry

## 2015-12-26 ENCOUNTER — Encounter (HOSPITAL_COMMUNITY): Payer: Self-pay | Admitting: Psychiatry

## 2015-12-26 VITALS — BP 127/89 | HR 96 | Resp 14 | Ht 63.0 in | Wt 183.2 lb

## 2015-12-26 DIAGNOSIS — F29 Unspecified psychosis not due to a substance or known physiological condition: Secondary | ICD-10-CM | POA: Diagnosis not present

## 2015-12-26 DIAGNOSIS — F319 Bipolar disorder, unspecified: Secondary | ICD-10-CM

## 2015-12-26 MED ORDER — CLONAZEPAM 1 MG PO TABS: ORAL_TABLET | ORAL | Status: DC

## 2015-12-26 MED ORDER — HYDROXYZINE PAMOATE 25 MG PO CAPS: 25.0000 mg | ORAL_CAPSULE | Freq: Every day | ORAL | Status: DC | PRN

## 2015-12-26 MED ORDER — RISPERIDONE 2 MG PO TABS: ORAL_TABLET | ORAL | Status: DC

## 2015-12-26 MED ORDER — TOPIRAMATE 50 MG PO TABS: 50.0000 mg | ORAL_TABLET | Freq: Every day | ORAL | Status: DC

## 2015-12-26 MED ORDER — LITHIUM CARBONATE 600 MG PO CAPS: 600.0000 mg | ORAL_CAPSULE | Freq: Three times a day (TID) | ORAL | Status: DC

## 2015-12-26 MED ORDER — BUPROPION HCL ER (XL) 300 MG PO TB24: ORAL_TABLET | ORAL | Status: DC

## 2015-12-26 NOTE — Progress Notes (Signed)
[place] Progress Note  SA FEHRMAN [id number] 55 y.o.  12/26/2015 3:25 PM  Chief Complaint:  Medication management and follow-up.  History of Present Illness:  [name] came for her follow-up appointment.  She is taking her medication as prescribed.  She is seeing podiatrist for her plantar fasciitis and she is feeling much better.  She described her mood has been stable.  She has not seen her mother in past 10 months.  She endorse her Christmas was quite and she really enjoyed spending time with her immediate family.  She do not regret about her mother as she has not seen her in past 10 months.  She is keeping a distance from her.  Recently she find out that she is getting remarried and that disappoint her.  She sleeping good.  She denies any irritability, paranoia or any hallucination.  Sometimes she do get overwhelmed and having racing thoughts but she feels medicine is working very well.  She denies any crying spells.  Her appetite is okay.  Her vitals are stable.  She denies any mania or any psychosis.  She denies drinking or using any illegal substances.  Suicidal Ideation: No Plan Formed: No Patient has means to carry out plan: No  Homicidal Ideation: No Plan Formed: No Patient has means to carry out plan: No  Medical History; Patient has history of brain surgery, abdominal hysterectomy, surgery for incontinence and knee surgery. She has history of hypertension however recently help her pressure is stable.  Her primary care physician is Dr. [name].   Past Psychiatric History/Hospitalization(s) Patient has history of bipolar disorder with multiple psychiatric inpatient treatment. Her last admission was September 2014 for suicidal thinking and hallucination. She was playing with the guns and superficially cut her wrist with a needle. She has history of mania and psychosis . In the past she had tried Zyprexa Seroquel however she is very resistant and reluctant to  take any medication that can cause weight gain. She was tried tried Tegretol however she developed increased paranoia confusion and she was required hospitalization at behavioral [place].  Anxiety: Yes Bipolar Disorder: Yes Depression: Yes Mania: Yes Psychosis: Yes Schizophrenia: No Personality Disorder: No Hospitalization for psychiatric illness: Yes History of Electroconvulsive Shock Therapy: No Prior Suicide Attempts: No   Review of Systems: Psychiatric: Agitation: No Hallucination: No Depressed Mood: No Insomnia: No Hypersomnia: No Altered Concentration: No Feels Worthless: No Grandiose Ideas: No Belief In Special Powers: No New/Increased Substance Abuse: No Compulsions: No  Neurologic: Headache: No Seizure: No Paresthesias: No  Outpatient Encounter Prescriptions as of 12/26/2015  Medication Sig  . buPROPion (WELLBUTRIN XL) 300 MG 24 hr tablet TAKE ONE TABLET BY MOUTH EVERY MORINING FOR DEPRESSION  . clonazePAM (KLONOPIN) 1 MG tablet TAKE ONE -TWO TABLETS AT BEDTIME  . diclofenac (VOLTAREN) 75 MG EC tablet Take 1 tablet (75 mg total) by mouth 2 (two) times daily.  . hydrOXYzine (VISTARIL) 25 MG capsule Take 1 capsule (25 mg total) by mouth daily as needed for anxiety.  Marland Kitchen lithium 600 MG capsule Take 1 capsule (600 mg total) by mouth 3 (three) times daily with meals.  . predniSONE (DELTASONE) 10 MG tablet 12 day tapering dose  . risperiDONE (RISPERDAL) 2 MG tablet TAKE ONE TABLET BY MOUTH AT BEDTIME AS NEEDED FOR MOOD CONTROL  . topiramate (TOPAMAX) 50 MG tablet Take 1 tablet (50 mg total) by mouth daily.  . [DISCONTINUED] buPROPion (WELLBUTRIN XL) 300 MG 24 hr tablet TAKE ONE TABLET  BY MOUTH EVERY MORINING FOR DEPRESSION  . [DISCONTINUED] clonazePAM (KLONOPIN) 1 MG tablet TAKE ONE -TWO TABLETS AT BEDTIME  . [DISCONTINUED] hydrOXYzine (VISTARIL) 25 MG capsule Take 1 capsule (25 mg total) by mouth daily as needed for anxiety.  . [DISCONTINUED] lithium 600 MG  capsule Take 1 capsule (600 mg total) by mouth 3 (three) times daily with meals.  . [DISCONTINUED] risperiDONE (RISPERDAL) 2 MG tablet TAKE ONE TABLET BY MOUTH AT BEDTIME AS NEEDED FOR MOOD CONTROL  . [DISCONTINUED] topiramate (TOPAMAX) 50 MG tablet Take 1 tablet (50 mg total) by mouth daily.   No facility-administered encounter medications on file as of 12/26/2015.    No results found for this or any previous visit (from the past 2160 hour(s)).  Physical Exam: Consitutional ;  BP 127/89 mmHg  Pulse 96  Resp 14  Ht 5\' 3"  (1.6 m)  Wt 183 lb 3.2 oz (83.099 kg)  BMI 32.46 kg/m2  Musculoskeletal: Strength & Muscle Tone: within normal limits Gait & Station: normal Patient leans: N/A   Psychiatric Specialty Exam: General Appearance: Casual  Eye Contact::  Good  Speech:  Normal Rate  Volume:  Normal  Mood:  Euthymic  Affect:  Congruent  Thought Process:  Coherent  Orientation:  Full (Time, Place, and Person)  Thought Content:  WDL  Suicidal Thoughts:  No  Homicidal Thoughts:  No  Memory:  Immediate;   Fair Recent;   Fair Remote;   Good  Judgement:  Fair  Insight:  Good  Psychomotor Activity:  Normal  Concentration:  Fair  Recall:  [employer] of Knowledge:  Fair  Language:  Fair  Akathisia:  No  Handed:  Right  AIMS (if indicated):     Assets:  Communication Skills Desire for Improvement Financial Resources/Insurance Housing  ADL's:  Intact  Cognition:  WNL  Sleep:        Established Problem, Stable/Improving (1), Review of Psycho-Social Stressors (1), Review of Last Therapy Session (1) and Review of Medication Regimen & Side Effects (2)  Assessment: Bipolar disorder with psychotic features, anxiety disorder NOS  Axis III:  Past Medical History  Diagnosis Date  . HTN (hypertension)   . Mania ([place])   . Bipolar disorder ([place])   . Anxiety   . Depression     Plan:  Patient is a stable on her current psychiatric medication.  She has no tremors, shakes or  any EPS.  Discuss psychosocial stressors.  He is not interested in counseling at this time.  I will continue Vistaril 25 mg as needed for severe anxiety, Risperdal 2 mg at bedtime, Topamax 50 mg at bedtime, lithium 600 mg 3 times a day and Klonopin 1 mg twice a day.  Recommended to call us back if she has any question or any concern.  We will do lithium level on her next appointment.  Discuss safety plan that anytime having active suicidal thoughts or homicidal thoughts and she need to call 911 or go to local emergency room.  Lakelynn Severtson T., MD 12/26/2015

## 2016-01-24 ENCOUNTER — Ambulatory Visit (INDEPENDENT_AMBULATORY_CARE_PROVIDER_SITE_OTHER): Payer: Medicare PPO | Admitting: Podiatry

## 2016-01-24 ENCOUNTER — Encounter: Payer: Self-pay | Admitting: Podiatry

## 2016-01-24 VITALS — BP 144/97 | HR 82 | Resp 16

## 2016-01-24 DIAGNOSIS — M722 Plantar fascial fibromatosis: Secondary | ICD-10-CM

## 2016-01-24 MED ORDER — TRIAMCINOLONE ACETONIDE 10 MG/ML IJ SUSP
10.0000 mg | Freq: Once | INTRAMUSCULAR | Status: AC
  Administered 2016-01-24: 10 mg

## 2016-01-24 NOTE — Progress Notes (Signed)
Subjective:     Patient ID: [name], female   DOB: 02-12-1961, 55 y.o.   MRN: [id number]  HPI patient states my left heel is still hurting quite a bit with mild pain on my right. Patient states that it seems to be worse after sitting and in the morning   Review of Systems     Objective:   Physical Exam  neurovascular status intact with intense discomfort in the plantar fascial left at the insertional point tendon into the calcaneus with fluid buildup noted around the medial band    Assessment:      continued acute plantar fasciitis left worse after periods of sitting and when getting up in the morning    Plan:      reinjected the plantar fascial left 3 mg Kenalog 5 mg Xylocaine and dispensed night splint with all instructions on usage at this time

## 2016-02-14 ENCOUNTER — Encounter: Payer: Self-pay | Admitting: Podiatry

## 2016-02-14 ENCOUNTER — Ambulatory Visit (INDEPENDENT_AMBULATORY_CARE_PROVIDER_SITE_OTHER): Payer: Medicare PPO | Admitting: Podiatry

## 2016-02-14 VITALS — BP 133/99 | HR 71 | Resp 16

## 2016-02-14 DIAGNOSIS — M722 Plantar fascial fibromatosis: Secondary | ICD-10-CM | POA: Diagnosis not present

## 2016-02-14 NOTE — Patient Instructions (Signed)
Pre-Operative Instructions  Congratulations, you have decided to take an important step to improving your quality of life.  You can be assured that the doctors of [facility] will be with you every step of the way.  1. Plan to be at the surgery center/hospital at least 1 (one) hour prior to your scheduled time unless otherwise directed by the surgical center/hospital staff.  You must have a responsible adult accompany you, remain during the surgery and drive you home.  Make sure you have directions to the surgical center/hospital and know how to get there on time. 2. For hospital based surgery you will need to obtain a history and physical form from your family physician within 1 month prior to the date of surgery- we will give you a form for you primary physician.  3. We make every effort to accommodate the date you request for surgery.  There are however, times where surgery dates or times have to be moved.  We will contact you as soon as possible if a change in schedule is required.   4. No Aspirin/Ibuprofen for one week before surgery.  If you are on aspirin, any non-steroidal anti-inflammatory medications (Mobic, Aleve, Ibuprofen) you should stop taking it 7 days prior to your surgery.  You make take Tylenol  For pain prior to surgery.  5. Medications- If you are taking daily heart and blood pressure medications, seizure, reflux, allergy, asthma, anxiety, pain or diabetes medications, make sure the surgery center/hospital is aware before the day of surgery so they may notify you which medications to take or avoid the day of surgery. 6. No food or drink after midnight the night before surgery unless directed otherwise by surgical center/hospital staff. 7. No alcoholic beverages 24 hours prior to surgery.  No smoking 24 hours prior to or 24 hours after surgery. 8. Wear loose pants or shorts- loose enough to fit over bandages, boots, and casts. 9. No slip on shoes, sneakers are best. 10. Bring  your boot with you to the surgery center/hospital.  Also bring crutches or a walker if your physician has prescribed it for you.  If you do not have this equipment, it will be provided for you after surgery. 11. If you have not been contracted by the surgery center/hospital by the day before your surgery, call to confirm the date and time of your surgery. 12. Leave-time from work may vary depending on the type of surgery you have.  Appropriate arrangements should be made prior to surgery with your employer. 13. Prescriptions will be provided immediately following surgery by your doctor.  Have these filled as soon as possible after surgery and take the medication as directed. 14. Remove nail polish on the operative foot. 15. Wash the night before surgery.  The night before surgery wash the foot and leg well with the antibacterial soap provided and water paying special attention to beneath the toenails and in between the toes.  Rinse thoroughly with water and dry well with a towel.  Perform this wash unless told not to do so by your physician.  Enclosed: 1 Ice pack (please put in freezer the night before surgery)   1 Hibiclens skin cleaner   Pre-op Instructions  If you have any questions regarding the instructions, do not hesitate to call our office.  [place]: [address] [place], [place] [address] [phone]  [place]: [address] [place], [place] [address] [phone]  [place]: [address]  [place], [place] [address] [phone]  Dr. Richard   Tuchman DPM, Dr. Gilad Dugger DPM Dr. Richard Sikora DPM, Dr. M. Todd Hyatt DPM, Dr. Kathryn Egerton DPM 

## 2016-02-15 ENCOUNTER — Telehealth: Payer: Self-pay | Admitting: *Deleted

## 2016-02-15 NOTE — Progress Notes (Signed)
Subjective:     Patient ID: [name], female   DOB: 04-08-1961, 55 y.o.   MRN: [id number]  HPI patient states I am still having a lot of pain in my left heel. It does better for periods of time but anytime I try to exercise it gets really bad and it seems like it's just not allowing me to do what I want to do. It's been present for at least a year and I have tried numerous conservative treatments including injection treatment inserts shoe gear modifications night splint ice and reduced activity   Review of Systems     Objective:   Physical Exam Neurovascular status intact muscle strength adequate range of motion within normal limits with patient noted to have exquisite discomfort left plantar fascia of 1 year duration.    Assessment:     Chronic plantar fasciitis left which is failed to respond to numerous conservative treatments    Plan:     H&P condition reviewed with patient and husband and we discussed treatment options. Due to the inability of patient to be active of the chronic pain present patient is opted for surgical intervention I've recommended endoscopic procedure. I allowed patient to read consent form going over all possible complications as outlined in the consent form and the fact there is no long-term guarantees as far as success. Patient wants procedure understanding complications and signs consent form at the current time air fracture walker dispensed with all instructions on usage and I did explain that recovery can take 6 months to one year  X-ray report indicated spur formation depression of the arch with no signs of stress fracture

## 2016-02-15 NOTE — Telephone Encounter (Signed)
"  I'm scheduled to have surgery at [facility].  I need directions on how to get there."  Did you receive a brochure from the surgical center?  "I did but it doesn't help me.  It would be better if you could tell me.  I'm coming from [place], [place].  Is it close to your office?"  Yes, if you are on [facility], you will turn right onto [address].  The address is [address].  "Are they on the right side or the left side?"  They are on the right side of the street.  "That helps me a lot, thank you."

## 2016-02-19 ENCOUNTER — Telehealth: Payer: Self-pay | Admitting: *Deleted

## 2016-02-19 NOTE — Telephone Encounter (Signed)
"  I'm scheduled for surgery at [facility].  I need to get directions from your office to there so I can write them down.  I'm coming from [place]."  I remember you I spoke to you the other day.  "Yes, you gave me directions before but I didn't write it down.  I don't want to get there late so I'm calling again."  Come up [facility] from [address].  Keep straight ahead you will pass our street, [address].  Turn right on [address]  Continue on [place] [place], you will cross over [place].  Surgical Center will be on the right side.  Address is [address].  "Okay, thank you."

## 2016-02-23 ENCOUNTER — Telehealth: Payer: Self-pay | Admitting: *Deleted

## 2016-02-23 NOTE — Telephone Encounter (Signed)
I was talking to my insurance company.  I have a new insurance.  They said no one has contacted them about my surgery.  They wanted me to find out when someone was going to call me.  I've got [employer]."  I actually have it on my desk to do today.  I'm getting ready to work on it.  "Okay, I just wanted to make sure with this new insurance."

## 2016-03-05 DIAGNOSIS — M722 Plantar fascial fibromatosis: Secondary | ICD-10-CM | POA: Diagnosis not present

## 2016-03-05 HISTORY — PX: PLANTAR FASCIECTOMY: SUR600

## 2016-03-07 ENCOUNTER — Telehealth: Payer: Self-pay | Admitting: *Deleted

## 2016-03-07 NOTE — Telephone Encounter (Signed)
Pt called left name, DOS.  Pt asked if she was to put pressure on the left surgery foot when she walked.  I told pt the cam boot has a hard rocker shaped sole that when she put it down it would gently roll her foot forward, and she could quickly bring the right foot up to it when stepping, but should only be up on the foot 5 minutes / hour, to walk in the boot and sleep in the boot, but could take it off to ice and rest.  Informed pt she could use a cane to help balance when stepping. Pt states understanding.

## 2016-03-14 ENCOUNTER — Ambulatory Visit (INDEPENDENT_AMBULATORY_CARE_PROVIDER_SITE_OTHER): Payer: Medicare PPO | Admitting: Podiatry

## 2016-03-14 DIAGNOSIS — Z9889 Other specified postprocedural states: Secondary | ICD-10-CM | POA: Diagnosis not present

## 2016-03-14 DIAGNOSIS — M722 Plantar fascial fibromatosis: Secondary | ICD-10-CM | POA: Diagnosis not present

## 2016-03-14 NOTE — Progress Notes (Signed)
Subjective:     Patient ID: [name], female   DOB: [birth year], 55 y.o.   MRN: [id number]  HPI patient presents stating I'm doing very well with my foot with minimal discomfort and stitches intact   Review of Systems     Objective:   Physical Exam Neurovascular status intact negative Homan sign was noted with well-healing surgical site medial lateral  plantar heel    Assessment:     Doing well post endoscopic procedure left    Plan:     Sterile dressing reapplied and dispensed surgical shoe and Ace wrap. Instructed what to do and to continue to elevate and patient be seen back 2 weeks suture removal earlier if any issues should occur

## 2016-03-27 ENCOUNTER — Ambulatory Visit (INDEPENDENT_AMBULATORY_CARE_PROVIDER_SITE_OTHER): Payer: Medicare PPO | Admitting: Psychiatry

## 2016-03-27 ENCOUNTER — Encounter (HOSPITAL_COMMUNITY): Payer: Self-pay | Admitting: Psychiatry

## 2016-03-27 VITALS — BP 128/80 | HR 97 | Ht 63.0 in | Wt 183.6 lb

## 2016-03-27 DIAGNOSIS — Z79899 Other long term (current) drug therapy: Secondary | ICD-10-CM

## 2016-03-27 DIAGNOSIS — F319 Bipolar disorder, unspecified: Secondary | ICD-10-CM

## 2016-03-27 DIAGNOSIS — F419 Anxiety disorder, unspecified: Secondary | ICD-10-CM | POA: Diagnosis not present

## 2016-03-27 MED ORDER — HYDROXYZINE PAMOATE 25 MG PO CAPS: 25.0000 mg | ORAL_CAPSULE | Freq: Every day | ORAL | Status: DC | PRN

## 2016-03-27 MED ORDER — CLONAZEPAM 1 MG PO TABS: ORAL_TABLET | ORAL | Status: DC

## 2016-03-27 MED ORDER — LITHIUM CARBONATE 600 MG PO CAPS: 600.0000 mg | ORAL_CAPSULE | Freq: Three times a day (TID) | ORAL | Status: DC

## 2016-03-27 MED ORDER — BUPROPION HCL ER (XL) 300 MG PO TB24: ORAL_TABLET | ORAL | Status: DC

## 2016-03-27 MED ORDER — TOPIRAMATE 50 MG PO TABS: 50.0000 mg | ORAL_TABLET | Freq: Every day | ORAL | Status: DC

## 2016-03-27 MED ORDER — RISPERIDONE 2 MG PO TABS: ORAL_TABLET | ORAL | Status: DC

## 2016-03-27 NOTE — Progress Notes (Signed)
[facility] Progress Note  [name] [id number] 55 y.o.  03/27/2016 2:15 PM  Chief Complaint:  I have been a lot of pain .  I have for surgery for plantar fascitis.  I am wearing boot.   History of Present Illness:  [name] came for her follow-up appointment.  She is complaining of foot pain .  Recently she had surgery for plantar fasciitis and she is wearing boot on her ankle.  She is hoping to come off from her boot this Friday.  Other than pain she is doing better.  She admitted some time poor sleep.  She was given narcotic pain medication but she started to have hallucination.  She is no longer taking narcotic pain medication.  She described her mood is a stable.  She is taking her psychiatric medication and denies any irritability, anger, paranoia or any mood swings.  She endorse her husband has been very supportive and she is glad that her mother did not call and did not cause any more issues.  Patient denies drinking or using any illegal substances.  She has some restriction on mobility due to recent surgery but overall she described her energy level is good.  Her appetite is okay.  Her vitals are stable.  Suicidal Ideation: No Plan Formed: No Patient has means to carry out plan: No  Homicidal Ideation: No Plan Formed: No Patient has means to carry out plan: No  Medical History; Patient has history of brain surgery and abdominal hysterectomy.  She also had surgery for urinary incontinence, plantar fasciitis and knee surgery.  Patient has hypertension which is controlled by diet. Her primary care physician is Dr. [name].   Past Psychiatric History/Hospitalization(s) Patient has history of bipolar disorder with multiple psychiatric inpatient treatment. Her last admission was September 2014 for suicidal thinking and hallucination. She was playing with the guns and superficially cut her wrist with a needle. She has history of mania and psychosis . In the past she had  tried Zyprexa Seroquel however she is very resistant and reluctant to take any medication that can cause weight gain. She was tried tried Tegretol however she developed increased paranoia confusion and she was required hospitalization at behavioral [place].  Anxiety: Yes Bipolar Disorder: Yes Depression: Yes Mania: Yes Psychosis: Yes Schizophrenia: No Personality Disorder: No Hospitalization for psychiatric illness: Yes History of Electroconvulsive Shock Therapy: No Prior Suicide Attempts: No   Review of Systems: Psychiatric: Agitation: No Hallucination: No Depressed Mood: No Insomnia: No Hypersomnia: No Altered Concentration: No Feels Worthless: No Grandiose Ideas: No Belief In Special Powers: No New/Increased Substance Abuse: No Compulsions: No  Neurologic: Headache: No Seizure: No Paresthesias: No  Outpatient Encounter Prescriptions as of 03/27/2016  Medication Sig  . buPROPion (WELLBUTRIN XL) 300 MG 24 hr tablet TAKE ONE TABLET BY MOUTH EVERY MORINING FOR DEPRESSION  . clonazePAM (KLONOPIN) 1 MG tablet TAKE ONE -TWO TABLETS AT BEDTIME  . diclofenac (VOLTAREN) 75 MG EC tablet Take 1 tablet (75 mg total) by mouth 2 (two) times daily.  . hydrOXYzine (VISTARIL) 25 MG capsule Take 1 capsule (25 mg total) by mouth daily as needed for anxiety.  Marland Kitchen lithium 600 MG capsule Take 1 capsule (600 mg total) by mouth 3 (three) times daily with meals.  . risperiDONE (RISPERDAL) 2 MG tablet TAKE ONE TABLET BY MOUTH AT BEDTIME AS NEEDED FOR MOOD CONTROL  . topiramate (TOPAMAX) 50 MG tablet Take 1 tablet (50 mg total) by mouth daily.  . [DISCONTINUED]  buPROPion (WELLBUTRIN XL) 300 MG 24 hr tablet TAKE ONE TABLET BY MOUTH EVERY MORINING FOR DEPRESSION  . [DISCONTINUED] clonazePAM (KLONOPIN) 1 MG tablet TAKE ONE -TWO TABLETS AT BEDTIME  . [DISCONTINUED] hydrOXYzine (VISTARIL) 25 MG capsule Take 1 capsule (25 mg total) by mouth daily as needed for anxiety.  . [DISCONTINUED] lithium 600  MG capsule Take 1 capsule (600 mg total) by mouth 3 (three) times daily with meals.  . [DISCONTINUED] risperiDONE (RISPERDAL) 2 MG tablet TAKE ONE TABLET BY MOUTH AT BEDTIME AS NEEDED FOR MOOD CONTROL  . [DISCONTINUED] topiramate (TOPAMAX) 50 MG tablet Take 1 tablet (50 mg total) by mouth daily.   No facility-administered encounter medications on file as of 03/27/2016.    No results found for this or any previous visit (from the past 2160 hour(s)).  Physical Exam: Consitutional ;  BP 128/80 mmHg  Pulse 97  Ht 5\' 3"  (1.6 m)  Wt 183 lb 9.6 oz (83.28 kg)  BMI 32.53 kg/m2  Musculoskeletal: Strength & Muscle Tone: within normal limits Gait & Station: normal, Difficulty walking due to boots in her ankle. Patient leans: N/A   Psychiatric Specialty Exam: General Appearance: Casual  Eye Contact::  Good  Speech:  Normal Rate  Volume:  Normal  Mood:  Euthymic  Affect:  Congruent  Thought Process:  Coherent  Orientation:  Full (Time, Place, and Person)  Thought Content:  WDL  Suicidal Thoughts:  No  Homicidal Thoughts:  No  Memory:  Immediate;   Fair Recent;   Fair Remote;   Good  Judgement:  Fair  Insight:  Good  Psychomotor Activity:  Normal  Concentration:  Fair  Recall:  [employer] of Knowledge:  Fair  Language:  Fair  Akathisia:  No  Handed:  Right  AIMS (if indicated):     Assets:  Communication Skills Desire for Improvement Financial Resources/Insurance Housing  ADL's:  Intact  Cognition:  WNL  Sleep:        Established Problem, Stable/Improving (1), Review of Psycho-Social Stressors (1), Review of Last Therapy Session (1) and Review of Medication Regimen & Side Effects (2)  Assessment: Bipolar disorder with psychotic features, anxiety disorder NOS  Axis III:  Past Medical History  Diagnosis Date  . HTN (hypertension)   . Mania ([place])   . Bipolar disorder ([place])   . Anxiety   . Depression     Plan:  Patient is a stable on her current psychiatric  medication.  She has no tremors, shakes or any EPS.  She wants to continue her current psychiatric medication.  I will continue Vistaril 25 mg as needed for severe anxiety, Risperdal 2 mg at bedtime, Topamax 50 mg at bedtime, lithium 600 mg 3 times a day and Klonopin 1 mg twice a day.  We will do blood work including lithium level.  Recommended to call us back if she has any question or any concern.  Discuss safety plan that anytime having active suicidal thoughts or homicidal thoughts and she need to call 911 or go to local emergency room.  Abdulrahim Siddiqi T., MD 03/27/2016

## 2016-03-29 ENCOUNTER — Encounter: Payer: Self-pay | Admitting: Podiatry

## 2016-03-29 ENCOUNTER — Ambulatory Visit (INDEPENDENT_AMBULATORY_CARE_PROVIDER_SITE_OTHER): Payer: Medicare PPO | Admitting: Podiatry

## 2016-03-29 VITALS — BP 128/98 | HR 78 | Temp 97.7°F | Resp 16

## 2016-03-29 DIAGNOSIS — M722 Plantar fascial fibromatosis: Secondary | ICD-10-CM

## 2016-03-29 DIAGNOSIS — Z9889 Other specified postprocedural states: Secondary | ICD-10-CM | POA: Diagnosis not present

## 2016-03-31 NOTE — Progress Notes (Signed)
Subjective:     Patient ID: [name], female   DOB: Nov 29, 1961, 55 y.o.   MRN: [id number]  HPI patient states she's doing well overall with her heel with mild discomfort and swelling   Review of Systems     Objective:   Physical Exam Neurovascular status intact muscle strength adequate with diminished discomfort posterior plantar heel left with stitches intact on the medial and lateral side    Assessment:     Doing well from fasciitis endoscopic surgery left    Plan:     Stitches removed wound edges coapted well and negative Homans sign noted. Applied ankle compression stocking instructed on continued boot shoe usage and gradual return soft shoes over the next several weeks. Reappoint to recheck as needed

## 2016-04-12 ENCOUNTER — Other Ambulatory Visit (HOSPITAL_COMMUNITY): Payer: Self-pay | Admitting: Psychiatry

## 2016-04-17 ENCOUNTER — Telehealth: Payer: Self-pay | Admitting: *Deleted

## 2016-04-17 NOTE — Telephone Encounter (Signed)
Pt asked how long after endoscopic plantar fasciotomy before she will be pain free.  I told pt all pts are different but generally speaking pt would have a noticeable improvement after 4-6 weeks, but may continue to have discomfort and swelling of varying degrees for 6-9 months.  Pt states she is having arch pain in the surgical foot, I instructed pt to wear the orthotic provided by our office in the surgical shoe as long as it is comfortable and the take it out when it isn't until she can wear it all day or if she would decrease her activity that may help too.

## 2016-05-24 LAB — CBC WITH DIFFERENTIAL/PLATELET
Basophils Absolute: 0 cells/uL (ref 0–200)
Basophils Relative: 0 %
Eosinophils Absolute: 140 cells/uL (ref 15–500)
Eosinophils Relative: 2 %
HCT: 43.2 % (ref 35.0–45.0)
Hemoglobin: 14.5 g/dL (ref 11.7–15.5)
Lymphocytes Relative: 20 %
Lymphs Abs: 1400 cells/uL (ref 850–3900)
MCH: 31.3 pg (ref 27.0–33.0)
MCHC: 33.6 g/dL (ref 32.0–36.0)
MCV: 93.3 fL (ref 80.0–100.0)
MPV: 10.3 fL (ref 7.5–12.5)
Monocytes Absolute: 560 cells/uL (ref 200–950)
Monocytes Relative: 8 %
Neutro Abs: 4900 cells/uL (ref 1500–7800)
Neutrophils Relative %: 70 %
Platelets: 268 10*3/uL (ref 140–400)
RBC: 4.63 MIL/uL (ref 3.80–5.10)
RDW: 13.2 % (ref 11.0–15.0)
WBC: 7 10*3/uL (ref 3.8–10.8)

## 2016-05-25 LAB — COMPLETE METABOLIC PANEL WITH GFR
ALT: 10 U/L (ref 6–29)
AST: 11 U/L (ref 10–35)
Albumin: 4.1 g/dL (ref 3.6–5.1)
Alkaline Phosphatase: 81 U/L (ref 33–130)
BUN: 11 mg/dL (ref 7–25)
CO2: 19 mmol/L — ABNORMAL LOW (ref 20–31)
Calcium: 9.9 mg/dL (ref 8.6–10.4)
Chloride: 110 mmol/L (ref 98–110)
Creat: 0.99 mg/dL (ref 0.50–1.05)
GFR, Est African American: 76 mL/min (ref >=60)
GFR, Est Non African American: 66 mL/min (ref >=60)
Glucose, Bld: 82 mg/dL (ref 65–99)
Potassium: 4 mmol/L (ref 3.5–5.3)
Sodium: 140 mmol/L (ref 135–146)
Total Bilirubin: 0.5 mg/dL (ref 0.2–1.2)
Total Protein: 6.3 g/dL (ref 6.1–8.1)

## 2016-05-25 LAB — HEMOGLOBIN A1C
Hgb A1c MFr Bld: 5 % (ref <5.7)
Mean Plasma Glucose: 97 mg/dL

## 2016-05-25 LAB — TSH: TSH: 1.79 mIU/L

## 2016-05-25 LAB — LITHIUM LEVEL: Lithium Lvl: 0.7 mEq/L — ABNORMAL LOW (ref 0.80–1.40)

## 2016-06-13 ENCOUNTER — Ambulatory Visit (INDEPENDENT_AMBULATORY_CARE_PROVIDER_SITE_OTHER): Payer: Medicare PPO | Admitting: Podiatry

## 2016-06-13 ENCOUNTER — Encounter: Payer: Self-pay | Admitting: Podiatry

## 2016-06-13 ENCOUNTER — Ambulatory Visit (INDEPENDENT_AMBULATORY_CARE_PROVIDER_SITE_OTHER): Payer: Medicare PPO

## 2016-06-13 DIAGNOSIS — G8918 Other acute postprocedural pain: Secondary | ICD-10-CM | POA: Diagnosis not present

## 2016-06-13 DIAGNOSIS — M722 Plantar fascial fibromatosis: Secondary | ICD-10-CM | POA: Diagnosis not present

## 2016-06-13 MED ORDER — TRAMADOL HCL 50 MG PO TABS: 50.0000 mg | ORAL_TABLET | Freq: Three times a day (TID) | ORAL | Status: DC

## 2016-06-13 MED ORDER — TRIAMCINOLONE ACETONIDE 10 MG/ML IJ SUSP
10.0000 mg | Freq: Once | INTRAMUSCULAR | Status: AC
  Administered 2016-06-13: 10 mg

## 2016-06-14 NOTE — Progress Notes (Signed)
Subjective:     Patient ID: [name], female   DOB: 1961-11-10, 55 y.o.   MRN: [id number]  HPI patient states she was doing fine and then started in the last 34 weeks to develop some irritation in her mid arch left in her right heel is also starting to hurt   Review of Systems     Objective:   Physical Exam Neurovascular status intact with pain in the mid arch area left with fluid buildup and excellent healing of the proximal site were endoscopic heel surgery was done with discomfort in the right plantar heel    Assessment:     Mid arch fasciitis irritation left with plantar fasciitis right    Plan:     Reviewed both conditions and at this time went ahead did careful mid arch injection left 3 mg dexamethasone Kenalog 5 mg Xylocaine and applied fascial strapping and gave her fascial strappings to applied home. Injected the right plantar fascia 3 mg Kenalog 5 mg Xylocaine placed on tramadol for daily usage and Vicodin for nighttime pain usage. Also she should use her night splint and boot and will be seen back in 3 weeks or earlier if needed

## 2016-06-20 ENCOUNTER — Ambulatory Visit (INDEPENDENT_AMBULATORY_CARE_PROVIDER_SITE_OTHER): Payer: Medicare PPO | Admitting: Psychiatry

## 2016-06-20 ENCOUNTER — Encounter (HOSPITAL_COMMUNITY): Payer: Self-pay | Admitting: Psychiatry

## 2016-06-20 VITALS — BP 130/100 | HR 91 | Ht 63.0 in | Wt 177.4 lb

## 2016-06-20 DIAGNOSIS — F319 Bipolar disorder, unspecified: Secondary | ICD-10-CM

## 2016-06-20 DIAGNOSIS — F419 Anxiety disorder, unspecified: Secondary | ICD-10-CM

## 2016-06-20 MED ORDER — CLONAZEPAM 1 MG PO TABS: ORAL_TABLET | ORAL | Status: DC

## 2016-06-20 MED ORDER — LITHIUM CARBONATE 600 MG PO CAPS: 600.0000 mg | ORAL_CAPSULE | Freq: Three times a day (TID) | ORAL | Status: DC

## 2016-06-20 MED ORDER — TOPIRAMATE 50 MG PO TABS: 50.0000 mg | ORAL_TABLET | Freq: Every day | ORAL | Status: DC

## 2016-06-20 MED ORDER — RISPERIDONE 2 MG PO TABS: ORAL_TABLET | ORAL | Status: DC

## 2016-06-20 MED ORDER — BUPROPION HCL ER (XL) 300 MG PO TB24: ORAL_TABLET | ORAL | Status: DC

## 2016-06-20 MED ORDER — HYDROXYZINE PAMOATE 25 MG PO CAPS: 25.0000 mg | ORAL_CAPSULE | Freq: Every day | ORAL | Status: DC

## 2016-06-20 NOTE — Progress Notes (Signed)
[facility] [id number] Progress Note  [name] [id number] 55 y.o.  06/20/2016 2:55 PM  Chief Complaint:  My son does not want to talk to me.  I'm not sure why.  It is making me nervous.    History of Present Illness:  [name] came for her follow-up appointment.   She is complaining of increased anxiety and nervousness.  She was very disappointed because her son did not call on Mother's Day or send a card.  She is not sure why he stopped talking to her.  She tried to call her a few times but he did not reply.  She mentioned some did not give any reason and that bothers her.  She admitted calling her mother on Mother's Day but find out that her mother's phonedisconnected.  She is feeling sad about the family situation but taking her medication.  Sometimes she is so nervous that she does not sleep well.  However she denies any suicidal thoughts or homicidal thought.  She denies any agitation, self abusive behavior.  Overall she described medicine helping her.  She had blood work on June 2 on her lithium level is 0.70.  Her hemoglobin A1c, CBC and basic chemistry was normal.  Her blood pressure is slightly increase but she denies any chest pain, dizziness, palpitation or any other associated symptoms.  Patient has history of hypertension which is controlled by diet patient denies drinking alcohol or using any illegal substances.  She denies any paranoia or any hallucination.  She is relieved that she had a very supportive husband.  She is also excited that going to [place] next week.  Her energy level is fair.  Her appetite is okay.  Suicidal Ideation: No Plan Formed: No Patient has means to carry out plan: No  Homicidal Ideation: No Plan Formed: No Patient has means to carry out plan: No  Medical History; Patient has history of brain surgery and abdominal hysterectomy.  She also had surgery for urinary incontinence, plantar fasciitis and knee surgery.  Patient has hypertension which is  controlled by diet. Her primary care physician is Dr. [name].   Past Psychiatric History/Hospitalization(s) Patient has history of bipolar disorder with multiple psychiatric inpatient treatment. Her last admission was September 2014 for suicidal thinking and hallucination. She was playing with the guns and superficially cut her wrist with a needle. She has history of mania and psychosis . In the past she had tried Zyprexa Seroquel however she is very resistant and reluctant to take any medication that can cause weight gain. She was tried tried Tegretol however she developed increased paranoia confusion and she was required hospitalization at [facility].  Anxiety: Yes Bipolar Disorder: Yes Depression: Yes Mania: Yes Psychosis: Yes Schizophrenia: No Personality Disorder: No Hospitalization for psychiatric illness: Yes History of Electroconvulsive Shock Therapy: No Prior Suicide Attempts: No   Review of Systems: Psychiatric: Agitation: No Hallucination: No Depressed Mood: No Insomnia: No Hypersomnia: No Altered Concentration: No Feels Worthless: No Grandiose Ideas: No Belief In Special Powers: No New/Increased Substance Abuse: No Compulsions: No  Neurologic: Headache: No Seizure: No Paresthesias: No  Outpatient Encounter Prescriptions as of 06/20/2016  Medication Sig  . buPROPion (WELLBUTRIN XL) 300 MG 24 hr tablet TAKE ONE TABLET BY MOUTH EVERY MORINING FOR DEPRESSION  . clonazePAM (KLONOPIN) 1 MG tablet TAKE ONE -TWO TABLETS AT BEDTIME  . lithium 600 MG capsule Take 1 capsule (600 mg total) by mouth 3 (three) times daily with meals.  . risperiDONE (RISPERDAL)  2 MG tablet TAKE ONE TABLET BY MOUTH AT BEDTIME AS NEEDED FOR MOOD CONTROL  . topiramate (TOPAMAX) 50 MG tablet Take 1 tablet (50 mg total) by mouth daily.  . traMADol (ULTRAM) 50 MG tablet Take 1 tablet (50 mg total) by mouth 3 (three) times daily.  . [DISCONTINUED] buPROPion (WELLBUTRIN XL) 300 MG  24 hr tablet TAKE ONE TABLET BY MOUTH EVERY MORINING FOR DEPRESSION  . [DISCONTINUED] clonazePAM (KLONOPIN) 1 MG tablet TAKE ONE -TWO TABLETS AT BEDTIME  . [DISCONTINUED] diclofenac (VOLTAREN) 75 MG EC tablet Take 1 tablet (75 mg total) by mouth 2 (two) times daily.  . [DISCONTINUED] hydrOXYzine (VISTARIL) 25 MG capsule Take 1 capsule (25 mg total) by mouth daily as needed for anxiety.  . [DISCONTINUED] risperiDONE (RISPERDAL) 2 MG tablet TAKE ONE TABLET BY MOUTH AT BEDTIME AS NEEDED FOR MOOD CONTROL  . [DISCONTINUED] topiramate (TOPAMAX) 50 MG tablet Take 1 tablet (50 mg total) by mouth daily.   No facility-administered encounter medications on file as of 06/20/2016.    Recent Results (from the past 2160 hour(s))  Lithium level     Status: Abnormal   Collection Time: 05/24/16  9:31 AM  Result Value Ref Range   Lithium Lvl 0.70 (L) 0.80 - 1.40 mEq/L  TSH     Status: None   Collection Time: 05/24/16  9:31 AM  Result Value Ref Range   TSH 1.79 mIU/L    Comment:   Reference Range   > or = 20 Years  0.40-4.50   Pregnancy Range First trimester  0.26-2.66 Second trimester 0.55-2.73 Third trimester  0.43-2.91     COMPLETE METABOLIC PANEL WITH GFR     Status: Abnormal   Collection Time: 05/24/16  9:31 AM  Result Value Ref Range   Sodium 140 135 - 146 mmol/L   Potassium 4.0 3.5 - 5.3 mmol/L   Chloride 110 98 - 110 mmol/L   CO2 19 (L) 20 - 31 mmol/L   Glucose, Bld 82 65 - 99 mg/dL   BUN 11 7 - 25 mg/dL   Creat 0.99 0.50 - 1.05 mg/dL   Total Bilirubin 0.5 0.2 - 1.2 mg/dL   Alkaline Phosphatase 81 33 - 130 U/L   AST 11 10 - 35 U/L   ALT 10 6 - 29 U/L   Total Protein 6.3 6.1 - 8.1 g/dL   Albumin 4.1 3.6 - 5.1 g/dL   Calcium 9.9 8.6 - 10.4 mg/dL   GFR, Est African American 76 >=60 mL/min   GFR, Est Non African American 66 >=60 mL/min    Comment:   The estimated GFR is a calculation valid for adults (>=36 years old) that uses the CKD-EPI algorithm to adjust for age and sex. It is    not to be used for children, pregnant women, hospitalized patients,    patients on dialysis, or with rapidly changing kidney function. According to the NKDEP, eGFR >89 is normal, 60-89 shows mild impairment, 30-59 shows moderate impairment, 15-29 shows severe impairment and <15 is ESRD.     CBC with Differential/Platelet     Status: None   Collection Time: 05/24/16  9:31 AM  Result Value Ref Range   WBC 7.0 3.8 - 10.8 K/uL   RBC 4.63 3.80 - 5.10 MIL/uL   Hemoglobin 14.5 11.7 - 15.5 g/dL   HCT 43.2 35.0 - 45.0 %   MCV 93.3 80.0 - 100.0 fL   MCH 31.3 27.0 - 33.0 pg   MCHC 33.6 32.0 - 36.0  g/dL   RDW 13.2 11.0 - 15.0 %   Platelets 268 140 - 400 K/uL   MPV 10.3 7.5 - 12.5 fL   Neutro Abs 4900 1500 - 7800 cells/uL   Lymphs Abs 1400 850 - 3900 cells/uL   Monocytes Absolute 560 200 - 950 cells/uL   Eosinophils Absolute 140 15 - 500 cells/uL   Basophils Absolute 0 0 - 200 cells/uL   Neutrophils Relative % 70 %   Lymphocytes Relative 20 %   Monocytes Relative 8 %   Eosinophils Relative 2 %   Basophils Relative 0 %   Smear Review Criteria for review not met     Comment: ** Please note change in unit of measure and reference range(s). **  Hemoglobin A1c     Status: None   Collection Time: 05/24/16  9:31 AM  Result Value Ref Range   Hgb A1c MFr Bld 5.0 <5.7 %    Comment:   For the purpose of screening for the presence of diabetes:   <5.7%       Consistent with the absence of diabetes 5.7-6.4 %   Consistent with increased risk for diabetes (prediabetes) >=6.5 %     Consistent with diabetes   This assay result is consistent with a decreased risk of diabetes.   Currently, no consensus exists regarding use of hemoglobin A1c for diagnosis of diabetes in children.   According to American Diabetes Association (ADA) guidelines, hemoglobin A1c <7.0% represents optimal control in non-pregnant diabetic patients. Different metrics may apply to specific patient populations. Standards of  Medical Care in Diabetes (ADA).      Mean Plasma Glucose 97 mg/dL    Physical Exam: Consitutional ;  BP 130/100 mmHg  Pulse 91  Ht _0  (1.6 m)  Wt 177 lb 6.4 oz (80.468 kg)  BMI 31.43 kg/m2  Musculoskeletal: Strength & Muscle Tone: within normal limits Gait & Station: normal, Difficulty walking due to boots in her ankle. Patient leans: N/A   Psychiatric Specialty Exam: General Appearance: Casual  Eye Contact::  Good  Speech:  Normal Rate  Volume:  Normal  Mood:  Anxious  Affect:  Congruent  Thought Process:  Coherent  Orientation:  Full (Time, Place, and Person)  Thought Content:  WDL  Suicidal Thoughts:  No  Homicidal Thoughts:  No  Memory:  Immediate;   Fair Recent;   Fair Remote;   Good  Judgement:  Fair  Insight:  Good  Psychomotor Activity:  Normal  Concentration:  Fair  Recall:  [employer] of Knowledge:  Fair  Language:  Fair  Akathisia:  No  Handed:  Right  AIMS (if indicated):     Assets:  Communication Skills Desire for Improvement Financial Resources/Insurance Housing  ADL's:  Intact  Cognition:  WNL  Sleep:        Established Problem, Stable/Improving (1), Review of Psycho-Social Stressors (1), Review or order clinical lab tests (1), Established Problem, Worsening (2), Review of Last Therapy Session (1) and Review of Medication Regimen & Side Effects (2)  Assessment: Bipolar disorder with psychotic features, anxiety disorder NOS  Axis III:  Past Medical History  Diagnosis Date  . HTN (hypertension)   . Mania ([place])   . Bipolar disorder ([place])   . Anxiety   . Depression     Plan:  I talked with the patient about her mild hypertension.  Encouraged to see her primary care physician for the management of hypertension.  Discuss psychosocial stressors.  I also  reviewed blood work results.  Her lithium level is normal.  Her CBC basic chemistry and hemoglobin A1c is also normal.  I will continue Vistaril 25 mg as needed for severe anxiety,  Risperdal 2 mg at bedtime, Topamax 50 mg at bedtime, lithium 600 mg 3 times a day and Klonopin 1 mg twice a day.   I offer counseling but patient declined. Recommended to call us back if she has any question or any concern.  Discuss safety plan that anytime having active suicidal thoughts or homicidal thoughts and she need to call 911 or go to local emergency room.  Cyrena Kuchenbecker T., MD 06/20/2016

## 2016-06-27 ENCOUNTER — Ambulatory Visit (HOSPITAL_COMMUNITY): Payer: Self-pay | Admitting: Psychiatry

## 2016-07-04 ENCOUNTER — Ambulatory Visit: Payer: Medicare PPO | Admitting: Podiatry

## 2016-07-08 ENCOUNTER — Ambulatory Visit (INDEPENDENT_AMBULATORY_CARE_PROVIDER_SITE_OTHER): Payer: Medicare PPO | Admitting: Podiatry

## 2016-07-08 DIAGNOSIS — M779 Enthesopathy, unspecified: Secondary | ICD-10-CM | POA: Diagnosis not present

## 2016-07-08 DIAGNOSIS — M722 Plantar fascial fibromatosis: Secondary | ICD-10-CM

## 2016-07-10 NOTE — Progress Notes (Signed)
Subjective:     Patient ID: [name], female   DOB: 1961/04/18, 55 y.o.   MRN: [id number]  HPI patient was states she was doing well but then she overdid it and it started to hurt again   Review of Systems     Objective:   Physical Exam Neurovascular status intact with pain continuing the last in the mid arch area left and into the lateral foot    Assessment:     Inflammatory tendinitis with doing well with plantar fascial surgery left    Plan:     Reapplied fascial strapping discussed continued exercises supportive shoe gear usage and discussed the difference in his problem versus the original fasciitis that we worked on anterior

## 2016-07-12 ENCOUNTER — Other Ambulatory Visit (HOSPITAL_COMMUNITY): Payer: Self-pay | Admitting: Psychiatry

## 2016-08-05 ENCOUNTER — Ambulatory Visit: Payer: Medicare PPO | Admitting: Podiatry

## 2016-08-16 ENCOUNTER — Encounter: Payer: Self-pay | Admitting: *Deleted

## 2016-08-16 NOTE — Progress Notes (Signed)
   DOS 03-05-16 EPF left foot

## 2016-08-23 ENCOUNTER — Encounter: Payer: Self-pay | Admitting: Podiatry

## 2016-08-23 ENCOUNTER — Ambulatory Visit (INDEPENDENT_AMBULATORY_CARE_PROVIDER_SITE_OTHER): Payer: Medicare PPO | Admitting: Podiatry

## 2016-08-23 DIAGNOSIS — M779 Enthesopathy, unspecified: Secondary | ICD-10-CM

## 2016-08-23 MED ORDER — DICLOFENAC SODIUM 75 MG PO TBEC: 75.0000 mg | DELAYED_RELEASE_TABLET | Freq: Two times a day (BID) | ORAL | 2 refills | Status: DC

## 2016-08-23 MED ORDER — PREDNISONE 10 MG PO TABS: ORAL_TABLET | ORAL | 0 refills | Status: DC

## 2016-08-23 MED ORDER — PREDNISONE 10 MG PO TABS: 10.0000 mg | ORAL_TABLET | Freq: Every day | ORAL | 0 refills | Status: DC

## 2016-08-23 NOTE — Progress Notes (Signed)
Subjective:     Patient ID: [name], female   DOB: 1961-04-15, 55 y.o.   MRN: [id number]  HPI patient states that she still is getting pain in her left forefoot and midfoot and is frustrated   Review of Systems     Objective:   Physical Exam Neurovascular status intact no pain left plantar heel from the plantar fascial release but moderate pain in the forefoot midfoot and is wearing the taping    Assessment:     Having inflammatory changes which is not related most likely to the insertional fasciitis but appears to be some form of tendinitis or other inflammatory condition    Plan:     Placed on sterile prep DS 12 day Dosepak to be followed by diclofenac along with heat and ice therapy stretching exercises and supportive shoe. Reappoint to recheck again in 6 weeks or earlier if needed

## 2016-08-28 ENCOUNTER — Ambulatory Visit: Payer: Medicare PPO | Admitting: Podiatry

## 2016-09-01 ENCOUNTER — Emergency Department (HOSPITAL_COMMUNITY): Payer: Medicare PPO

## 2016-09-01 ENCOUNTER — Emergency Department (HOSPITAL_COMMUNITY)
Admission: EM | Admit: 2016-09-01 | Discharge: 2016-09-01 | Disposition: A | Discharge status: Home / Self Care | Payer: Medicare PPO | Attending: Emergency Medicine | Admitting: Emergency Medicine

## 2016-09-01 ENCOUNTER — Encounter (HOSPITAL_COMMUNITY): Payer: Self-pay | Admitting: Emergency Medicine

## 2016-09-01 DIAGNOSIS — Z79899 Other long term (current) drug therapy: Secondary | ICD-10-CM | POA: Insufficient documentation

## 2016-09-01 DIAGNOSIS — G4459 Other complicated headache syndrome: Secondary | ICD-10-CM | POA: Diagnosis not present

## 2016-09-01 DIAGNOSIS — J341 Cyst and mucocele of nose and nasal sinus: Secondary | ICD-10-CM | POA: Diagnosis not present

## 2016-09-01 DIAGNOSIS — R51 Headache: Secondary | ICD-10-CM | POA: Diagnosis present

## 2016-09-01 DIAGNOSIS — K029 Dental caries, unspecified: Secondary | ICD-10-CM | POA: Diagnosis not present

## 2016-09-01 DIAGNOSIS — I10 Essential (primary) hypertension: Secondary | ICD-10-CM | POA: Insufficient documentation

## 2016-09-01 HISTORY — DX: Benign neoplasm of brain, unspecified: D33.2

## 2016-09-01 LAB — CBC WITH DIFFERENTIAL/PLATELET
Basophils Absolute: 0 10*3/uL (ref 0.0–0.1)
Basophils Relative: 0 %
Eosinophils Absolute: 0.1 10*3/uL (ref 0.0–0.7)
Eosinophils Relative: 1 %
HCT: 45.2 % (ref 36.0–46.0)
Hemoglobin: 15.5 g/dL — ABNORMAL HIGH (ref 12.0–15.0)
Lymphocytes Relative: 15 %
Lymphs Abs: 1.9 10*3/uL (ref 0.7–4.0)
MCH: 32 pg (ref 26.0–34.0)
MCHC: 34.3 g/dL (ref 30.0–36.0)
MCV: 93.4 fL (ref 78.0–100.0)
Monocytes Absolute: 0.8 10*3/uL (ref 0.1–1.0)
Monocytes Relative: 6 %
Neutro Abs: 9.8 10*3/uL — ABNORMAL HIGH (ref 1.7–7.7)
Neutrophils Relative %: 78 %
Platelets: 274 10*3/uL (ref 150–400)
RBC: 4.84 MIL/uL (ref 3.87–5.11)
RDW: 12.8 % (ref 11.5–15.5)
WBC: 12.6 10*3/uL — ABNORMAL HIGH (ref 4.0–10.5)

## 2016-09-01 LAB — BASIC METABOLIC PANEL
Anion gap: 12 (ref 5–15)
BUN: 16 mg/dL (ref 6–20)
CO2: 23 mmol/L (ref 22–32)
Calcium: 10 mg/dL (ref 8.9–10.3)
Chloride: 103 mmol/L (ref 101–111)
Creatinine, Ser: 1 mg/dL (ref 0.44–1.00)
GFR calc Af Amer: >60 mL/min (ref >60)
GFR calc non Af Amer: >60 mL/min (ref >60)
Glucose, Bld: 102 mg/dL — ABNORMAL HIGH (ref 65–99)
Potassium: 2.9 mmol/L — ABNORMAL LOW (ref 3.5–5.1)
Sodium: 138 mmol/L (ref 135–145)

## 2016-09-01 MED ORDER — KETOROLAC TROMETHAMINE 30 MG/ML IJ SOLN
30.0000 mg | Freq: Once | INTRAMUSCULAR | Status: AC
  Administered 2016-09-01: 30 mg via INTRAVENOUS
  Filled 2016-09-01: qty 1

## 2016-09-01 MED ORDER — METOCLOPRAMIDE HCL 5 MG/ML IJ SOLN
10.0000 mg | Freq: Once | INTRAMUSCULAR | Status: AC
  Administered 2016-09-01: 10 mg via INTRAVENOUS
  Filled 2016-09-01: qty 2

## 2016-09-01 MED ORDER — CLINDAMYCIN HCL 150 MG PO CAPS: 300.0000 mg | ORAL_CAPSULE | Freq: Four times a day (QID) | ORAL | 0 refills | Status: DC

## 2016-09-01 MED ORDER — DIPHENHYDRAMINE HCL 50 MG/ML IJ SOLN
12.5000 mg | Freq: Once | INTRAMUSCULAR | Status: AC
  Administered 2016-09-01: 12.5 mg via INTRAVENOUS
  Filled 2016-09-01: qty 1

## 2016-09-01 MED ORDER — HYDROCODONE-ACETAMINOPHEN 5-325 MG PO TABS
ORAL_TABLET | ORAL | 0 refills | Status: DC
Start: 2016-09-01 — End: 2017-04-10

## 2016-09-01 NOTE — ED Provider Notes (Signed)
[facility] DEPT Provider Note   CSN: [id number] Arrival date & time: 09/01/16  0908     History   Chief Complaint No chief complaint on file.   HPI [name] is a 55 y.o. female.  HPI   [name] is a 55 y.o. female who presents to the Emergency Department complaining of intermittent frontal and left sided headache for one month.  She describes a throbbing, tingling sensation that is constant.  It is associated with nausea, dizziness and tinnitus.  Pt has hx of benign meningoma with surgical removal in 2000.  She has been taking OTC Excedrin migraine without relief.  She denies neck pain or stiffness, speech difficulties, fever, chills, vomiting, photophobia.    Past Medical History:  Diagnosis Date  . Anxiety   . Bipolar disorder ([place])   . Depression   . HTN (hypertension)   . Mania [facility])     Patient Active Problem List   Diagnosis Date Noted  . Bipolar 1 disorder, depressed, moderate ([place]) 11/14/2012    Class: Chronic  . 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 ABDOMINAL HYSTERECTOMY    . BRAIN SURGERY    . INCONTINENCE SURGERY    . KNEE SURGERY     right x 2  . PLANTAR FASCIECTOMY Left 03/05/16    OB History    No data available       Home Medications    Prior to Admission medications   Medication Sig Start Date End Date Taking? Authorizing Provider  buPROPion (WELLBUTRIN XL) 300 MG 24 hr tablet TAKE ONE TABLET BY MOUTH EVERY MORINING FOR DEPRESSION 06/20/16   [name], MD  clonazePAM (KLONOPIN) 1 MG tablet TAKE ONE -TWO TABLETS AT BEDTIME 06/20/16   [name], MD  diclofenac (VOLTAREN) 75 MG EC tablet Take 1 tablet (75 mg total) by mouth 2 (two) times daily. 08/23/16   Wallene Huh, DPM  hydrOXYzine (VISTARIL) 25 MG capsule Take 1 capsule (25 mg total) by mouth at bedtime. 06/20/16   [name], MD  lithium  600 MG capsule Take 1 capsule (600 mg total) by mouth 3 (three) times daily with meals. 06/20/16   [name], MD  metoprolol succinate (TOPROL-XL) 25 MG 24 hr tablet Take 1 tablet by mouth daily. 07/22/16   Historical Provider, MD  predniSONE (DELTASONE) 10 MG tablet Take 1 tablet (10 mg total) by mouth daily with breakfast. 12 day tapering dose 08/23/16   Wallene Huh, DPM  risperiDONE (RISPERDAL) 2 MG tablet TAKE ONE TABLET BY MOUTH AT BEDTIME AS NEEDED FOR MOOD CONTROL 06/20/16   [name], MD  topiramate (TOPAMAX) 50 MG tablet Take 1 tablet (50 mg total) by mouth daily. 06/20/16   [name], MD  traMADol (ULTRAM) 50 MG tablet Take 1 tablet (50 mg total) by mouth 3 (three) times daily. 06/13/16   Wallene Huh, DPM    Family History Family History  Problem Relation Age of Onset  . Alcohol abuse Father   . Drug abuse Brother   . Mental illness Mother   . Dementia Neg Hx   . ADD / ADHD Neg Hx   . Anxiety disorder Neg Hx   . Bipolar disorder Neg Hx   . Depression Neg Hx   . OCD Neg Hx   . Paranoid behavior Neg  Hx   . Schizophrenia Neg Hx   . Seizures Neg Hx   . Sexual abuse Neg Hx   . Physical abuse Neg Hx   . Suicidality Neg Hx     Social History Social History  Substance Use Topics  . Smoking status: Never Smoker  . Smokeless tobacco: Never Used  . Alcohol use No     Allergies   Gabapentin; Oxcarbazepine; Tegretol [carbamazepine]; Codeine; Divalproex sodium; Erythromycin; and Phenytoin sodium extended   Review of Systems Review of Systems  Constitutional: Negative for activity change, appetite change and fever.  HENT: Negative for facial swelling and trouble swallowing.   Eyes: Positive for photophobia. Negative for pain and visual disturbance.  Respiratory: Negative for shortness of breath.   Cardiovascular: Negative for chest pain.  Gastrointestinal: Negative for nausea and vomiting.  Musculoskeletal: Negative for neck pain and neck stiffness.  Skin:  Negative for rash and wound.  Neurological: Positive for headaches. Negative for dizziness, facial asymmetry, speech difficulty, weakness and numbness.  Psychiatric/Behavioral: Negative for confusion and decreased concentration.  All other systems reviewed and are negative.    Physical Exam Updated Vital Signs BP 118/68 (BP Location: Right Arm)   Pulse 70   Temp 97.8 F (36.6 C) (Oral)   Resp 16   Ht 5\' 3"  (1.6 m)   Wt 79.8 kg   SpO2 98%   BMI 31.18 kg/m   Physical Exam  Constitutional: She is oriented to person, place, and time. She appears well-developed and well-nourished. No distress.  HENT:  Head: Normocephalic and atraumatic.  Mouth/Throat: Uvula is midline, oropharynx is clear and moist and mucous membranes are normal. No trismus in the jaw. Dental caries present. No uvula swelling.  Eyes: EOM are normal. Pupils are equal, round, and reactive to light.  Neck: Normal range of motion and phonation normal. Neck supple. No spinous process tenderness and no muscular tenderness present. No neck rigidity. No Kernig's sign noted.  Cardiovascular: Normal rate, regular rhythm and intact distal pulses.   No murmur heard. Pulmonary/Chest: Effort normal and breath sounds normal. No respiratory distress.  Musculoskeletal: Normal range of motion.  Neurological: She is alert and oriented to person, place, and time. She has normal strength. No cranial nerve deficit or sensory deficit. She exhibits normal muscle tone. Coordination and gait normal. GCS eye subscore is 4. GCS verbal subscore is 5. GCS motor subscore is 6.  Reflex Scores:      Tricep reflexes are 2+ on the right side and 2+ on the left side.      Bicep reflexes are 2+ on the right side and 2+ on the left side. Skin: Skin is warm and dry.  Psychiatric: She has a normal mood and affect.  Nursing note and vitals reviewed.    ED Treatments / Results  Labs (all labs ordered are listed, but only abnormal results are  displayed) Labs Reviewed - No data to display  EKG  EKG Interpretation None       Radiology Ct Head Wo Contrast  Result Date: 09/01/2016 CLINICAL DATA:  Middle and left-sided headache for 1 month. History of benign tumor removal in 2000. EXAM: CT HEAD WITHOUT CONTRAST TECHNIQUE: Contiguous axial images were obtained from the base of the skull through the vertex without intravenous contrast. COMPARISON:  None. FINDINGS: Brain: There is no evidence for acute hemorrhage, hydrocephalus, mass lesion, or abnormal extra-axial fluid collection. No definite CT evidence for acute infarction. Vascular: Atherosclerotic calcification is visualized in the carotid arteries. No  dense MCA sign. Major dural sinuses are unremarkable. Skull: Left temporal craniotomy defect noted.  No skull fracture. Sinuses/Orbits: The visualized paranasal sinuses and mastoid air cells are clear. Visualized portions of the globes and intraorbital fat are unremarkable. Other: N/A IMPRESSION: No acute intracranial abnormality. Electronically Signed   By: Misty Stanley M.D.   On: 09/01/2016 10:27   Ct Maxillofacial Wo Contrast  Result Date: 09/01/2016 CLINICAL DATA:  Intermittent headache for 1 month. Dizziness, nausea, and ringing in ears. History of brain tumor removed in 2000. EXAM: CT MAXILLOFACIAL WITHOUT CONTRAST TECHNIQUE: Multidetector CT imaging of the maxillofacial structures was performed. Multiplanar CT image reconstructions were also generated. A small metallic BB was placed on the right temple in order to reliably differentiate right from left. COMPARISON:  None. FINDINGS: Osseous: The patient is status post left craniotomy. No fractures or other bony abnormalities. Orbits: The orbits are normal. Sinuses: There is a mucous retention cyst or less likely a polyp in the inferior left maxillary sinus. The paranasal sinuses are otherwise normal. The mastoid air cells are well aerated. The middle ears are well right aerated as  well. The inner ears are symmetric with no obvious abnormality to explain dizziness. Soft tissues: Extracranial soft tissues including the orbits are within normal limits. Limited intracranial: Better evaluated on the CT of the brain from earlier today. IMPRESSION: Mucous retention cyst in the left maxillary sinus. Previous left craniotomy. No other abnormalities. Electronically Signed   By: Dorise Bullion III M.D   On: 09/01/2016 12:27     Procedures Procedures (including critical care time)  Medications Ordered in ED Medications - No data to display   Initial Impression / Assessment and Plan / ED Course  I have reviewed the triage vital signs and the nursing notes.  Pertinent labs & imaging results that were available during my care of the patient were reviewed by me and considered in my medical decision making (see chart for details).  Clinical Course   Pt is well appearing.  Non-toxic.  Vitals stable.    Pt with headache, no nuchal rigidity.  No focal neuro deficits.   CT maxillofacial shows mucus retention cyst of left sinus.     Pt is feeling better after medications.  Appears stable for d/c.  Agrees to close PMD f/u.  ER return precautions also given.    Final Clinical Impressions(s) / ED Diagnoses   Final diagnoses:  Other complicated headache syndrome  Mucous cyst of nasal sinus  Dental caries    New Prescriptions New Prescriptions   No medications on file     Bufford Lope 09/04/16 2158    Julianne Rice, MD 09/05/16 [phone]

## 2016-09-01 NOTE — ED Triage Notes (Signed)
Patient c/o intermittent headache x1 month. Per patient dizziness, nausea, and ringing in ears. Denies any vomiting or sensitivity to light.Patient states that she has also hd some disorientation as well. Patient has hx of brain tumor that was removed in 2000 with same symptoms and same effected area on left side that is predominantly hurting. Per patient has also had elevation in blood pressure x1 month ago and was placed on metoprolol.

## 2016-09-01 NOTE — Discharge Instructions (Signed)
Call the ENT doctor to arrange a follow-up appt if needed.

## 2016-09-19 ENCOUNTER — Ambulatory Visit (INDEPENDENT_AMBULATORY_CARE_PROVIDER_SITE_OTHER): Payer: Medicare PPO | Admitting: Psychiatry

## 2016-09-19 ENCOUNTER — Encounter (HOSPITAL_COMMUNITY): Payer: Self-pay | Admitting: Psychiatry

## 2016-09-19 DIAGNOSIS — F319 Bipolar disorder, unspecified: Secondary | ICD-10-CM | POA: Diagnosis not present

## 2016-09-19 MED ORDER — LITHIUM CARBONATE 600 MG PO CAPS: 600.0000 mg | ORAL_CAPSULE | Freq: Three times a day (TID) | ORAL | 2 refills | Status: DC

## 2016-09-19 MED ORDER — CLONAZEPAM 1 MG PO TABS: ORAL_TABLET | ORAL | 2 refills | Status: DC

## 2016-09-19 MED ORDER — BUPROPION HCL ER (XL) 300 MG PO TB24: ORAL_TABLET | ORAL | 2 refills | Status: DC

## 2016-09-19 MED ORDER — HYDROXYZINE PAMOATE 25 MG PO CAPS: 25.0000 mg | ORAL_CAPSULE | Freq: Every day | ORAL | 2 refills | Status: DC

## 2016-09-19 MED ORDER — TOPIRAMATE 50 MG PO TABS: 50.0000 mg | ORAL_TABLET | Freq: Every day | ORAL | 2 refills | Status: DC

## 2016-09-19 MED ORDER — RISPERIDONE 2 MG PO TABS: ORAL_TABLET | ORAL | 2 refills | Status: DC

## 2016-09-19 NOTE — Progress Notes (Signed)
[facility] Progress Note  [name] [id number] 55 y.o.  09/19/2016 2:15 PM  Chief Complaint:  I am having a lot of headaches.  I was seen in the emergency room and now scheduled to see neurologist on 16th.  I have an MRI and CT scan.  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  History of Present Illness:  [name] came for her follow-up appointment.  She is taking her psychiatric medication but lately complaining of headaches.  She was seen in the emergency room earlier this month and she had CT scan which shows cyst in her sinuses.  Patient has history of head tumor and craniotomy.  Now she is scheduled to see neurologist on 16.  Overall she described her mood is better.  She had a trip to [place] in summer where she stay for 10 days.  She still cannot watch TV because somehow she experience intense headaches, visual hallucination and seeing images while watching TV.  She is happy that finally her son able to communicate with her.  She sleeping on and off.  She denies any anger, paranoia, suicidal thoughts or homicidal thought.  She has blood work on November 10.  She has no tremors, shakes or any EPS.  She does not want to change her medication.  Her blood pressure is a stable since aching metoprolol.  Patient denies drinking alcohol or using any illegal substances.  Her appetite is fair.  Her energy level is okay.  Suicidal Ideation: No Plan Formed: No Patient has means to carry out plan: No  Homicidal Ideation: No Plan Formed: No Patient has means to carry out plan: No  Medical History; Patient has history of brain surgery and abdominal hysterectomy.  She also had surgery for urinary incontinence, plantar fasciitis and knee surgery.  Patient has hypertension which is controlled by diet. Her primary care physician is Dr. [name].   Past Psychiatric History/Hospitalization(s) Patient has history of bipolar disorder with multiple psychiatric inpatient treatment. Her last admission  was September 2014 for suicidal thinking and hallucination. She was playing with the guns and superficially cut her wrist with a needle. She has history of mania and psychosis . In the past she had tried Zyprexa Seroquel however she is very resistant and reluctant to take any medication that can cause weight gain. She was tried tried Tegretol however she developed increased paranoia confusion and she was required hospitalization at behavioral [facility].  Anxiety: Yes Bipolar Disorder: Yes Depression: Yes Mania: Yes Psychosis: Yes Schizophrenia: No Personality Disorder: No Hospitalization for psychiatric illness: Yes History of Electroconvulsive Shock Therapy: No Prior Suicide Attempts: No   Review of Systems: Psychiatric: Agitation: No Hallucination: No Depressed Mood: No Insomnia: No Hypersomnia: No Altered Concentration: No Feels Worthless: No Grandiose Ideas: No Belief In Special Powers: No New/Increased Substance Abuse: No Compulsions: No  Neurologic: Headache: No Seizure: No Paresthesias: No  Outpatient Encounter Prescriptions as of 09/19/2016  Medication Sig  . aspirin-acetaminophen-caffeine (EXCEDRIN MIGRAINE) 250-250-65 MG tablet Take 2 tablets by mouth every 6 (six) hours as needed for migraine.  Marland Kitchen buPROPion (WELLBUTRIN XL) 300 MG 24 hr tablet TAKE ONE TABLET BY MOUTH EVERY MORINING FOR DEPRESSION  . clindamycin (CLEOCIN) 150 MG capsule Take 2 capsules (300 mg total) by mouth 4 (four) times daily. For 7 days  . clonazePAM (KLONOPIN) 1 MG tablet TAKE ONE -TWO TABLETS AT BEDTIME  . diclofenac (VOLTAREN) 75 MG EC tablet Take 1 tablet (75 mg total) by mouth 2 (two)  times daily.  Marland Kitchen HYDROcodone-acetaminophen (NORCO/VICODIN) 5-325 MG tablet Take one-two tabs po q 4-6 hrs prn pain  . hydrOXYzine (VISTARIL) 25 MG capsule Take 1 capsule (25 mg total) by mouth at bedtime.  Marland Kitchen lithium 600 MG capsule Take 1 capsule (600 mg total) by mouth 3 (three) times daily with meals.   . metoprolol succinate (TOPROL-XL) 25 MG 24 hr tablet Take 12.5 mg by mouth daily.   . predniSONE (DELTASONE) 10 MG tablet Take 1 tablet (10 mg total) by mouth daily with breakfast. 12 day tapering dose  . risperiDONE (RISPERDAL) 2 MG tablet TAKE ONE TABLET BY MOUTH AT BEDTIME AS NEEDED FOR MOOD CONTROL  . topiramate (TOPAMAX) 50 MG tablet Take 1 tablet (50 mg total) by mouth daily.  . traMADol (ULTRAM) 50 MG tablet Take 1 tablet (50 mg total) by mouth 3 (three) times daily. (Patient not taking: Reported on 09/01/2016)  . [DISCONTINUED] buPROPion (WELLBUTRIN XL) 300 MG 24 hr tablet TAKE ONE TABLET BY MOUTH EVERY MORINING FOR DEPRESSION  . [DISCONTINUED] clonazePAM (KLONOPIN) 1 MG tablet TAKE ONE -TWO TABLETS AT BEDTIME  . [DISCONTINUED] hydrOXYzine (VISTARIL) 25 MG capsule Take 1 capsule (25 mg total) by mouth at bedtime.  . [DISCONTINUED] lithium 600 MG capsule Take 1 capsule (600 mg total) by mouth 3 (three) times daily with meals.  . [DISCONTINUED] risperiDONE (RISPERDAL) 2 MG tablet TAKE ONE TABLET BY MOUTH AT BEDTIME AS NEEDED FOR MOOD CONTROL  . [DISCONTINUED] topiramate (TOPAMAX) 50 MG tablet Take 1 tablet (50 mg total) by mouth daily.   No facility-administered encounter medications on file as of 09/19/2016.     Recent Results (from the past 2160 hour(s))  Basic metabolic panel     Status: Abnormal   Collection Time: 09/01/16 10:24 AM  Result Value Ref Range   Sodium 138 135 - 145 mmol/L   Potassium 2.9 (L) 3.5 - 5.1 mmol/L   Chloride 103 101 - 111 mmol/L   CO2 23 22 - 32 mmol/L   Glucose, Bld 102 (H) 65 - 99 mg/dL   BUN 16 6 - 20 mg/dL   Creatinine, Ser 1.00 0.44 - 1.00 mg/dL   Calcium 10.0 8.9 - 10.3 mg/dL   GFR calc non Af Amer >60 >60 mL/min   GFR calc Af Amer >60 >60 mL/min    Comment: (NOTE) The eGFR has been calculated using the CKD EPI equation. This calculation has not been validated in all clinical situations. eGFR's persistently <60 mL/min signify possible Chronic  Kidney Disease.    Anion gap 12 5 - 15  CBC with Differential     Status: Abnormal   Collection Time: 09/01/16 10:24 AM  Result Value Ref Range   WBC 12.6 (H) 4.0 - 10.5 K/uL   RBC 4.84 3.87 - 5.11 MIL/uL   Hemoglobin 15.5 (H) 12.0 - 15.0 g/dL   HCT 45.2 36.0 - 46.0 %   MCV 93.4 78.0 - 100.0 fL   MCH 32.0 26.0 - 34.0 pg   MCHC 34.3 30.0 - 36.0 g/dL   RDW 12.8 11.5 - 15.5 %   Platelets 274 150 - 400 K/uL   Neutrophils Relative % 78 %   Neutro Abs 9.8 (H) 1.7 - 7.7 K/uL   Lymphocytes Relative 15 %   Lymphs Abs 1.9 0.7 - 4.0 K/uL   Monocytes Relative 6 %   Monocytes Absolute 0.8 0.1 - 1.0 K/uL   Eosinophils Relative 1 %   Eosinophils Absolute 0.1 0.0 - 0.7 K/uL  Basophils Relative 0 %   Basophils Absolute 0.0 0.0 - 0.1 K/uL    Physical Exam: Consitutional ;  BP 122/89 (BP Location: Right Arm, Patient Position: Sitting, Cuff Size: Normal)   Pulse 76   Resp 12   Ht _0  (1.6 m)   Wt 181 lb 3.2 oz (82.2 kg)   BMI 32.10 kg/m   Musculoskeletal: Strength & Muscle Tone: within normal limits Gait & Station: normal Patient leans: N/A   Psychiatric Specialty Exam: General Appearance: Casual  Eye Contact::  Good  Speech:  Normal Rate  Volume:  Normal  Mood:  Anxious  Affect:  Congruent  Thought Process:  Coherent  Orientation:  Full (Time, Place, and Person)  Thought Content:  WDL  Suicidal Thoughts:  No  Homicidal Thoughts:  No  Memory:  Immediate;   Fair Recent;   Fair Remote;   Good  Judgement:  Fair  Insight:  Good  Psychomotor Activity:  Normal  Concentration:  Fair  Recall:  [employer] of Knowledge:  Fair  Language:  Fair  Akathisia:  No  Handed:  Right  AIMS (if indicated):     Assets:  Communication Skills Desire for Improvement Financial Resources/Insurance Housing  ADL's:  Intact  Cognition:  WNL  Sleep:        Established Problem, Stable/Improving (1), Review of Psycho-Social Stressors (1), Review or order clinical lab tests (1), Review of  Last Therapy Session (1) and Review of Medication Regimen & Side Effects (2)  Assessment: Bipolar disorder with psychotic features, anxiety disorder NOS  Axis III:  Past Medical History:  Diagnosis Date  . Anxiety   . Bipolar disorder ([place])   . Brain tumor (benign) ([place])   . Depression   . HTN (hypertension)   . Mania [facility])     Plan:  I reviewed records from emergency room including recent blood work results .  She will see neurologist for persistent headaches .  Patient has history of brain tumor and craniotomy .  She does not want to change her medication.  Her last lithium level is 0.70 which was done in June.  Discussed medication side effects and benefits.  Her blood pressure is much control since taking the metoprolol.  I will continue Risperdal 2 mg at bedtime, Wellbutrin XL 300 mg daily, Topamax 20 mg at bedtime, lithium 1800 mg daily, Klonopin 1 mg twice a day and Vistaril 25 mg as needed.  Discussed medication side effects and benefits.  Recommended to call us back if she has any question, concern if she feels worsening of the symptom.  Follow-up in 3 months. Discuss safety plan that anytime having active suicidal thoughts or homicidal thoughts and she need to call 911 or go to local emergency room.  [name] T., MD 09/19/2016      Patient ID: [name], female   DOB: August 19, 1961, 55 y.o.   MRN: [id number]

## 2016-09-27 ENCOUNTER — Ambulatory Visit: Payer: Medicare PPO | Admitting: Podiatry

## 2016-10-07 ENCOUNTER — Encounter: Payer: Self-pay | Admitting: Neurology

## 2016-10-07 ENCOUNTER — Ambulatory Visit (INDEPENDENT_AMBULATORY_CARE_PROVIDER_SITE_OTHER): Payer: Medicare PPO | Admitting: Neurology

## 2016-10-07 VITALS — BP 138/99 | HR 74 | Ht 63.0 in | Wt 128.8 lb

## 2016-10-07 DIAGNOSIS — G939 Disorder of brain, unspecified: Secondary | ICD-10-CM | POA: Diagnosis not present

## 2016-10-07 DIAGNOSIS — Z8603 Personal history of neoplasm of uncertain behavior: Secondary | ICD-10-CM

## 2016-10-07 DIAGNOSIS — F319 Bipolar disorder, unspecified: Secondary | ICD-10-CM | POA: Diagnosis not present

## 2016-10-07 DIAGNOSIS — Z9889 Other specified postprocedural states: Secondary | ICD-10-CM | POA: Diagnosis not present

## 2016-10-07 DIAGNOSIS — F0634 Mood disorder due to known physiological condition with mixed features: Secondary | ICD-10-CM | POA: Diagnosis not present

## 2016-10-07 DIAGNOSIS — F3132 Bipolar disorder, current episode depressed, moderate: Secondary | ICD-10-CM

## 2016-10-07 MED ORDER — RIZATRIPTAN BENZOATE 5 MG PO TBDP: 5.0000 mg | ORAL_TABLET | ORAL | 6 refills | Status: DC | PRN

## 2016-10-07 MED ORDER — ONDANSETRON 4 MG PO TBDP: 4.0000 mg | ORAL_TABLET | Freq: Three times a day (TID) | ORAL | 6 refills | Status: DC | PRN

## 2016-10-07 MED ORDER — TOPIRAMATE 100 MG PO TABS: 100.0000 mg | ORAL_TABLET | Freq: Two times a day (BID) | ORAL | 11 refills | Status: DC

## 2016-10-07 NOTE — Progress Notes (Signed)
PATIENT: [name] DOB: 08-Nov-1961  Chief Complaint  Patient presents with  . Migraine    She is here with her husband, [name]. States an increase in headache frequency over the last 4-5 months.  She has at least four headache days each week.  Depending on pain level, she will use Excedrin Migraine or hydrocodone for relief.  Reports past history of craniotomy for treatment of meningioma.  She would like to review her recent maxillofacial and head CT scans.     HISTORICAL  SHARDELL [name] is a 55 years old right-handed female, accompanied by her husband [name], seen in refer by  ENT Dr. Calvert Cantor for evaluation of frequent headaches, initial evaluation was October 07 2016, her primary care physician is Dr. 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  Since July 2017 she began to have recurrent headaches again, 4 times each week, severe left-sided retro-orbital area pounding headache with associated light noise sensitivity, nauseous, she has tried over-the-counter Excedrin Migraine, with limited help, headache last for 1 day, sometimes she take hydrocodone, which helps her some.   REVIEW OF SYSTEMS: Full 14 system review of systems performed and notable only for weight gain, fatigue, ringing  ears, feeling hot, confusion, headaches, sleepiness, depression, anxiety, too much sleep, increased energy, disinteresting activities, hallucinations, racing thoughts  ALLERGIES: Allergies  Allergen Reactions  . Gabapentin Anaphylaxis  . Oxcarbazepine Anaphylaxis  . Tegretol [Carbamazepine] Nausea And Vomiting  . Codeine Nausea Only  . Divalproex Sodium Swelling  . Erythromycin Nausea And Vomiting  . Phenytoin Sodium Extended Swelling  . Phenytoin Sodium Extended Rash    HOME MEDICATIONS: Current Outpatient Prescriptions  Medication Sig Dispense Refill  . aspirin-acetaminophen-caffeine (EXCEDRIN MIGRAINE) 250-250-65 MG tablet Take 2 tablets by mouth every 6 (six) hours as needed for migraine.    Marland Kitchen buPROPion (WELLBUTRIN XL) 300 MG 24 hr tablet TAKE ONE TABLET BY MOUTH EVERY MORINING FOR DEPRESSION 30 tablet 2  . clonazePAM (KLONOPIN) 1 MG tablet TAKE ONE -TWO TABLETS AT BEDTIME 62 tablet 2  . HYDROcodone-acetaminophen (NORCO/VICODIN) 5-325 MG tablet Take one-two tabs po q 4-6 hrs prn pain 10 tablet 0  . hydrOXYzine (VISTARIL) 25 MG capsule Take 1 capsule (25 mg total) by mouth at bedtime. 30 capsule 2  . lithium 600 MG capsule Take 1 capsule (600 mg total) by mouth 3 (three) times daily with meals. 90 capsule 2  . metoprolol succinate (TOPROL-XL) 25 MG 24 hr tablet Take 12.5 mg by mouth daily.     . risperiDONE (RISPERDAL) 2 MG tablet TAKE ONE TABLET BY MOUTH AT BEDTIME AS NEEDED FOR MOOD CONTROL 30 tablet 2  . topiramate (TOPAMAX) 50 MG tablet Take 1 tablet (50 mg total) by mouth daily. 30 tablet 2   No current facility-administered medications for this visit.     PAST MEDICAL HISTORY: Past Medical History:  Diagnosis Date  . Anxiety   . Bipolar disorder ([place])   . Brain tumor (benign) ([place])   . Depression   . HTN (hypertension)   . Mania ([place])   . Meningioma ([place])   . Migraine     PAST SURGICAL HISTORY: Past Surgical History:  Procedure Laterality Date  . ABDOMINAL  HYSTERECTOMY    . BRAIN SURGERY    . INCONTINENCE SURGERY    . INTERSTIM IMPLANT PLACEMENT    . KNEE SURGERY     right x 2  . PLANTAR FASCIECTOMY Left 03/05/16  . TUBAL LIGATION      FAMILY HISTORY: Family History  Problem Relation Age of Onset  . Alcohol abuse Father   . Diabetes Father   . Drug abuse Brother   . Mental illness Mother   . Breast cancer Mother   . Dementia Neg Hx   . ADD / ADHD Neg Hx   . Anxiety disorder Neg Hx   . Bipolar disorder Neg Hx   . Depression Neg Hx   . OCD Neg Hx   . Paranoid behavior Neg Hx   . Schizophrenia Neg Hx   . Seizures Neg Hx   . Sexual abuse Neg Hx   . Physical abuse Neg Hx   . Suicidality Neg Hx     SOCIAL HISTORY:  Social History   Social History  . Marital status: Married    Spouse name: N/A  . Number of children: 2  . Years of education: HS   Occupational History  . Disabled    Social History Main Topics  . Smoking status: Never Smoker  . Smokeless tobacco: Never Used  . Alcohol use No  . Drug use: No  . Sexual activity: No   Other Topics Concern  . Not on file   Social History Narrative   Lives at home with husband.   Right-handed.   2 cups caffeine per day.     PHYSICAL EXAM   Vitals:   10/07/16 1342  BP: (!) 138/99  Pulse: 74  Weight: 128 lb 12 oz (58.4 kg)  Height: 5\' 3"  (1.6 m)    Not recorded      Body mass index is 22.81 kg/m.  PHYSICAL EXAMNIATION:  Gen: NAD, conversant, well nourised, obese, well groomed                     Cardiovascular: Regular rate rhythm, no peripheral edema, warm, nontender. Eyes: Conjunctivae clear without exudates or hemorrhage Neck: Supple, no carotid bruise. Pulmonary: Clear to auscultation bilaterally   NEUROLOGICAL EXAM:  MENTAL STATUS: Speech:    Speech is normal; fluent and spontaneous with normal comprehension.  Cognition:     Orientation to time, place and person     Normal recent and remote memory     Normal Attention span and  concentration     Normal Language, naming, repeating,spontaneous speech     Fund of knowledge   CRANIAL NERVES: CN II: Visual fields are full to confrontation. Fundoscopic exam is normal with sharp discs and no vascular changes. Pupils are round equal and briskly reactive to light. CN III, IV, VI: extraocular movement are normal. No ptosis. CN V: Facial sensation is intact to pinprick in all 3 divisions bilaterally. Corneal responses are intact.  CN VII: Face is symmetric with normal eye closure and smile. CN VIII: Hearing is normal to rubbing fingers CN IX, X: Palate elevates symmetrically. Phonation is normal. CN XI: Head turning  and shoulder shrug are intact CN XII: Tongue is midline with normal movements and no atrophy.  MOTOR: There is no pronator drift of out-stretched arms. Muscle bulk and tone are normal. Muscle strength is normal.  REFLEXES: Reflexes are 2+ and symmetric at the biceps, triceps, knees, and ankles. Plantar responses are flexor.  SENSORY: Intact to light touch, pinprick, positional sensation and vibratory sensation are intact in fingers and toes.  COORDINATION: Rapid alternating movements and fine finger movements are intact. There is no dysmetria on finger-to-nose and heel-knee-shin.    GAIT/STANCE: Posture is normal. Gait is steady with normal steps, base, arm swing, and turning. Heel and toe walking are normal. Tandem gait is normal.  Romberg is absent.   DIAGNOSTIC DATA (LABS, IMAGING, TESTING) - I reviewed patient records, labs, notes, testing and imaging myself where available.   ASSESSMENT AND PLAN  AHMYAH MELSON is a 55 y.o. female   History of large size left temporal meningioma status post resection in 2000  Recurrent headaches with migraine features EEG Topamax 100 mg twice a day Maxalt, Zofran as needed  Bipolar disorder on polypharmacy treatment This including lithium 600 mg 3 times a day, Risperdal 2 mg daily, Wellbutrin 300 mg a day,  clonazepam 1 mg 2 tablets every night   Marcial Pacas, M.D. Ph.D.  [facility] [address], [place], [place] [address] Ph: [phone] Fax: [phone]  CC: Izora Gala, MD,  Earney Mallet, MD

## 2016-10-08 ENCOUNTER — Encounter: Payer: Self-pay | Admitting: Neurology

## 2016-10-08 ENCOUNTER — Telehealth: Payer: Self-pay | Admitting: *Deleted

## 2016-10-08 DIAGNOSIS — Z8603 Personal history of neoplasm of uncertain behavior: Principal | ICD-10-CM

## 2016-10-08 DIAGNOSIS — Z9889 Other specified postprocedural states: Secondary | ICD-10-CM

## 2016-10-08 NOTE — Telephone Encounter (Signed)
Patient called regarding tests. She would like to have her recent CT scans ordered with contrast.  She has EEG pending.

## 2016-10-09 ENCOUNTER — Other Ambulatory Visit: Payer: Self-pay | Admitting: *Deleted

## 2016-10-09 ENCOUNTER — Encounter: Payer: Self-pay | Admitting: *Deleted

## 2016-10-09 DIAGNOSIS — D329 Benign neoplasm of meninges, unspecified: Secondary | ICD-10-CM

## 2016-10-09 DIAGNOSIS — R51 Headache: Secondary | ICD-10-CM

## 2016-10-09 DIAGNOSIS — R519 Headache, unspecified: Secondary | ICD-10-CM

## 2016-10-09 NOTE — Addendum Note (Signed)
Addended by: Marcial Pacas on: 10/09/2016 12:53 PM   Modules accepted: Orders

## 2016-10-09 NOTE — Telephone Encounter (Signed)
Please call patient have ordered MRI of the brain with and without contrast, which is a better way to evaluate brain than the CAT scan of the brain

## 2016-10-09 NOTE — Telephone Encounter (Signed)
Patient has interstim implant and is requesting CT Head w/ contrast.  Dr. Krista Blue has approved the scan.  Pt aware to expect call for scheduling.

## 2016-10-13 ENCOUNTER — Other Ambulatory Visit (HOSPITAL_COMMUNITY): Payer: Self-pay | Admitting: Psychiatry

## 2016-10-13 DIAGNOSIS — F319 Bipolar disorder, unspecified: Secondary | ICD-10-CM

## 2016-10-14 ENCOUNTER — Telehealth (HOSPITAL_COMMUNITY): Payer: Self-pay

## 2016-10-14 ENCOUNTER — Telehealth: Payer: Self-pay | Admitting: Neurology

## 2016-10-14 NOTE — Telephone Encounter (Signed)
Patient called and lives in [place]. Patient want's to have CT scan at [facility].

## 2016-10-14 NOTE — Telephone Encounter (Signed)
Patient is calling to report on how the last four days went taking the Topamax 50 mg 1 po qhs and 1 50 mg po noon. Patient states she is doing okay, although after her noon dose she feels like there is sand in her eyes. Patient is going to increase again, going up to 100 mg qhs and keeping the noon dose at 50 mg for now. She will do this for a few days and call me back to see how she is feeling, at that time if all is well she will increase the noon dose.

## 2016-10-14 NOTE — Telephone Encounter (Signed)
Pt called back to make sure she was having CT scan and not MRI. She said [facility] asked her questions that were for MRI. I advised her the referral was for CT w/wo. Pt said she didn't want to get to [facility] and then not be able to have scan. I told her it was for CT and not to worry but she could call [facility] back and ask if she needed to. Pt said she was ok.

## 2016-10-14 NOTE — Telephone Encounter (Signed)
Called patient and she requested to know if she would have to pay all of the CT and I gave her the phone number to [facility] so she could ask them.

## 2016-10-16 ENCOUNTER — Encounter: Payer: Self-pay | Admitting: Neurology

## 2016-10-16 ENCOUNTER — Telehealth (HOSPITAL_COMMUNITY): Payer: Self-pay

## 2016-10-16 ENCOUNTER — Other Ambulatory Visit (HOSPITAL_COMMUNITY): Payer: Self-pay | Admitting: Psychiatry

## 2016-10-16 NOTE — Telephone Encounter (Signed)
Patient called me to talk about her Topamax. She is trying to titrate up to the dose that Dr. Krista Blue wants her on, however she is not able to do anything during the day, she is sleeping / napping more and just feels like she is in a fog. I advised her to call Dr. Rhea Belton office and let them know what was happening, she states she is still having the headaches as well, it does not seem to be working for her. I told her that she could ask Dr. Krista Blue for an alternate therapy, and call me back if she needs anything else.

## 2016-10-17 ENCOUNTER — Encounter: Payer: Self-pay | Admitting: Neurology

## 2016-10-18 ENCOUNTER — Other Ambulatory Visit: Payer: Self-pay | Admitting: Neurology

## 2016-10-18 ENCOUNTER — Encounter: Payer: Self-pay | Admitting: Neurology

## 2016-10-21 ENCOUNTER — Ambulatory Visit (HOSPITAL_COMMUNITY)
Admission: RE | Admit: 2016-10-21 | Discharge: 2016-10-21 | Disposition: A | Discharge status: Home / Self Care | Payer: Medicare PPO | Source: Ambulatory Visit | Attending: Neurology | Admitting: Neurology

## 2016-10-21 DIAGNOSIS — R51 Headache: Secondary | ICD-10-CM | POA: Insufficient documentation

## 2016-10-21 DIAGNOSIS — D329 Benign neoplasm of meninges, unspecified: Secondary | ICD-10-CM

## 2016-10-21 DIAGNOSIS — R519 Headache, unspecified: Secondary | ICD-10-CM

## 2016-10-21 MED ORDER — IOPAMIDOL (ISOVUE-300) INJECTION 61%
75.0000 mL | Freq: Once | INTRAVENOUS | Status: AC | PRN
  Administered 2016-10-21: 75 mL via INTRAVENOUS

## 2016-10-22 ENCOUNTER — Telehealth: Payer: Self-pay | Admitting: Neurology

## 2016-10-22 ENCOUNTER — Encounter: Payer: Self-pay | Admitting: Neurology

## 2016-10-22 NOTE — Telephone Encounter (Signed)
Pt called in about results for CT. She would like a call back. [phone]

## 2016-10-22 NOTE — Telephone Encounter (Signed)
Dr. Krista Blue has reviewed this patient's CT scan. She has sent her an email, through [facility], discussing these results.  I have followed this email with a phone call.  The patient verbalized understanding of results and will keep her pending appt for further discussion.

## 2016-10-24 ENCOUNTER — Other Ambulatory Visit: Payer: Medicare PPO

## 2016-10-24 ENCOUNTER — Encounter: Payer: Self-pay | Admitting: Neurology

## 2016-10-28 ENCOUNTER — Encounter: Payer: Self-pay | Admitting: Neurology

## 2016-10-30 ENCOUNTER — Encounter: Payer: Self-pay | Admitting: Neurology

## 2016-11-06 ENCOUNTER — Encounter: Payer: Self-pay | Admitting: Neurology

## 2016-11-06 ENCOUNTER — Ambulatory Visit (INDEPENDENT_AMBULATORY_CARE_PROVIDER_SITE_OTHER): Payer: Medicare PPO | Admitting: Neurology

## 2016-11-06 VITALS — BP 130/90 | HR 79 | Ht 63.0 in | Wt 189.5 lb

## 2016-11-06 DIAGNOSIS — IMO0002 Reserved for concepts with insufficient information to code with codable children: Secondary | ICD-10-CM

## 2016-11-06 DIAGNOSIS — G43709 Chronic migraine without aura, not intractable, without status migrainosus: Secondary | ICD-10-CM | POA: Diagnosis not present

## 2016-11-06 DIAGNOSIS — F3132 Bipolar disorder, current episode depressed, moderate: Secondary | ICD-10-CM

## 2016-11-06 DIAGNOSIS — Z8603 Personal history of neoplasm of uncertain behavior: Secondary | ICD-10-CM

## 2016-11-06 DIAGNOSIS — Z9889 Other specified postprocedural states: Secondary | ICD-10-CM

## 2016-11-06 DIAGNOSIS — F0634 Mood disorder due to known physiological condition with mixed features: Secondary | ICD-10-CM

## 2016-11-06 MED ORDER — BUTALBITAL-APAP-CAFFEINE 50-325-40 MG PO TABS: 1.0000 | ORAL_TABLET | Freq: Four times a day (QID) | ORAL | 5 refills | Status: DC | PRN

## 2016-11-06 NOTE — Progress Notes (Signed)
PATIENT: [name] DOB: Jul 05, 1961  Chief Complaint  Patient presents with  . Migraine    She is here with her husband, [name].  She was not able to tolerate the higher dose of Topamax (drowsiness, facial tingling) and has reduced it back down to 50mg  at bedtime.  She did not get the Maxalt filled because she was concerned about the side effects.  She has taken Zofran once, for nausea, and it worked well.  Says she gets 5-6 migraines per month.  . Hx of Menigioma    She would like to discuss her CT results.     HISTORICAL  [name] is a 55 years old right-handed female, accompanied by her husband [name], seen in refer by  ENT Dr. Calvert Cantor for evaluation of frequent headaches, initial evaluation was October 07 2016, her primary care physician is Dr. 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  Since July 2017 she began to have recurrent headaches again, 4 times each week, severe left-sided retro-orbital area pounding headache with associated light noise sensitivity, nauseous, she has tried over-the-counter Excedrin Migraine, with limited help, headache last for 1 day, sometimes she take hydrocodone, which helps her some.  UPDATE Nov 15rh 2017: She is with her husband at today's  clinical visit, tearful, worry about the CAT scan result,  I have personally reviewed repeat CAT scan with without contrast on October 21 2016, extra-axial calcific or osseous density deep to the left frontal temporal scalp flap, could represent incidental dural calcification or small recurrent meningioma, She could not tolerate higher dose of topamax 100mg  bid cause worsening depression, 100mg  daily cause facial paresthesia, she is now back on Topamax 50mg  qhs,  She gets migraine once a week, lasting for one hour, go away, intermittent, multiple recurrence during the day, her headache can go up to 9/10, she is taking excedrine migraine daily x 2 weeks,  She did not try Maxalt, worry about side effect, tried zofran was helpful.   REVIEW OF SYSTEMS: Full 14 system review of systems performed and notable only for except that thirst, appetite change, painful urination, daytime sleepiness, confusion, depression, anxiety, hallucination, memory loss, headaches  ALLERGIES: Allergies  Allergen Reactions  . Gabapentin Anaphylaxis  . Oxcarbazepine Anaphylaxis  . Tegretol [Carbamazepine] Nausea And Vomiting  . Codeine Nausea Only  . Divalproex Sodium Swelling  . Erythromycin Nausea And Vomiting  . Phenytoin Sodium Extended Swelling  . Phenytoin Sodium Extended Rash    HOME MEDICATIONS: Current Outpatient Prescriptions  Medication Sig Dispense Refill  . aspirin-acetaminophen-caffeine (EXCEDRIN MIGRAINE) 250-250-65 MG tablet Take 2 tablets by mouth every 6 (six) hours as needed for migraine.    Marland Kitchen buPROPion (WELLBUTRIN XL) 300 MG 24 hr tablet TAKE ONE TABLET BY MOUTH EVERY MORINING FOR DEPRESSION 30 tablet 2  . clonazePAM (KLONOPIN) 1 MG tablet TAKE ONE -TWO TABLETS  AT BEDTIME 62 tablet 2  . HYDROcodone-acetaminophen (NORCO/VICODIN) 5-325 MG tablet Take one-two tabs po q 4-6 hrs prn pain 10 tablet 0  . hydrOXYzine (VISTARIL) 25 MG capsule Take 1 capsule (25 mg total) by mouth at bedtime. 30 capsule 2    . lithium 600 MG capsule Take 1 capsule (600 mg total) by mouth 3 (three) times daily with meals. 90 capsule 2  . metoprolol succinate (TOPROL-XL) 25 MG 24 hr tablet Take 12.5 mg by mouth daily.     . ondansetron (ZOFRAN ODT) 4 MG disintegrating tablet Take 1 tablet (4 mg total) by mouth every 8 (eight) hours as needed. 20 tablet 6  . risperiDONE (RISPERDAL) 2 MG tablet TAKE ONE TABLET BY MOUTH AT BEDTIME AS NEEDED FOR MOOD CONTROL 30 tablet 2  . rizatriptan (MAXALT-MLT) 5 MG disintegrating tablet Take 1 tablet (5 mg total) by mouth as needed. May repeat in 2 hours if needed 15 tablet 6  . topiramate (TOPAMAX) 100 MG tablet Take 1 tablet (100 mg total) by mouth 2 (two) times daily. 60 tablet 11   No current facility-administered medications for this visit.     PAST MEDICAL HISTORY: Past Medical History:  Diagnosis Date  . Anxiety   . Bipolar disorder ([place])   . Brain tumor (benign) ([place])   . Depression   . HTN (hypertension)   . Mania ([place])   . Meningioma ([place])   . Migraine     PAST SURGICAL HISTORY: Past Surgical History:  Procedure Laterality Date  . ABDOMINAL HYSTERECTOMY    . BRAIN SURGERY    . INCONTINENCE SURGERY    . INTERSTIM IMPLANT PLACEMENT    . KNEE SURGERY     right x 2  . PLANTAR FASCIECTOMY Left 03/05/16  . TUBAL LIGATION      FAMILY HISTORY: Family History  Problem Relation Age of Onset  . Alcohol abuse Father   . Diabetes Father   . Drug abuse Brother   . Mental illness Mother   . Breast cancer Mother   . Dementia Neg Hx   . ADD / ADHD Neg Hx   . Anxiety disorder Neg Hx   . Bipolar disorder Neg Hx   . Depression Neg Hx   . OCD Neg Hx   . Paranoid behavior Neg Hx   . Schizophrenia Neg Hx   . Seizures Neg Hx   . Sexual abuse Neg Hx   . Physical abuse Neg Hx   . Suicidality Neg Hx     SOCIAL HISTORY:  Social History   Social History  . Marital status: Married    Spouse name: N/A  . Number of children: 2  . Years of education: HS    Occupational History  . Disabled    Social History Main Topics  . Smoking status: Never Smoker  . Smokeless tobacco: Never Used  . Alcohol use No  . Drug use: No  . Sexual activity: No   Other Topics Concern  . Not on file   Social History Narrative   Lives at home with husband.   Right-handed.   2 cups caffeine per day.     PHYSICAL EXAM   Vitals:   11/06/16 1432  BP: 130/90  Pulse: 79  Weight: 189 lb 8 oz (86 kg)  Height: 5\' 3"  (1.6 m)    Not recorded      Body mass index is 33.57 kg/m.  PHYSICAL EXAMNIATION:  Gen: NAD, conversant, well nourised, obese, well groomed  Cardiovascular: Regular rate rhythm, no peripheral edema, warm, nontender. Eyes: Conjunctivae clear without exudates or hemorrhage Neck: Supple, no carotid bruise. Pulmonary: Clear to auscultation bilaterally   NEUROLOGICAL EXAM:  MENTAL STATUS: Speech:    Speech is normal; fluent and spontaneous with normal comprehension.  Cognition:     Orientation to time, place and person     Normal recent and remote memory     Normal Attention span and concentration     Normal Language, naming, repeating,spontaneous speech     Fund of knowledge   CRANIAL NERVES: CN II: Visual fields are full to confrontation. Fundoscopic exam is normal with sharp discs and no vascular changes. Pupils are round equal and briskly reactive to light. CN III, IV, VI: extraocular movement are normal. No ptosis. CN V: Facial sensation is intact to pinprick in all 3 divisions bilaterally. Corneal responses are intact.  CN VII: Face is symmetric with normal eye closure and smile. CN VIII: Hearing is normal to rubbing fingers CN IX, X: Palate elevates symmetrically. Phonation is normal. CN XI: Head turning and shoulder shrug are intact CN XII: Tongue is midline with normal movements and no atrophy.  MOTOR: There is no pronator drift of out-stretched arms. Muscle bulk and tone are normal. Muscle  strength is normal.  REFLEXES: Reflexes are 2+ and symmetric at the biceps, triceps, knees, and ankles. Plantar responses are flexor.  SENSORY: Intact to light touch, pinprick, positional sensation and vibratory sensation are intact in fingers and toes.  COORDINATION: Rapid alternating movements and fine finger movements are intact. There is no dysmetria on finger-to-nose and heel-knee-shin.    GAIT/STANCE: Posture is normal. Gait is steady with normal steps, base, arm swing, and turning. Heel and toe walking are normal. Tandem gait is normal.  Romberg is absent.   DIAGNOSTIC DATA (LABS, IMAGING, TESTING) - I reviewed patient records, labs, notes, testing and imaging myself where available.   ASSESSMENT AND PLAN  [name] is a 55 y.o. 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 Ph.D.  [facility] [address] [place], [place] [address] Ph: [phone] Fax: [phone]  CC: Izora Gala, MD,  Earney Mallet, MD

## 2016-11-11 ENCOUNTER — Telehealth: Payer: Self-pay | Admitting: Neurology

## 2016-11-11 NOTE — Telephone Encounter (Signed)
Patient is calling to advise she would like her MRI scheduled at [facility].

## 2016-11-13 ENCOUNTER — Other Ambulatory Visit: Payer: Medicare PPO

## 2016-11-18 NOTE — Telephone Encounter (Signed)
Spoke with patient she is having her MRI done at [facility] on Thursday 11/21/16 at 5:00 pm.

## 2016-11-18 NOTE — Telephone Encounter (Signed)
Noted, thank you

## 2016-11-19 ENCOUNTER — Ambulatory Visit (HOSPITAL_COMMUNITY): Admission: RE | Admit: 2016-11-19 | Payer: Medicare PPO | Source: Ambulatory Visit

## 2016-11-21 ENCOUNTER — Ambulatory Visit (HOSPITAL_COMMUNITY): Payer: Medicare PPO

## 2016-11-21 ENCOUNTER — Ambulatory Visit (HOSPITAL_COMMUNITY)
Admission: RE | Admit: 2016-11-21 | Discharge: 2016-11-21 | Disposition: A | Discharge status: Home / Self Care | Payer: Medicare PPO | Source: Ambulatory Visit | Attending: Neurology | Admitting: Neurology

## 2016-11-21 DIAGNOSIS — Z9889 Other specified postprocedural states: Secondary | ICD-10-CM | POA: Insufficient documentation

## 2016-11-21 DIAGNOSIS — R930 Abnormal findings on diagnostic imaging of skull and head, not elsewhere classified: Secondary | ICD-10-CM | POA: Diagnosis not present

## 2016-11-21 DIAGNOSIS — F3132 Bipolar disorder, current episode depressed, moderate: Secondary | ICD-10-CM | POA: Insufficient documentation

## 2016-11-21 DIAGNOSIS — F0634 Mood disorder due to known physiological condition with mixed features: Secondary | ICD-10-CM

## 2016-11-21 DIAGNOSIS — Z8603 Personal history of neoplasm of uncertain behavior: Secondary | ICD-10-CM

## 2016-11-21 LAB — CREATININE, SERUM
Creatinine, Ser: 1.09 mg/dL — ABNORMAL HIGH (ref 0.44–1.00)
GFR calc Af Amer: >60 mL/min (ref >60)
GFR calc non Af Amer: 57 mL/min — ABNORMAL LOW (ref >60)

## 2016-11-21 MED ORDER — GADOBENATE DIMEGLUMINE 529 MG/ML IV SOLN
15.0000 mL | Freq: Once | INTRAVENOUS | Status: AC | PRN
  Administered 2016-11-21: 15 mL via INTRAVENOUS

## 2016-11-22 ENCOUNTER — Encounter: Payer: Self-pay | Admitting: Neurology

## 2016-11-22 ENCOUNTER — Telehealth: Payer: Self-pay | Admitting: Neurology

## 2016-11-22 ENCOUNTER — Other Ambulatory Visit (HOSPITAL_COMMUNITY): Payer: Medicare PPO

## 2016-11-22 NOTE — Telephone Encounter (Signed)
Per Dr Krista Blue, called patient and informed her that MRI results are similar to previous CT scan. Read patient results of MRI as directed by Dr Krista Blue. Patient stated that she wants to know for certain if she does or does not have another meningioma. She stated "Can't they look at it and tell me for sure? If I can find out, I'll be okay and just go on about my business. "   Patient declined follow up with NP, stated that her husband cannot get off work easily to drive her here from [place] just to get a report. She stated it is much easier for her if she gets a call instead. This RN stated she would route her request to Dr Krista Blue. Patient verbalized understanding, appreciation for call.

## 2016-11-22 NOTE — Telephone Encounter (Signed)
Pt called, she expressed concern to understand MRI results. She has sent 2 emails today (1 to [place] and 1 to Dr Krista Blue). Please call

## 2016-11-22 NOTE — Telephone Encounter (Signed)
I have called her. There is   Mild enhancement of left frontal dural nodule corresponding to lesion on prior contrast CT. Residual/recurrent meningioma is possible. A second smaller nodular focus inferiorly and posteriorly measuring 4 mm is also noted. If clinically indicated consider follow-up MRI to ensure stability.

## 2016-12-03 ENCOUNTER — Encounter: Payer: Self-pay | Admitting: Neurology

## 2016-12-12 ENCOUNTER — Ambulatory Visit: Payer: Medicare PPO | Admitting: Neurology

## 2016-12-26 ENCOUNTER — Ambulatory Visit (HOSPITAL_COMMUNITY): Payer: Self-pay | Admitting: Psychiatry

## 2017-01-13 ENCOUNTER — Other Ambulatory Visit (HOSPITAL_COMMUNITY): Payer: Self-pay | Admitting: Psychiatry

## 2017-01-13 DIAGNOSIS — F319 Bipolar disorder, unspecified: Secondary | ICD-10-CM

## 2017-01-14 ENCOUNTER — Encounter (HOSPITAL_COMMUNITY): Payer: Self-pay | Admitting: Psychiatry

## 2017-01-14 ENCOUNTER — Ambulatory Visit (INDEPENDENT_AMBULATORY_CARE_PROVIDER_SITE_OTHER): Payer: Medicare PPO | Admitting: Psychiatry

## 2017-01-14 VITALS — BP 129/92 | HR 90 | Ht 63.0 in | Wt 198.8 lb

## 2017-01-14 DIAGNOSIS — Z833 Family history of diabetes mellitus: Secondary | ICD-10-CM

## 2017-01-14 DIAGNOSIS — Z79899 Other long term (current) drug therapy: Secondary | ICD-10-CM | POA: Diagnosis not present

## 2017-01-14 DIAGNOSIS — Z803 Family history of malignant neoplasm of breast: Secondary | ICD-10-CM

## 2017-01-14 DIAGNOSIS — Z7982 Long term (current) use of aspirin: Secondary | ICD-10-CM

## 2017-01-14 DIAGNOSIS — Z811 Family history of alcohol abuse and dependence: Secondary | ICD-10-CM

## 2017-01-14 DIAGNOSIS — Z9851 Tubal ligation status: Secondary | ICD-10-CM

## 2017-01-14 DIAGNOSIS — Z9889 Other specified postprocedural states: Secondary | ICD-10-CM

## 2017-01-14 DIAGNOSIS — Z888 Allergy status to other drugs, medicaments and biological substances status: Secondary | ICD-10-CM

## 2017-01-14 DIAGNOSIS — Z818 Family history of other mental and behavioral disorders: Secondary | ICD-10-CM

## 2017-01-14 DIAGNOSIS — Z813 Family history of other psychoactive substance abuse and dependence: Secondary | ICD-10-CM

## 2017-01-14 DIAGNOSIS — F319 Bipolar disorder, unspecified: Secondary | ICD-10-CM

## 2017-01-14 MED ORDER — CLONAZEPAM 1 MG PO TABS: ORAL_TABLET | ORAL | 2 refills | Status: DC

## 2017-01-14 MED ORDER — TOPIRAMATE 50 MG PO TABS: 50.0000 mg | ORAL_TABLET | Freq: Every day | ORAL | 2 refills | Status: DC

## 2017-01-14 MED ORDER — RISPERIDONE 2 MG PO TABS: ORAL_TABLET | ORAL | 2 refills | Status: DC

## 2017-01-14 MED ORDER — LITHIUM CARBONATE 600 MG PO CAPS: 600.0000 mg | ORAL_CAPSULE | Freq: Three times a day (TID) | ORAL | 2 refills | Status: DC

## 2017-01-14 MED ORDER — BUPROPION HCL ER (XL) 300 MG PO TB24: ORAL_TABLET | ORAL | 2 refills | Status: DC

## 2017-01-14 MED ORDER — HYDROXYZINE PAMOATE 25 MG PO CAPS: 25.0000 mg | ORAL_CAPSULE | Freq: Every day | ORAL | 2 refills | Status: DC

## 2017-01-14 NOTE — Progress Notes (Signed)
BH MD/PA/NP OP Progress Note  01/14/2017 1:56 PM [name]  MRN:  [id number]  Chief Complaint:  Subjective:  I have brain tumor.  I'm very stressed.  I have a lot of headaches.  HPI: [name] came for her follow-up appointment.  She admitted lately more overwhelmed and his stress because she find out that she had a brain tumor.  She saw her neurologist in November and she had a MRI which shows lesion in her brain and her neurologist call her that she had a meningioma.  Patient has a history of brain tumor and craniotomy .  She was hoping to see his surgeon but her neurologist at this time wants to observe the tumor.  She endorse having headaches and sometimes confuse.  She scared to drive and she feels dependent on her husband.  She admitted lately having crying spells because she does not want to be burdened to other people.  She denies any hallucination or any paranoia but endorsed more social isolation and sad.  She denies any suicidal thoughts or homicidal thought.  She is taking her medication as prescribed.  She does not want to change or add any medication.  For the headaches she had tried higher dose of Topamax but she believe it make her more depressed.  She is only taking 50 mg Topamax.  She also endorses increased weight gain because she stopped diet soda .  She does not like drinking water and admitted drinking soda which cause weight gain.  She lives with her husband who is very supportive.  Patient denies drinking alcohol or using any illegal substances.  She denies any side effects including any tremors, shakes or any EPS.  Her energy level is fair.  Her vital signs are stable except weight gain.  She denies any mania, psychosis or any self abusive behavior.    Visit Diagnosis:    ICD-9-CM ICD-10-CM   1. Bipolar I disorder (HCC) 296.7 F31.9 topiramate (TOPAMAX) 50 MG tablet     risperiDONE (RISPERDAL) 2 MG tablet     lithium 600 MG capsule     hydrOXYzine (VISTARIL) 25 MG capsule   clonazePAM (KLONOPIN) 1 MG tablet     buPROPion (WELLBUTRIN XL) 300 MG 24 hr tablet  2. Bipolar 1 disorder (HCC) 296.7 F31.9   3. Encounter for long-term (current) use of medications V58.69 [id number] COMPLETE METABOLIC PANEL WITH GFR     Lithium level    Past Psychiatric History:  Patient has history of bipolar disorder with multiple psychiatric inpatient treatment. Her last admission was September 2014 for suicidal thinking and hallucination. She was playing with the guns and superficially cut her wrist with a needle. She has history of mania and psychosis . In the past she had tried Zyprexa Seroquel however she is very resistant and reluctant to take any medication that can cause weight gain. She was tried Tegretol however she developed increased paranoia confusion and she was required hospitalization at behavioral [facility].   Past Medical History:  Past Medical History:  Diagnosis Date  . Anxiety   . Bipolar disorder ([place])   . Brain tumor (benign) ([place])   . Depression   . HTN (hypertension)   . Mania ([place])   . Meningioma ([place])   . Migraine     Past Surgical History:  Procedure Laterality Date  . ABDOMINAL HYSTERECTOMY    . BRAIN SURGERY    . INCONTINENCE SURGERY    . INTERSTIM IMPLANT PLACEMENT    .  KNEE SURGERY     right x 2  . PLANTAR FASCIECTOMY Left 03/05/16  . TUBAL LIGATION      Family Psychiatric History: Reviewed.    Family History:  Family History  Problem Relation Age of Onset  . Alcohol abuse Father   . Diabetes Father   . Drug abuse Brother   . Mental illness Mother   . Breast cancer Mother   . Dementia Neg Hx   . ADD / ADHD Neg Hx   . Anxiety disorder Neg Hx   . Bipolar disorder Neg Hx   . Depression Neg Hx   . OCD Neg Hx   . Paranoid behavior Neg Hx   . Schizophrenia Neg Hx   . Seizures Neg Hx   . Sexual abuse Neg Hx   . Physical abuse Neg Hx   . Suicidality Neg Hx     Social History:  Social History   Social History  . Marital  status: Married    Spouse name: N/A  . Number of children: 2  . Years of education: HS   Occupational History  . Disabled    Social History Main Topics  . Smoking status: Never Smoker  . Smokeless tobacco: Never Used  . Alcohol use No  . Drug use: No  . Sexual activity: No   Other Topics Concern  . Not on file   Social History Narrative   Lives at home with husband.   Right-handed.   2 cups caffeine per day.    Allergies:  Allergies  Allergen Reactions  . Gabapentin Anaphylaxis  . Oxcarbazepine Anaphylaxis  . Tegretol [Carbamazepine] Nausea And Vomiting  . Codeine Nausea Only  . Divalproex Sodium Swelling  . Erythromycin Nausea And Vomiting  . Phenytoin Sodium Extended Swelling  . Phenytoin Sodium Extended Rash    Metabolic Disorder Labs: Lab Results  Component Value Date   HGBA1C 5.0 05/24/2016   MPG 97 05/24/2016   MPG 91 03/16/2015   No results found for: PROLACTIN Lab Results  Component Value Date   CHOL 174 05/13/2013   TRIG 48 05/13/2013   HDL 69 05/13/2013   CHOLHDL 2.5 05/13/2013   VLDL 10 05/13/2013   LDLCALC 95 05/13/2013     Current Medications: Current Outpatient Prescriptions  Medication Sig Dispense Refill  . aspirin-acetaminophen-caffeine (EXCEDRIN MIGRAINE) 250-250-65 MG tablet Take 2 tablets by mouth every 6 (six) hours as needed for migraine.    Marland Kitchen buPROPion (WELLBUTRIN XL) 300 MG 24 hr tablet TAKE ONE TABLET BY MOUTH EVERY MORINING FOR DEPRESSION 30 tablet 2  . butalbital-acetaminophen-caffeine (FIORICET, ESGIC) 50-325-40 MG tablet Take 1 tablet by mouth every 6 (six) hours as needed for headache. Only refill every 30 days or longer. 10 tablet 5  . clonazePAM (KLONOPIN) 1 MG tablet TAKE ONE -TWO TABLETS AT BEDTIME 62 tablet 2  . HYDROcodone-acetaminophen (NORCO/VICODIN) 5-325 MG tablet Take one-two tabs po q 4-6 hrs prn pain 10 tablet 0  . hydrOXYzine (VISTARIL) 25 MG capsule Take 1 capsule (25 mg total) by mouth at bedtime. 30 capsule  2  . lithium 600 MG capsule Take 1 capsule (600 mg total) by mouth 3 (three) times daily with meals. 90 capsule 2  . metoprolol succinate (TOPROL-XL) 25 MG 24 hr tablet Take 12.5 mg by mouth daily.     . ondansetron (ZOFRAN ODT) 4 MG disintegrating tablet Take 1 tablet (4 mg total) by mouth every 8 (eight) hours as needed. 20 tablet 6  . risperiDONE (RISPERDAL) 2  MG tablet TAKE ONE TABLET BY MOUTH AT BEDTIME AS NEEDED FOR MOOD CONTROL 30 tablet 2  . rizatriptan (MAXALT-MLT) 5 MG disintegrating tablet Take 1 tablet (5 mg total) by mouth as needed. May repeat in 2 hours if needed 15 tablet 6  . topiramate (TOPAMAX) 50 MG tablet Take 1 tablet (50 mg total) by mouth daily. 30 tablet 2   No current facility-administered medications for this visit.     Neurologic: Headache: Yes Seizure: No Paresthesias: No  Musculoskeletal: Strength & Muscle Tone: within normal limits Gait & Station: normal Patient leans: N/A  Psychiatric Specialty Exam: Review of Systems  Constitutional: Negative for weight loss.       Weight gain  HENT: Negative.   Respiratory: Negative.   Cardiovascular: Negative.   Genitourinary: Negative.   Musculoskeletal: Negative.   Skin: Negative for itching and rash.  Neurological: Positive for headaches.  Psychiatric/Behavioral: The patient is nervous/anxious.     Blood pressure (!) 129/92, pulse 90, height 5\' 3"  (1.6 m), weight 198 lb 12.8 oz (90.2 kg).Body mass index is 35.22 kg/m.  General Appearance: Casual  Eye Contact:  Fair  Speech:  Clear and Coherent  Volume:  Normal  Mood:  Anxious and Depressed  Affect:  Depressed  Thought Process:  Goal Directed  Orientation:  Full (Time, Place, and Person)  Thought Content: WDL and Logical   Suicidal Thoughts:  No  Homicidal Thoughts:  No  Memory:  Immediate;   Good Recent;   Good Remote;   Good  Judgement:  Fair  Insight:  Good  Psychomotor Activity:  Normal  Concentration:  Concentration: Fair and Attention  Span: Fair  Recall:  [place] of Knowledge: Good  Language: Good  Akathisia:  No  Handed:  Right  AIMS (if indicated):  0  Assets:  Communication Skills Desire for Improvement Housing Resilience Social Support  ADL's:  Intact  Cognition: WNL  Sleep:  fair   Assessment: Bipolar disorder with psychosis.  Anxiety disorder NOS.  Plan: I review her symptoms, history, current medication, records from other provider and MRI results.  Patient is upset with the diagnosis of brain tumor and she endorse increased anxiety and depression.  However she does not want to change or add any medication.  She is scheduled to see neurologist on February 8 and I recommended to have discussion with the neurologist to get a referral for neurosurgeon.  She has no tremors shakes or any EPS.  We discussed increased weight gain and I encouraged to drink water and to regular exercise.  I will continue Risperdal 2 mg at bedtime, Wellbutrin XL 300 mg daily, Topamax 50 mg at bedtime, lithium 1800 mg daily and Klonopin 1 mg twice a day.  She also takes Vistaril 25 mg as needed at bedtime.  I will do lithium level and intensive metabolic panel.  Discussed medication side effects and benefits.  Recommended to call us back if she has any question, concern if she feels worsening of the symptom.  Discuss safety plan that anytime having active suicidal thoughts or homicidal thoughts and she need to call 911 or go to the local emergency room.    Alania Overholt T., MD 01/14/2017, 1:56 PM

## 2017-01-30 ENCOUNTER — Ambulatory Visit: Payer: Medicare PPO | Admitting: Nurse Practitioner

## 2017-02-18 ENCOUNTER — Ambulatory Visit: Payer: Medicare PPO | Admitting: Nurse Practitioner

## 2017-03-18 IMAGING — CT CT MAXILLOFACIAL W/O CM
3 of 5 series · 16 of 47 positions shown, 19 images · non-contrast
Comparison: None.

CLINICAL DATA: Intermittent headache for 1 month. Dizziness,
nausea, and ringing in ears. History of brain tumor removed in 7999.

EXAM:
CT MAXILLOFACIAL WITHOUT CONTRAST
TECHNIQUE: Multidetector CT imaging of the maxillofacial structures was
performed. Multiplanar CT image reconstructions were also generated.
A small metallic BB was placed on the right temple in order to
reliably differentiate right from left.

[Series 2: max soft · axial · 0.33mm/px · z∈[-6,+142]mm · 12 of 86 slices shown, 15 images]
[im 6/86  brain]
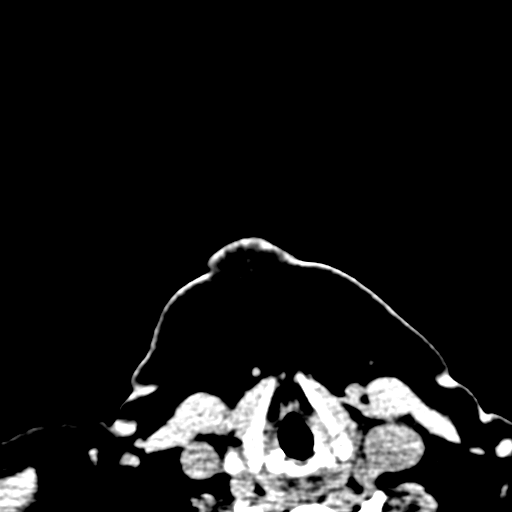
[im 6/86  bone]
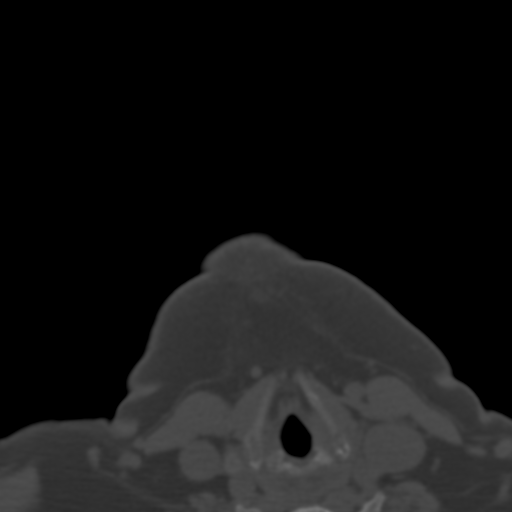
[im 12/86  bone]
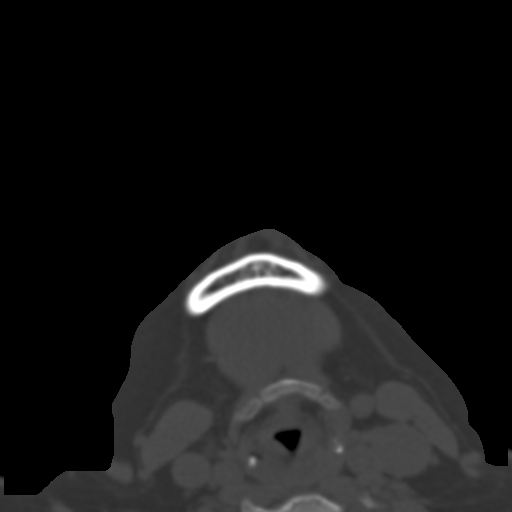
[im 18/86  bone]
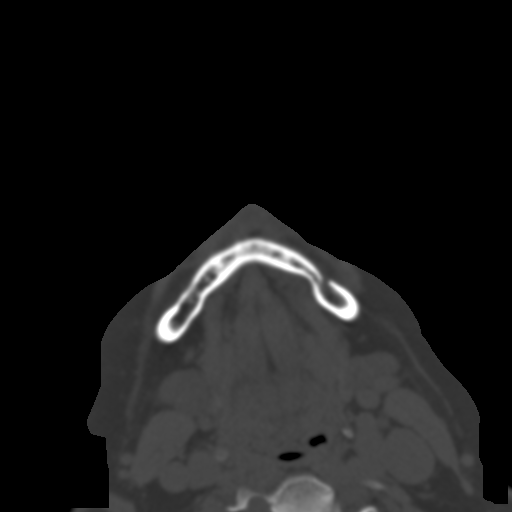
[im 27/86  bone]
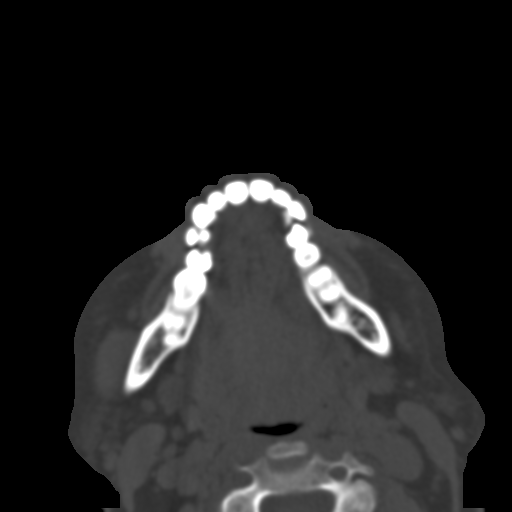
[im 33/86  brain]
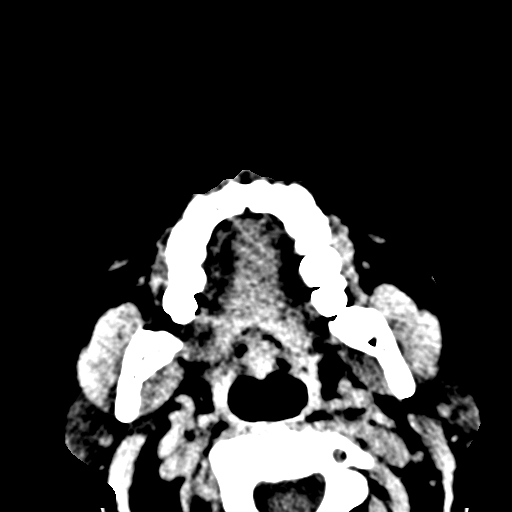
[im 33/86  bone]
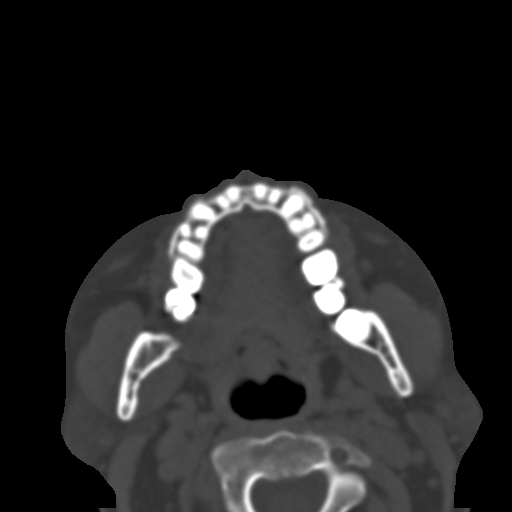
[im 39/86  bone]
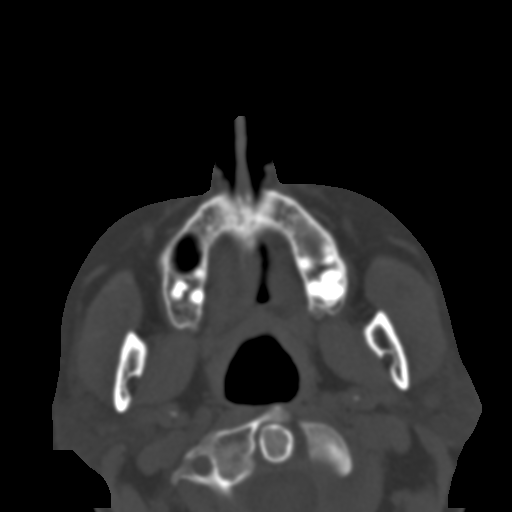
[im 47/86  bone]
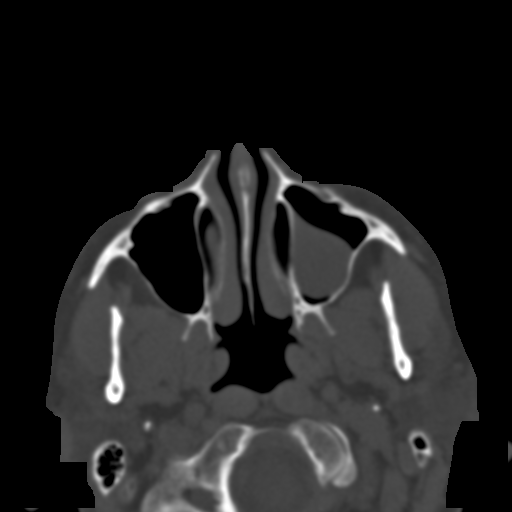
[im 53/86  bone]
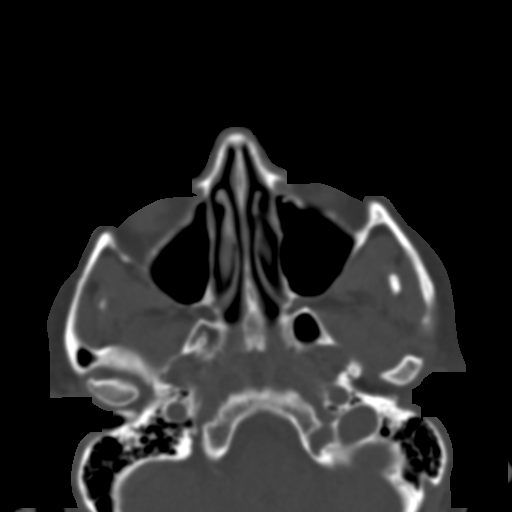
[im 59/86  brain]
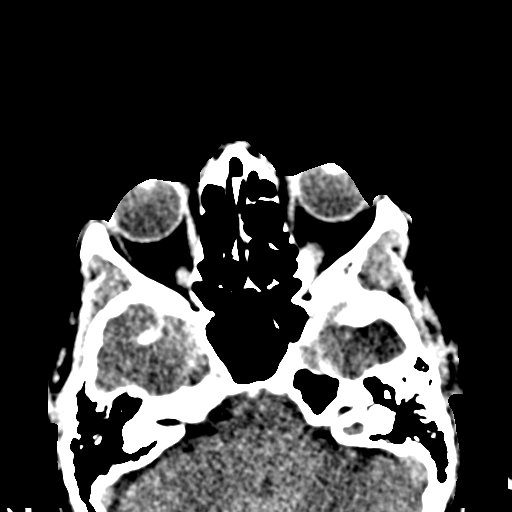
[im 59/86  bone]
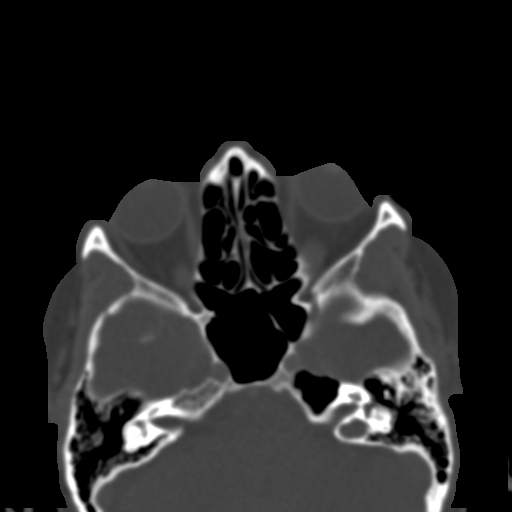
[im 68/86  bone]
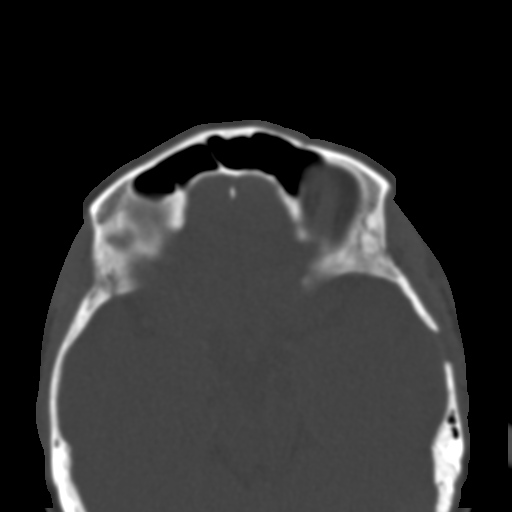
[im 74/86  bone]
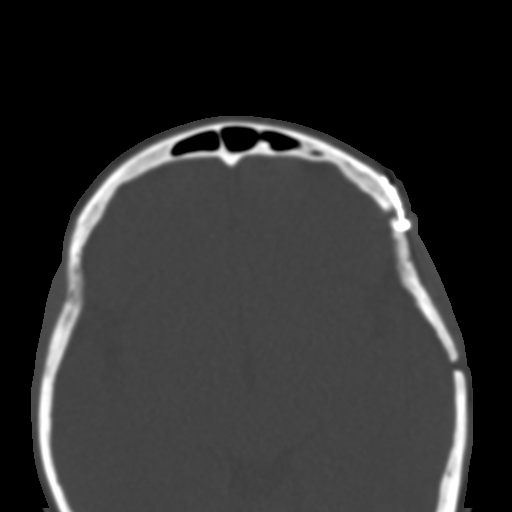
[im 80/86  bone]
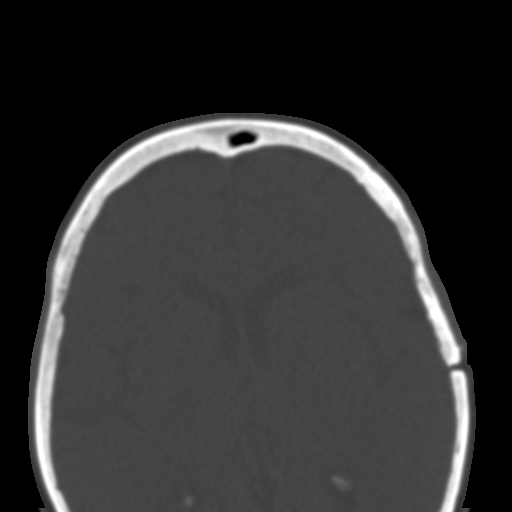

[Series 6: coronal bone · coronal · 0.33mm/px · 3 of 81 slices shown]
[im 21/81  bone]
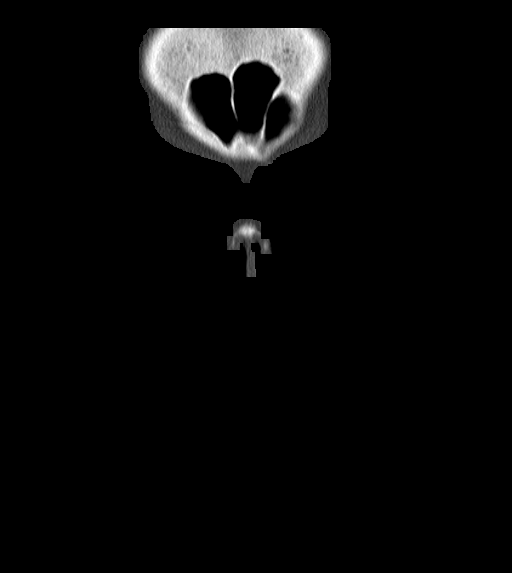
[im 41/81  bone]
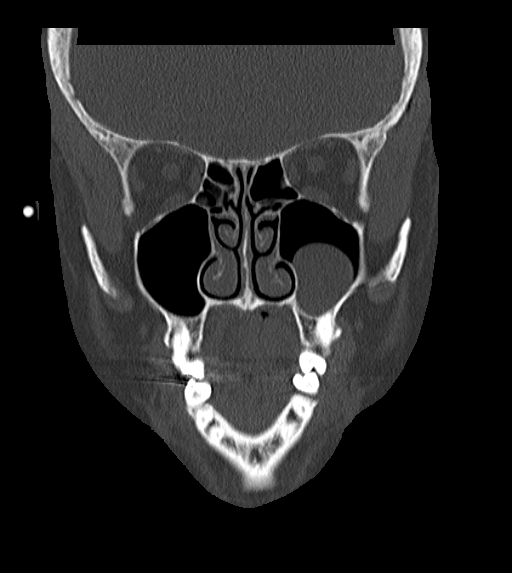
[im 61/81  bone]
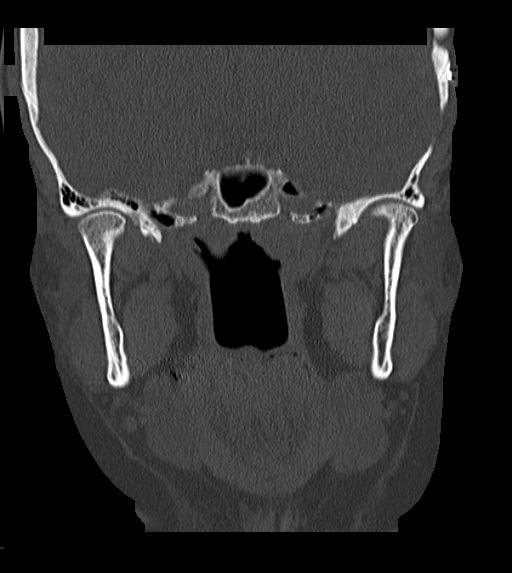

[Series 7: sagittal bone · sagittal · 0.33mm/px · 1 of 81 slices shown]
[im 41/81  bone]
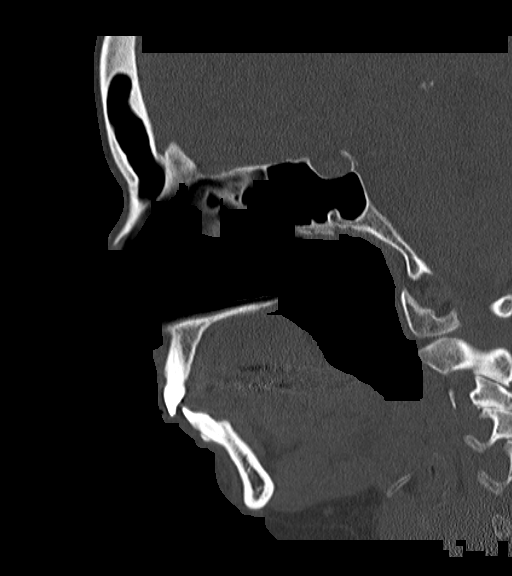

[16 of 47 positions shown; findings below may reference images not displayed]

FINDINGS: Osseous: The patient is status post left craniotomy. No fractures or
other bony abnormalities.

Orbits: The orbits are normal.

Sinuses: There is a mucous retention cyst or less likely a polyp in
the inferior left maxillary sinus. The paranasal sinuses are
otherwise normal. The mastoid air cells are well aerated. The middle
ears are well right aerated as well. The inner ears are symmetric
with no obvious abnormality to explain dizziness.

Soft tissues: Extracranial soft tissues including the orbits are
within normal limits.

Limited intracranial: Better evaluated on the CT of the brain from
earlier today.
IMPRESSION: Mucous retention cyst in the left maxillary sinus. Previous left
craniotomy. No other abnormalities.

## 2017-04-01 LAB — COMPLETE METABOLIC PANEL WITH GFR
ALT: 10 U/L (ref 6–29)
AST: 11 U/L (ref 10–35)
Albumin: 4 g/dL (ref 3.6–5.1)
Alkaline Phosphatase: 80 U/L (ref 33–130)
BUN: 14 mg/dL (ref 7–25)
CO2: 22 mmol/L (ref 20–31)
Calcium: 9.3 mg/dL (ref 8.6–10.4)
Chloride: 112 mmol/L — ABNORMAL HIGH (ref 98–110)
Creat: 1.04 mg/dL (ref 0.50–1.05)
GFR, Est African American: 71 mL/min (ref >=60)
GFR, Est Non African American: 61 mL/min (ref >=60)
Glucose, Bld: 87 mg/dL (ref 65–99)
Potassium: 4.2 mmol/L (ref 3.5–5.3)
Sodium: 141 mmol/L (ref 135–146)
Total Bilirubin: 0.5 mg/dL (ref 0.2–1.2)
Total Protein: 6 g/dL — ABNORMAL LOW (ref 6.1–8.1)

## 2017-04-01 LAB — LITHIUM LEVEL: Lithium Lvl: 0.8 mmol/L (ref 0.80–1.40)

## 2017-04-10 ENCOUNTER — Encounter (HOSPITAL_COMMUNITY): Payer: Self-pay | Admitting: Psychiatry

## 2017-04-10 ENCOUNTER — Ambulatory Visit (INDEPENDENT_AMBULATORY_CARE_PROVIDER_SITE_OTHER): Payer: Medicare PPO | Admitting: Psychiatry

## 2017-04-10 DIAGNOSIS — Z811 Family history of alcohol abuse and dependence: Secondary | ICD-10-CM

## 2017-04-10 DIAGNOSIS — Z818 Family history of other mental and behavioral disorders: Secondary | ICD-10-CM | POA: Diagnosis not present

## 2017-04-10 DIAGNOSIS — F319 Bipolar disorder, unspecified: Secondary | ICD-10-CM | POA: Diagnosis not present

## 2017-04-10 DIAGNOSIS — F419 Anxiety disorder, unspecified: Secondary | ICD-10-CM | POA: Diagnosis not present

## 2017-04-10 DIAGNOSIS — Z7982 Long term (current) use of aspirin: Secondary | ICD-10-CM

## 2017-04-10 DIAGNOSIS — Z813 Family history of other psychoactive substance abuse and dependence: Secondary | ICD-10-CM

## 2017-04-10 DIAGNOSIS — Z79899 Other long term (current) drug therapy: Secondary | ICD-10-CM | POA: Diagnosis not present

## 2017-04-10 MED ORDER — RISPERIDONE 2 MG PO TABS: ORAL_TABLET | ORAL | 2 refills | Status: DC

## 2017-04-10 MED ORDER — HYDROXYZINE PAMOATE 25 MG PO CAPS: 25.0000 mg | ORAL_CAPSULE | Freq: Every day | ORAL | 2 refills | Status: DC

## 2017-04-10 MED ORDER — CLONAZEPAM 1 MG PO TABS
ORAL_TABLET | ORAL | 2 refills | Status: DC
Start: 2017-04-10 — End: 2017-07-15

## 2017-04-10 MED ORDER — TOPIRAMATE 50 MG PO TABS: 50.0000 mg | ORAL_TABLET | Freq: Every day | ORAL | 2 refills | Status: DC

## 2017-04-10 MED ORDER — BUPROPION HCL ER (XL) 300 MG PO TB24: ORAL_TABLET | ORAL | 2 refills | Status: DC

## 2017-04-10 MED ORDER — LITHIUM CARBONATE 600 MG PO CAPS
600.0000 mg | ORAL_CAPSULE | Freq: Three times a day (TID) | ORAL | 2 refills | Status: DC
Start: 2017-04-10 — End: 2017-07-15

## 2017-04-10 NOTE — Progress Notes (Signed)
BH MD/PA/NP OP Progress Note  04/10/2017 2:43 PM [name]  MRN:  [id number]  Chief Complaint:  Chief Complaint    Follow-up     Subjective:  I am sleeping too much and I had gained weight.  HPI: [name] came for her follow-up appointment.  She recently seen a neurologist at [facility] Dr. Tommi Rumps for her headaches and brain tumor.  She was diagnosed with meningioma.  She told that her tumor is very tiny and at this time no intervention needed but she was given high dose Motrin to help pain.  She has taken high doses Motrin but she continues to have headaches and sometime she sleeping too much.  She scheduled to see him again in August for repeat MRI .  She is concerned about her weight gain.  She is taking Topamax, lithium, Wellbutrin, Risperdal and Vistaril.  She believe her depression is much better.  She denies any irritability, anger, mania or any psychosis.  She denies any crying spells or any feeling of hopelessness or worthlessness.  However she feels tired and lack of energy because of excessive weight gain.  She admitted not watching her diet regularly and she started walking but she get shortness of breath.  She has no tremors or shakes.  She lives with her husband who is very supportive.  Patient denies any recent panic attack or any aggressive behavior.  She does not want to change her medication however wondering if something can be done to reduce weight.  Patient denies taking alcohol or using any illegal substances.  She had lithium level which was 0.8 drawn on 04/01/2017.  Visit Diagnosis:    ICD-9-CM ICD-10-CM   1. Bipolar I disorder (HCC) 296.7 F31.9 topiramate (TOPAMAX) 50 MG tablet     risperiDONE (RISPERDAL) 2 MG tablet     hydrOXYzine (VISTARIL) 25 MG capsule     clonazePAM (KLONOPIN) 1 MG tablet     buPROPion (WELLBUTRIN XL) 300 MG 24 hr tablet     lithium 600 MG capsule    Past Psychiatric History: Reviewed. Patient has history of bipolar disorder with multiple psychiatric  inpatient treatment. Her last admission was September 2014 for suicidal thinking and hallucination. She was playing with the guns and superficially cut her wrist with a needle. She has history of mania and psychosis . In the past she had tried Zyprexa Seroquel however she is very resistant and reluctant to take any medication that can cause weight gain. She was tried Tegretol however she developed increased paranoia confusion and she was required hospitalization at behavioral [place].  Past Medical History:  Past Medical History:  Diagnosis Date  . Anxiety   . Bipolar disorder ([place])   . Brain tumor (benign) ([place])   . Depression   . HTN (hypertension)   . Mania ([place])   . Meningioma ([place])   . Migraine     Past Surgical History:  Procedure Laterality Date  . ABDOMINAL HYSTERECTOMY    . BRAIN SURGERY    . INCONTINENCE SURGERY    . INTERSTIM IMPLANT PLACEMENT    . KNEE SURGERY     right x 2  . PLANTAR FASCIECTOMY Left 03/05/16  . TUBAL LIGATION      Family Psychiatric History: Reviewed.  Family History:  Family History  Problem Relation Age of Onset  . Alcohol abuse Father   . Diabetes Father   . Drug abuse Brother   . Mental illness Mother   . Breast cancer Mother   .  Dementia Neg Hx   . ADD / ADHD Neg Hx   . Anxiety disorder Neg Hx   . Bipolar disorder Neg Hx   . Depression Neg Hx   . OCD Neg Hx   . Paranoid behavior Neg Hx   . Schizophrenia Neg Hx   . Seizures Neg Hx   . Sexual abuse Neg Hx   . Physical abuse Neg Hx   . Suicidality Neg Hx     Social History:  Social History   Social History  . Marital status: Married    Spouse name: N/A  . Number of children: 2  . Years of education: HS   Occupational History  . Disabled    Social History Main Topics  . Smoking status: Never Smoker  . Smokeless tobacco: Never Used  . Alcohol use No  . Drug use: No  . Sexual activity: No   Other Topics Concern  . None   Social History Narrative   Lives  at home with husband.   Right-handed.   2 cups caffeine per day.    Allergies:  Allergies  Allergen Reactions  . Gabapentin Anaphylaxis  . Oxcarbazepine Anaphylaxis  . Tegretol [Carbamazepine] Nausea And Vomiting  . Codeine Nausea Only  . Divalproex Sodium Swelling  . Erythromycin Nausea And Vomiting  . Phenytoin Sodium Extended Swelling  . Phenytoin Sodium Extended Rash    Metabolic Disorder Labs: Recent Results (from the past 2160 hour(s))  COMPLETE METABOLIC PANEL WITH GFR     Status: Abnormal   Collection Time: 04/01/17  8:47 AM  Result Value Ref Range   Sodium 141 135 - 146 mmol/L   Potassium 4.2 3.5 - 5.3 mmol/L   Chloride 112 (H) 98 - 110 mmol/L   CO2 22 20 - 31 mmol/L   Glucose, Bld 87 65 - 99 mg/dL   BUN 14 7 - 25 mg/dL   Creat 1.04 0.50 - 1.05 mg/dL    Comment:   For patients > or = 54 years of age: The upper reference limit for Creatinine is approximately 13% higher for people identified as African-American.      Total Bilirubin 0.5 0.2 - 1.2 mg/dL   Alkaline Phosphatase 80 33 - 130 U/L   AST 11 10 - 35 U/L   ALT 10 6 - 29 U/L   Total Protein 6.0 (L) 6.1 - 8.1 g/dL   Albumin 4.0 3.6 - 5.1 g/dL   Calcium 9.3 8.6 - 10.4 mg/dL   GFR, Est African American 71 >=60 mL/min   GFR, Est Non African American 61 >=60 mL/min  Lithium level     Status: None   Collection Time: 04/01/17  8:47 AM  Result Value Ref Range   Lithium Lvl 0.8 0.80 - 1.40 mmol/L    Comment: ** Please note change in unit of measure and reference range(s). **   Lab Results  Component Value Date   HGBA1C 5.0 05/24/2016   MPG 97 05/24/2016   MPG 91 03/16/2015   No results found for: PROLACTIN Lab Results  Component Value Date   CHOL 174 05/13/2013   TRIG 48 05/13/2013   HDL 69 05/13/2013   CHOLHDL 2.5 05/13/2013   VLDL 10 05/13/2013   LDLCALC 95 05/13/2013     Current Medications: Current Outpatient Prescriptions  Medication Sig Dispense Refill  . aspirin-acetaminophen-caffeine  (EXCEDRIN MIGRAINE) 250-250-65 MG tablet Take 2 tablets by mouth every 6 (six) hours as needed for migraine.    Marland Kitchen buPROPion (WELLBUTRIN XL)  300 MG 24 hr tablet TAKE ONE TABLET BY MOUTH EVERY MORINING FOR DEPRESSION 30 tablet 2  . butalbital-acetaminophen-caffeine (FIORICET, ESGIC) 50-325-40 MG tablet Take 1 tablet by mouth every 6 (six) hours as needed for headache. Only refill every 30 days or longer. 10 tablet 5  . clonazePAM (KLONOPIN) 1 MG tablet TAKE ONE -TWO TABLETS AT BEDTIME 62 tablet 2  . HYDROcodone-acetaminophen (NORCO/VICODIN) 5-325 MG tablet Take one-two tabs po q 4-6 hrs prn pain 10 tablet 0  . hydrOXYzine (VISTARIL) 25 MG capsule Take 1 capsule (25 mg total) by mouth at bedtime. 30 capsule 2  . ibuprofen (ADVIL,MOTRIN) 800 MG tablet Take 800 mg by mouth 3 (three) times daily.    Marland Kitchen lithium 600 MG capsule Take 1 capsule (600 mg total) by mouth 3 (three) times daily with meals. 90 capsule 2  . metoprolol succinate (TOPROL-XL) 25 MG 24 hr tablet Take 12.5 mg by mouth daily.     . ondansetron (ZOFRAN ODT) 4 MG disintegrating tablet Take 1 tablet (4 mg total) by mouth every 8 (eight) hours as needed. 20 tablet 6  . pantoprazole (PROTONIX) 40 MG tablet Take 40 mg by mouth daily.    . risperiDONE (RISPERDAL) 2 MG tablet TAKE ONE TABLET BY MOUTH AT BEDTIME AS NEEDED FOR MOOD CONTROL 30 tablet 2  . rizatriptan (MAXALT-MLT) 5 MG disintegrating tablet Take 1 tablet (5 mg total) by mouth as needed. May repeat in 2 hours if needed 15 tablet 6  . topiramate (TOPAMAX) 50 MG tablet Take 1 tablet (50 mg total) by mouth daily. 30 tablet 2   No current facility-administered medications for this visit.     Neurologic: Headache: Yes Seizure: No Paresthesias: No  Musculoskeletal: Strength & Muscle Tone: within normal limits Gait & Station: normal Patient leans: N/A  Psychiatric Specialty Exam: ROS  Blood pressure 124/78, pulse 80, height 5\' 3"  (1.6 m), weight 206 lb 9.6 oz (93.7 kg).Body mass  index is 36.6 kg/m.  General Appearance: Casual  Eye Contact:  Good  Speech:  Slow  Volume:  Normal  Mood:  Euthymic  Affect:  Congruent  Thought Process:  Coherent  Orientation:  Full (Time, Place, and Person)  Thought Content: WDL and Logical   Suicidal Thoughts:  No  Homicidal Thoughts:  No  Memory:  Immediate;   Good Recent;   Good Remote;   Good  Judgement:  Good  Insight:  Good  Psychomotor Activity:  Normal  Concentration:  Concentration: Good and Attention Span: Good  Recall:  Good  Fund of Knowledge: Good  Language: Good  Akathisia:  No  Handed:  Right  AIMS (if indicated):  0  Assets:  Communication Skills Desire for Improvement Housing Resilience Social Support  ADL's:  Intact  Cognition: WNL  Sleep:  Good     Assessment: Bipolar disorder with psychosis.  Anxiety disorder NOS.  Plan: I review her blood work.  Her lithium level is therapeutic 0.8.  Her depression and mania is as stable.  She is concerned about weight gain and sedation.  I recommended she can try Risperdal half tablet at bedtime however if she started to feel depressed, paranoia or having manic symptoms then she need to go back on 2 mg.  She will also call her neurologist since current medicine for headaches is not working.  I will continue Wellbutrin XL 300 mg daily, Topamax 50 mg at bedtime, lithium 1800 mg daily, Klonopin 1 mg twice a day and Risperdal she will try to  milligram half tablet to see if she can be less sedated and able to lose weight.  Encouraged to watch her calorie intake and try to walk gradually.  Recommended to call us back if she has any question or any concern.  Follow-up in 3 months. Haley Fuerstenberg T., MD 04/10/2017, 2:43 PM

## 2017-04-12 ENCOUNTER — Other Ambulatory Visit (HOSPITAL_COMMUNITY): Payer: Self-pay | Admitting: Psychiatry

## 2017-04-12 DIAGNOSIS — F319 Bipolar disorder, unspecified: Secondary | ICD-10-CM

## 2017-04-21 ENCOUNTER — Other Ambulatory Visit (HOSPITAL_COMMUNITY): Payer: Self-pay | Admitting: Psychiatry

## 2017-05-12 ENCOUNTER — Other Ambulatory Visit (HOSPITAL_COMMUNITY): Payer: Self-pay | Admitting: Psychiatry

## 2017-05-12 DIAGNOSIS — F319 Bipolar disorder, unspecified: Secondary | ICD-10-CM

## 2017-06-16 ENCOUNTER — Other Ambulatory Visit (HOSPITAL_COMMUNITY): Payer: Self-pay | Admitting: Psychiatry

## 2017-06-16 DIAGNOSIS — F319 Bipolar disorder, unspecified: Secondary | ICD-10-CM

## 2017-06-17 ENCOUNTER — Other Ambulatory Visit (HOSPITAL_COMMUNITY): Payer: Self-pay

## 2017-06-17 DIAGNOSIS — F319 Bipolar disorder, unspecified: Secondary | ICD-10-CM

## 2017-06-17 MED ORDER — RISPERIDONE 2 MG PO TABS: ORAL_TABLET | ORAL | 0 refills | Status: DC

## 2017-07-12 ENCOUNTER — Other Ambulatory Visit (HOSPITAL_COMMUNITY): Payer: Self-pay | Admitting: Psychiatry

## 2017-07-12 DIAGNOSIS — F319 Bipolar disorder, unspecified: Secondary | ICD-10-CM

## 2017-07-15 ENCOUNTER — Encounter (HOSPITAL_COMMUNITY): Payer: Self-pay | Admitting: Psychiatry

## 2017-07-15 ENCOUNTER — Ambulatory Visit (INDEPENDENT_AMBULATORY_CARE_PROVIDER_SITE_OTHER): Payer: Medicare PPO | Admitting: Psychiatry

## 2017-07-15 DIAGNOSIS — F319 Bipolar disorder, unspecified: Secondary | ICD-10-CM

## 2017-07-15 DIAGNOSIS — Z813 Family history of other psychoactive substance abuse and dependence: Secondary | ICD-10-CM | POA: Diagnosis not present

## 2017-07-15 DIAGNOSIS — Z811 Family history of alcohol abuse and dependence: Secondary | ICD-10-CM

## 2017-07-15 DIAGNOSIS — Z818 Family history of other mental and behavioral disorders: Secondary | ICD-10-CM

## 2017-07-15 MED ORDER — BUPROPION HCL ER (XL) 300 MG PO TB24: ORAL_TABLET | ORAL | 2 refills | Status: DC

## 2017-07-15 MED ORDER — HYDROXYZINE PAMOATE 25 MG PO CAPS: 25.0000 mg | ORAL_CAPSULE | Freq: Two times a day (BID) | ORAL | 2 refills | Status: DC

## 2017-07-15 MED ORDER — CLONAZEPAM 1 MG PO TABS: ORAL_TABLET | ORAL | 2 refills | Status: DC

## 2017-07-15 MED ORDER — LITHIUM CARBONATE 600 MG PO CAPS: 600.0000 mg | ORAL_CAPSULE | Freq: Three times a day (TID) | ORAL | 2 refills | Status: DC

## 2017-07-15 MED ORDER — RISPERIDONE 2 MG PO TABS: ORAL_TABLET | ORAL | 0 refills | Status: DC

## 2017-07-15 MED ORDER — TOPIRAMATE 50 MG PO TABS: 50.0000 mg | ORAL_TABLET | Freq: Every day | ORAL | 2 refills | Status: DC

## 2017-07-15 NOTE — Progress Notes (Signed)
BH MD/PA/NP OP Progress Note  07/15/2017 2:18 PM [name]  MRN:  [id number]  Chief Complaint:  Subjective:  I have been very anxious and nervous.  Too much going on in my life.  HPI: [name] came for her follow-up appointment.  She is taking her medication but continues to have anxiety and chronic depression.  She has lot of health issues.  She continues to have worries about her health and future.  She has headaches and she take over-the-counter medication for headaches.  She is sleeping better.  During the day she is very anxious and nervous.  Recently she received a letter from disability that she may capable to go back to work.  Since that led her she is more anxious and nervous.  Sometimes she feels hopeless and helpless but denies any suicidal thoughts or homicidal thought.  She is watching her appetite and able to lose weight from the last visit.  Patient denies any paranoia or any hallucination.  She denies any suicidal thoughts.  She lives with her husband who is very supportive.  Patient denies drinking alcohol or using any illegal substances.  Her last lithium level was 0.8 which was drawn on April 2018.  Visit Diagnosis:    ICD-10-CM   1. Bipolar I disorder (HCC) F31.9 hydrOXYzine (VISTARIL) 25 MG capsule    risperiDONE (RISPERDAL) 2 MG tablet    topiramate (TOPAMAX) 50 MG tablet    lithium 600 MG capsule    clonazePAM (KLONOPIN) 1 MG tablet    buPROPion (WELLBUTRIN XL) 300 MG 24 hr tablet    Past Psychiatric History: Reviewed. Patient has history of bipolar disorder with multiple psychiatric inpatient treatment. Her last admission was September 2014 for suicidal thinking and hallucination. She was playing with the guns and superficially cut her wrist with a needle. She has history of mania and psychosis . In the past she had tried Zyprexa Seroquel however she is very resistant and reluctant to take any medication that can cause weight gain. She was tried Tegretol however she  developed increased paranoia confusion and she was required hospitalization at behavioral [place].  Past Medical History:  Past Medical History:  Diagnosis Date  . Anxiety   . Bipolar disorder ([place])   . Brain tumor (benign) ([place])   . Depression   . HTN (hypertension)   . Mania ([place])   . Meningioma ([place])   . Migraine     Past Surgical History:  Procedure Laterality Date  . ABDOMINAL HYSTERECTOMY    . BRAIN SURGERY    . INCONTINENCE SURGERY    . INTERSTIM IMPLANT PLACEMENT    . KNEE SURGERY     right x 2  . PLANTAR FASCIECTOMY Left 03/05/16  . TUBAL LIGATION      Family Psychiatric History: Reviewed.  Family History:  Family History  Problem Relation Age of Onset  . Alcohol abuse Father   . Diabetes Father   . Drug abuse Brother   . Mental illness Mother   . Breast cancer Mother   . Dementia Neg Hx   . ADD / ADHD Neg Hx   . Anxiety disorder Neg Hx   . Bipolar disorder Neg Hx   . Depression Neg Hx   . OCD Neg Hx   . Paranoid behavior Neg Hx   . Schizophrenia Neg Hx   . Seizures Neg Hx   . Sexual abuse Neg Hx   . Physical abuse Neg Hx   . Suicidality Neg Hx  Social History:  Social History   Social History  . Marital status: Married    Spouse name: N/A  . Number of children: 2  . Years of education: HS   Occupational History  . Disabled    Social History Main Topics  . Smoking status: Never Smoker  . Smokeless tobacco: Never Used  . Alcohol use No  . Drug use: No  . Sexual activity: No   Other Topics Concern  . None   Social History Narrative   Lives at home with husband.   Right-handed.   2 cups caffeine per day.    Allergies:  Allergies  Allergen Reactions  . Gabapentin Anaphylaxis  . Oxcarbazepine Anaphylaxis  . Tegretol [Carbamazepine] Nausea And Vomiting  . Codeine Nausea Only  . Divalproex Sodium Swelling  . Erythromycin Nausea And Vomiting  . Phenytoin Sodium Extended Swelling  . Phenytoin Sodium Extended Rash     Metabolic Disorder Labs: Lab Results  Component Value Date   HGBA1C 5.0 05/24/2016   MPG 97 05/24/2016   MPG 91 03/16/2015   No results found for: PROLACTIN Lab Results  Component Value Date   CHOL 174 05/13/2013   TRIG 48 05/13/2013   HDL 69 05/13/2013   CHOLHDL 2.5 05/13/2013   VLDL 10 05/13/2013   LDLCALC 95 05/13/2013     Current Medications: Current Outpatient Prescriptions  Medication Sig Dispense Refill  . aspirin-acetaminophen-caffeine (EXCEDRIN MIGRAINE) 250-250-65 MG tablet Take 2 tablets by mouth every 6 (six) hours as needed for migraine.    Marland Kitchen buPROPion (WELLBUTRIN XL) 300 MG 24 hr tablet TAKE ONE TABLET BY MOUTH EVERY MORINING FOR DEPRESSION 30 tablet 2  . clonazePAM (KLONOPIN) 1 MG tablet TAKE ONE -TWO TABLETS AT BEDTIME 62 tablet 2  . hydrOXYzine (VISTARIL) 25 MG capsule Take 1 capsule (25 mg total) by mouth at bedtime. 30 capsule 2  . ibuprofen (ADVIL,MOTRIN) 800 MG tablet Take 800 mg by mouth 3 (three) times daily.    Marland Kitchen lithium 600 MG capsule Take 1 capsule (600 mg total) by mouth 3 (three) times daily with meals. 90 capsule 2  . metoprolol succinate (TOPROL-XL) 25 MG 24 hr tablet Take 12.5 mg by mouth daily.     . ondansetron (ZOFRAN ODT) 4 MG disintegrating tablet Take 1 tablet (4 mg total) by mouth every 8 (eight) hours as needed. 20 tablet 6  . pantoprazole (PROTONIX) 40 MG tablet Take 40 mg by mouth daily.    . risperiDONE (RISPERDAL) 2 MG tablet TAKE ONE TABLET BY MOUTH AT BEDTIME AS NEEDED FOR MOOD CONTROL 90 tablet 0  . topiramate (TOPAMAX) 50 MG tablet Take 1 tablet (50 mg total) by mouth daily. 30 tablet 2   No current facility-administered medications for this visit.     Neurologic: Headache: Yes Seizure: No Paresthesias: No  Musculoskeletal: Strength & Muscle Tone: within normal limits Gait & Station: normal Patient leans: N/A  Psychiatric Specialty Exam: ROS  Blood pressure 126/74, pulse 88, height 5\' 3"  (1.6 m), weight 190 lb 12.8  oz (86.5 kg).Body mass index is 33.8 kg/m.  General Appearance: Casual  Eye Contact:  Fair  Speech:  Slow  Volume:  Decreased  Mood:  Anxious  Affect:  Constricted  Thought Process:  Goal Directed  Orientation:  Full (Time, Place, and Person)  Thought Content: Logical and Rumination   Suicidal Thoughts:  No  Homicidal Thoughts:  No  Memory:  Immediate;   Good Recent;   Good Remote;   Good  Judgement:  Good  Insight:  Good  Psychomotor Activity:  Normal  Concentration:  Concentration: Fair and Attention Span: Fair  Recall:  Good  Fund of Knowledge: Good  Language: Good  Akathisia:  No  Handed:  Right  AIMS (if indicated):  0  Assets:  Communication Skills Desire for Improvement Housing Resilience Social Support  ADL's:  Intact  Cognition: WNL  Sleep:  Fair     Assessment: Bipolar disorder with psychosis.  Anxiety disorder NOS.  Plan: Reassurance given.  Recommended to increase Vistaril 25 mg twice a day to help anxiety and nervousness.  Encouraged to watch her calorie intake.  She had lost weight from the past visit.  Continue Risperdal 2 mg at bedtime, Wellbutrin XL 300 mg daily, Topamax 50 mg at bedtime, lithium 1800 mg daily and Klonopin 1 mg twice a day.  Encouraged to keep appointment with neurologist for headaches.  At this time patient is unable to go back to work due to her chronic illness.  Recommended to call us back if she has any question or any concern.  Follow-up in 3 months.  Ladarius Seubert T., MD 07/15/2017, 2:18 PM

## 2017-07-30 ENCOUNTER — Other Ambulatory Visit (HOSPITAL_COMMUNITY): Payer: Self-pay | Admitting: Psychiatry

## 2017-07-30 DIAGNOSIS — F319 Bipolar disorder, unspecified: Secondary | ICD-10-CM

## 2017-09-15 ENCOUNTER — Telehealth (HOSPITAL_COMMUNITY): Payer: Self-pay

## 2017-09-15 NOTE — Telephone Encounter (Signed)
Medication management - Telephone message left for pt. to follow up on message received from [facility] delivery requesting an order for pt. Requested pt. call back to verify she needed this order to request from Dr. Adele Schilder.

## 2017-09-16 ENCOUNTER — Telehealth (HOSPITAL_COMMUNITY): Payer: Self-pay | Admitting: *Deleted

## 2017-09-16 DIAGNOSIS — F319 Bipolar disorder, unspecified: Secondary | ICD-10-CM

## 2017-09-16 NOTE — Telephone Encounter (Signed)
[facility] phamacy called requesting 90 day refill request. Called patient to ask if she is wanting to switch pharmacies before her next appointment. Patient states that she will not have enough medication until her next appointment and wants to go to mail order with [facility] for her Risperidal, Topamax, and Lithium due to decreased cost.

## 2017-09-17 MED ORDER — RISPERIDONE 2 MG PO TABS: ORAL_TABLET | ORAL | 0 refills | Status: DC

## 2017-09-17 MED ORDER — TOPIRAMATE 50 MG PO TABS: 50.0000 mg | ORAL_TABLET | Freq: Every day | ORAL | 0 refills | Status: DC

## 2017-09-17 MED ORDER — LITHIUM CARBONATE 600 MG PO CAPS: 600.0000 mg | ORAL_CAPSULE | Freq: Three times a day (TID) | ORAL | 0 refills | Status: DC

## 2017-09-17 NOTE — Telephone Encounter (Signed)
Met with Dr. Adele Schilder who approved a one time 90 day order for patient's prescribed Lithium, Risperdal and Topamax to be sent to [facility].  All 3 orders completed as verbally authorized and sent to [place] this date for patient. Called patient to inform the 3 medication 90 day orders she wanted to go to [place] were sent this date.  Collateral to call back if any problems getting medications.

## 2017-09-17 NOTE — Addendum Note (Signed)
Addended by: Watt Climes on: 09/17/2017 12:09 PM   Modules accepted: Orders

## 2017-10-16 ENCOUNTER — Other Ambulatory Visit (HOSPITAL_COMMUNITY): Payer: Self-pay | Admitting: Psychiatry

## 2017-10-16 ENCOUNTER — Other Ambulatory Visit (HOSPITAL_COMMUNITY): Payer: Self-pay

## 2017-10-16 DIAGNOSIS — F319 Bipolar disorder, unspecified: Secondary | ICD-10-CM

## 2017-10-16 MED ORDER — CLONAZEPAM 1 MG PO TABS: ORAL_TABLET | ORAL | 0 refills | Status: DC

## 2017-10-28 ENCOUNTER — Ambulatory Visit (INDEPENDENT_AMBULATORY_CARE_PROVIDER_SITE_OTHER): Payer: Medicare PPO | Admitting: Psychiatry

## 2017-10-28 ENCOUNTER — Encounter (HOSPITAL_COMMUNITY): Payer: Self-pay | Admitting: Psychiatry

## 2017-10-28 DIAGNOSIS — Z811 Family history of alcohol abuse and dependence: Secondary | ICD-10-CM | POA: Diagnosis not present

## 2017-10-28 DIAGNOSIS — Z818 Family history of other mental and behavioral disorders: Secondary | ICD-10-CM | POA: Diagnosis not present

## 2017-10-28 DIAGNOSIS — F419 Anxiety disorder, unspecified: Secondary | ICD-10-CM | POA: Diagnosis not present

## 2017-10-28 DIAGNOSIS — F319 Bipolar disorder, unspecified: Secondary | ICD-10-CM

## 2017-10-28 DIAGNOSIS — Z813 Family history of other psychoactive substance abuse and dependence: Secondary | ICD-10-CM | POA: Diagnosis not present

## 2017-10-28 MED ORDER — RISPERIDONE 2 MG PO TABS: ORAL_TABLET | ORAL | 0 refills | Status: DC

## 2017-10-28 MED ORDER — LITHIUM CARBONATE 600 MG PO CAPS: 600.0000 mg | ORAL_CAPSULE | Freq: Three times a day (TID) | ORAL | 0 refills | Status: DC

## 2017-10-28 MED ORDER — TOPIRAMATE 50 MG PO TABS: 50.0000 mg | ORAL_TABLET | Freq: Every day | ORAL | 0 refills | Status: DC

## 2017-10-28 MED ORDER — HYDROXYZINE PAMOATE 25 MG PO CAPS: ORAL_CAPSULE | ORAL | 2 refills | Status: DC

## 2017-10-28 MED ORDER — BUPROPION HCL ER (XL) 300 MG PO TB24: ORAL_TABLET | ORAL | 2 refills | Status: DC

## 2017-10-28 MED ORDER — CLONAZEPAM 1 MG PO TABS: ORAL_TABLET | ORAL | 2 refills | Status: DC

## 2017-10-28 NOTE — Progress Notes (Signed)
BH MD/PA/NP OP Progress Note  10/28/2017 1:58 PM [name]  MRN:  [id number]  Chief Complaint: I am doing better.  Sometimes I am sleeping too much but my depression is under control.  HPI: [name] came for her follow-up appointment.  She is compliant with medication denies any side effects.  Sometimes she has sleeping too much but she feels her mood is stable.  She is excited because her son got engaged but also sad because they are doing destination wedding in [place] next year.  She may not able to go up because of finances.  Her physical health is also getting better.  In August she has fibroadenoma removed from her right breast.  She is feeling better.  She has no more headaches.  She denies any irritability, anger, mania or any psychosis.  She sees her mother once month.  She is also pleased that her disability issues got resolved.  She denies any paranoia, hallucination, suicidal thoughts or homicidal thoughts.  She lives with her husband who is very supportive.  Patient denies drinking alcohol or using any illegal substances.  She wants to continue her current psychiatric medication.  She takes Vistaril 1-2 capsules at bedtime for insomnia.  She has no tremors, shakes or any EPS.  Her appetite is okay  Visit Diagnosis:    ICD-10-CM   1. Bipolar I disorder (HCC) F31.9 hydrOXYzine (VISTARIL) 25 MG capsule    clonazePAM (KLONOPIN) 1 MG tablet    buPROPion (WELLBUTRIN XL) 300 MG 24 hr tablet    lithium 600 MG capsule    risperiDONE (RISPERDAL) 2 MG tablet    topiramate (TOPAMAX) 50 MG tablet    DISCONTINUED: topiramate (TOPAMAX) 50 MG tablet    Past Psychiatric History: Reviewed. Patient has history of bipolar disorder with multiple psychiatric inpatient treatment. Her last admission was September 2014 for suicidal thinking and hallucination. She was playing with the guns and superficially cut her wrist with a needle. She has history of mania and psychosis . In the past she had tried  Zyprexa Seroquel however she is very resistant and reluctant to take any medication that can cause weight gain. She was tried Tegretol however she developed increased paranoia confusion and she was required hospitalization at behavioral [facility].   Past Medical History:  Past Medical History:  Diagnosis Date  . Anxiety   . Bipolar disorder ([place])   . Brain tumor (benign) ([place])   . Depression   . HTN (hypertension)   . Mania ([place])   . Meningioma ([place])   . Migraine     Past Surgical History:  Procedure Laterality Date  . ABDOMINAL HYSTERECTOMY    . BRAIN SURGERY    . breast tumor removal    . INCONTINENCE SURGERY    . INTERSTIM IMPLANT PLACEMENT    . KNEE SURGERY     right x 2  . PLANTAR FASCIECTOMY Left 03/05/16  . TUBAL LIGATION      Family Psychiatric History: Reviewed.  Family History:  Family History  Problem Relation Age of Onset  . Alcohol abuse Father   . Diabetes Father   . Drug abuse Brother   . Mental illness Mother   . Breast cancer Mother   . Dementia Neg Hx   . ADD / ADHD Neg Hx   . Anxiety disorder Neg Hx   . Bipolar disorder Neg Hx   . Depression Neg Hx   . OCD Neg Hx   . Paranoid behavior Neg Hx   .  Schizophrenia Neg Hx   . Seizures Neg Hx   . Sexual abuse Neg Hx   . Physical abuse Neg Hx   . Suicidality Neg Hx     Social History:  Social History   Socioeconomic History  . Marital status: Married    Spouse name: None  . Number of children: 2  . Years of education: HS  . Highest education level: None  Social Needs  . Financial resource strain: Not very hard  . Food insecurity - worry: Never true  . Food insecurity - inability: Never true  . Transportation needs - medical: No  . Transportation needs - non-medical: No  Occupational History  . Occupation: Disabled  Tobacco Use  . Smoking status: Never Smoker  . Smokeless tobacco: Never Used  Substance and Sexual Activity  . Alcohol use: No    Alcohol/week: 0.0 oz  . Drug use:  No  . Sexual activity: No    Birth control/protection: None  Other Topics Concern  . None  Social History Narrative   Lives at home with husband.   Right-handed.   2 cups caffeine per day.    Allergies:  Allergies  Allergen Reactions  . Gabapentin Anaphylaxis  . Oxcarbazepine Anaphylaxis  . Tegretol [Carbamazepine] Nausea And Vomiting  . Codeine Nausea Only  . Divalproex Sodium Swelling  . Erythromycin Nausea And Vomiting  . Phenytoin Sodium Extended Swelling  . Phenytoin Sodium Extended Rash    Metabolic Disorder Labs: Lab Results  Component Value Date   HGBA1C 5.0 05/24/2016   MPG 97 05/24/2016   MPG 91 03/16/2015   No results found for: PROLACTIN Lab Results  Component Value Date   CHOL 174 05/13/2013   TRIG 48 05/13/2013   HDL 69 05/13/2013   CHOLHDL 2.5 05/13/2013   VLDL 10 05/13/2013   LDLCALC 95 05/13/2013   Lab Results  Component Value Date   TSH 1.79 05/24/2016   TSH 1.268 11/17/2012    Therapeutic Level Labs: Lab Results  Component Value Date   LITHIUM 0.8 04/01/2017   LITHIUM 0.70 (L) 05/24/2016   Lab Results  Component Value Date   VALPROATE <10.0 (L) 09/09/2013   No components found for:  CBMZ  Current Medications: Current Outpatient Medications  Medication Sig Dispense Refill  . aspirin-acetaminophen-caffeine (EXCEDRIN MIGRAINE) 250-250-65 MG tablet Take 2 tablets by mouth every 6 (six) hours as needed for migraine.    Marland Kitchen buPROPion (WELLBUTRIN XL) 300 MG 24 hr tablet TAKE ONE TABLET BY MOUTH EVERY MORINING FOR DEPRESSION 30 tablet 2  . clonazePAM (KLONOPIN) 1 MG tablet TAKE ONE -TWO TABLETS AT BEDTIME 62 tablet 0  . hydrOXYzine (VISTARIL) 25 MG capsule Take 1 capsule (25 mg total) by mouth 2 (two) times daily. 60 capsule 2  . ibuprofen (ADVIL,MOTRIN) 800 MG tablet Take 800 mg by mouth 3 (three) times daily.    Marland Kitchen lithium 600 MG capsule Take 1 capsule (600 mg total) by mouth 3 (three) times daily with meals. 270 capsule 0  . metoprolol  succinate (TOPROL-XL) 25 MG 24 hr tablet Take 12.5 mg by mouth daily.     . ondansetron (ZOFRAN ODT) 4 MG disintegrating tablet Take 1 tablet (4 mg total) by mouth every 8 (eight) hours as needed. 20 tablet 6  . risperiDONE (RISPERDAL) 2 MG tablet TAKE ONE TABLET BY MOUTH AT BEDTIME AS NEEDED FOR MOOD CONTROL 90 tablet 0  . topiramate (TOPAMAX) 50 MG tablet Take 1 tablet (50 mg total) by mouth daily. [place]  tablet 0  . pantoprazole (PROTONIX) 40 MG tablet Take 40 mg by mouth daily.     No current facility-administered medications for this visit.      Musculoskeletal: Strength & Muscle Tone: within normal limits Gait & Station: normal Patient leans: N/A  Psychiatric Specialty Exam: ROS  Blood pressure 126/74, pulse 86, height 5\' 3"  (1.6 m), weight 200 lb 6.4 oz (90.9 kg).Body mass index is 35.5 kg/m.  General Appearance: Casual  Eye Contact:  Good  Speech:  Clear and Coherent  Volume:  Normal  Mood:  Euthymic  Affect:  Congruent  Thought Process:  Goal Directed  Orientation:  Full (Time, Place, and Person)  Thought Content: Logical   Suicidal Thoughts:  No  Homicidal Thoughts:  No  Memory:  Immediate;   Good Recent;   Good Remote;   Good  Judgement:  Good  Insight:  Good  Psychomotor Activity:  Normal  Concentration:  Concentration: Fair and Attention Span: Fair  Recall:  Good  Fund of Knowledge: Good  Language: Good  Akathisia:  No  Handed:  Right  AIMS (if indicated): not done  Assets:  Communication Skills Desire for Improvement Resilience Social Support  ADL's:  Intact  Cognition: WNL  Sleep:  Fair   Screenings: AUDIT     Admission (Discharged) from 09/08/2013 in [facility]  Alcohol Use Disorder Identification Test Final Score (AUDIT)  0       Assessment and Plan: Bipolar disorder with psychotic features.  Anxiety disorder NOS.  Patient doing better on her current psychiatric medication.  Continue Vistaril 25 mg 1-2  capsules at bedtime for insomnia and anxiety.  Continue Risperdal 2 mg at bedtime, Wellbutrin XL 300 mg daily, Topamax 50 mg at bedtime, lithium 1 mg twice a day and lithium 1800 mg daily.  Her last lithium level was 0.8 done in April 2018.  Discussed medication side effects and benefits.  Recommended to call us back if she has any question, concerns or if she feels worsening of the symptoms.  Follow-up in 3 months.   Merilee Wible T., MD 10/28/2017, 1:58 PM

## 2017-11-06 ENCOUNTER — Telehealth (HOSPITAL_COMMUNITY): Payer: Self-pay

## 2017-11-06 NOTE — Telephone Encounter (Signed)
Patient called and said that she did not sleep at all last night and would like to know what to do. Please review and advise, thank you

## 2017-11-10 ENCOUNTER — Telehealth (HOSPITAL_COMMUNITY): Payer: Self-pay

## 2017-11-10 NOTE — Telephone Encounter (Signed)
Medication refill request - Fax received from pt's [facility] requesting a 90 day order for Bupropion XL 300 mg, last ordered 10/28/17 #30 + 2 refills.

## 2017-11-10 NOTE — Telephone Encounter (Signed)
Medication refill request - Fax received from pt's [facility] also requesting a 90 day order for Hydroxyzine last ordered 10/28/17 #60 + 2 refills.

## 2017-11-11 ENCOUNTER — Other Ambulatory Visit (HOSPITAL_COMMUNITY): Payer: Self-pay | Admitting: Psychiatry

## 2017-11-11 DIAGNOSIS — F319 Bipolar disorder, unspecified: Secondary | ICD-10-CM

## 2017-11-11 MED ORDER — HYDROXYZINE PAMOATE 25 MG PO CAPS: ORAL_CAPSULE | ORAL | 0 refills | Status: DC

## 2017-11-11 NOTE — Telephone Encounter (Signed)
Call returned.  I spoke to the patient.  She is difficulty sleeping the night.  She mentioned that she is sleeping during the day and taking a nap but when night, she has difficulty falling asleep.  Discussed sleep hygiene.  Recommended to avoid taking nap during the day.  Recommended to continue Vistaril 25 mg 1 capsule every night which has been helping but lately she is stopped.  Recommended to restart Vistaril 25 mg every night to help insomnia.  Recommended to call us back if she has any further questions.

## 2017-12-16 ENCOUNTER — Emergency Department (HOSPITAL_COMMUNITY): Payer: Medicare PPO

## 2017-12-16 ENCOUNTER — Emergency Department (HOSPITAL_COMMUNITY)
Admission: EM | Admit: 2017-12-16 | Discharge: 2017-12-16 | Disposition: A | Discharge status: Home / Self Care | Payer: Medicare PPO | Attending: Emergency Medicine | Admitting: Emergency Medicine

## 2017-12-16 ENCOUNTER — Encounter (HOSPITAL_COMMUNITY): Payer: Self-pay | Admitting: Emergency Medicine

## 2017-12-16 DIAGNOSIS — D332 Benign neoplasm of brain, unspecified: Secondary | ICD-10-CM | POA: Diagnosis not present

## 2017-12-16 DIAGNOSIS — Z79899 Other long term (current) drug therapy: Secondary | ICD-10-CM | POA: Diagnosis not present

## 2017-12-16 DIAGNOSIS — F319 Bipolar disorder, unspecified: Secondary | ICD-10-CM | POA: Diagnosis not present

## 2017-12-16 DIAGNOSIS — R200 Anesthesia of skin: Secondary | ICD-10-CM | POA: Diagnosis present

## 2017-12-16 DIAGNOSIS — D329 Benign neoplasm of meninges, unspecified: Secondary | ICD-10-CM | POA: Insufficient documentation

## 2017-12-16 DIAGNOSIS — G43909 Migraine, unspecified, not intractable, without status migrainosus: Secondary | ICD-10-CM | POA: Diagnosis not present

## 2017-12-16 DIAGNOSIS — I10 Essential (primary) hypertension: Secondary | ICD-10-CM | POA: Insufficient documentation

## 2017-12-16 DIAGNOSIS — G43109 Migraine with aura, not intractable, without status migrainosus: Secondary | ICD-10-CM

## 2017-12-16 LAB — COMPREHENSIVE METABOLIC PANEL
ALT: 13 U/L — ABNORMAL LOW (ref 14–54)
AST: 14 U/L — ABNORMAL LOW (ref 15–41)
Albumin: 4 g/dL (ref 3.5–5.0)
Alkaline Phosphatase: 100 U/L (ref 38–126)
Anion gap: 7 (ref 5–15)
BUN: 13 mg/dL (ref 6–20)
CO2: 25 mmol/L (ref 22–32)
Calcium: 9.7 mg/dL (ref 8.9–10.3)
Chloride: 108 mmol/L (ref 101–111)
Creatinine, Ser: 1.15 mg/dL — ABNORMAL HIGH (ref 0.44–1.00)
GFR calc Af Amer: >60 mL/min (ref >60)
GFR calc non Af Amer: 53 mL/min — ABNORMAL LOW (ref >60)
Glucose, Bld: 96 mg/dL (ref 65–99)
Potassium: 4.1 mmol/L (ref 3.5–5.1)
Sodium: 140 mmol/L (ref 135–145)
Total Bilirubin: 0.8 mg/dL (ref 0.3–1.2)
Total Protein: 6.9 g/dL (ref 6.5–8.1)

## 2017-12-16 LAB — CBC WITH DIFFERENTIAL/PLATELET
Basophils Absolute: 0 10*3/uL (ref 0.0–0.1)
Basophils Relative: 0 %
Eosinophils Absolute: 0.1 10*3/uL (ref 0.0–0.7)
Eosinophils Relative: 1 %
HCT: 42.8 % (ref 36.0–46.0)
Hemoglobin: 14.2 g/dL (ref 12.0–15.0)
Lymphocytes Relative: 26 %
Lymphs Abs: 1.8 10*3/uL (ref 0.7–4.0)
MCH: 30.6 pg (ref 26.0–34.0)
MCHC: 33.2 g/dL (ref 30.0–36.0)
MCV: 92.2 fL (ref 78.0–100.0)
Monocytes Absolute: 0.5 10*3/uL (ref 0.1–1.0)
Monocytes Relative: 7 %
Neutro Abs: 4.6 10*3/uL (ref 1.7–7.7)
Neutrophils Relative %: 66 %
Platelets: 256 10*3/uL (ref 150–400)
RBC: 4.64 MIL/uL (ref 3.87–5.11)
RDW: 12.6 % (ref 11.5–15.5)
WBC: 7 10*3/uL (ref 4.0–10.5)

## 2017-12-16 LAB — LITHIUM LEVEL: Lithium Lvl: 0.35 mmol/L — ABNORMAL LOW (ref 0.60–1.20)

## 2017-12-16 MED ORDER — KETOROLAC TROMETHAMINE 60 MG/2ML IM SOLN
60.0000 mg | Freq: Once | INTRAMUSCULAR | Status: AC
  Administered 2017-12-16: 60 mg via INTRAMUSCULAR
  Filled 2017-12-16: qty 2

## 2017-12-16 MED ORDER — PROCHLORPERAZINE EDISYLATE 5 MG/ML IJ SOLN
10.0000 mg | Freq: Once | INTRAMUSCULAR | Status: AC
  Administered 2017-12-16: 10 mg via INTRAMUSCULAR
  Filled 2017-12-16: qty 2

## 2017-12-16 MED ORDER — IBUPROFEN 800 MG PO TABS: 800.0000 mg | ORAL_TABLET | Freq: Three times a day (TID) | ORAL | 0 refills | Status: DC

## 2017-12-16 NOTE — ED Provider Notes (Signed)
[facility] EMERGENCY DEPARTMENT Provider Note   CSN: [id number] Arrival date & time: 12/16/17  1234     History   Chief Complaint Chief Complaint  Patient presents with  . Numbness    HPI [name] is a 56 y.o. female.  HPI  The pt is a 56 y/o female, hx of Bipolar disorder, she also has a history of a meningioma and according to an MRI from 10/2016, and MRI showed mild enhancement of the left frontal dural nodule which was seen on a prior CT scan.  There was a residual or recurrent meningioma is a possibility in that same location, they also noted a second smaller nodule.  The patient denies that she has had any chronic neurologic symptoms.  She reports that during breakfast today she developed a "drawing" sensation of her face feeling like her face was being pulled forward, this was both sides of her cheeks and her lips, there was no change in sensation, no change in speech or vision, no facial droop or difficulty with coordination and no numbness or weakness of her arms or her legs.  Her husband who accompanies her states that she appears to be her normal self with speech and appearance, he has not noticed anything different at all.  The patient states that she has had her watch signal her a couple of times this week that she was having a heart rate above 130, she has no history of palpitations or arrhythmias.  The patient denies any feeling of palpitations as well.  This feeling of her face pulling off of her face is persistent over the last couple of hours, it is mild and again there is no other symptoms including chest pain shortness of breath abdominal pain nausea vomiting or diarrhea.  He has not started any new medications.  Past Medical History:  Diagnosis Date  . Anxiety   . Bipolar disorder ([place])   . Brain tumor (benign) ([place])   . Depression   . HTN (hypertension)   . Mania ([place])   . Meningioma ([place])   . Migraine     Patient Active Problem List   Diagnosis Date  Noted  . Chronic migraine 11/06/2016  . Hx of resection of meningioma 10/07/2016  . Bipolar 1 disorder, depressed, moderate ([place]) 11/14/2012    Class: Chronic  . 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 ABDOMINAL HYSTERECTOMY    . BRAIN SURGERY    . breast tumor removal    . INCONTINENCE SURGERY    . INTERSTIM IMPLANT PLACEMENT    . KNEE SURGERY     right x 2  . PLANTAR FASCIECTOMY Left 03/05/16  . TUBAL LIGATION      OB History    Gravida Para Term Preterm AB Living   2 2 2     2    SAB TAB Ectopic Multiple Live Births                   Home Medications    Prior to Admission medications   Medication Sig Start Date End Date Taking? Authorizing Provider  aspirin-acetaminophen-caffeine (EXCEDRIN MIGRAINE) [phone] MG tablet Take 2 tablets by mouth every 6 (six) hours as needed for migraine.   Yes [provider]  buPROPion (WELLBUTRIN XL) 300 MG 24 hr tablet TAKE ONE TABLET BY MOUTH EVERY MORINING FOR DEPRESSION 10/28/17  Arfeen, Arlyce Harman, MD  clonazePAM (KLONOPIN) 1 MG tablet TAKE ONE -TWO TABLETS AT BEDTIME 10/28/17   Arfeen, Arlyce Harman, MD  hydrOXYzine (VISTARIL) 25 MG capsule Take 1-2 capsule at bed time 11/11/17   Arfeen, Arlyce Harman, MD  ibuprofen (ADVIL,MOTRIN) 800 MG tablet Take 1 tablet (800 mg total) by mouth 3 (three) times daily. 12/16/17   Noemi Chapel, MD  lithium 600 MG capsule Take 1 capsule (600 mg total) 3 (three) times daily with meals by mouth. 10/28/17   Arfeen, Arlyce Harman, MD  metoprolol succinate (TOPROL-XL) 25 MG 24 hr tablet Take 12.5 mg by mouth daily.  07/22/16   [provider]  ondansetron (ZOFRAN ODT) 4 MG disintegrating tablet Take 1 tablet (4 mg total) by mouth every 8 (eight) hours as needed. 10/07/16   Marcial Pacas, MD  pantoprazole (PROTONIX) 40 MG tablet Take 40 mg by mouth daily. 03/03/17   [provider]    risperiDONE (RISPERDAL) 2 MG tablet TAKE ONE TABLET BY MOUTH AT BEDTIME AS NEEDED FOR MOOD CONTROL 10/28/17   Arfeen, Arlyce Harman, MD  topiramate (TOPAMAX) 50 MG tablet Take 1 tablet (50 mg total) daily by mouth. 10/28/17   Arfeen, Arlyce Harman, MD    Family History Family History  Problem Relation Age of Onset  . Alcohol abuse Father   . Diabetes Father   . Drug abuse Brother   . Mental illness Mother   . Breast cancer Mother   . Dementia Neg Hx   . ADD / ADHD Neg Hx   . Anxiety disorder Neg Hx   . Bipolar disorder Neg Hx   . Depression Neg Hx   . OCD Neg Hx   . Paranoid behavior Neg Hx   . Schizophrenia Neg Hx   . Seizures Neg Hx   . Sexual abuse Neg Hx   . Physical abuse Neg Hx   . Suicidality Neg Hx     Social History Social History   Tobacco Use  . Smoking status: Never Smoker  . Smokeless tobacco: Never Used  Substance Use Topics  . Alcohol use: No    Alcohol/week: 0.0 oz  . Drug use: No     Allergies   Gabapentin; Oxcarbazepine; Tegretol [carbamazepine]; Codeine; Divalproex sodium; Erythromycin; Phenytoin sodium extended; and Phenytoin sodium extended   Review of Systems Review of Systems  All other systems reviewed and are negative.    Physical Exam Updated Vital Signs BP (!) 128/91   Pulse 80   Temp 98.4 F (36.9 C) (Oral)   Resp 15   Ht 5\' 3"  (1.6 m)   Wt 90.3 kg (199 lb)   SpO2 99%   BMI 35.25 kg/m   Physical Exam  Constitutional: She appears well-developed and well-nourished. No distress.  HENT:  Head: Normocephalic and atraumatic.  Mouth/Throat: Oropharynx is clear and moist. No oropharyngeal exudate.  Eyes: Conjunctivae and EOM are normal. Pupils are equal, round, and reactive to light. Right eye exhibits no discharge. Left eye exhibits no discharge. No scleral icterus.  Neck: Normal range of motion. Neck supple. No JVD present. No thyromegaly present.  Cardiovascular: Normal rate, regular rhythm, normal heart sounds and intact distal pulses.  Exam reveals no gallop and no friction rub.  No murmur heard. Pulmonary/Chest: Effort normal and breath sounds normal. No respiratory distress. She has no wheezes. She has no rales.  Abdominal: Soft. Bowel sounds are normal. She exhibits no distension and no mass. There is no tenderness.  Musculoskeletal: Normal range of  motion. She exhibits no edema or tenderness.  Lymphadenopathy:    She has no cervical adenopathy.  Neurological: She is alert. Coordination normal.  Speech is clear, cranial nerves III through XII are intact, memory is intact, strength is normal in all 4 extremities including grips, sensation is intact to light touch and pinprick in all 4 extremities. Coordination as tested by finger-nose-finger is normal, no limb ataxia. Normal gait, normal reflexes at the patellar tendons bilaterally  Skin: Skin is warm and dry. No rash noted. No erythema.  Psychiatric: She has a normal mood and affect. Her behavior is normal.  Nursing note and vitals reviewed.    ED Treatments / Results  Labs (all labs ordered are listed, but only abnormal results are displayed) Labs Reviewed  COMPREHENSIVE METABOLIC PANEL - Abnormal; Notable for the following components:      Result Value   Creatinine, Ser 1.15 (*)    AST 14 (*)    ALT 13 (*)    GFR calc non Af Amer 53 (*)    All other components within normal limits  CBC WITH DIFFERENTIAL/PLATELET    ED ECG REPORT  I personally interpreted this EKG   Date: 12/16/2017   Rate: 84  Rhythm: normal sinus rhythm  QRS Axis: normal  Intervals: normal  ST/T Wave abnormalities: normal  Conduction Disutrbances:none  Narrative Interpretation:   Old EKG Reviewed: unchanged  Radiology Ct Head Wo Contrast  Result Date: 12/16/2017 CLINICAL DATA:  Pt states she began having bilateral face "drawing" at 11 am today. Pt has hx of known small meningioma . Another meningioma was removed in 2000. Hx of migraine, HTN EXAM: CT HEAD WITHOUT CONTRAST  TECHNIQUE: Contiguous axial images were obtained from the base of the skull through the vertex without intravenous contrast. COMPARISON:  Brain MRI, 11/21/2016.  Head CT, 10/21/2016. FINDINGS: Brain: No evidence of acute infarction, hemorrhage, hydrocephalus, extra-axial collection or mass lesion/mass effect. Vascular: No hyperdense vessel or unexpected calcification. Skull: Old left frontotemporal craniotomy, stable from the prior exams. No skull fracture or lesion. Sinuses/Orbits: Visualize globes and orbits are unremarkable. Visualized sinuses and mastoid air cells are clear. Other: None. IMPRESSION: 1. No intracranial abnormality. 2. Stable changes from a left frontotemporal craniotomy. Electronically Signed   By: Lajean Manes M.D.   On: 12/16/2017 13:41    Procedures Procedures (including critical care time)  Medications Ordered in ED Medications  prochlorperazine (COMPAZINE) injection 10 mg (10 mg Intramuscular Given 12/16/17 1436)  ketorolac (TORADOL) injection 60 mg (60 mg Intramuscular Given 12/16/17 1436)     Initial Impression / Assessment and Plan / ED Course  I have reviewed the triage vital signs and the nursing notes.  Pertinent labs & imaging results that were available during my care of the patient were reviewed by me and considered in my medical decision making (see chart for details).    Though the patient has this abnormal sensation of her face her exam is totally normal with sensation to both light touch and pinprick as well as a cranial nerves III through XII, gross visual acuity and strength and sensation in all 4 extremities.  Coordination is normal, the patient does have underlying mental health disorders but also could have some underlying migrainous type history as well.  She has had a normal appetite this morning, she reports a possible arrhythmia based on a electronic device that she wears on her wrist, will check a EKG, CT scan of the brain secondary to prior  meningioma and lab  work to rule out hypokalemia or other metabolic disturbance.  I don' t detect any focal findings which would suggest a need to activate a code stroke.  CT scan is negative, labs are unremarkable and the EKG is normal as well.  The patient was reevaluated and states that she is feeling better with regards to her face however she says she does have a headache now and according to the patient has had some numbness with headaches in the past.  Knowing that the rest of her workup is negative and she has no further neurologic complaints that are new we will treat this as a possible complicated migraine.  The patient is in agreement with this plan and will receive intramuscular Compazine and Toradol prior to discharge.  She is aware of the indications for return  Patient is feeling better, lithium level came back at 0.35, she is not toxic on this and her renal function is preserved, stable for discharge  Final Clinical Impressions(s) / ED Diagnoses   Final diagnoses:  Complicated migraine    ED Discharge Orders        Ordered    ibuprofen (ADVIL,MOTRIN) 800 MG tablet  3 times daily     12/16/17 1410    Lithium level     12/16/17 1410       Noemi Chapel, MD 12/16/17 1549

## 2017-12-16 NOTE — ED Triage Notes (Signed)
Pt reports it felt like her face began to become numb around 11am.  Bilateral numbness.  States feels like her glasses are vibrating.  No focal neuro deficit noted.

## 2017-12-16 NOTE — Discharge Instructions (Signed)

## 2018-01-29 ENCOUNTER — Ambulatory Visit (INDEPENDENT_AMBULATORY_CARE_PROVIDER_SITE_OTHER): Payer: Medicare PPO | Admitting: Psychiatry

## 2018-01-29 ENCOUNTER — Encounter (HOSPITAL_COMMUNITY): Payer: Self-pay | Admitting: Psychiatry

## 2018-01-29 ENCOUNTER — Other Ambulatory Visit (HOSPITAL_COMMUNITY): Payer: Self-pay

## 2018-01-29 VITALS — BP 128/74 | HR 103 | Ht 63.0 in | Wt 204.0 lb

## 2018-01-29 DIAGNOSIS — R450 Nervousness: Secondary | ICD-10-CM | POA: Diagnosis not present

## 2018-01-29 DIAGNOSIS — Z915 Personal history of self-harm: Secondary | ICD-10-CM | POA: Diagnosis not present

## 2018-01-29 DIAGNOSIS — Z79899 Other long term (current) drug therapy: Secondary | ICD-10-CM | POA: Diagnosis not present

## 2018-01-29 DIAGNOSIS — Z813 Family history of other psychoactive substance abuse and dependence: Secondary | ICD-10-CM

## 2018-01-29 DIAGNOSIS — Z818 Family history of other mental and behavioral disorders: Secondary | ICD-10-CM

## 2018-01-29 DIAGNOSIS — G4700 Insomnia, unspecified: Secondary | ICD-10-CM

## 2018-01-29 DIAGNOSIS — Z811 Family history of alcohol abuse and dependence: Secondary | ICD-10-CM

## 2018-01-29 DIAGNOSIS — F319 Bipolar disorder, unspecified: Secondary | ICD-10-CM

## 2018-01-29 DIAGNOSIS — F3132 Bipolar disorder, current episode depressed, moderate: Secondary | ICD-10-CM

## 2018-01-29 DIAGNOSIS — F419 Anxiety disorder, unspecified: Secondary | ICD-10-CM | POA: Diagnosis not present

## 2018-01-29 DIAGNOSIS — R51 Headache: Secondary | ICD-10-CM

## 2018-01-29 MED ORDER — RISPERIDONE 2 MG PO TABS: ORAL_TABLET | ORAL | 0 refills | Status: DC

## 2018-01-29 MED ORDER — BUPROPION HCL ER (XL) 300 MG PO TB24: ORAL_TABLET | ORAL | 2 refills | Status: DC

## 2018-01-29 MED ORDER — CLONAZEPAM 1 MG PO TABS: ORAL_TABLET | ORAL | 2 refills | Status: DC

## 2018-01-29 MED ORDER — TOPIRAMATE 50 MG PO TABS: 50.0000 mg | ORAL_TABLET | Freq: Every day | ORAL | 0 refills | Status: DC

## 2018-01-29 MED ORDER — LITHIUM CARBONATE 600 MG PO CAPS: 600.0000 mg | ORAL_CAPSULE | Freq: Three times a day (TID) | ORAL | 0 refills | Status: DC

## 2018-01-29 NOTE — Progress Notes (Signed)
BH MD/PA/NP OP Progress Note  01/29/2018 2:27 PM [name]  MRN:  [id number]  Chief Complaint: I am under a lot of stress.  I am not sleeping.  I am very anxious.  HPI: [name] came for her follow-up appointment.  She admitted since Christmas she has been feeling very stressed anxious and depressed.  She is having issues with her son who is getting married in [place].  Patient does not want to fly there because she is afraid she may get panic attacks on the plane.  She also endorsed irritability, social isolation and irritability.  Around Christmas time after the arguments with her son she wanted to cut herself with a screwdriver but she was able to calm herself.  She was also seen in the emergency room around Christmas time due to numbness in her face.  She has extensive workup to rule out stroke.  She was told she may have migraine.  She was given a cocktail of Toradol injection which helped her numbness.  She also had a blood work which shows lithium level 0.35.  When I ask about her lithium compliant she mentioned that she is taking lithium 2 times a day.  Patient supposed to take lithium 3 times a day but she is taking only twice a day.  She apologized not taking 3 times a day because she forgot the directions.  She is having racing thoughts, anxiety, depression, social isolation.    Visit Diagnosis:    ICD-10-CM   1. Encounter for long-term (current) use of medications I62.703 COMPLETE METABOLIC PANEL WITH GFR    CANCELED: Hemoglobin A1c    CANCELED: Lithium level  2. Bipolar I disorder (HCC) F31.9 clonazePAM (KLONOPIN) 1 MG tablet    buPROPion (WELLBUTRIN XL) 300 MG 24 hr tablet    lithium 600 MG capsule    risperiDONE (RISPERDAL) 2 MG tablet    topiramate (TOPAMAX) 50 MG tablet    DISCONTINUED: lithium 600 MG capsule    DISCONTINUED: topiramate (TOPAMAX) 50 MG tablet    DISCONTINUED: risperiDONE (RISPERDAL) 2 MG tablet    Past Psychiatric History: Reviewed. Patient has history of  bipolar disorder with multiple psychiatric inpatient treatment. Her last admission was September 2014 for suicidal thinking and hallucination. She was playing with the guns and superficially cut her wrist with a needle. She has history of mania and psychosis. In the past she had tried Zyprexa Seroquel however she is very resistant and reluctant to take any medication that can cause weight gain. She was tried Tegretol but caused increased paranoia confusion and she required hospitalization at behavioral [facility].   Past Medical History:  Past Medical History:  Diagnosis Date  . Anxiety   . Bipolar disorder ([place])   . Brain tumor (benign) ([place])   . Depression   . HTN (hypertension)   . Mania ([place])   . Meningioma ([place])   . Migraine     Past Surgical History:  Procedure Laterality Date  . ABDOMINAL HYSTERECTOMY    . BRAIN SURGERY    . breast tumor removal    . INCONTINENCE SURGERY    . INTERSTIM IMPLANT PLACEMENT    . KNEE SURGERY     right x 2  . PLANTAR FASCIECTOMY Left 03/05/16  . TUBAL LIGATION      Family Psychiatric History: Reviewed.  Family History:  Family History  Problem Relation Age of Onset  . Alcohol abuse Father   . Diabetes Father   . Drug abuse Brother   .  Mental illness Mother   . Breast cancer Mother   . Dementia Neg Hx   . ADD / ADHD Neg Hx   . Anxiety disorder Neg Hx   . Bipolar disorder Neg Hx   . Depression Neg Hx   . OCD Neg Hx   . Paranoid behavior Neg Hx   . Schizophrenia Neg Hx   . Seizures Neg Hx   . Sexual abuse Neg Hx   . Physical abuse Neg Hx   . Suicidality Neg Hx     Social History:  Social History   Socioeconomic History  . Marital status: Married    Spouse name: None  . Number of children: 2  . Years of education: HS  . Highest education level: None  Social Needs  . Financial resource strain: Not very hard  . Food insecurity - worry: Never true  . Food insecurity - inability: Never true  . Transportation needs -  medical: No  . Transportation needs - non-medical: No  Occupational History  . Occupation: Disabled  Tobacco Use  . Smoking status: Never Smoker  . Smokeless tobacco: Never Used  Substance and Sexual Activity  . Alcohol use: No    Alcohol/week: 0.0 oz  . Drug use: No  . Sexual activity: No    Birth control/protection: None  Other Topics Concern  . None  Social History Narrative   Lives at home with husband.   Right-handed.   2 cups caffeine per day.    Allergies:  Allergies  Allergen Reactions  . Gabapentin Anaphylaxis  . Oxcarbazepine Anaphylaxis  . Tegretol [Carbamazepine] Nausea And Vomiting  . Codeine Nausea Only  . Divalproex Sodium Swelling  . Erythromycin Nausea And Vomiting  . Phenytoin Sodium Extended Swelling  . Phenytoin Sodium Extended Rash    Metabolic Disorder Labs: Recent Results (from the past 2160 hour(s))  Comprehensive metabolic panel     Status: Abnormal   Collection Time: 12/16/17 12:59 PM  Result Value Ref Range   Sodium 140 135 - 145 mmol/L   Potassium 4.1 3.5 - 5.1 mmol/L   Chloride 108 101 - 111 mmol/L   CO2 25 22 - 32 mmol/L   Glucose, Bld 96 65 - 99 mg/dL   BUN 13 6 - 20 mg/dL   Creatinine, Ser 1.15 (H) 0.44 - 1.00 mg/dL   Calcium 9.7 8.9 - 10.3 mg/dL   Total Protein 6.9 6.5 - 8.1 g/dL   Albumin 4.0 3.5 - 5.0 g/dL   AST 14 (L) 15 - 41 U/L   ALT 13 (L) 14 - 54 U/L   Alkaline Phosphatase 100 38 - 126 U/L   Total Bilirubin 0.8 0.3 - 1.2 mg/dL   GFR calc non Af Amer 53 (L) >60 mL/min   GFR calc Af Amer >60 >60 mL/min    Comment: (NOTE) The eGFR has been calculated using the CKD EPI equation. This calculation has not been validated in all clinical situations. eGFR's persistently <60 mL/min signify possible Chronic Kidney Disease.    Anion gap 7 5 - 15  CBC with Differential/Platelet     Status: None   Collection Time: 12/16/17 12:59 PM  Result Value Ref Range   WBC 7.0 4.0 - 10.5 K/uL   RBC 4.64 3.87 - 5.11 MIL/uL   Hemoglobin  14.2 12.0 - 15.0 g/dL   HCT 42.8 36.0 - 46.0 %   MCV 92.2 78.0 - 100.0 fL   MCH 30.6 26.0 - 34.0 pg   MCHC 33.2 30.0 -  36.0 g/dL   RDW 12.6 11.5 - 15.5 %   Platelets 256 150 - 400 K/uL   Neutrophils Relative % 66 %   Neutro Abs 4.6 1.7 - 7.7 K/uL   Lymphocytes Relative 26 %   Lymphs Abs 1.8 0.7 - 4.0 K/uL   Monocytes Relative 7 %   Monocytes Absolute 0.5 0.1 - 1.0 K/uL   Eosinophils Relative 1 %   Eosinophils Absolute 0.1 0.0 - 0.7 K/uL   Basophils Relative 0 %   Basophils Absolute 0.0 0.0 - 0.1 K/uL  Lithium level     Status: Abnormal   Collection Time: 12/16/17  2:10 PM  Result Value Ref Range   Lithium Lvl 0.35 (L) 0.60 - 1.20 mmol/L   Lab Results  Component Value Date   HGBA1C 5.0 05/24/2016   MPG 97 05/24/2016   MPG 91 03/16/2015   No results found for: PROLACTIN Lab Results  Component Value Date   CHOL 174 05/13/2013   TRIG 48 05/13/2013   HDL 69 05/13/2013   CHOLHDL 2.5 05/13/2013   VLDL 10 05/13/2013   LDLCALC 95 05/13/2013   Lab Results  Component Value Date   TSH 1.79 05/24/2016   TSH 1.268 11/17/2012    Therapeutic Level Labs: Lab Results  Component Value Date   LITHIUM 0.35 (L) 12/16/2017   LITHIUM 0.8 04/01/2017   Lab Results  Component Value Date   VALPROATE <10.0 (L) 09/09/2013   No components found for:  CBMZ  Current Medications: Current Outpatient Medications  Medication Sig Dispense Refill  . aspirin-acetaminophen-caffeine (EXCEDRIN MIGRAINE) 250-250-65 MG tablet Take 2 tablets by mouth every 6 (six) hours as needed for migraine.    Marland Kitchen buPROPion (WELLBUTRIN XL) 300 MG 24 hr tablet TAKE ONE TABLET BY MOUTH EVERY MORINING FOR DEPRESSION 30 tablet 2  . clonazePAM (KLONOPIN) 1 MG tablet TAKE ONE -TWO TABLETS AT BEDTIME 62 tablet 2  . hydrOXYzine (VISTARIL) 25 MG capsule Take 1-2 capsule at bed time 90 capsule 0  . ibuprofen (ADVIL,MOTRIN) 800 MG tablet Take 1 tablet (800 mg total) by mouth 3 (three) times daily. 21 tablet 0  . lithium 600  MG capsule Take 1 capsule (600 mg total) 3 (three) times daily with meals by mouth. 270 capsule 0  . metoprolol succinate (TOPROL-XL) 25 MG 24 hr tablet Take 12.5 mg by mouth daily.     . ondansetron (ZOFRAN ODT) 4 MG disintegrating tablet Take 1 tablet (4 mg total) by mouth every 8 (eight) hours as needed. 20 tablet 6  . risperiDONE (RISPERDAL) 2 MG tablet TAKE ONE TABLET BY MOUTH AT BEDTIME AS NEEDED FOR MOOD CONTROL 90 tablet 0  . topiramate (TOPAMAX) 50 MG tablet Take 1 tablet (50 mg total) daily by mouth. 90 tablet 0   No current facility-administered medications for this visit.      Musculoskeletal: Strength & Muscle Tone: within normal limits Gait & Station: normal Patient leans: N/A  Psychiatric Specialty Exam: Review of Systems  Constitutional: Negative.   Respiratory: Negative.   Skin: Negative.   Neurological: Positive for headaches.  Psychiatric/Behavioral: Positive for depression. The patient is nervous/anxious and has insomnia.     Blood pressure 128/74, pulse (!) 103, height _0  (1.6 m), weight 204 lb (92.5 kg).Body mass index is 36.14 kg/m.  General Appearance: Casual  Eye Contact:  Good  Speech:  Slow  Volume:  Normal  Mood:  Anxious and Dysphoric  Affect:  Appropriate  Thought Process:  Goal Directed  Orientation:  Full (Time, Place, and Person)  Thought Content: Logical and Rumination   Suicidal Thoughts:  No  Homicidal Thoughts:  No  Memory:  Immediate;   Fair Recent;   Fair Remote;   Fair  Judgement:  Good  Insight:  Good  Psychomotor Activity:  Decreased  Concentration:  Concentration: Fair and Attention Span: Fair  Recall:  Good  Fund of Knowledge: Good  Language: Good  Akathisia:  No  Handed:  Right  AIMS (if indicated): not done  Assets:  Communication Skills Desire for Improvement Housing Social Support  ADL's:  Intact  Cognition: WNL  Sleep:  Poor   Screenings: AUDIT     Admission (Discharged) from 09/08/2013 in [facility]  Alcohol Use Disorder Identification Test Final Score (AUDIT)  0       Assessment and Plan: Bipolar disorder with psychosis.  Anxiety disorder NOS.  I review collect information from emergency room, recent blood work results and psychosocial stressors.  I do believe patient is slowly decompensating because of poorly compliant with lithium.  Her last level was 0.35.  She is taking lithium 600 mg twice a day instead of 3 times a day.  Recommended to go back on lithium 600 mg 3 times a day as prescribed.  Continue Vistaril 25 mg twice a day to help with anxiety, Risperdal 2 mg at bedtime, Wellbutrin XL 300 mg daily, Topamax 50 mg at bedtime and Klonopin 1 mg twice a day.  I also discussed if she have again episodes of numbness on her face and headaches then she should see neurology.  I also discussed about her blood work results.  Her creatinine is 1.15.  Encourage to hydrate herself.  We will repeat lithium level in [redacted] week along with hemoglobin A1c and comprehensive metabolic panel.  Patient is not interested in counseling.  Follow-up in 4 weeks.  Time spent 25 minutes.  More than 50% of the time spent in psychoeducation, counseling, coronation of care and long-term prognosis of her illness.   [name], MD 01/29/2018, 2:27 PM

## 2018-02-10 LAB — COMPREHENSIVE METABOLIC PANEL
AG Ratio: 2 (calc) (ref 1.0–2.5)
ALT: 9 U/L (ref 6–29)
AST: 9 U/L — ABNORMAL LOW (ref 10–35)
Albumin: 4.2 g/dL (ref 3.6–5.1)
Alkaline phosphatase (APISO): 89 U/L (ref 33–130)
BUN/Creatinine Ratio: 14 (calc) (ref 6–22)
BUN: 15 mg/dL (ref 7–25)
CO2: 25 mmol/L (ref 20–32)
Calcium: 9.6 mg/dL (ref 8.6–10.4)
Chloride: 110 mmol/L (ref 98–110)
Creat: 1.06 mg/dL — ABNORMAL HIGH (ref 0.50–1.05)
Globulin: 2.1 g/dL (calc) (ref 1.9–3.7)
Glucose, Bld: 92 mg/dL (ref 65–139)
Potassium: 3.8 mmol/L (ref 3.5–5.3)
Sodium: 140 mmol/L (ref 135–146)
Total Bilirubin: 0.6 mg/dL (ref 0.2–1.2)
Total Protein: 6.3 g/dL (ref 6.1–8.1)

## 2018-02-10 LAB — LITHIUM LEVEL: Lithium Lvl: 1 mmol/L (ref 0.6–1.2)

## 2018-02-10 LAB — HEMOGLOBIN A1C
Hgb A1c MFr Bld: 5 % of total Hgb (ref <5.7)
Mean Plasma Glucose: 97 (calc)
eAG (mmol/L): 5.4 (calc)

## 2018-03-05 ENCOUNTER — Ambulatory Visit (HOSPITAL_COMMUNITY): Payer: Self-pay | Admitting: Psychiatry

## 2018-03-17 ENCOUNTER — Encounter (HOSPITAL_COMMUNITY): Payer: Self-pay | Admitting: Psychiatry

## 2018-03-17 ENCOUNTER — Ambulatory Visit (HOSPITAL_COMMUNITY): Payer: Medicare PPO | Admitting: Psychiatry

## 2018-03-17 DIAGNOSIS — Z736 Limitation of activities due to disability: Secondary | ICD-10-CM | POA: Diagnosis not present

## 2018-03-17 DIAGNOSIS — F419 Anxiety disorder, unspecified: Secondary | ICD-10-CM

## 2018-03-17 DIAGNOSIS — Z811 Family history of alcohol abuse and dependence: Secondary | ICD-10-CM | POA: Diagnosis not present

## 2018-03-17 DIAGNOSIS — Z818 Family history of other mental and behavioral disorders: Secondary | ICD-10-CM

## 2018-03-17 DIAGNOSIS — F319 Bipolar disorder, unspecified: Secondary | ICD-10-CM | POA: Diagnosis not present

## 2018-03-17 DIAGNOSIS — Z813 Family history of other psychoactive substance abuse and dependence: Secondary | ICD-10-CM

## 2018-03-17 MED ORDER — RISPERIDONE 2 MG PO TABS: ORAL_TABLET | ORAL | 0 refills | Status: DC

## 2018-03-17 MED ORDER — BUPROPION HCL ER (XL) 300 MG PO TB24: ORAL_TABLET | ORAL | 2 refills | Status: DC

## 2018-03-17 MED ORDER — TOPIRAMATE 50 MG PO TABS: 50.0000 mg | ORAL_TABLET | Freq: Every day | ORAL | 0 refills | Status: DC

## 2018-03-17 MED ORDER — LITHIUM CARBONATE 600 MG PO CAPS: 600.0000 mg | ORAL_CAPSULE | Freq: Three times a day (TID) | ORAL | 0 refills | Status: DC

## 2018-03-17 MED ORDER — HYDROXYZINE PAMOATE 25 MG PO CAPS: ORAL_CAPSULE | ORAL | 0 refills | Status: DC

## 2018-03-17 NOTE — Progress Notes (Signed)
BH MD/PA/NP OP Progress Note  03/17/2018 1:58 PM [name]  MRN:  [id number]  Chief Complaint: I am doing better.  I am taking lithium 3 times a day.  HPI: Patient came for her follow-up appointment.  She is doing much better since taking lithium 3 times a day.  She denies any paranoia, hallucination, suicidal thoughts or homicidal thought.  She is sleeping better.  She denies any depressive thoughts.  She is still upset because her son getting married in [place] and patient does not want to fly.  However she is happy because son is happy.  She denies any recent arguments or irritability.  She is getting along with her husband and son.  Her lithium level is 1.0.  Her hemoglobin A1c is normal.  Patient has no tremors, shakes or any EPS.  She realized that she need to take the medication on time to avoid relapse.  Her racing thoughts are also improved.  Patient is not interested in counseling.  Patient has no more episodes of numbness in her face.  Patient denies drinking alcohol or using any illegal substances.  Visit Diagnosis:    ICD-10-CM   1. Bipolar I disorder (HCC) F31.9 hydrOXYzine (VISTARIL) 25 MG capsule    topiramate (TOPAMAX) 50 MG tablet    risperiDONE (RISPERDAL) 2 MG tablet    buPROPion (WELLBUTRIN XL) 300 MG 24 hr tablet    lithium 600 MG capsule    Past Psychiatric History: Reviewed Patient has history of bipolar disorder with multiple psychiatric inpatient treatment. Her last admission was September 2014 for suicidal thinking and hallucination. She was playing with the guns and superficially cut her wrist with a needle. She has history of mania and psychosis. In the past she had tried Zyprexa Seroquel however she is very resistant and reluctant to take any medication that can cause weight gain. She was tried Tegretol but caused increased paranoia confusion and she required hospitalization at behavioral [facility].  Past Medical History:  Past Medical History:   Diagnosis Date  . Anxiety   . Bipolar disorder ([place])   . Brain tumor (benign) ([place])   . Depression   . HTN (hypertension)   . Mania ([place])   . Meningioma ([place])   . Migraine     Past Surgical History:  Procedure Laterality Date  . ABDOMINAL HYSTERECTOMY    . BRAIN SURGERY    . breast tumor removal    . INCONTINENCE SURGERY    . INTERSTIM IMPLANT PLACEMENT    . KNEE SURGERY     right x 2  . PLANTAR FASCIECTOMY Left 03/05/16  . TUBAL LIGATION      Family Psychiatric History: Reviewed.  Family History:  Family History  Problem Relation Age of Onset  . Alcohol abuse Father   . Diabetes Father   . Drug abuse Brother   . Mental illness Mother   . Breast cancer Mother   . Dementia Neg Hx   . ADD / ADHD Neg Hx   . Anxiety disorder Neg Hx   . Bipolar disorder Neg Hx   . Depression Neg Hx   . OCD Neg Hx   . Paranoid behavior Neg Hx   . Schizophrenia Neg Hx   . Seizures Neg Hx   . Sexual abuse Neg Hx   . Physical abuse Neg Hx   . Suicidality Neg Hx     Social History:  Social History   Socioeconomic History  . Marital status: Married  Spouse name: Not on file  . Number of children: 2  . Years of education: HS  . Highest education level: Not on file  Occupational History  . Occupation: Disabled  Social Needs  . Financial resource strain: Not very hard  . Food insecurity:    Worry: Never true    Inability: Never true  . Transportation needs:    Medical: No    Non-medical: No  Tobacco Use  . Smoking status: Never Smoker  . Smokeless tobacco: Never Used  Substance and Sexual Activity  . Alcohol use: No    Alcohol/week: 0.0 oz  . Drug use: No  . Sexual activity: Never    Birth control/protection: None  Lifestyle  . Physical activity:    Days per week: 0 days    Minutes per session: 0 min  . Stress: Rather much  Relationships  . Social connections:    Talks on phone: More than three times a week    Gets together: Twice a week    Attends religious  service: Never    Active member of club or organization: No    Attends meetings of clubs or organizations: Never    Relationship status: Married  Other Topics Concern  . Not on file  Social History Narrative   Lives at home with husband.   Right-handed.   2 cups caffeine per day.    Allergies:  Allergies  Allergen Reactions  . Gabapentin Anaphylaxis  . Oxcarbazepine Anaphylaxis  . Tegretol [Carbamazepine] Nausea And Vomiting  . Codeine Nausea Only  . Divalproex Sodium Swelling  . Erythromycin Nausea And Vomiting  . Phenytoin Sodium Extended Swelling  . Phenytoin Sodium Extended Rash    Metabolic Disorder Labs: Lab Results  Component Value Date   HGBA1C 5.0 02/09/2018   MPG 97 02/09/2018   MPG 97 05/24/2016   No results found for: PROLACTIN Lab Results  Component Value Date   CHOL 174 05/13/2013   TRIG 48 05/13/2013   HDL 69 05/13/2013   CHOLHDL 2.5 05/13/2013   VLDL 10 05/13/2013   LDLCALC 95 05/13/2013   Lab Results  Component Value Date   TSH 1.79 05/24/2016   TSH 1.268 11/17/2012    Therapeutic Level Labs: Lab Results  Component Value Date   LITHIUM 1.0 02/09/2018   LITHIUM 0.35 (L) 12/16/2017   Lab Results  Component Value Date   VALPROATE <10.0 (L) 09/09/2013   No components found for:  CBMZ  Current Medications: Current Outpatient Medications  Medication Sig Dispense Refill  . aspirin-acetaminophen-caffeine (EXCEDRIN MIGRAINE) 250-250-65 MG tablet Take 2 tablets by mouth every 6 (six) hours as needed for migraine.    Marland Kitchen buPROPion (WELLBUTRIN XL) 300 MG 24 hr tablet TAKE ONE TABLET BY MOUTH EVERY MORINING FOR DEPRESSION 30 tablet 2  . clonazePAM (KLONOPIN) 1 MG tablet TAKE ONE -TWO TABLETS AT BEDTIME 62 tablet 2  . hydrOXYzine (VISTARIL) 25 MG capsule Take 1-2 capsule at bed time 90 capsule 0  . ibuprofen (ADVIL,MOTRIN) 800 MG tablet Take 1 tablet (800 mg total) by mouth 3 (three) times daily. 21 tablet 0  . lithium 600 MG capsule Take 1 capsule  (600 mg total) by mouth 3 (three) times daily with meals. 270 capsule 0  . metoprolol succinate (TOPROL-XL) 25 MG 24 hr tablet Take 12.5 mg by mouth daily.     . ondansetron (ZOFRAN ODT) 4 MG disintegrating tablet Take 1 tablet (4 mg total) by mouth every 8 (eight) hours as needed. 20 tablet  6  . risperiDONE (RISPERDAL) 2 MG tablet TAKE ONE TABLET BY MOUTH AT BEDTIME AS NEEDED FOR MOOD CONTROL 90 tablet 0  . topiramate (TOPAMAX) 50 MG tablet Take 1 tablet (50 mg total) by mouth daily. 90 tablet 0   No current facility-administered medications for this visit.      Musculoskeletal: Strength & Muscle Tone: within normal limits Gait & Station: normal Patient leans: N/A  Psychiatric Specialty Exam: ROS  Blood pressure 119/86, pulse 86, height 5\' 3"  (1.6 m), weight 205 lb (93 kg), SpO2 97 %.Body mass index is 36.31 kg/m.  General Appearance: Casual  Eye Contact:  Good  Speech:  Clear and Coherent  Volume:  Normal  Mood:  Euthymic  Affect:  Congruent  Thought Process:  Goal Directed  Orientation:  Full (Time, Place, and Person)  Thought Content: Logical   Suicidal Thoughts:  No  Homicidal Thoughts:  No  Memory:  Immediate;   Good Recent;   Good Remote;   Good  Judgement:  Good  Insight:  Good  Psychomotor Activity:  Normal  Concentration:  Concentration: Good and Attention Span: Good  Recall:  Good  Fund of Knowledge: Good  Language: Good  Akathisia:  No  Handed:  Right  AIMS (if indicated): not done  Assets:  Communication Skills  ADL's:  Intact  Cognition: WNL  Sleep:  Good   Screenings: AUDIT     Admission (Discharged) from 09/08/2013 in [facility]  Alcohol Use Disorder Identification Test Final Score (AUDIT)  0       Assessment and Plan: Bipolar disorder with psychosis.  Anxiety disorder NOS.  Patient doing better since she is taking lithium as prescribed.  She has no agitation or anger.  I reviewed blood work results.  Her  lithium level is 1.0 and her hemoglobin A1c is normal.  Continue lithium 600 mg 3 times a day, Vistaril 25 mg twice a day, Risperdal 2 mg at bedtime, Wellbutrin XL 300 mg daily, Topamax 50 mg at bedtime and Klonopin 1 mg twice a day.  Patient is not interested in counseling.  Discussed polypharmacy but patient reluctant to cut down the medication due to fear of relapse.  Encouraged healthy lifestyle.  Recommended to call us back if she has any question, concern if she feels worsening of the symptoms.  Follow-up in 3 months.   [name], MD 03/17/2018, 1:58 PM

## 2018-05-10 ENCOUNTER — Other Ambulatory Visit (HOSPITAL_COMMUNITY): Payer: Self-pay | Admitting: Psychiatry

## 2018-05-10 DIAGNOSIS — F319 Bipolar disorder, unspecified: Secondary | ICD-10-CM

## 2018-05-13 ENCOUNTER — Other Ambulatory Visit (HOSPITAL_COMMUNITY): Payer: Self-pay | Admitting: Psychiatry

## 2018-05-13 DIAGNOSIS — F319 Bipolar disorder, unspecified: Secondary | ICD-10-CM

## 2018-05-13 MED ORDER — HYDROXYZINE PAMOATE 25 MG PO CAPS: ORAL_CAPSULE | ORAL | 0 refills | Status: DC

## 2018-06-17 ENCOUNTER — Ambulatory Visit (INDEPENDENT_AMBULATORY_CARE_PROVIDER_SITE_OTHER): Payer: Medicare PPO | Admitting: Psychiatry

## 2018-06-17 ENCOUNTER — Ambulatory Visit (HOSPITAL_COMMUNITY): Payer: Medicare PPO | Admitting: Psychiatry

## 2018-06-17 ENCOUNTER — Encounter (HOSPITAL_COMMUNITY): Payer: Self-pay | Admitting: Psychiatry

## 2018-06-17 DIAGNOSIS — Z79899 Other long term (current) drug therapy: Secondary | ICD-10-CM

## 2018-06-17 DIAGNOSIS — Z91410 Personal history of adult physical and sexual abuse: Secondary | ICD-10-CM | POA: Diagnosis not present

## 2018-06-17 DIAGNOSIS — F319 Bipolar disorder, unspecified: Secondary | ICD-10-CM

## 2018-06-17 DIAGNOSIS — Z736 Limitation of activities due to disability: Secondary | ICD-10-CM

## 2018-06-17 MED ORDER — CLONAZEPAM 1 MG PO TABS: ORAL_TABLET | ORAL | 2 refills | Status: DC

## 2018-06-17 MED ORDER — TOPIRAMATE 50 MG PO TABS: 50.0000 mg | ORAL_TABLET | Freq: Every day | ORAL | 0 refills | Status: DC

## 2018-06-17 MED ORDER — LITHIUM CARBONATE 600 MG PO CAPS: 600.0000 mg | ORAL_CAPSULE | Freq: Three times a day (TID) | ORAL | 0 refills | Status: DC

## 2018-06-17 MED ORDER — BUPROPION HCL ER (XL) 300 MG PO TB24: ORAL_TABLET | ORAL | 2 refills | Status: DC

## 2018-06-17 MED ORDER — RISPERIDONE 2 MG PO TABS: ORAL_TABLET | ORAL | 0 refills | Status: DC

## 2018-06-17 MED ORDER — HYDROXYZINE PAMOATE 25 MG PO CAPS: ORAL_CAPSULE | ORAL | 0 refills | Status: DC
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
## 2018-06-22 ENCOUNTER — Other Ambulatory Visit (HOSPITAL_COMMUNITY): Payer: Self-pay | Admitting: Psychiatry

## 2018-06-22 DIAGNOSIS — F319 Bipolar disorder, unspecified: Secondary | ICD-10-CM

## 2018-06-23 ENCOUNTER — Other Ambulatory Visit (HOSPITAL_COMMUNITY): Payer: Self-pay | Admitting: Psychiatry

## 2018-09-17 ENCOUNTER — Ambulatory Visit (HOSPITAL_COMMUNITY): Payer: Self-pay | Admitting: Psychiatry

## 2018-09-29 ENCOUNTER — Encounter (HOSPITAL_COMMUNITY): Payer: Self-pay | Admitting: Emergency Medicine

## 2018-09-29 ENCOUNTER — Other Ambulatory Visit: Payer: Self-pay

## 2018-09-29 ENCOUNTER — Emergency Department (HOSPITAL_COMMUNITY)
Admission: EM | Admit: 2018-09-29 | Discharge: 2018-09-29 | Disposition: A | Discharge status: Home / Self Care | Payer: Medicare PPO | Attending: Emergency Medicine | Admitting: Emergency Medicine

## 2018-09-29 ENCOUNTER — Emergency Department (HOSPITAL_COMMUNITY): Payer: Medicare PPO

## 2018-09-29 DIAGNOSIS — X500XXA Overexertion from strenuous movement or load, initial encounter: Secondary | ICD-10-CM | POA: Insufficient documentation

## 2018-09-29 DIAGNOSIS — S93602A Unspecified sprain of left foot, initial encounter: Secondary | ICD-10-CM | POA: Insufficient documentation

## 2018-09-29 DIAGNOSIS — Z79899 Other long term (current) drug therapy: Secondary | ICD-10-CM | POA: Insufficient documentation

## 2018-09-29 DIAGNOSIS — Y929 Unspecified place or not applicable: Secondary | ICD-10-CM | POA: Insufficient documentation

## 2018-09-29 DIAGNOSIS — Y93K3 Activity, grooming and shearing an animal: Secondary | ICD-10-CM | POA: Insufficient documentation

## 2018-09-29 DIAGNOSIS — Y999 Unspecified external cause status: Secondary | ICD-10-CM | POA: Diagnosis not present

## 2018-09-29 DIAGNOSIS — I10 Essential (primary) hypertension: Secondary | ICD-10-CM | POA: Insufficient documentation

## 2018-09-29 DIAGNOSIS — S99922A Unspecified injury of left foot, initial encounter: Secondary | ICD-10-CM | POA: Diagnosis present

## 2018-09-29 MED ORDER — NAPROXEN 500 MG PO TABS: 500.0000 mg | ORAL_TABLET | Freq: Two times a day (BID) | ORAL | 0 refills | Status: DC

## 2018-09-29 NOTE — ED Triage Notes (Signed)
Pt c/o of left foot pain from falling down steps last Wednesday.  Pt states the pain worsened today

## 2018-09-29 NOTE — Discharge Instructions (Addendum)
Minimize any activity that worsens your pain.  I recommend using the postop shoe to help minimizing flexion of your foot while walking.  You may alternate ice and heat therapy and elevate your foot is much as possible.  Follow-up with your orthopedist in [place] for further evaluation if your symptoms persist.  Your x-rays today are negative as discussed but she may need further testing to rule out a ligament injury if your symptoms do not improve with today's treatment.
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
## 2018-10-09 ENCOUNTER — Encounter (HOSPITAL_COMMUNITY): Payer: Self-pay | Admitting: Emergency Medicine

## 2018-10-09 ENCOUNTER — Emergency Department (HOSPITAL_COMMUNITY)
Admission: EM | Admit: 2018-10-09 | Discharge: 2018-10-09 | Disposition: A | Discharge status: Home / Self Care | Payer: Medicare PPO | Attending: Emergency Medicine | Admitting: Emergency Medicine

## 2018-10-09 ENCOUNTER — Other Ambulatory Visit: Payer: Self-pay

## 2018-10-09 ENCOUNTER — Emergency Department (HOSPITAL_COMMUNITY): Payer: Medicare PPO

## 2018-10-09 DIAGNOSIS — S99922D Unspecified injury of left foot, subsequent encounter: Secondary | ICD-10-CM

## 2018-10-09 DIAGNOSIS — W109XXD Fall (on) (from) unspecified stairs and steps, subsequent encounter: Secondary | ICD-10-CM | POA: Insufficient documentation

## 2018-10-09 DIAGNOSIS — I10 Essential (primary) hypertension: Secondary | ICD-10-CM | POA: Insufficient documentation

## 2018-10-09 DIAGNOSIS — Z79899 Other long term (current) drug therapy: Secondary | ICD-10-CM | POA: Insufficient documentation

## 2018-10-09 MED ORDER — HYDROCODONE-ACETAMINOPHEN 5-325 MG PO TABS: 1.0000 | ORAL_TABLET | ORAL | 0 refills | Status: DC | PRN

## 2018-10-09 MED ORDER — IBUPROFEN 800 MG PO TABS
800.0000 mg | ORAL_TABLET | Freq: Once | ORAL | Status: AC
  Administered 2018-10-09: 800 mg via ORAL
  Filled 2018-10-09: qty 1

## 2018-10-09 NOTE — Discharge Instructions (Addendum)
Your CT scan is negative for any occult fractures or other obvious source of your pain.  Use the medicine prescribed for increased pain relief, use the crutches to avoid weight bearing.  Call your orthopedist as planned for further management of your pain.

## 2018-10-09 NOTE — ED Provider Notes (Signed)
[facility] EMERGENCY DEPARTMENT Provider Note   CSN: [id number] Arrival date & time: 10/09/18  1042     History   Chief Complaint Chief Complaint  Patient presents with  . Foot Injury    HPI PRESLEE REGAS is a 57 y.o. female  presenting for reevaluation of her left foot pain which has not improved since she was seen here 10 days ago for evaluation of injury of the extremity when she fell down her steps.  Her xrays were negative for acute injury that day and she was placed in a post op shoe and has used ice, elevation and nsaids without relief of pain. She has pain that radiates to her lower ankle area with walking (flexing the foot worsens the pain), better at rest and ok with weight bearing.  She does have an orthopedist in [place], but has yet to contact him for followup care.     The history is provided by the patient.    Past Medical History:  Diagnosis Date  . Anxiety   . Bipolar disorder ([place])   . Brain tumor (benign) ([place])   . Depression   . HTN (hypertension)   . Mania ([place])   . Meningioma ([place])   . Migraine     Patient Active Problem List   Diagnosis Date Noted  . Chronic migraine 11/06/2016  . Hx of resection of meningioma 10/07/2016  . Bipolar 1 disorder, depressed, moderate ([place]) 11/14/2012    Class: Chronic  . 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 ABDOMINAL HYSTERECTOMY    . BRAIN SURGERY    . breast tumor removal    . INCONTINENCE SURGERY    . INTERSTIM IMPLANT PLACEMENT    . KNEE SURGERY     right x 2  . PLANTAR FASCIECTOMY Left 03/05/16  . TUBAL LIGATION       OB History    Gravida  2   Para  2   Term  2   Preterm      AB      Living  2     SAB      TAB      Ectopic      Multiple      Live Births               Home Medications    Prior to Admission medications   Medication Sig  Start Date End Date Taking? Authorizing Provider  aspirin-acetaminophen-caffeine (EXCEDRIN MIGRAINE) [phone] MG tablet Take 2 tablets by mouth every 6 (six) hours as needed for migraine.    [provider]  buPROPion (WELLBUTRIN XL) 300 MG 24 hr tablet TAKE ONE TABLET BY MOUTH EVERY MORINING FOR DEPRESSION 06/17/18   Arfeen, Arlyce Harman, MD  clonazePAM (KLONOPIN) 1 MG tablet TAKE ONE -TWO TABLETS AT BEDTIME 06/17/18   Arfeen, Arlyce Harman, MD  Fluticasone Furoate-Vilanterol (BREO ELLIPTA IN) Inhale into the lungs daily.    [provider]  HYDROcodone-acetaminophen (NORCO/VICODIN) 5-325 MG tablet Take 1 tablet by mouth every 4 (four) hours as needed. 10/09/18   Evalee Jefferson, PA-C  hydrOXYzine (VISTARIL) 25 MG capsule Take 1-2 capsule at bed time 06/17/18   Arfeen, Arlyce Harman, MD  ibuprofen (ADVIL,MOTRIN) 800 MG tablet Take 1 tablet (800 mg total) by mouth 3 (three) times daily. 12/16/17   Noemi Chapel, MD  lithium 600 MG capsule  Take 1 capsule (600 mg total) by mouth 3 (three) times daily with meals. 06/17/18   Arfeen, Arlyce Harman, MD  metoprolol succinate (TOPROL-XL) 25 MG 24 hr tablet Take 12.5 mg by mouth daily.  07/22/16   [provider]  naproxen (NAPROSYN) 500 MG tablet Take 1 tablet (500 mg total) by mouth 2 (two) times daily. 09/29/18   Evalee Jefferson, PA-C  ondansetron (ZOFRAN ODT) 4 MG disintegrating tablet Take 1 tablet (4 mg total) by mouth every 8 (eight) hours as needed. 10/07/16   Marcial Pacas, MD  risperiDONE (RISPERDAL) 2 MG tablet TAKE ONE TABLET BY MOUTH AT BEDTIME AS NEEDED FOR MOOD CONTROL 06/17/18   Arfeen, Arlyce Harman, MD  topiramate (TOPAMAX) 50 MG tablet Take 1 tablet (50 mg total) by mouth daily. 06/17/18   Arfeen, Arlyce Harman, MD    Family History Family History  Problem Relation Age of Onset  . Alcohol abuse Father   . Diabetes Father   . Drug abuse Brother   . Mental illness Mother   . Breast cancer Mother   . Dementia Neg Hx   . ADD / ADHD Neg Hx   . Anxiety disorder Neg  Hx   . Bipolar disorder Neg Hx   . Depression Neg Hx   . OCD Neg Hx   . Paranoid behavior Neg Hx   . Schizophrenia Neg Hx   . Seizures Neg Hx   . Sexual abuse Neg Hx   . Physical abuse Neg Hx   . Suicidality Neg Hx     Social History Social History   Tobacco Use  . Smoking status: Never Smoker  . Smokeless tobacco: Never Used  Substance Use Topics  . Alcohol use: No    Alcohol/week: 0.0 standard drinks  . Drug use: No     Allergies   Gabapentin; Oxcarbazepine; Tegretol [carbamazepine]; Codeine; Divalproex sodium; Erythromycin; Phenytoin sodium extended; and Phenytoin sodium extended   Review of Systems Review of Systems  Constitutional: Negative for fever.  Musculoskeletal: Positive for arthralgias. Negative for joint swelling and myalgias.  Neurological: Negative for weakness and numbness.     Physical Exam Updated Vital Signs BP (!) 119/96 (BP Location: Right Arm)   Pulse 75   Temp 97.9 F (36.6 C) (Oral)   Resp 16   Ht 5\' 3"  (1.6 m)   Wt 91.2 kg   SpO2 97%   BMI 35.61 kg/m   Physical Exam  Constitutional: She appears well-developed and well-nourished.  HENT:  Head: Atraumatic.  Neck: Normal range of motion.  Cardiovascular:  Pulses equal bilaterally  Musculoskeletal: She exhibits tenderness.       Feet:  ttp across the left dorsal midfoot. There is no palpable deformity, no bruising, no edema.  Ankle intact, nontender.  Distal sensation intact. Less than 2 sec cap refill in toes.  Neurological: She is alert. She has normal strength. She displays normal reflexes. No sensory deficit.  Skin: Skin is warm and dry.  Psychiatric: She has a normal mood and affect.     ED Treatments / Results  Labs (all labs ordered are listed, but only abnormal results are displayed) Labs Reviewed - No data to display  EKG None  Radiology Ct Foot Left Wo Contrast  Result Date: 10/09/2018 CLINICAL DATA:  Left foot pain status post fall this past Tuesday. EXAM:  CT OF THE LEFT FOOT WITHOUT CONTRAST TECHNIQUE: Multidetector CT imaging of the left foot was performed according to the standard protocol. Multiplanar CT image reconstructions  were also generated. COMPARISON:  None. FINDINGS: Bones/Joint/Cartilage No fracture or dislocation. Normal alignment. No joint effusion. No aggressive osseous lesion. No periosteal reaction or bone destruction. Bipartite medial hallux sesamoid. Joint spaces are maintained. No erosive changes. Lisfranc joint is congruent on this nonweightbearing exam. Small plantar calcaneal spur. Normal ankle mortise.  Normal subtalar joints. Ligaments Ligaments are suboptimally evaluated by CT. Muscles and Tendons Muscles are normal. Flexor, extensor, peroneal and Achilles tendons are intact. Soft tissue No fluid collection or hematoma.  No soft tissue mass. IMPRESSION: 1.  No acute osseous injury of the left foot. Electronically Signed   By: Kathreen Devoid   On: 10/09/2018 12:28    Procedures Procedures (including critical care time)  Medications Ordered in ED Medications  ibuprofen (ADVIL,MOTRIN) tablet 800 mg (800 mg Oral Given 10/09/18 1212)     Initial Impression / Assessment and Plan / ED Course  I have reviewed the triage vital signs and the nursing notes.  Pertinent labs & imaging results that were available during my care of the patient were reviewed by me and considered in my medical decision making (see chart for details).     CT imaging performed given persistent pain and negative plain films at first visit. Negative for acute fracture or other injury.  Again, advised pt that she will need ortho f/u for further evaluation, may need to to have MRI imaging to better evaluate for ligament/soft tissue injury, but no obvious injury based on todays study.  RICE,  Provided crutches.  Hydrocodone.  [place] controlled substance database reviewed.   Final Clinical Impressions(s) / ED Diagnoses   Final diagnoses:  Injury of left foot,  subsequent encounter    ED Discharge Orders         Ordered    HYDROcodone-acetaminophen (NORCO/VICODIN) 5-325 MG tablet  Every 4 hours PRN     10/09/18 1300           Evalee Jefferson, PA-C 10/09/18 1701    Orlie Dakin, MD 10/13/18 1108

## 2018-10-09 NOTE — ED Triage Notes (Signed)
Lt foot pain since fall this past Tuesday.  Pt was seen here with neg xrays.  Pt reports foot is not feeling any better

## 2018-10-19 ENCOUNTER — Ambulatory Visit (HOSPITAL_COMMUNITY): Payer: Medicare PPO | Admitting: Psychiatry

## 2018-10-19 ENCOUNTER — Other Ambulatory Visit (HOSPITAL_COMMUNITY): Payer: Self-pay | Admitting: Psychiatry

## 2018-10-19 DIAGNOSIS — F319 Bipolar disorder, unspecified: Secondary | ICD-10-CM

## 2018-10-20 ENCOUNTER — Other Ambulatory Visit (HOSPITAL_COMMUNITY): Payer: Self-pay | Admitting: Psychiatry

## 2018-10-20 ENCOUNTER — Telehealth (HOSPITAL_COMMUNITY): Payer: Self-pay

## 2018-10-20 DIAGNOSIS — F319 Bipolar disorder, unspecified: Secondary | ICD-10-CM

## 2018-10-20 MED ORDER — CLONAZEPAM 1 MG PO TABS: ORAL_TABLET | ORAL | 0 refills | Status: DC

## 2018-10-20 NOTE — Telephone Encounter (Signed)
Med[facility].  Please asked patient to pick up the prescription.

## 2018-10-20 NOTE — Telephone Encounter (Signed)
Patient called requesting a refill on Klonopin 1mg  tab. Next appointment is scheduled for 11-11-18. [facility] [name].

## 2018-10-20 NOTE — Telephone Encounter (Signed)
Called patient to inform her that prescription had been sent to pharmacy

## 2018-10-29 ENCOUNTER — Other Ambulatory Visit (HOSPITAL_COMMUNITY): Payer: Self-pay

## 2018-10-29 DIAGNOSIS — F319 Bipolar disorder, unspecified: Secondary | ICD-10-CM

## 2018-10-29 MED ORDER — TOPIRAMATE 50 MG PO TABS: 50.0000 mg | ORAL_TABLET | Freq: Every day | ORAL | 0 refills | Status: DC

## 2018-10-29 MED ORDER — BUPROPION HCL ER (XL) 300 MG PO TB24: ORAL_TABLET | ORAL | 0 refills | Status: DC

## 2018-10-29 MED ORDER — RISPERIDONE 2 MG PO TABS: ORAL_TABLET | ORAL | 0 refills | Status: DC

## 2018-10-29 MED ORDER — LITHIUM CARBONATE 600 MG PO CAPS: 600.0000 mg | ORAL_CAPSULE | Freq: Three times a day (TID) | ORAL | 0 refills | Status: DC

## 2018-10-30 ENCOUNTER — Other Ambulatory Visit (HOSPITAL_COMMUNITY): Payer: Self-pay

## 2018-10-30 DIAGNOSIS — F319 Bipolar disorder, unspecified: Secondary | ICD-10-CM

## 2018-10-30 MED ORDER — RISPERIDONE 2 MG PO TABS: ORAL_TABLET | ORAL | 0 refills | Status: DC

## 2018-10-30 MED ORDER — TOPIRAMATE 50 MG PO TABS: 50.0000 mg | ORAL_TABLET | Freq: Every day | ORAL | 0 refills | Status: DC

## 2018-10-30 MED ORDER — LITHIUM CARBONATE 600 MG PO CAPS: 600.0000 mg | ORAL_CAPSULE | Freq: Three times a day (TID) | ORAL | 0 refills | Status: DC

## 2018-10-30 MED ORDER — HYDROXYZINE PAMOATE 25 MG PO CAPS: ORAL_CAPSULE | ORAL | 0 refills | Status: DC

## 2018-11-11 ENCOUNTER — Ambulatory Visit (HOSPITAL_COMMUNITY): Payer: Medicare PPO | Admitting: Psychiatry

## 2018-12-09 ENCOUNTER — Ambulatory Visit (HOSPITAL_COMMUNITY): Payer: Medicare PPO | Admitting: Psychiatry

## 2018-12-21 ENCOUNTER — Other Ambulatory Visit (HOSPITAL_COMMUNITY): Payer: Self-pay

## 2018-12-21 DIAGNOSIS — F319 Bipolar disorder, unspecified: Secondary | ICD-10-CM

## 2018-12-21 MED ORDER — CLONAZEPAM 1 MG PO TABS: ORAL_TABLET | ORAL | 0 refills | Status: DC

## 2019-01-04 ENCOUNTER — Ambulatory Visit (INDEPENDENT_AMBULATORY_CARE_PROVIDER_SITE_OTHER): Payer: Medicare PPO | Admitting: Psychiatry

## 2019-01-04 ENCOUNTER — Encounter (HOSPITAL_COMMUNITY): Payer: Self-pay | Admitting: Psychiatry

## 2019-01-04 VITALS — BP 123/87 | HR 82 | Ht 63.0 in | Wt 204.0 lb

## 2019-01-04 DIAGNOSIS — F319 Bipolar disorder, unspecified: Secondary | ICD-10-CM | POA: Diagnosis not present

## 2019-01-04 DIAGNOSIS — F411 Generalized anxiety disorder: Secondary | ICD-10-CM | POA: Diagnosis not present

## 2019-01-04 MED ORDER — RISPERIDONE 2 MG PO TABS: ORAL_TABLET | ORAL | 0 refills | Status: DC

## 2019-01-04 MED ORDER — BUPROPION HCL ER (XL) 300 MG PO TB24: ORAL_TABLET | ORAL | 0 refills | Status: DC

## 2019-01-04 MED ORDER — TOPIRAMATE 25 MG PO TABS: 25.0000 mg | ORAL_TABLET | Freq: Every day | ORAL | 0 refills | Status: DC

## 2019-01-04 MED ORDER — CLONAZEPAM 1 MG PO TABS: 1.0000 mg | ORAL_TABLET | Freq: Every day | ORAL | 1 refills | Status: DC

## 2019-01-04 MED ORDER — HYDROXYZINE HCL 10 MG PO TABS: 10.0000 mg | ORAL_TABLET | Freq: Every evening | ORAL | 0 refills | Status: DC | PRN

## 2019-01-04 MED ORDER — LITHIUM CARBONATE 600 MG PO CAPS: 600.0000 mg | ORAL_CAPSULE | Freq: Three times a day (TID) | ORAL | 0 refills | Status: DC

## 2019-01-04 NOTE — Progress Notes (Signed)
BH MD/PA/NP OP Progress Note  01/04/2019 2:23 PM [name]  MRN:  [id number]  Chief Complaint: Sometimes I feel very tired and sedated.    HPI: [name] came for her follow-up appointment.  She was last seen in June.  She had a good Christmas.  Her son got married in [place].  She did not fly because of anxiety but able to do face time with her son.  She is taking her medication but sometimes she feels very tired and sleepy during the day.  Recently she fell and hurt her knee joint.  She was taking pain medication but she has cut down a lot her medication as pain is getting better.  She is not seeing [name] because she noticed talking about her past make her more upset and having nightmares.  Her headaches are better since taking the Topamax.  She is taking Risperdal, hydroxyzine, Wellbutrin, Klonopin and lithium.  She is tolerating her medication other than she has been sleepy during the day.  Her energy level is okay.  Patient denies drinking or using any illegal substances.  She lives with her husband who is very supportive.  Patient denies any paranoia, hallucination or any suicidal thoughts.  Visit Diagnosis:    ICD-10-CM   1. Bipolar I disorder (HCC) F31.9 clonazePAM (KLONOPIN) 1 MG tablet    buPROPion (WELLBUTRIN XL) 300 MG 24 hr tablet    risperiDONE (RISPERDAL) 2 MG tablet    topiramate (TOPAMAX) 25 MG tablet    lithium 600 MG capsule  2. GAD (generalized anxiety disorder) F41.1 buPROPion (WELLBUTRIN XL) 300 MG 24 hr tablet    hydrOXYzine (ATARAX/VISTARIL) 10 MG tablet    Past Psychiatric History: Reviewed. History of bipolar disorder with multiple psychiatric inpatient treatment. Last admission in September 2014 for suicidal thinking, hallucination as playing with guns and superficially cut her wrist with a needle. History of mania and psychosis.Tried Zyprexa Seroquel but stopped due to weight gain. Tegretol causedincreased paranoia confusion and required inpatient.   Past  Medical History:  Past Medical History:  Diagnosis Date  . Anxiety   . Bipolar disorder ([place])   . Brain tumor (benign) ([place])   . Depression   . HTN (hypertension)   . Mania ([place])   . Meningioma ([place])   . Migraine     Past Surgical History:  Procedure Laterality Date  . ABDOMINAL HYSTERECTOMY    . BRAIN SURGERY    . breast tumor removal    . INCONTINENCE SURGERY    . INTERSTIM IMPLANT PLACEMENT    . KNEE SURGERY     right x 2  . PLANTAR FASCIECTOMY Left 03/05/16  . TUBAL LIGATION      Family Psychiatric History: Viewed.  Family History:  Family History  Problem Relation Age of Onset  . Alcohol abuse Father   . Diabetes Father   . Drug abuse Brother   . Mental illness Mother   . Breast cancer Mother   . Dementia Neg Hx   . ADD / ADHD Neg Hx   . Anxiety disorder Neg Hx   . Bipolar disorder Neg Hx   . Depression Neg Hx   . OCD Neg Hx   . Paranoid behavior Neg Hx   . Schizophrenia Neg Hx   . Seizures Neg Hx   . Sexual abuse Neg Hx   . Physical abuse Neg Hx   . Suicidality Neg Hx     Social History:  Social History   Socioeconomic History  .  Marital status: Married    Spouse name: Not on file  . Number of children: 2  . Years of education: HS  . Highest education level: Not on file  Occupational History  . Occupation: Disabled  Social Needs  . Financial resource strain: Not very hard  . Food insecurity:    Worry: Never true    Inability: Never true  . Transportation needs:    Medical: No    Non-medical: No  Tobacco Use  . Smoking status: Never Smoker  . Smokeless tobacco: Never Used  Substance and Sexual Activity  . Alcohol use: No    Alcohol/week: 0.0 standard drinks  . Drug use: No  . Sexual activity: Never    Birth control/protection: None  Lifestyle  . Physical activity:    Days per week: 0 days    Minutes per session: 0 min  . Stress: Rather much  Relationships  . Social connections:    Talks on phone: More than three times a week     Gets together: Twice a week    Attends religious service: Never    Active member of club or organization: No    Attends meetings of clubs or organizations: Never    Relationship status: Married  Other Topics Concern  . Not on file  Social History Narrative   Lives at home with husband.   Right-handed.   2 cups caffeine per day.    Allergies:  Allergies  Allergen Reactions  . Gabapentin Anaphylaxis  . Oxcarbazepine Anaphylaxis  . Tegretol [Carbamazepine] Nausea And Vomiting  . Codeine Nausea Only  . Divalproex Sodium Swelling  . Erythromycin Nausea And Vomiting  . Phenytoin Sodium Extended Swelling  . Phenytoin Sodium Extended Rash    Metabolic Disorder Labs: Lab Results  Component Value Date   HGBA1C 5.0 02/09/2018   MPG 97 02/09/2018   MPG 97 05/24/2016   No results found for: PROLACTIN Lab Results  Component Value Date   CHOL 174 05/13/2013   TRIG 48 05/13/2013   HDL 69 05/13/2013   CHOLHDL 2.5 05/13/2013   VLDL 10 05/13/2013   LDLCALC 95 05/13/2013   Lab Results  Component Value Date   TSH 1.79 05/24/2016   TSH 1.268 11/17/2012    Therapeutic Level Labs: Lab Results  Component Value Date   LITHIUM 1.0 02/09/2018   LITHIUM 0.35 (L) 12/16/2017   Lab Results  Component Value Date   VALPROATE <10.0 (L) 09/09/2013   No components found for:  CBMZ  Current Medications: Current Outpatient Medications  Medication Sig Dispense Refill  . aspirin-acetaminophen-caffeine (EXCEDRIN MIGRAINE) 250-250-65 MG tablet Take 2 tablets by mouth every 6 (six) hours as needed for migraine.    Marland Kitchen buPROPion (WELLBUTRIN XL) 300 MG 24 hr tablet TAKE ONE TABLET BY MOUTH EVERY MORINING FOR DEPRESSION 90 tablet 0  . clonazePAM (KLONOPIN) 1 MG tablet TAKE ONE -TWO TABLETS AT BEDTIME 60 tablet 0  . HYDROcodone-acetaminophen (NORCO/VICODIN) 5-325 MG tablet Take 1 tablet by mouth every 4 (four) hours as needed. 20 tablet 0  . hydrOXYzine (VISTARIL) 25 MG capsule Take 1-2 capsule at  bed time 90 capsule 0  . ibuprofen (ADVIL,MOTRIN) 800 MG tablet Take 1 tablet (800 mg total) by mouth 3 (three) times daily. 21 tablet 0  . lithium 600 MG capsule Take 1 capsule (600 mg total) by mouth 3 (three) times daily with meals. 270 capsule 0  . metoprolol succinate (TOPROL-XL) 25 MG 24 hr tablet Take 12.5 mg by mouth daily.     Marland Kitchen  risperiDONE (RISPERDAL) 2 MG tablet TAKE ONE TABLET BY MOUTH AT BEDTIME AS NEEDED FOR MOOD CONTROL 90 tablet 0  . topiramate (TOPAMAX) 50 MG tablet Take 1 tablet (50 mg total) by mouth daily. 90 tablet 0  . Fluticasone Furoate-Vilanterol (BREO ELLIPTA IN) Inhale into the lungs daily.    . naproxen (NAPROSYN) 500 MG tablet Take 1 tablet (500 mg total) by mouth 2 (two) times daily. (Patient not taking: Reported on 01/04/2019) 14 tablet 0  . ondansetron (ZOFRAN ODT) 4 MG disintegrating tablet Take 1 tablet (4 mg total) by mouth every 8 (eight) hours as needed. (Patient not taking: Reported on 01/04/2019) 20 tablet 6   No current facility-administered medications for this visit.      Musculoskeletal: Strength & Muscle Tone: within normal limits Gait & Station: normal Patient leans: N/A  Psychiatric Specialty Exam: ROS  Blood pressure 123/87, pulse 82, height 5\' 3"  (1.6 m), weight 204 lb (92.5 kg), SpO2 96 %.Body mass index is 36.14 kg/m.  General Appearance: Casual  Eye Contact:  Good  Speech:  Clear and Coherent and Slow  Volume:  Normal  Mood:  Euthymic  Affect:  Appropriate  Thought Process:  Goal Directed  Orientation:  Full (Time, Place, and Person)  Thought Content: Logical   Suicidal Thoughts:  No  Homicidal Thoughts:  No  Memory:  Immediate;   Good Recent;   Good Remote;   Good  Judgement:  Good  Insight:  Good  Psychomotor Activity:  Normal  Concentration:  Concentration: Good and Attention Span: Good  Recall:  Good  Fund of Knowledge: Good  Language: Good  Akathisia:  No  Handed:  Right  AIMS (if indicated): not done  Assets:   Communication Skills Desire for Improvement Housing Resilience Social Support  ADL's:  Intact  Cognition: WNL  Sleep:  Too much   Screenings: AUDIT     Admission (Discharged) from 09/08/2013 in [facility]  Alcohol Use Disorder Identification Test Final Score (AUDIT)  0       Assessment and Plan: Bipolar disorder type I.  Generalized anxiety disorder.  Patient is compliant with medication but experiencing too much sleep and feeling tired next day.  She is taking multiple medication.  We discussed cutting down her medication.  Her headaches are stable and I recommended to cut down Topamax 25 mg at bedtime, reduce Klonopin 1 mg only at bedtime and reduce hydroxyzine 10 mg as needed for anxiety.  She will continue Wellbutrin XL 300 mg daily and Risperdal 2 mg at bedtime.  I reminded that if she feels worsening of the symptoms or if she has any question then she should call us immediately.  We discussed polypharmacy.  Patient is not interested in therapy.  Recommended to call us back if needed.  Follow-up in 3 months.   [name], MD 01/04/2019, 2:23 PM

## 2019-02-05 ENCOUNTER — Telehealth (HOSPITAL_COMMUNITY): Payer: Self-pay

## 2019-02-05 DIAGNOSIS — Z79899 Other long term (current) drug therapy: Secondary | ICD-10-CM

## 2019-02-05 NOTE — Telephone Encounter (Signed)
Patient is calling because she said you wanted to get labs but she did not get orders, did you want patient to get labs? If so, let me know what you want ordered and I will fax the order to the [facility] where the patient usually goes.

## 2019-02-09 NOTE — Telephone Encounter (Signed)
Yes we need lab.  Please order CBC, CMP, hemoglobin A1c, lithium level and TSH.

## 2019-02-13 NOTE — Telephone Encounter (Signed)
Orders faxed to [facility] in [place] - I called patient and lvm letting her know

## 2019-02-24 LAB — CBC WITH DIFFERENTIAL/PLATELET
Absolute Monocytes: 697 cells/uL (ref 200–950)
Basophils Absolute: 42 cells/uL (ref 0–200)
Basophils Relative: 0.4 %
Eosinophils Absolute: 135 cells/uL (ref 15–500)
Eosinophils Relative: 1.3 %
HCT: 43.1 % (ref 35.0–45.0)
Hemoglobin: 14.8 g/dL (ref 11.7–15.5)
Lymphs Abs: 1945 cells/uL (ref 850–3900)
MCH: 31.4 pg (ref 27.0–33.0)
MCHC: 34.3 g/dL (ref 32.0–36.0)
MCV: 91.3 fL (ref 80.0–100.0)
MPV: 10.9 fL (ref 7.5–12.5)
Monocytes Relative: 6.7 %
Neutro Abs: 7582 cells/uL (ref 1500–7800)
Neutrophils Relative %: 72.9 %
Platelets: 304 10*3/uL (ref 140–400)
RBC: 4.72 10*6/uL (ref 3.80–5.10)
RDW: 11.9 % (ref 11.0–15.0)
Total Lymphocyte: 18.7 %
WBC: 10.4 10*3/uL (ref 3.8–10.8)

## 2019-02-24 LAB — COMPREHENSIVE METABOLIC PANEL
AG Ratio: 1.9 (calc) (ref 1.0–2.5)
ALT: 11 U/L (ref 6–29)
AST: 13 U/L (ref 10–35)
Albumin: 4.3 g/dL (ref 3.6–5.1)
Alkaline phosphatase (APISO): 104 U/L (ref 37–153)
BUN/Creatinine Ratio: 11 (calc) (ref 6–22)
BUN: 13 mg/dL (ref 7–25)
CO2: 22 mmol/L (ref 20–32)
Calcium: 9.7 mg/dL (ref 8.6–10.4)
Chloride: 110 mmol/L (ref 98–110)
Creat: 1.22 mg/dL — ABNORMAL HIGH (ref 0.50–1.05)
Globulin: 2.3 g/dL (calc) (ref 1.9–3.7)
Glucose, Bld: 89 mg/dL (ref 65–139)
Potassium: 3.9 mmol/L (ref 3.5–5.3)
Sodium: 140 mmol/L (ref 135–146)
Total Bilirubin: 0.5 mg/dL (ref 0.2–1.2)
Total Protein: 6.6 g/dL (ref 6.1–8.1)

## 2019-02-24 LAB — HEMOGLOBIN A1C
Hgb A1c MFr Bld: 4.8 % of total Hgb (ref <5.7)
Mean Plasma Glucose: 91 (calc)
eAG (mmol/L): 5 (calc)

## 2019-02-24 LAB — TSH: TSH: 1.33 mIU/L

## 2019-02-24 LAB — LITHIUM LEVEL: Lithium Lvl: 0.9 mmol/L (ref 0.6–1.2)

## 2019-03-23 ENCOUNTER — Other Ambulatory Visit (HOSPITAL_COMMUNITY): Payer: Self-pay | Admitting: Psychiatry

## 2019-03-23 DIAGNOSIS — F319 Bipolar disorder, unspecified: Secondary | ICD-10-CM

## 2019-03-30 ENCOUNTER — Telehealth (HOSPITAL_COMMUNITY): Payer: Self-pay

## 2019-03-30 DIAGNOSIS — F319 Bipolar disorder, unspecified: Secondary | ICD-10-CM

## 2019-03-30 MED ORDER — CLONAZEPAM 1 MG PO TABS: 1.0000 mg | ORAL_TABLET | Freq: Every day | ORAL | 0 refills | Status: DC

## 2019-03-30 NOTE — Telephone Encounter (Signed)
Telephone call with patient to inform Dr. Adele Schilder had sent in her requested [facility] [place]

## 2019-03-30 NOTE — Telephone Encounter (Signed)
Send at [place], [place].

## 2019-03-30 NOTE — Telephone Encounter (Signed)
Medication refill request - Telephone call with patient to inform her message was received for request of a refill of her Clonazepam, last ordered 01/04/19 + 1 refill. Patient states out prior to appt set for 04/06/19.  Agreed to send refill request to Dr. Adele Schilder.

## 2019-04-06 ENCOUNTER — Other Ambulatory Visit: Payer: Self-pay

## 2019-04-06 ENCOUNTER — Ambulatory Visit (INDEPENDENT_AMBULATORY_CARE_PROVIDER_SITE_OTHER): Payer: Medicare PPO | Admitting: Psychiatry

## 2019-04-06 DIAGNOSIS — F411 Generalized anxiety disorder: Secondary | ICD-10-CM

## 2019-04-06 DIAGNOSIS — F319 Bipolar disorder, unspecified: Secondary | ICD-10-CM | POA: Diagnosis not present

## 2019-04-06 MED ORDER — HYDROXYZINE HCL 10 MG PO TABS: 10.0000 mg | ORAL_TABLET | Freq: Every evening | ORAL | 0 refills | Status: DC | PRN

## 2019-04-06 MED ORDER — BUPROPION HCL ER (XL) 300 MG PO TB24: ORAL_TABLET | ORAL | 0 refills | Status: DC

## 2019-04-06 MED ORDER — RISPERIDONE 2 MG PO TABS: ORAL_TABLET | ORAL | 0 refills | Status: DC

## 2019-04-06 MED ORDER — CLONAZEPAM 1 MG PO TABS: 1.0000 mg | ORAL_TABLET | Freq: Every day | ORAL | 2 refills | Status: DC

## 2019-04-06 MED ORDER — LITHIUM CARBONATE 600 MG PO CAPS: 600.0000 mg | ORAL_CAPSULE | Freq: Three times a day (TID) | ORAL | 0 refills | Status: DC

## 2019-04-06 MED ORDER — TOPIRAMATE 50 MG PO TABS: 50.0000 mg | ORAL_TABLET | Freq: Every day | ORAL | 0 refills | Status: DC

## 2019-04-06 NOTE — Progress Notes (Signed)
Virtual Visit via Telephone Note  I connected with Toy Baker on 04/06/19 at  2:00 PM EDT by telephone and verified that I am speaking with the correct person using two identifiers.   I discussed the limitations, risks, security and privacy concerns of performing an evaluation and management service by telephone and the availability of in person appointments. I also discussed with the patient that there may be a patient responsible charge related to this service. The patient expressed understanding and agreed to proceed.   History of Present Illness: Patient was evaluated through phone session.  She endorsed since cut down the Topamax to 25 mg she had a bad headache and she went to see her general physician who recommended to go back on Topamax 50 mg.  She is back on 50 mg Topamax and she is sleeping better.  She feels sometimes anxious due to pandemic coronavirus and taking hydroxyzine as needed.  Last week she accidentally cut her finger and required stitches.  She had a blood work recently and her hemoglobin A1c CBC TSH were normal.  Her lithium level is 0.9 and her comprehensive metabolic panel shows creatinine 1.22.  She has been drinking enough water.  She is also very happy because she is expecting grandchild in September.  We have cut down Topamax because of excessive sleep but now she feels her sleeping good and tolerating Topamax much better.  She reported no tremors, shakes or any EPS.  She like to continue her current medication.  She lives with her husband who is very supportive.  She does face time with her son who is also very supportive.  She reported her energy level is good.  Past Psychiatric History: Reviewed. History of bipolar disorder with multiple psychiatric inpatient treatment. Last admission in September 2014 for suicidal thinking, hallucination as playing with guns and superficially cut her wrist with a needle. History of mania and psychosis.Tried Zyprexa Seroquel but  stopped due to weight gain. Tegretol causedincreased paranoia confusion and required inpatient.   Recent Results (from the past 2160 hour(s))  HgB A1c     Status: None   Collection Time: 02/23/19  2:12 PM  Result Value Ref Range   Hgb A1c MFr Bld 4.8 <5.7 % of total Hgb    Comment: For the purpose of screening for the presence of diabetes: . <5.7%       Consistent with the absence of diabetes 5.7-6.4%    Consistent with increased risk for diabetes             (prediabetes) > or =6.5%  Consistent with diabetes . This assay result is consistent with a decreased risk of diabetes. . Currently, no consensus exists regarding use of hemoglobin A1c for diagnosis of diabetes in children. . According to American Diabetes Association (ADA) guidelines, hemoglobin A1c <7.0% represents optimal control in non-pregnant diabetic patients. Different metrics may apply to specific patient populations.  Standards of Medical Care in Diabetes(ADA). .    Mean Plasma Glucose 91 (calc)   eAG (mmol/L) 5.0 (calc)  Comprehensive metabolic panel     Status: Abnormal   Collection Time: 02/23/19  2:12 PM  Result Value Ref Range   Glucose, Bld 89 65 - 139 mg/dL    Comment: .        Non-fasting reference interval .    BUN 13 7 - 25 mg/dL   Creat 1.22 (H) 0.50 - 1.05 mg/dL    Comment: For patients >62 years of age, the reference  limit for Creatinine is approximately 13% higher for people identified as African-American. .    BUN/Creatinine Ratio 11 6 - 22 (calc)   Sodium 140 135 - 146 mmol/L   Potassium 3.9 3.5 - 5.3 mmol/L   Chloride 110 98 - 110 mmol/L   CO2 22 20 - 32 mmol/L   Calcium 9.7 8.6 - 10.4 mg/dL   Total Protein 6.6 6.1 - 8.1 g/dL   Albumin 4.3 3.6 - 5.1 g/dL   Globulin 2.3 1.9 - 3.7 g/dL (calc)   AG Ratio 1.9 1.0 - 2.5 (calc)   Total Bilirubin 0.5 0.2 - 1.2 mg/dL   Alkaline phosphatase (APISO) 104 37 - 153 U/L   AST 13 10 - 35 U/L   ALT 11 6 - 29 U/L  TSH     Status: None    Collection Time: 02/23/19  2:12 PM  Result Value Ref Range   TSH 1.33 mIU/L    Comment:           Reference Range .           > or = 20 Years  0.40-4.50 .                Pregnancy Ranges           First trimester    0.26-2.66           Second trimester   0.55-2.73           Third trimester    0.43-2.91   CBC with Differential/Platelet     Status: None   Collection Time: 02/23/19  2:12 PM  Result Value Ref Range   WBC 10.4 3.8 - 10.8 Thousand/uL   RBC 4.72 3.80 - 5.10 Million/uL   Hemoglobin 14.8 11.7 - 15.5 g/dL   HCT 43.1 35.0 - 45.0 %   MCV 91.3 80.0 - 100.0 fL   MCH 31.4 27.0 - 33.0 pg   MCHC 34.3 32.0 - 36.0 g/dL   RDW 11.9 11.0 - 15.0 %   Platelets 304 140 - 400 Thousand/uL   MPV 10.9 7.5 - 12.5 fL   Neutro Abs 7,582 1,500 - 7,800 cells/uL   Lymphs Abs 1,945 850 - 3,900 cells/uL   Absolute Monocytes 697 200 - 950 cells/uL   Eosinophils Absolute 135 15 - 500 cells/uL   Basophils Absolute 42 0 - 200 cells/uL   Neutrophils Relative % 72.9 %   Total Lymphocyte 18.7 %   Monocytes Relative 6.7 %   Eosinophils Relative 1.3 %   Basophils Relative 0.4 %  Lithium level     Status: None   Collection Time: 02/23/19  2:12 PM  Result Value Ref Range   Lithium Lvl 0.9 0.6 - 1.2 mmol/L     Observations/Objective: Brief mental status examination done on the phone.  Patient describes her mood anxious.  Her speech is slow but clear and coherent.  Her thought process logical.  Her attention and concentration is fair.  She denies any auditory or visual hallucination.  She denies any active or passive suicidal thoughts or homicidal thought.  There were no delusions, paranoia or grandiosity.  She is alert and oriented x3.  Her cognition is intact.  Her fund of knowledge is adequate.  Her insight judgment is good.  Assessment and Plan: Bipolar disorder type I.  Generalized anxiety disorder.  Discussed current situation related to pandemic coronavirus.  Recommended to stay safe and stay  home.  We will resume Topamax 50 mg as  she is having headaches when we cut down the dose.  Recommended to continue hydroxyzine 10 mg to help her anxiety, continue Wellbutrin XL 3 mg daily, Resporal 2 mg at bedtime and Klonopin 1 mg at bedtime.  She will continue lithium 600 mg 3 times a day.  I discussed medication side effects and benefits.  I reviewed blood work results including lithium level with the patient.  Encouraged to increase intake of water and hydrate herself.  Recommended to call us back if she has any question or any concern.  Follow-up in 3 months.  Follow Up Instructions:    I discussed the assessment and treatment plan with the patient. The patient was provided an opportunity to ask questions and all were answered. The patient agreed with the plan and demonstrated an understanding of the instructions.   The patient was advised to call back or seek an in-person evaluation if the symptoms worsen or if the condition fails to improve as anticipated.  I provided 20 minutes of non-face-to-face time during this encounter.   [name], MD

## 2019-06-07 ENCOUNTER — Other Ambulatory Visit (HOSPITAL_COMMUNITY): Payer: Self-pay | Admitting: Psychiatry

## 2019-06-07 DIAGNOSIS — F319 Bipolar disorder, unspecified: Secondary | ICD-10-CM

## 2019-06-07 DIAGNOSIS — F411 Generalized anxiety disorder: Secondary | ICD-10-CM

## 2019-06-29 ENCOUNTER — Other Ambulatory Visit (HOSPITAL_COMMUNITY): Payer: Self-pay | Admitting: Psychiatry

## 2019-06-29 DIAGNOSIS — F411 Generalized anxiety disorder: Secondary | ICD-10-CM

## 2019-07-15 ENCOUNTER — Ambulatory Visit (HOSPITAL_COMMUNITY): Payer: Medicare PPO | Admitting: Psychiatry

## 2019-07-22 ENCOUNTER — Ambulatory Visit (INDEPENDENT_AMBULATORY_CARE_PROVIDER_SITE_OTHER): Payer: Medicare PPO | Admitting: Psychiatry

## 2019-07-22 ENCOUNTER — Other Ambulatory Visit: Payer: Self-pay

## 2019-07-22 ENCOUNTER — Encounter (HOSPITAL_COMMUNITY): Payer: Self-pay | Admitting: Psychiatry

## 2019-07-22 DIAGNOSIS — F319 Bipolar disorder, unspecified: Secondary | ICD-10-CM

## 2019-07-22 DIAGNOSIS — F09 Unspecified mental disorder due to known physiological condition: Secondary | ICD-10-CM | POA: Diagnosis not present

## 2019-07-22 DIAGNOSIS — F411 Generalized anxiety disorder: Secondary | ICD-10-CM | POA: Diagnosis not present

## 2019-07-22 MED ORDER — CLONAZEPAM 1 MG PO TABS: 1.0000 mg | ORAL_TABLET | Freq: Every day | ORAL | 2 refills | Status: DC

## 2019-07-22 MED ORDER — LITHIUM CARBONATE 600 MG PO CAPS: 600.0000 mg | ORAL_CAPSULE | Freq: Three times a day (TID) | ORAL | 0 refills | Status: DC

## 2019-07-22 MED ORDER — BUPROPION HCL ER (XL) 300 MG PO TB24: ORAL_TABLET | ORAL | 0 refills | Status: DC

## 2019-07-22 MED ORDER — RISPERIDONE 2 MG PO TABS: ORAL_TABLET | ORAL | 0 refills | Status: DC

## 2019-07-22 NOTE — Progress Notes (Signed)
Virtual Visit via Telephone Note  I connected with [name] on 07/22/19 at  2:00 PM EDT by telephone and verified that I am speaking with the correct person using two identifiers.   I discussed the limitations, risks, security and privacy concerns of performing an evaluation and management service by telephone and the availability of in person appointments. I also discussed with the patient that there may be a patient responsible charge related to this service. The patient expressed understanding and agreed to proceed.   History of Present Illness: Patient was evaluated through phone session.  She is taking her medication but lately she has noticed having memory problem.  She sometimes gets forgetful.  Does not remember driving directions.  She admitted getting frustrated with her memory problem.  She has MRI scheduled next Tuesday at [name] recommended by Dr. Tommi Rumps who is her neurosurgeon.  She has seen neurology at [facility] neurology but not happy with the physician.  Patient has meningioma and she follow-ups with Dr. Tommi Rumps.  Other than memory problems she feels things are going well.  She is not happy that she has to stay home due to COVID-19 but she has no other choice.  She is sleeping good.  She denies any mania or any irritability.  She denies any paranoia, hallucination or any suicidal thoughts.  She lives with her husband who is supportive.  She is pleased that she able to do face time with her son who is very supportive.  Her appetite is okay.  She reported her weight is stable.  She had blood work done in March and her lithium level was 0.9 and her creatinine was 1.22.    Past Psychiatric History:Reviewed. History of bipolar disorder with multiple psychiatric inpatient treatment. Last admissioninSeptember 2014 for suicidal thinking,hallucinationasplaying with guns and superficially cut her wrist with a needle. History of mania and psychosis.Tried Zyprexa Seroquelbut stopped  due toweight gain. Tegretol causedincreased paranoia confusion and required inpatient.   Psychiatric Specialty Exam: Physical Exam  ROS  There were no vitals taken for this visit.There is no height or weight on file to calculate BMI.  General Appearance: NA  Eye Contact:  NA  Speech:  Clear and Coherent and Slow  Volume:  Decreased  Mood:  Anxious  Affect:  NA  Thought Process:  Goal Directed  Orientation:  Full (Time, Place, and Person)  Thought Content:  Logical  Suicidal Thoughts:  No  Homicidal Thoughts:  No  Memory:  Immediate;   Fair Recent;   Fair Remote;   Fair  Judgement:  Good  Insight:  Good  Psychomotor Activity:  NA  Concentration:  Concentration: Fair and Attention Span: Fair  Recall:  [employer] of Knowledge:  Fair  Language:  Good  Akathisia:  No  Handed:  Right  AIMS (if indicated):     Assets:  Communication Skills Desire for Improvement Housing Resilience Social Support  ADL's:  Intact  Cognition:  Impaired,  Mild  Sleep:   ok      Assessment and Plan: Bipolar disorder type I.  Generalized anxiety disorder.  Mild cognitive impairment  Discussed her memory issues.  She has meningioma and she is going to follow-up with a neurosurgeon to see if any changes.  We have recommended to cut down Topamax to 25 mg but she started to have headaches and she went back to 50 mg.  I recommend to discontinue hydroxyzine to see if her attention and concentration get better.  I do  believe she should see neurology to rule out any organic cause of memory impairment.  She is taking Risperdal, Klonopin, lithium and Wellbutrin for a long time and do not report any side effects.  She has no tremors or shakes.  Her lithium level is therapeutic.  She has mild tremors but that does not interfere in her daily activity.  Patient will follow-up with neurosurgeon and I recommend that she can also ask him to refer neurology.  Recommended to call us back if she has any question or  any concern.  She like to continue her current medication.  Continue Wellbutrin XL 300 mg daily, Risperdal 2 mg at bedtime, Klonopin 1 mg at bedtime Topamax 50 mg at bedtime and lithium 600 mg 3 times a day.  She is very reluctant to cut down the medication but willing to stop the hydroxyzine for now to see if that is causing memory problems.  Discussed polypharmacy could be an etiology for her memory problem however this seems to be a new issue and need further work-up.  Follow-up in 3 months.    Follow Up Instructions:    I discussed the assessment and treatment plan with the patient. The patient was provided an opportunity to ask questions and all were answered. The patient agreed with the plan and demonstrated an understanding of the instructions.   The patient was advised to call back or seek an in-person evaluation if the symptoms worsen or if the condition fails to improve as anticipated.  I provided 20 minutes of non-face-to-face time during this encounter.   [name], MD

## 2019-08-04 ENCOUNTER — Encounter: Payer: Self-pay | Admitting: Internal Medicine

## 2019-08-04 ENCOUNTER — Ambulatory Visit (INDEPENDENT_AMBULATORY_CARE_PROVIDER_SITE_OTHER): Payer: Medicare PPO

## 2019-08-04 ENCOUNTER — Ambulatory Visit: Payer: Medicare PPO | Admitting: Internal Medicine

## 2019-08-04 ENCOUNTER — Other Ambulatory Visit: Payer: Self-pay

## 2019-08-04 DIAGNOSIS — R0609 Other forms of dyspnea: Secondary | ICD-10-CM | POA: Diagnosis not present

## 2019-08-04 DIAGNOSIS — R0600 Dyspnea, unspecified: Secondary | ICD-10-CM | POA: Insufficient documentation

## 2019-08-04 LAB — CBC WITH DIFFERENTIAL/PLATELET
Basophils Absolute: 0.1 10*3/uL (ref 0.0–0.1)
Basophils Relative: 0.6 % (ref 0.0–3.0)
Eosinophils Absolute: 0.1 10*3/uL (ref 0.0–0.7)
Eosinophils Relative: 0.9 % (ref 0.0–5.0)
HCT: 43 % (ref 36.0–46.0)
Hemoglobin: 14.6 g/dL (ref 12.0–15.0)
Lymphocytes Relative: 20.9 % (ref 12.0–46.0)
Lymphs Abs: 1.9 10*3/uL (ref 0.7–4.0)
MCHC: 33.9 g/dL (ref 30.0–36.0)
MCV: 93.3 fl (ref 78.0–100.0)
Monocytes Absolute: 0.6 10*3/uL (ref 0.1–1.0)
Monocytes Relative: 6.5 % (ref 3.0–12.0)
Neutro Abs: 6.5 10*3/uL (ref 1.4–7.7)
Neutrophils Relative %: 71.1 % (ref 43.0–77.0)
Platelets: 273 10*3/uL (ref 150.0–400.0)
RBC: 4.61 Mil/uL (ref 3.87–5.11)
RDW: 13.1 % (ref 11.5–15.5)
WBC: 9.1 10*3/uL (ref 4.0–10.5)

## 2019-08-04 LAB — BASIC METABOLIC PANEL
BUN: 14 mg/dL (ref 6–23)
CO2: 22 mEq/L (ref 19–32)
Calcium: 9.9 mg/dL (ref 8.4–10.5)
Chloride: 109 mEq/L (ref 96–112)
Creatinine, Ser: 0.92 mg/dL (ref 0.40–1.20)
GFR: 63.11 mL/min (ref >60.00)
Glucose, Bld: 99 mg/dL (ref 70–99)
Potassium: 3.9 mEq/L (ref 3.5–5.1)
Sodium: 140 mEq/L (ref 135–145)

## 2019-08-04 LAB — TSH: TSH: 1.35 u[IU]/mL (ref 0.35–4.50)

## 2019-08-04 LAB — BRAIN NATRIURETIC PEPTIDE: Pro B Natriuretic peptide (BNP): 20 pg/mL (ref 0.0–100.0)

## 2019-08-04 LAB — SEDIMENTATION RATE: Sed Rate: 11 mm/hr (ref 0–30)

## 2019-08-04 NOTE — Patient Instructions (Signed)
To get the most out of exercise, you need to be continuously aware that you are short of breath, but never out of breath, for 20-30 minutes daily. As you improve, it will actually be easier for you to do the same amount of exercise  in  30 minutes so always push to the level where you are short of breath.    Please remember to go to the lab and x-ray department   for your tests - we will call you with the results when they are available and go over the next in the work up

## 2019-08-04 NOTE — Progress Notes (Signed)
Spoke with pt and notified of results per Dr. Wert. Pt verbalized understanding and denied any questions. 

## 2019-08-04 NOTE — Progress Notes (Signed)
[name], female    DOB: 07-09-1961     MRN: [id number]   Brief patient profile:  89 yowf never asthma, allergies or h/o any smoking but "never did any sports or aerobics with baseline wt p last IUP 1988 wt 140 and gradually gained wt with doe at around 200 lb in aug 2019 and gradually worse with no better on BREO by [facility]= pulm in [place] ,  but no additional tests due to [place] 19 so self referred to pulmonary clinic 08/04/2019      History of Present Illness  08/04/2019  Pulmonary/ 1st office eval/Guida Asman  Chief Complaint  Patient presents with   Pulmonary Consult    Self referral for SOB for the past year. She gets SOB "usually if I do anything"- talking, bending over.   Dyspnea:  MMRC3 = can't walk 100 yards even at a slow pace at a flat grade s stopping due to sob   Cough: no Sleep: no problems -  able to lie flat SABA use: none  Breathing much worse with speaking or bending over   No obvious day to day or daytime variability or assoc excess/ purulent sputum or mucus plugs or hemoptysis or cp or chest tightness, subjective wheeze or overt sinus or hb symptoms.   Sleeping  without nocturnal  or early am exacerbation  of respiratory  c/o's or need for noct saba. Also denies any obvious fluctuation of symptoms with weather or environmental changes or other aggravating or alleviating factors except as outlined above   No unusual exposure hx or h/o childhood pna/ asthma or knowledge of premature birth.  Current Allergies, Complete Past Medical History, Past Surgical History, Family History, and Social History were reviewed in [employer] record.  ROS  The following are not active complaints unless bolded Hoarseness, sore throat, dysphagia, dental problems, itching, sneezing,  nasal congestion or discharge of excess mucus or purulent secretions, ear ache,   fever, chills, sweats, unintended wt loss or wt gain, classically pleuritic or exertional cp,  orthopnea  pnd or arm/hand swelling  or leg swelling, presyncope, palpitations, abdominal pain, anorexia, nausea, vomiting, diarrhea  or change in bowel habits or change in bladder habits, change in stools or change in urine, dysuria, hematuria,  rash, arthralgias, visual complaints, headache, numbness, weakness or ataxia or problems with walking or coordination,  change in mood or  memory.                   Past Medical History:  Diagnosis Date   Anxiety    Bipolar disorder ([place])    Brain tumor (benign) ([place])    Depression    HTN (hypertension)    Mania ([place])    Meningioma ([facility])    Migraine     Outpatient Medications Prior to Visit  Medication Sig Dispense Refill   aspirin-acetaminophen-caffeine (EXCEDRIN MIGRAINE) 250-250-65 MG tablet Take 2 tablets by mouth every 6 (six) hours as needed for migraine.     buPROPion (WELLBUTRIN XL) 300 MG 24 hr tablet TAKE ONE TABLET BY MOUTH EVERY MORINING FOR DEPRESSION 90 tablet 0   clonazePAM (KLONOPIN) 1 MG tablet Take 1 tablet (1 mg total) by mouth at bedtime. 30 tablet 2   HYDROcodone-acetaminophen (NORCO/VICODIN) 5-325 MG tablet Take 1 tablet by mouth every 4 (four) hours as needed. 20 tablet 0   ibuprofen (ADVIL,MOTRIN) 800 MG tablet Take 1 tablet (800 mg total) by mouth 3 (three) times daily. 21 tablet 0  lithium 600 MG capsule Take 1 capsule (600 mg total) by mouth 3 (three) times daily with meals. 270 capsule 0   metoprolol succinate (TOPROL-XL) 25 MG 24 hr tablet Take 12.5 mg by mouth daily.      risperiDONE (RISPERDAL) 2 MG tablet TAKE ONE TABLET BY MOUTH AT BEDTIME AS NEEDED FOR MOOD CONTROL 90 tablet 0   topiramate (TOPAMAX) 50 MG tablet Take 1 tablet (50 mg total) by mouth daily. 90 tablet 0          hydrOXYzine (ATARAX/VISTARIL) 10 MG tablet Take 1 tablet (10 mg total) by mouth at bedtime as needed. 90 tablet 0         Objective:     BP 124/88 (BP Location: Left Arm, Cuff Size: Normal)    Pulse 92    Temp 98.3  F (36.8 C) (Oral)    Ht 5\' 3"  (1.6 m)    Wt 200 lb (90.7 kg)    SpO2 97% Comment: on RA   BMI 35.43 kg/m   SpO2: 97 %(on RA)  RA  amb mod obese wf nad   HEENT: nl dentition, turbinates bilaterally, and oropharynx. Nl external ear canals without cough reflex   NECK :  without JVD/Nodes/TM/ nl carotid upstrokes bilaterally   LUNGS: no acc muscle use,  Nl contour chest which is clear to A and P bilaterally without cough on insp or exp maneuvers   CV:  RRR  no s3 or murmur or increase in P2, and no edema   ABD:  soft and nontender with nl inspiratory excursion in the supine position. No bruits or organomegaly appreciated, bowel sounds nl  MS:  Nl gait/ ext warm without deformities, calf tenderness, cyanosis or clubbing No obvious joint restrictions   SKIN: warm and dry without lesions    NEURO:  alert, approp, nl sensorium with  no motor or cerebellar deficits apparent.    CXR PA and Lateral:   08/04/2019 :    I personally reviewed images and agree with radiology impression as follows:    No active cardiopulmonary disease.   Labs ordered/ reviewed:      Chemistry      Component Value Date/Time   NA 140 08/04/2019 1211   K 3.9 08/04/2019 1211   CL 109 08/04/2019 1211   CO2 22 08/04/2019 1211   BUN 14 08/04/2019 1211   CREATININE 0.92 08/04/2019 1211   CREATININE 1.22 (H) 02/23/2019 1412      Component Value Date/Time   CALCIUM 9.9 08/04/2019 1211   ALKPHOS 100 12/16/2017 1259   AST 13 02/23/2019 1412   ALT 11 02/23/2019 1412   BILITOT 0.5 02/23/2019 1412        Lab Results  Component Value Date   WBC 9.1 08/04/2019   HGB 14.6 08/04/2019   HCT 43.0 08/04/2019   MCV 93.3 08/04/2019   PLT 273.0 08/04/2019        EOS                                                             0.1  08/04/2019   Lab Results  Component Value Date   DDIMER <0.19 08/04/2019      Lab Results  Component Value Date   TSH 1.35 08/04/2019      Lab Results  Component Value Date   PROBNP 20.0 08/04/2019       Lab Results  Component Value Date   ESRSEDRATE 11 08/04/2019        Labs ordered 08/04/2019  :  allergy profile        Assessment   DOE (dyspnea on exertion) Onset summer 2019 and no better on BREO - 08/04/2019   Walked RA  2 laps @  approx 217ft each @ fast pace  stopped due to  End of study, mild sob, sats 96%   - Allergy profile 08/04/2019 >  Eos 0.1 /  IgE  Pending   Symptoms are markedly disproportionate to objective findings and not clear to what extent this is actually a pulmonary  problem but pt does appear to have difficult to sort out respiratory symptoms of unknown origin for which  DDX  = almost all start with A and  include Adherence, Ace Inhibitors, Acid Reflux, Active Sinus Disease, Alpha 1 Antitripsin deficiency, Anxiety masquerading as Airways dz,  ABPA,  Allergy(esp in young), Aspiration (esp in elderly), Adverse effects of meds,  Active smoking or Vaping, A bunch of PE's/clot burden (a few small clots can't cause this syndrome unless there is already severe underlying pulm or vascular dz with poor reserve),  Anemia or thyroid disorder, plus two Bs  = Bronchiectasis and Beta blocker use..and one C= CHF     Adherence is always the initial "prime suspect" and is a multilayered concern that requires a "trust but verify" approach in every patient - starting with knowing how to use medications, especially inhalers, correctly, keeping up with refills and understanding the fundamental difference between maintenance and prns vs those medications only taken for a very short course and then stopped and not refilled.   ? Anxiety/depression/ deconditioning > usually at the bottom of this list of usual suspects but should be much higher on this pt's based on H and P and note already on psychotropics and may interfere with adherence and also interpretation of response or lack thereof to symptom management which can be  quite subjective.    ? Allergy/ asthma >  Doubt without assoc rhintis, noct symptoms and note No better on breo, send profile to be complete   Anemia/ thryroid dz ruled out today  A bunch of PE's >  D dimer nl - while a normal  or high normal value (seen commonly in the elderly or chronically ill)  may miss small peripheral pe, the clot burden with sob is moderately high and the d dimer  has a very high neg pred value if used in this setting.    ? BB effects > very unlikely on such a low dose of toprol   CHF > excluded with bnp so low     >>> rec reconditioning then consider cpst next    Total time devoted to counseling  > 50 % of initial 60 min office visit:  reviewed case with pt/  directly observed portions of ambulatory 02 saturation study/ discussion of options/alternatives/ personally creating written customized instructions  in presence of pt  then going over those specific  Instructions directly with the pt including how to use all of the meds but in particular covering each new medication in detail and the  difference between the maintenance= "automatic" meds and the prns using an action plan format for the latter (If this problem/symptom => do that organization reading Left to right).  Please see AVS from this visit for a full list of these instructions which I personally wrote for this pt and  are unique to this visit.      Christinia Gully, MD 08/04/2019

## 2019-08-05 ENCOUNTER — Encounter: Payer: Self-pay | Admitting: Internal Medicine

## 2019-08-05 LAB — RESPIRATORY ALLERGY PROFILE REGION II ~~LOC~~

## 2019-08-05 LAB — INTERPRETATION:

## 2019-08-05 LAB — D-DIMER, QUANTITATIVE: D-Dimer, Quant: <0.19 mcg/mL FEU (ref <0.50)

## 2019-08-05 NOTE — Assessment & Plan Note (Addendum)
Onset summer 2019 and no better on BREO - 08/04/2019   Walked RA  2 laps @  approx 239ft each @ fast pace  stopped due to  End of study, mild sob, sats 96%   - Allergy profile 08/04/2019 >  Eos 0.1 /  IgE    Symptoms are markedly disproportionate to objective findings and not clear to what extent this is actually a pulmonary  problem but pt does appear to have difficult to sort out respiratory symptoms of unknown origin for which  DDX  = almost all start with A and  include Adherence, Ace Inhibitors, Acid Reflux, Active Sinus Disease, Alpha 1 Antitripsin deficiency, Anxiety masquerading as Airways dz,  ABPA,  Allergy(esp in young), Aspiration (esp in elderly), Adverse effects of meds,  Active smoking or Vaping, A bunch of PE's/clot burden (a few small clots can't cause this syndrome unless there is already severe underlying pulm or vascular dz with poor reserve),  Anemia or thyroid disorder, plus two Bs  = Bronchiectasis and Beta blocker use..and one C= CHF     Adherence is always the initial "prime suspect" and is a multilayered concern that requires a "trust but verify" approach in every patient - starting with knowing how to use medications, especially inhalers, correctly, keeping up with refills and understanding the fundamental difference between maintenance and prns vs those medications only taken for a very short course and then stopped and not refilled.   ? Anxiety/depression/ deconditioning > usually at the bottom of this list of usual suspects but should be much higher on this pt's based on H and P and note already on psychotropics and may interfere with adherence and also interpretation of response or lack thereof to symptom management which can be quite subjective.    ? Allergy/ asthma >  Doubt without assoc rhintis, noct symptoms and note No better on breo, send profile to be complete   Anemia/ thryroid dz ruled out today  A bunch of PE's >  D dimer nl - while a normal  or high normal  value (seen commonly in the elderly or chronically ill)  may miss small peripheral pe, the clot burden with sob is moderately high and the d dimer  has a very high neg pred value if used in this setting.    ? BB effects > very unlikely on such a low dose of toprol   CHF > excluded with bnp so low     >>> rec reconditioning then consider cpst next    Total time devoted to counseling  > 50 % of initial 60 min office visit:  reviewed case with pt/  directly observed portions of ambulatory 02 saturation study/ discussion of options/alternatives/ personally creating written customized instructions  in presence of pt  then going over those specific  Instructions directly with the pt including how to use all of the meds but in particular covering each new medication in detail and the difference between the maintenance= "automatic" meds and the prns using an action plan format for the latter (If this problem/symptom => do that organization reading Left to right).  Please see AVS from this visit for a full list of these instructions which I personally wrote for this pt and  are unique to this visit.

## 2019-08-05 NOTE — Progress Notes (Signed)
LMTCB

## 2019-08-25 ENCOUNTER — Telehealth (HOSPITAL_COMMUNITY): Payer: Self-pay

## 2019-08-25 NOTE — Telephone Encounter (Signed)
Patient is calling to see if you will send in another prescription of the Hydroxyzine, she states that she started taking it again and it is helping her sleep. Patient also wants you to know that the tremor is still in her hands and she was told by another doctor that it is caused by her medication.

## 2019-08-25 NOTE — Telephone Encounter (Signed)
He stopped the hydroxyzine because she was complaining of dizziness.  If she does not have any dizziness and grogginess I can resume hydroxyzine 10 mg tablet to take as needed which can also help her tremors.  If she saw the neurologist then we will need the records from neurology visit.  Please call hydroxyzine 10 mg tablet as needed to her local pharmacy.

## 2019-08-26 MED ORDER — HYDROXYZINE HCL 10 MG PO TABS: 10.0000 mg | ORAL_TABLET | Freq: Three times a day (TID) | ORAL | 0 refills | Status: DC | PRN

## 2019-08-26 NOTE — Telephone Encounter (Signed)
I called in the Hydroxyzine and then called patient to let her know, she is still concerned about the tremors. I told her to send [place] [facility] records. Patient said she went to a Neuro surgeon who referred her to a pulmonologist - that note is in [place].

## 2019-09-06 ENCOUNTER — Other Ambulatory Visit (HOSPITAL_COMMUNITY): Payer: Self-pay

## 2019-09-06 DIAGNOSIS — F319 Bipolar disorder, unspecified: Secondary | ICD-10-CM

## 2019-09-06 MED ORDER — TOPIRAMATE 50 MG PO TABS: 50.0000 mg | ORAL_TABLET | Freq: Every day | ORAL | 0 refills | Status: DC

## 2019-10-19 ENCOUNTER — Other Ambulatory Visit (HOSPITAL_COMMUNITY): Payer: Self-pay | Admitting: Psychiatry

## 2019-10-19 DIAGNOSIS — F411 Generalized anxiety disorder: Secondary | ICD-10-CM

## 2019-10-19 DIAGNOSIS — F319 Bipolar disorder, unspecified: Secondary | ICD-10-CM

## 2019-10-21 ENCOUNTER — Ambulatory Visit (INDEPENDENT_AMBULATORY_CARE_PROVIDER_SITE_OTHER): Payer: Medicare PPO | Admitting: Psychiatry

## 2019-10-21 ENCOUNTER — Other Ambulatory Visit: Payer: Self-pay

## 2019-10-21 ENCOUNTER — Encounter (HOSPITAL_COMMUNITY): Payer: Self-pay | Admitting: Psychiatry

## 2019-10-21 DIAGNOSIS — F319 Bipolar disorder, unspecified: Secondary | ICD-10-CM | POA: Diagnosis not present

## 2019-10-21 DIAGNOSIS — F411 Generalized anxiety disorder: Secondary | ICD-10-CM | POA: Diagnosis not present

## 2019-10-21 MED ORDER — BUPROPION HCL ER (XL) 300 MG PO TB24: ORAL_TABLET | ORAL | 0 refills | Status: DC

## 2019-10-21 MED ORDER — TOPIRAMATE 50 MG PO TABS: 50.0000 mg | ORAL_TABLET | Freq: Every day | ORAL | 0 refills | Status: DC

## 2019-10-21 MED ORDER — CLONAZEPAM 1 MG PO TABS: 1.0000 mg | ORAL_TABLET | Freq: Every day | ORAL | 2 refills | Status: DC

## 2019-10-21 MED ORDER — HYDROXYZINE HCL 10 MG PO TABS: 10.0000 mg | ORAL_TABLET | Freq: Every day | ORAL | 0 refills | Status: DC

## 2019-10-21 MED ORDER — RISPERIDONE 2 MG PO TABS: ORAL_TABLET | ORAL | 0 refills | Status: DC

## 2019-10-21 MED ORDER — LITHIUM CARBONATE 600 MG PO CAPS: 600.0000 mg | ORAL_CAPSULE | Freq: Three times a day (TID) | ORAL | 0 refills | Status: DC

## 2019-10-21 NOTE — Progress Notes (Signed)
Virtual Visit via Telephone Note  I connected with [name] on 10/21/19 at  2:20 PM EDT by telephone and verified that I am speaking with the correct person using two identifiers.   I discussed the limitations, risks, security and privacy concerns of performing an evaluation and management service by telephone and the availability of in person appointments. I also discussed with the patient that there may be a patient responsible charge related to this service. The patient expressed understanding and agreed to proceed.   History of Present Illness: Patient was evaluated by phone session.  She is doing very well on her medication.  She has chronic symptoms of memory impairment, feeling tired and tremors.  She has not seen neurology but recently saw neurosurgeon and she has MRI and she was told everything is unchanged from the past.  She has a blood work which shows creatinine 1.1.  Her TSH and other labs are normal.  Patient told they did not do hemoglobin A1c and lithium level.  Patient had decided to stop the hydroxyzine because she was feeling dizzy but realized it was not the medicine causing it.  She is back on hydroxyzine 10 mg at bedtime.  Her sleep is okay.  Patient realized that she has chronic symptoms and she may have to live with them.  She does not want to change the medication.  She lives with her husband who is supportive.  Her grandchild is now 59 months old.  She really enjoys the company of the grandchild.  Patient denies any paranoia, hallucination, suicidal thoughts or homicidal thoughts.  Past Psychiatric History:Reviewed. History of bipolar disorder with multiple psychiatric inpatient treatment. Last admissioninSeptember 2014 for suicidal thinking,hallucinationasplaying with guns and superficially cut her wrist with a needle. History of mania and psychosis.Tried Zyprexa Seroquelbut stopped due toweight gain. Tegretol causedincreased paranoia confusion and required  inpatient.  Psychiatric Specialty Exam: Physical Exam  ROS  There were no vitals taken for this visit.There is no height or weight on file to calculate BMI.  General Appearance: NA  Eye Contact:  NA  Speech:  Clear and Coherent and Slow  Volume:  Normal  Mood:  Euthymic  Affect:  NA  Thought Process:  Goal Directed  Orientation:  Full (Time, Place, and Person)  Thought Content:  WDL  Suicidal Thoughts:  No  Homicidal Thoughts:  No  Memory:  Immediate;   Fair Recent;   Fair Remote;   Fair  Judgement:  Good  Insight:  Good  Psychomotor Activity:  NA  Concentration:  Concentration: Fair and Attention Span: Fair  Recall:  Good  Fund of Knowledge:  Fair  Language:  Good  Akathisia:  No  Handed:  Right  AIMS (if indicated):     Assets:  Communication Skills Desire for Improvement Housing Resilience Social Support  ADL's:  Intact  Cognition:  Impaired,  Mild  Sleep:   good      Assessment and Plan: Bipolar disorder type I.  Generalized anxiety disorder.  Mild cognitive impairment.  Patient like to continue current medication.  One more time I encouraged that she should see neurology if she feels her memory, headaches and tremors are getting worse.  However she does not feel it is getting worse but they are stable.  I reviewed blood work results.  Her lithium level is not done.  Her hemoglobin A1c was also not done.  Her creatinine is 1.11 which is actually better than before.  Discussed medication side effects and  benefits.  Continue Wellbutrin XL three 1 mg daily, Risperdal 2 mg at bedtime, Klonopin 1 mg at bedtime, Topamax 50 mg at bedtime, lithium 300 mg 3 times a day and hydroxyzine 10 mg at bedtime.  We discussed polypharmacy and also discussed it could be due to polypharmacy causing memory impairment and tremors.  But patient resistant to cut down the medication.  Follow-up in 3 months.  Patient is not interested in therapy.  Follow Up Instructions:    I discussed  the assessment and treatment plan with the patient. The patient was provided an opportunity to ask questions and all were answered. The patient agreed with the plan and demonstrated an understanding of the instructions.   The patient was advised to call back or seek an in-person evaluation if the symptoms worsen or if the condition fails to improve as anticipated.  I provided 20 minutes of non-face-to-face time during this encounter.   [name], MD

## 2019-10-29 ENCOUNTER — Other Ambulatory Visit (HOSPITAL_COMMUNITY): Payer: Self-pay

## 2019-10-29 DIAGNOSIS — F319 Bipolar disorder, unspecified: Secondary | ICD-10-CM

## 2019-10-29 MED ORDER — TOPIRAMATE 50 MG PO TABS: 50.0000 mg | ORAL_TABLET | Freq: Every day | ORAL | 0 refills | Status: DC

## 2019-10-29 MED ORDER — RISPERIDONE 2 MG PO TABS: ORAL_TABLET | ORAL | 0 refills | Status: DC

## 2020-01-24 ENCOUNTER — Other Ambulatory Visit: Payer: Self-pay

## 2020-01-24 ENCOUNTER — Encounter (HOSPITAL_COMMUNITY): Payer: Self-pay | Admitting: Psychiatry

## 2020-01-24 ENCOUNTER — Ambulatory Visit (INDEPENDENT_AMBULATORY_CARE_PROVIDER_SITE_OTHER): Payer: Medicare PPO | Admitting: Psychiatry

## 2020-01-24 DIAGNOSIS — F319 Bipolar disorder, unspecified: Secondary | ICD-10-CM

## 2020-01-24 DIAGNOSIS — F411 Generalized anxiety disorder: Secondary | ICD-10-CM

## 2020-01-24 MED ORDER — TOPIRAMATE 50 MG PO TABS: 50.0000 mg | ORAL_TABLET | Freq: Every day | ORAL | 0 refills | Status: DC

## 2020-01-24 MED ORDER — RISPERIDONE 2 MG PO TABS: ORAL_TABLET | ORAL | 0 refills | Status: DC

## 2020-01-24 MED ORDER — BUPROPION HCL ER (XL) 300 MG PO TB24: ORAL_TABLET | ORAL | 0 refills | Status: DC

## 2020-01-24 MED ORDER — HYDROXYZINE HCL 10 MG PO TABS: 10.0000 mg | ORAL_TABLET | Freq: Every day | ORAL | 0 refills | Status: DC

## 2020-01-24 MED ORDER — CLONAZEPAM 1 MG PO TABS: 1.0000 mg | ORAL_TABLET | Freq: Every day | ORAL | 2 refills | Status: DC

## 2020-01-24 MED ORDER — LITHIUM CARBONATE 600 MG PO CAPS: 600.0000 mg | ORAL_CAPSULE | Freq: Three times a day (TID) | ORAL | 0 refills | Status: DC

## 2020-01-24 NOTE — Progress Notes (Signed)
Virtual Visit via Telephone Note  I connected with [name] on 01/24/20 at  2:00 PM EST by telephone and verified that I am speaking with the correct person using two identifiers.   I discussed the limitations, risks, security and privacy concerns of performing an evaluation and management service by telephone and the availability of in person appointments. I also discussed with the patient that there may be a patient responsible charge related to this service. The patient expressed understanding and agreed to proceed.   History of Present Illness: Patient was evaluated by phone session.  She admitted feeling boredom and sometimes sad because she cannot go outside due to Covid.  She lives with her husband and she was disappointed that unable to see her grandchild and son on the Christmas.  She cooks the food and left on the porch and her son and family came and pick up the phone.  She feels the medicine is helping and she realized her sadness is due to outside situation related to Covid.  Her cognition is same and there are days when she has difficulty remembering things but she is able to do her ADLs.  She is resistant to cut down her medication and we discussed polypharmacy.  She had tried cutting down her Topamax and hydroxyzine but she could not sleep and her anxiety get worse.  She has no tremors, shakes or any EPS.  She denies any mania or psychosis.  She is able to loss 9 pounds since the last visit she is watching her diet closely.   Past Psychiatric History:Reviewed. H/O bipolar disorder and multiple inpatient treatment. Last admissioninSeptember 2014 for suicidal thinking,hallucinationasplaying with guns and superficially cut her wrist with a needle. H/O mania and psychosis.Tried Zyprexa Seroquelbut stopped due toweight gain. Tegretol causedincreased paranoia confusion and required inpatient.   Psychiatric Specialty Exam: Physical Exam  Review of Systems  There were no  vitals taken for this visit.There is no height or weight on file to calculate BMI.  General Appearance: NA  Eye Contact:  NA  Speech:  Clear and Coherent and Slow  Volume:  Normal  Mood:  Euthymic  Affect:  NA  Thought Process:  Goal Directed  Orientation:  Full (Time, Place, and Person)  Thought Content:  Rumination  Suicidal Thoughts:  No  Homicidal Thoughts:  No  Memory:  Immediate;   Fair Recent;   Fair Remote;   Fair  Judgement:  Good  Insight:  Present  Psychomotor Activity:  NA  Concentration:  Concentration: Fair and Attention Span: Fair  Recall:  Good  Fund of Knowledge:  Good  Language:  Good  Akathisia:  No  Handed:  Right  AIMS (if indicated):     Assets:  Communication Skills Desire for Improvement Housing Resilience Social Support  ADL's:  Intact  Cognition:  WNL  Sleep:   ok      Assessment and Plan: Bipolar disorder type I.  Generalized anxiety disorder.  Mild cognitive impairment.  Reassurance given about current Covid situation.  Encouraged to continue to take precaution as hopefully it will be short-term.  Patient does not want to change medication despite discussed polypharmacy side effects.  Continue Wellbutrin XL 300 mg daily, Risperdal 2 mg at bedtime, Klonopin 1 mg at bedtime, Topamax 50 mg at bedtime, lithium 300 mg 3 times a day and hydroxyzine 10 mg as needed for severe anxiety.  Recommended to call us back if she is any question or any concern.  Follow-up in  3 months.  Follow Up Instructions:    I discussed the assessment and treatment plan with the patient. The patient was provided an opportunity to ask questions and all were answered. The patient agreed with the plan and demonstrated an understanding of the instructions.   The patient was advised to call back or seek an in-person evaluation if the symptoms worsen or if the condition fails to improve as anticipated.  I provided 20 minutes of non-face-to-face time during this  encounter.   [name], MD

## 2020-02-11 ENCOUNTER — Emergency Department (HOSPITAL_COMMUNITY): Payer: Medicare PPO

## 2020-02-11 ENCOUNTER — Emergency Department (HOSPITAL_COMMUNITY)
Admission: EM | Admit: 2020-02-11 | Discharge: 2020-02-12 | Disposition: A | Discharge status: Home / Self Care | Payer: Medicare PPO | Attending: Emergency Medicine | Admitting: Emergency Medicine

## 2020-02-11 ENCOUNTER — Other Ambulatory Visit: Payer: Self-pay

## 2020-02-11 ENCOUNTER — Encounter (HOSPITAL_COMMUNITY): Payer: Self-pay | Admitting: Emergency Medicine

## 2020-02-11 DIAGNOSIS — R0789 Other chest pain: Secondary | ICD-10-CM | POA: Diagnosis present

## 2020-02-11 DIAGNOSIS — Z79899 Other long term (current) drug therapy: Secondary | ICD-10-CM | POA: Diagnosis not present

## 2020-02-11 DIAGNOSIS — I10 Essential (primary) hypertension: Secondary | ICD-10-CM | POA: Insufficient documentation

## 2020-02-11 DIAGNOSIS — R0602 Shortness of breath: Secondary | ICD-10-CM | POA: Diagnosis not present

## 2020-02-11 DIAGNOSIS — R1013 Epigastric pain: Secondary | ICD-10-CM | POA: Diagnosis not present

## 2020-02-11 DIAGNOSIS — Z20822 Contact with and (suspected) exposure to covid-19: Secondary | ICD-10-CM | POA: Diagnosis not present

## 2020-02-11 DIAGNOSIS — R079 Chest pain, unspecified: Secondary | ICD-10-CM

## 2020-02-11 LAB — CBC
HCT: 46.7 % — ABNORMAL HIGH (ref 36.0–46.0)
Hemoglobin: 15.1 g/dL — ABNORMAL HIGH (ref 12.0–15.0)
MCH: 30.5 pg (ref 26.0–34.0)
MCHC: 32.3 g/dL (ref 30.0–36.0)
MCV: 94.3 fL (ref 80.0–100.0)
Platelets: 293 10*3/uL (ref 150–400)
RBC: 4.95 MIL/uL (ref 3.87–5.11)
RDW: 12.4 % (ref 11.5–15.5)
WBC: 12 10*3/uL — ABNORMAL HIGH (ref 4.0–10.5)
nRBC: 0 % (ref 0.0–0.2)

## 2020-02-11 LAB — HEPATIC FUNCTION PANEL
ALT: 15 U/L (ref 0–44)
AST: 14 U/L — ABNORMAL LOW (ref 15–41)
Albumin: 4.3 g/dL (ref 3.5–5.0)
Alkaline Phosphatase: 87 U/L (ref 38–126)
Bilirubin, Direct: 0.1 mg/dL (ref 0.0–0.2)
Indirect Bilirubin: 0.5 mg/dL (ref 0.3–0.9)
Total Bilirubin: 0.6 mg/dL (ref 0.3–1.2)
Total Protein: 7.5 g/dL (ref 6.5–8.1)

## 2020-02-11 LAB — TROPONIN I (HIGH SENSITIVITY)
Troponin I (High Sensitivity): <2 ng/L (ref <18)
Troponin I (High Sensitivity): <2 ng/L (ref <18)

## 2020-02-11 LAB — BASIC METABOLIC PANEL
Anion gap: 10 (ref 5–15)
BUN: 16 mg/dL (ref 6–20)
CO2: 24 mmol/L (ref 22–32)
Calcium: 10 mg/dL (ref 8.9–10.3)
Chloride: 108 mmol/L (ref 98–111)
Creatinine, Ser: 1 mg/dL (ref 0.44–1.00)
GFR calc Af Amer: >60 mL/min (ref >60)
GFR calc non Af Amer: >60 mL/min (ref >60)
Glucose, Bld: 106 mg/dL — ABNORMAL HIGH (ref 70–99)
Potassium: 3.6 mmol/L (ref 3.5–5.1)
Sodium: 142 mmol/L (ref 135–145)

## 2020-02-11 LAB — LIPASE, BLOOD: Lipase: 21 U/L (ref 11–51)

## 2020-02-11 LAB — BRAIN NATRIURETIC PEPTIDE: B Natriuretic Peptide: 68 pg/mL (ref 0.0–100.0)

## 2020-02-11 LAB — D-DIMER, QUANTITATIVE: D-Dimer, Quant: <0.27 ug/mL-FEU (ref 0.00–0.50)

## 2020-02-11 MED ORDER — SODIUM CHLORIDE 0.9% FLUSH: 3.0000 mL | Freq: Once | INTRAVENOUS | Status: DC

## 2020-02-11 MED ORDER — LIDOCAINE VISCOUS HCL 2 % MT SOLN
15.0000 mL | Freq: Once | OROMUCOSAL | Status: AC
  Administered 2020-02-11: 15 mL via ORAL
  Filled 2020-02-11: qty 15

## 2020-02-11 MED ORDER — ALUM & MAG HYDROXIDE-SIMETH 200-200-20 MG/5ML PO SUSP
30.0000 mL | Freq: Once | ORAL | Status: AC
  Administered 2020-02-11: 30 mL via ORAL
  Filled 2020-02-11: qty 30

## 2020-02-11 NOTE — ED Triage Notes (Signed)
Patient complains of shortness of breath and chest pain that began this morning. Patient states it feels like there is an elephant sitting on her chest. The pain radiates to her back.

## 2020-02-12 MED ORDER — PANTOPRAZOLE SODIUM 20 MG PO TBEC: 20.0000 mg | DELAYED_RELEASE_TABLET | Freq: Every day | ORAL | 0 refills | Status: DC

## 2020-02-12 NOTE — ED Provider Notes (Signed)
[facility] EMERGENCY DEPARTMENT Provider Note   CSN: [id number] Arrival date & time: 02/11/20  1558     History Chief Complaint  Patient presents with  . Chest Pain    [name] is a 59 y.o. female with PMHx HTN, depression, bipolar disorder, and anxiety who presents to the ED today complaining of sudden onset, intermittent, substernal chest pain/epigastric pain that began earlier today.  Reports the pain lasts a couple minutes before it goes away.  States she checked her blood pressure today and it was elevated [id number] systolic over [id number] diastolic.  She states that she continued to take her blood pressure and it kept increasing to about [id number] diastolic which concerned her.  She is also complaining of shortness of breath however states that she has had dyspnea on exertion for approximately 1 year.  She had seen a cardiologist in [place] however reports that no tests were done.  States she feels like her lips were turning blue today.  On arrival to the ED patient is satting 100% on room air.  90 recent COVID-19 positive exposure.  Denies cough.  History of DVT/PE.  No recent prolonged travel or immobilization.  No hemoptysis.  No exogenous hormone use.  No active malignancy.  No other complaints at this time.  Patient is a non-smoker.   The history is provided by the patient and medical records.       Past Medical History:  Diagnosis Date  . Anxiety   . Bipolar disorder ([place])   . Brain tumor (benign) ([place])   . Depression   . HTN (hypertension)   . Mania ([place])   . Meningioma ([place])   . Migraine     Patient Active Problem List   Diagnosis Date Noted  . DOE (dyspnea on exertion) 08/04/2019  . Chronic migraine 11/06/2016  . Hx of resection of meningioma 10/07/2016  . Bipolar 1 disorder, depressed, moderate ([place]) 11/14/2012    Class: Chronic  . 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 ABDOMINAL HYSTERECTOMY    . BRAIN SURGERY    . breast tumor removal    . INCONTINENCE SURGERY    . INTERSTIM IMPLANT PLACEMENT    . KNEE SURGERY     right x 2  . PLANTAR FASCIECTOMY Left 03/05/16  . TUBAL LIGATION       OB History    Gravida  2   Para  2   Term  2   Preterm      AB      Living  2     SAB      TAB      Ectopic      Multiple      Live Births              Family History  Problem Relation Age of Onset  . Alcohol abuse Father   . Diabetes Father   . Drug abuse Brother   . Mental illness Mother   . Breast cancer Mother   . Dementia Neg Hx   . ADD / ADHD Neg Hx   . Anxiety disorder Neg Hx   . Bipolar disorder Neg Hx   . Depression Neg Hx   . OCD Neg Hx   . Paranoid behavior Neg Hx   . Schizophrenia Neg Hx   . Seizures Neg Hx   .  Sexual abuse Neg Hx   . Physical abuse Neg Hx   . Suicidality Neg Hx     Social History   Tobacco Use  . Smoking status: Never Smoker  . Smokeless tobacco: Never Used  Substance Use Topics  . Alcohol use: No    Alcohol/week: 0.0 standard drinks  . Drug use: No    Home Medications Prior to Admission medications   Medication Sig Start Date End Date Taking? Authorizing Provider  aspirin-acetaminophen-caffeine (EXCEDRIN MIGRAINE) [phone] MG tablet Take 2 tablets by mouth every 6 (six) hours as needed for migraine.    [provider]  buPROPion (WELLBUTRIN XL) 300 MG 24 hr tablet TAKE ONE TABLET BY MOUTH EVERY MORINING FOR DEPRESSION Patient taking differently: Take 300 mg by mouth daily. TAKE ONE TABLET BY MOUTH EVERY MORINING FOR DEPRESSION 01/24/20   Arfeen, Arlyce Harman, MD  clonazePAM (KLONOPIN) 1 MG tablet Take 1 tablet (1 mg total) by mouth at bedtime. 01/24/20   Arfeen, Arlyce Harman, MD  HYDROcodone-acetaminophen (NORCO/VICODIN) 5-325 MG tablet Take 1 tablet by mouth every 4 (four) hours as needed. 10/09/18   Evalee Jefferson, PA-C  hydrOXYzine (ATARAX/VISTARIL) 10 MG tablet  Take 1 tablet (10 mg total) by mouth at bedtime. 01/24/20   Arfeen, Arlyce Harman, MD  ibuprofen (ADVIL,MOTRIN) 800 MG tablet Take 1 tablet (800 mg total) by mouth 3 (three) times daily. 12/16/17   Noemi Chapel, MD  lithium 600 MG capsule Take 1 capsule (600 mg total) by mouth 3 (three) times daily with meals. 01/24/20   Arfeen, Arlyce Harman, MD  metoprolol succinate (TOPROL-XL) 25 MG 24 hr tablet Take 12.5 mg by mouth daily.  07/22/16   [provider]  pantoprazole (PROTONIX) 20 MG tablet Take 1 tablet (20 mg total) by mouth daily. 02/12/20 03/13/20  Alroy Bailiff, Ashea Winiarski, PA-C  risperiDONE (RISPERDAL) 2 MG tablet TAKE ONE TABLET BY MOUTH AT BEDTIME AS NEEDED FOR MOOD CONTROL Patient taking differently: Take 2 mg by mouth at bedtime as needed. FOR MOOD CONTROL 01/24/20   Arfeen, Arlyce Harman, MD  topiramate (TOPAMAX) 50 MG tablet Take 1 tablet (50 mg total) by mouth daily. 01/24/20   [name], MD    Allergies    Gabapentin, Oxcarbazepine, Tegretol [carbamazepine], Codeine, Divalproex sodium, Erythromycin, Phenytoin sodium extended, and Phenytoin sodium extended  Review of Systems   Review of Systems  Constitutional: Negative for chills and fever.  Respiratory: Positive for shortness of breath. Negative for cough.   Cardiovascular: Positive for chest pain.  Gastrointestinal: Positive for nausea. Negative for abdominal pain, diarrhea and vomiting.  All other systems reviewed and are negative.   Physical Exam Updated Vital Signs BP (!) 150/113 (BP Location: Right Arm)   Pulse 87   Temp 99.1 F (37.3 C) (Oral)   Resp 16   Ht 5\' 4"  (1.626 m)   Wt 86.2 kg   SpO2 97%   BMI 32.61 kg/m   Physical Exam Vitals and nursing note reviewed.  Constitutional:      Appearance: She is not ill-appearing or diaphoretic.  HENT:     Head: Normocephalic and atraumatic.  Eyes:     Conjunctiva/sclera: Conjunctivae normal.  Cardiovascular:     Rate and Rhythm: Normal rate and regular rhythm.     Pulses:           Radial pulses are 2+ on the right side and 2+ on the left side.       Dorsalis pedis pulses are 2+ on the right side  and 2+ on the left side.  Pulmonary:     Effort: Pulmonary effort is normal.     Breath sounds: Normal breath sounds. No decreased breath sounds, wheezing, rhonchi or rales.  Chest:     Chest wall: No tenderness.  Abdominal:     Palpations: Abdomen is soft.     Tenderness: There is abdominal tenderness. There is no guarding or rebound.     Comments: Soft, + epigastric TTP, +BS throughout, no r/g/r, neg murphy's, neg mcburney's, no CVA TTP  Musculoskeletal:     Cervical back: Neck supple.     Right lower leg: No tenderness. No edema.     Left lower leg: No tenderness. No edema.  Skin:    General: Skin is warm and dry.  Neurological:     Mental Status: She is alert.     ED Results / Procedures / Treatments   Labs (all labs ordered are listed, but only abnormal results are displayed) Labs Reviewed  BASIC METABOLIC PANEL - Abnormal; Notable for the following components:      Result Value   Glucose, Bld 106 (*)    All other components within normal limits  CBC - Abnormal; Notable for the following components:   WBC 12.0 (*)    Hemoglobin 15.1 (*)    HCT 46.7 (*)    All other components within normal limits  HEPATIC FUNCTION PANEL - Abnormal; Notable for the following components:   AST 14 (*)    All other components within normal limits  NOVEL CORONAVIRUS, NAA (HOSP ORDER, SEND-OUT TO REF LAB; TAT 18-24 HRS)  D-DIMER, QUANTITATIVE (NOT AT [facility])  BRAIN NATRIURETIC PEPTIDE  LIPASE, BLOOD  TROPONIN I (HIGH SENSITIVITY)  TROPONIN I (HIGH SENSITIVITY)    EKG None  Radiology DG Chest 2 View  Result Date: 02/11/2020 CLINICAL DATA:  59 year old female with chest pain and shortness of breath onset this morning. EXAM: CHEST - 2 VIEW COMPARISON:  Chest radiographs 08/04/2019 and earlier. FINDINGS: PA and lateral views. Lung volumes and mediastinal contours remain  normal. Visualized tracheal air column is within normal limits. The lungs appear stable and clear. No pneumothorax or pleural effusion. Negative visible bowel gas pattern. No acute osseous abnormality identified. IMPRESSION: Negative.  No acute cardiopulmonary abnormality. Electronically Signed   By: Genevie Ann M.D.   On: 02/11/2020 18:19    Procedures Procedures (including critical care time)  Medications Ordered in ED Medications  sodium chloride flush (NS) 0.9 % injection 3 mL (3 mLs Intravenous Not Given 02/11/20 2053)  alum & mag hydroxide-simeth (MAALOX/MYLANTA) 200-200-20 MG/5ML suspension 30 mL (30 mLs Oral Given 02/11/20 2359)    And  lidocaine (XYLOCAINE) 2 % viscous mouth solution 15 mL (15 mLs Oral Given 02/11/20 2359)    ED Course  I have reviewed the triage vital signs and the nursing notes.  Pertinent labs & imaging results that were available during my care of the patient were reviewed by me and considered in my medical decision making (see chart for details).  Clinical Course as of Feb 12 48  Fri Feb 11, 2020  2115 D-Dimer, Quant: <0.27 [MV]    Clinical Course User Index [MV] Eustaquio Maize, PA-C   59 year old female who presents the ED today complaining of substernal chest pain that began earlier today and is intermittent in nature.  Also complaining of shortness of breath.  States that her lips were turning blue earlier today and her blood pressure was elevated.  Patient does have a  history of anxiety.  On arrival to the ED patient's temp 99.1.  Nontachycardic and nontachypneic.  Satting 100% on room air.  There is no accessory muscle use.  Patient able to speak in full sentences without difficulty.  Lungs clear to auscultation bilaterally.  He has no chest tenderness on exam, her tenderness is specifically more in the epigastric region however patient thinks that this is her chest.   Lab work was obtained prior to being seen. CBC mildly elevated at 12,000. Hgb stable.   BMP with glucose 106; no other acute findings.  Initial troponin of < 2. CXR negative   Given pt's complaints of SOB and age I am unable to [facility] out - will add on d dimer  As well as LFTs, lipase, and BNP.   Repeat troponin < 2.  LFTs within normal limits Lipase 21 BNP 68  Pt given GI cocktail with mild improvement in her symptoms however she still states she feels SOB. Will swab for covid at this time with send out test. Pt advised to stay at home and self isolate until she receives her results. Will prescribe protonix as I suspect for acid reflux type of pain vs cardiac etiology.   Pt advised to follow up with her PCP for further evaluation. Strict return precautions have been discussed. Pt is in agreement with plan and stable for discharge home.   This note was prepared using Dragon voice recognition software and may include unintentional dictation errors due to the inherent limitations of voice recognition software.  [name] was evaluated in Emergency Department on 02/12/2020 for the symptoms described in the history of present illness. She was evaluated in the context of the global COVID-19 pandemic, which necessitated consideration that the patient might be at risk for infection with the SARS-CoV-2 virus that causes COVID-19. Institutional protocols and algorithms that pertain to the evaluation of patients at risk for COVID-19 are in a state of rapid change based on information released by regulatory bodies including the CDC and federal and state organizations. These policies and algorithms were followed during the patient's care in the ED.  MDM Rules/Calculators/A&P                       Final Clinical Impression(s) / ED Diagnoses Final diagnoses:  Epigastric pain  Nonspecific chest pain  Shortness of breath    Rx / DC Orders ED Discharge Orders         Ordered    pantoprazole (PROTONIX) 20 MG tablet  Daily     02/12/20 0026           Discharge Instructions      Your labwork and chest  xray were very reassuring today Your symptoms may be related to acid reflux - I have prescribed medication for you to take that helps reduce the acid in your stomach  We have also tested you for COVID 19 given complaints of shortness of breath - please stay at home and self isolate until you receive your results  If positive we will call you - you will need to stay home for 14 days starting today (cleared: 02/27/2020). If negative you will not receive a call - please log into mychart to check your results in 48-72 hours  Please follow up with your PCP regarding your ED visit today  Return to the ED for any worsening symptoms including worsening pain, vomiting blood, fevers > 100.4, blood in stool, worsening shortness of breath,  leg swelling, or any other concerning symptoms       Eustaquio Maize, PA-C 02/12/20 0050    Nat Christen, MD 02/12/20 [phone]

## 2020-02-12 NOTE — Discharge Instructions (Addendum)
Your labwork and chest  xray were very reassuring today Your symptoms may be related to acid reflux - I have prescribed medication for you to take that helps reduce the acid in your stomach  We have also tested you for COVID 19 given complaints of shortness of breath - please stay at home and self isolate until you receive your results  If positive we will call you - you will need to stay home for 14 days starting today (cleared: 02/27/2020). If negative you will not receive a call - please log into mychart to check your results in 48-72 hours  Please follow up with your PCP regarding your ED visit today  Return to the ED for any worsening symptoms including worsening pain, vomiting blood, fevers > 100.4, blood in stool, worsening shortness of breath, leg swelling, or any other concerning symptoms

## 2020-02-13 LAB — NOVEL CORONAVIRUS, NAA (HOSP ORDER, SEND-OUT TO REF LAB; TAT 18-24 HRS): SARS-CoV-2, NAA: NOT DETECTED

## 2020-03-15 ENCOUNTER — Telehealth (HOSPITAL_COMMUNITY): Payer: Self-pay | Admitting: *Deleted

## 2020-03-15 NOTE — Telephone Encounter (Signed)
Writer spoke with "Les" PhD with [facility] mail order pharmacy regarding pt question about negative interaction between Lithium and blood pressure medications she may be started on. PhD ran all the interactions and warnings and found nothing in the "major" category. Under the "moderate" category Lithium and Losartan, says to monitor Lithium levels and adjust accordingly. More of an interaction with Risperdal/Wellbutrin as in prolongation of QT interval. Writer to inform pt.

## 2020-03-15 NOTE — Telephone Encounter (Signed)
Please contact pharmacy and get more details about what medicine causing interaction with lithium.  I agree that she is on lithium for many years and we have tried other medication but did not work for her.

## 2020-03-15 NOTE — Telephone Encounter (Signed)
Thank you for the update information.  We will continue to monitor her lithium level closely.  Please have her last lithium level results get from her primary care physician and placed in the chart.  So look like her current antihypertensive medication does not cause a major interaction with lithium however if she will start a new medication to control blood pressure then her primary care physician to look into drug drug interaction with lithium.  She is on lithium for many years and we have tried other medication but she could not tolerate and having side effects.

## 2020-03-15 NOTE — Telephone Encounter (Signed)
Pt has been advised by pharmacist that there is a black box warning for the bp meds she has started combined with the Lithium. Pt states that her pressure has not come down with current meds so PCP wanting to change to something stronger. Can she replace Lithium with some other mood stabilizer. I realize she's been on Lithium for many years. Please review and advise.

## 2020-03-16 NOTE — Telephone Encounter (Signed)
Patient called and stated that her PCP put her on 2 blood pressure medications but she stated that the medications are not bringing her blood pressure down. They want to put her on Lofartan but stated she can't take it with the Lithium. She stated that she will take anything at this point just to get her bp down. Please review and advise. Thank you.

## 2020-03-16 NOTE — Telephone Encounter (Signed)
She can take losartan with lithium.  We have to monitor her kidney function test and lithium level closely.  There is no contraindication that she cannot take the lithium blood loss started.

## 2020-03-30 ENCOUNTER — Ambulatory Visit (INDEPENDENT_AMBULATORY_CARE_PROVIDER_SITE_OTHER): Payer: Medicare PPO | Admitting: Psychiatry

## 2020-03-30 ENCOUNTER — Other Ambulatory Visit: Payer: Self-pay

## 2020-03-30 ENCOUNTER — Encounter (HOSPITAL_COMMUNITY): Payer: Self-pay | Admitting: Psychiatry

## 2020-03-30 DIAGNOSIS — F319 Bipolar disorder, unspecified: Secondary | ICD-10-CM | POA: Diagnosis not present

## 2020-03-30 DIAGNOSIS — F411 Generalized anxiety disorder: Secondary | ICD-10-CM

## 2020-03-30 DIAGNOSIS — G3184 Mild cognitive impairment, so stated: Secondary | ICD-10-CM | POA: Diagnosis not present

## 2020-03-30 MED ORDER — CLONAZEPAM 1 MG PO TABS: 1.0000 mg | ORAL_TABLET | Freq: Every day | ORAL | 2 refills | Status: DC

## 2020-03-30 MED ORDER — HYDROXYZINE HCL 10 MG PO TABS: 10.0000 mg | ORAL_TABLET | Freq: Every day | ORAL | 0 refills | Status: DC

## 2020-03-30 MED ORDER — TOPIRAMATE 50 MG PO TABS: 50.0000 mg | ORAL_TABLET | Freq: Every day | ORAL | 0 refills | Status: DC

## 2020-03-30 MED ORDER — RISPERIDONE 2 MG PO TABS: 2.0000 mg | ORAL_TABLET | Freq: Every day | ORAL | 0 refills | Status: DC

## 2020-03-30 MED ORDER — BUPROPION HCL ER (XL) 300 MG PO TB24: 300.0000 mg | ORAL_TABLET | Freq: Every day | ORAL | 0 refills | Status: DC

## 2020-03-30 NOTE — Progress Notes (Signed)
Virtual Visit via Telephone Note  I connected with [name] on 03/30/20 at  3:40 PM EDT by telephone and verified that I am speaking with the correct person using two identifiers.   I discussed the limitations, risks, security and privacy concerns of performing an evaluation and management service by telephone and the availability of in person appointments. I also discussed with the patient that there may be a patient responsible charge related to this service. The patient expressed understanding and agreed to proceed.   History of Present Illness: Patient was evaluated by phone session.  She is under a lot of stress because recently she had very high blood pressure.  She has a chest pain and she was seen in the emergency room.  Her primary care physician Dr. Griffin Basil is trying to adjust her blood pressure medication.  Initially they want to try losartan but due to concern that patient is lithium they did not.  Now she is taking amlodipine 2.5 mg and metoprolol 12.5 mg.  Her blood pressure is somewhat better but still she is nervous that is not fully controlled.  We talked about trying a different medication other than lithium but patient has been on lithium for many years and so far did better as compared to other medication.  She is allergic to Tegretol and seizure medicine.  We also tried to cut down her medication but her symptoms relapse.  So far she feels the current medicine is working but due to recent high blood pressure she is anxious.  Her sleep is okay.  She is afraid to drive because she does not want to have again chest pain.  She lives with her husband who is very supportive.  She denies suicidal thoughts.  She has mild cognitive impairment.  Past Psychiatric History:Reviewed. H/O bipolar disorder and multiple inpatient treatment. Last inpatient inSeptember 2014 for suicidal thinking,hallucinationasplaying with guns and superficially cut her wrist with a needle. H/O mania and  psychosis.Tried Zyprexa Seroquelbut stopped due toweight gain. Tegretol causedincreased paranoia confusion and required inpatient.   Recent Results (from the past 2160 hour(s))  Basic metabolic panel     Status: Abnormal   Collection Time: 02/11/20  5:31 PM  Result Value Ref Range   Sodium 142 135 - 145 mmol/L   Potassium 3.6 3.5 - 5.1 mmol/L   Chloride 108 98 - 111 mmol/L   CO2 24 22 - 32 mmol/L   Glucose, Bld 106 (H) 70 - 99 mg/dL   BUN 16 6 - 20 mg/dL   Creatinine, Ser 1.00 0.44 - 1.00 mg/dL   Calcium 10.0 8.9 - 10.3 mg/dL   GFR calc non Af Amer >60 >60 mL/min   GFR calc Af Amer >60 >60 mL/min   Anion gap 10 5 - 15    Comment: Performed at [facility], [address], [place], [place] [address]  CBC     Status: Abnormal   Collection Time: 02/11/20  5:31 PM  Result Value Ref Range   WBC 12.0 (H) 4.0 - 10.5 K/uL   RBC 4.95 3.87 - 5.11 MIL/uL   Hemoglobin 15.1 (H) 12.0 - 15.0 g/dL   HCT 46.7 (H) 36.0 - 46.0 %   MCV 94.3 80.0 - 100.0 fL   MCH 30.5 26.0 - 34.0 pg   MCHC 32.3 30.0 - 36.0 g/dL   RDW 12.4 11.5 - 15.5 %   Platelets 293 150 - 400 K/uL   nRBC 0.0 0.0 - 0.2 %    Comment:  Performed at [facility], [address] [place], [place] [address]  Troponin I (High Sensitivity)     Status: None   Collection Time: 02/11/20  5:31 PM  Result Value Ref Range   Troponin I (High Sensitivity) <2.0 <18 ng/L    Comment: Performed at [facility], [address] [place], [place] [address]  D-dimer, quantitative     Status: None   Collection Time: 02/11/20  5:31 PM  Result Value Ref Range   D-Dimer, Quant <0.27 0.00 - 0.50 ug/mL-FEU    Comment: (NOTE) At the manufacturer cut-off of 0.50 ug/mL FEU, this assay has been documented to exclude PE with a sensitivity and negative predictive value of 97 to 99%.  At this time, this assay has not been approved by the FDA to exclude DVT/VTE. Results should be correlated with clinical presentation. Performed at [facility], [address] [place], [place] [address]   Lipase, blood     Status: None   Collection Time: 02/11/20  5:31 PM  Result Value Ref Range   Lipase 21 11 - 51 U/L    Comment: Performed at [facility], [address] [place], [place] [address]  Troponin I (High Sensitivity)     Status: None   Collection Time: 02/11/20  8:35 PM  Result Value Ref Range   Troponin I (High Sensitivity) <2 <18 ng/L    Comment: Performed at [facility], [address], [place], [place] [address]  Hepatic function panel     Status: Abnormal   Collection Time: 02/11/20  8:35 PM  Result Value Ref Range   Total Protein 7.5 6.5 - 8.1 g/dL   Albumin 4.3 3.5 - 5.0 g/dL   AST 14 (L) 15 - 41 U/L   ALT 15 0 - 44 U/L   Alkaline Phosphatase 87 38 - 126 U/L   Total Bilirubin 0.6 0.3 - 1.2 mg/dL   Bilirubin, Direct 0.1 0.0 - 0.2 mg/dL   Indirect Bilirubin 0.5 0.3 - 0.9 mg/dL    Comment: Performed at [facility], [address] [place], [place] [address]  Brain natriuretic peptide     Status: None   Collection Time: 02/11/20  8:35 PM  Result Value Ref Range   B Natriuretic Peptide 68.0 0.0 - 100.0 pg/mL    Comment: Performed at [facility], [address] [place], [place] [address]  Novel Coronavirus, NAA (Hosp order, Send-out to Ref Lab; TAT 18-24 hrs     Status: None   Collection Time: 02/12/20 12:26 AM   Specimen: Nasopharyngeal Swab; Respiratory  Result Value Ref Range   SARS-CoV-2, NAA NOT DETECTED NOT DETECTED    Comment: (NOTE) This nucleic acid amplification test was developed and its performance characteristics determined by [employer]. Nucleic acid amplification tests include RT-PCR and TMA. This test has not been FDA cleared or approved. This test has been authorized by FDA under an Emergency Use Authorization (EUA). This test is only authorized for the duration of time the declaration that circumstances exist justifying the authorization of the emergency use of in vitro diagnostic tests for detection  of SARS-CoV-2 virus and/or diagnosis of COVID-19 infection under section 564(b)(1) of the Act, 21 U.S.C. [id number]) (1), unless the authorization is terminated or revoked sooner. When diagnostic testing is negative, the possibility of a false negative result should be considered in the context of a patient's recent exposures and the presence of clinical signs and symptoms consistent with COVID-19. An individual without symptoms of COVID- 19 and  who is not shedding SARS-CoV-2  virus would expect to have a negative (not detected) result in this assay. Performed At: [facility] [address] [place], [place] [id number] Rush Farmer MD [id number]    Coronavirus Source NASOPHARYNGEAL     Comment: Performed at [facility], [address] [place],  [address]     Psychiatric Specialty Exam: Physical Exam  Review of Systems  There were no vitals taken for this visit.There is no height or weight on file to calculate BMI.  General Appearance: NA  Eye Contact:  NA  Speech:  Clear and Coherent and Slow  Volume:  Decreased  Mood:  Anxious  Affect:  NA  Thought Process:  Goal Directed  Orientation:  Full (Time, Place, and Person)  Thought Content:  Rumination  Suicidal Thoughts:  No  Homicidal Thoughts:  No  Memory:  Immediate;   Fair Recent;   Fair Remote;   Fair  Judgement:  Intact  Insight:  Present  Psychomotor Activity:  NA  Concentration:  Concentration: Fair and Attention Span: Fair  Recall:  [employer] of Knowledge:  Fair  Language:  Good  Akathisia:  No  Handed:  Right  AIMS (if indicated):     Assets:  Communication Skills Desire for Improvement Housing Resilience Social Support  ADL's:  Intact  Cognition:  WNL  Sleep:   fair      Assessment and Plan: Bipolar disorder type I.  Generalized anxiety disorder.  Mild cognitive impairment.  I have a long discussion with the patient about lithium and other medication interaction.  We explained  that she did better on lithium and if her physician wants to talk to me then I am happy to discuss other choices.  She provide her PCP phone number which is [phone] and I tried to call her PCP Dr. Griffin Basil but phone did not go through.  I explained that she has other choices to change her blood pressure medication since she does better on lithium I prefer that she should stay on lithium.  However if she insists then we will try different mood stabilizer.  Unfortunately she is allergic to other seizure medicine.  She will continue Risperdal 2 mg at bedtime, Klonopin 1 mg at bedtime, Topamax 50 mg at bedtime, hydroxyzine 10 mg every night.  We will send all these prescription to her [place] for 90 days.  She has lithium remaining and does not want a new prescription at this time.  I also suggest that she should contact her PCP and provide our phone number if they have any question.  She agreed to give them a call.  Follow-up in 3 months.    Follow Up Instructions:    I discussed the assessment and treatment plan with the patient. The patient was provided an opportunity to ask questions and all were answered. The patient agreed with the plan and demonstrated an understanding of the instructions.   The patient was advised to call back or seek an in-person evaluation if the symptoms worsen or if the condition fails to improve as anticipated.  I provided 20 minutes of non-face-to-face time during this encounter.   [name], MD

## 2020-04-24 ENCOUNTER — Ambulatory Visit (HOSPITAL_COMMUNITY): Payer: Medicare PPO | Admitting: Psychiatry

## 2020-06-19 ENCOUNTER — Telehealth (HOSPITAL_COMMUNITY): Payer: Self-pay

## 2020-06-19 ENCOUNTER — Other Ambulatory Visit (HOSPITAL_COMMUNITY): Payer: Self-pay

## 2020-06-19 NOTE — Telephone Encounter (Signed)
Patient called and stated that her tremors are getting worse and she wants to know what she can do. Please review and advise. Thank you.

## 2020-06-20 ENCOUNTER — Other Ambulatory Visit (HOSPITAL_COMMUNITY): Payer: Self-pay

## 2020-06-20 MED ORDER — BENZTROPINE MESYLATE 0.5 MG PO TABS: ORAL_TABLET | ORAL | 0 refills | Status: DC

## 2020-06-20 NOTE — Telephone Encounter (Signed)
She can try Cogentin if she had never tried before.  If she agree please call benztropine 0.5 mg at bedtime.

## 2020-06-20 NOTE — Telephone Encounter (Signed)
Sent in Benztropine 0.5mg  qhs per doctor and notified patient

## 2020-06-29 ENCOUNTER — Other Ambulatory Visit: Payer: Self-pay

## 2020-06-29 ENCOUNTER — Other Ambulatory Visit (HOSPITAL_COMMUNITY): Payer: Self-pay | Admitting: *Deleted

## 2020-06-29 ENCOUNTER — Telehealth (INDEPENDENT_AMBULATORY_CARE_PROVIDER_SITE_OTHER): Payer: Medicare PPO | Admitting: Psychiatry

## 2020-06-29 ENCOUNTER — Encounter (HOSPITAL_COMMUNITY): Payer: Self-pay | Admitting: Psychiatry

## 2020-06-29 DIAGNOSIS — F319 Bipolar disorder, unspecified: Secondary | ICD-10-CM | POA: Diagnosis not present

## 2020-06-29 DIAGNOSIS — F411 Generalized anxiety disorder: Secondary | ICD-10-CM | POA: Diagnosis not present

## 2020-06-29 DIAGNOSIS — F09 Unspecified mental disorder due to known physiological condition: Secondary | ICD-10-CM

## 2020-06-29 DIAGNOSIS — Z79899 Other long term (current) drug therapy: Secondary | ICD-10-CM

## 2020-06-29 DIAGNOSIS — G3184 Mild cognitive impairment, so stated: Secondary | ICD-10-CM | POA: Diagnosis not present

## 2020-06-29 MED ORDER — BUPROPION HCL ER (XL) 300 MG PO TB24: 300.0000 mg | ORAL_TABLET | Freq: Every day | ORAL | 0 refills | Status: DC

## 2020-06-29 MED ORDER — HYDROXYZINE HCL 10 MG PO TABS: 10.0000 mg | ORAL_TABLET | Freq: Every day | ORAL | 0 refills | Status: DC

## 2020-06-29 MED ORDER — CLONAZEPAM 1 MG PO TABS: 1.0000 mg | ORAL_TABLET | Freq: Every day | ORAL | 2 refills | Status: DC

## 2020-06-29 MED ORDER — RISPERIDONE 2 MG PO TABS: 2.0000 mg | ORAL_TABLET | Freq: Every day | ORAL | 0 refills | Status: DC

## 2020-06-29 MED ORDER — LITHIUM CARBONATE 600 MG PO CAPS: 600.0000 mg | ORAL_CAPSULE | Freq: Two times a day (BID) | ORAL | 0 refills | Status: DC

## 2020-06-29 NOTE — Progress Notes (Signed)
Virtual Visit via Telephone Note  I connected with Toy Baker on 06/29/20 at  2:00 PM EDT by telephone and verified that I am speaking with the correct person using two identifiers.  Location: Patient: home Provider: home office   I discussed the limitations, risks, security and privacy concerns of performing an evaluation and management service by telephone and the availability of in person appointments. I also discussed with the patient that there may be a patient responsible charge related to this service. The patient expressed understanding and agreed to proceed.   History of Present Illness: Patient is evaluated by phone session.  She still struggles with blood pressure but overall she is feeling better.  She accidentally lost Topamax bottle but realized since then she is sleeping better and her memory is not as bad.  She does not want to go back to Topamax.  She is sleeping better.  Recently we did start it benztropine to help the tremors but she do not see any improvement.  She is busy seeing cardiologist and PCP trying to had a better control on her blood pressure.  She is taking hydroxyzine to help with anxiety.  She is taking lithium twice a day even though it is written 3 times a day.  Patient reported that she is taking 2 a day for a long time and do not see any worsening of the symptoms.  Her last lithium level was therapeutic.  We will do lithium level before her next appointment.  She denies any paranoia, hallucination, anger, suicidal thoughts.  She had a good support from her husband who lives with her.   Past Psychiatric History:Reviewed. H/Obipolar disorderandmultiple inpatient treatment. Last inpatient inSeptember 2014 for suicidal thinking,hallucinationasplaying with guns and superficially cut her wrist with a needle. H/Omania and psychosis.Tried Zyprexa Seroquelbut stopped due toweight gain. Tegretol causedincreased paranoia confusion and required  inpatient.    Psychiatric Specialty Exam: Physical Exam  Review of Systems  There were no vitals taken for this visit.There is no height or weight on file to calculate BMI.  General Appearance: NA  Eye Contact:  NA  Speech:  Clear and Coherent and Slow  Volume:  Normal  Mood:  Dysphoric  Affect:  NA  Thought Process:  Goal Directed  Orientation:  Full (Time, Place, and Person)  Thought Content:  Rumination  Suicidal Thoughts:  No  Homicidal Thoughts:  No  Memory:  Immediate;   Good Recent;   Fair Remote;   Fair  Judgement:  Intact  Insight:  Present  Psychomotor Activity:  Tremor  Concentration:  Concentration: Fair and Attention Span: Fair  Recall:  [employer] of Knowledge:  Good  Language:  Good  Akathisia:  No  Handed:  Right  AIMS (if indicated):     Assets:  Communication Skills Desire for Improvement Housing Resilience Social Support  ADL's:  Intact  Cognition:  WNL  Sleep:   good      Assessment and Plan: Bipolar disorder type I.  Generalized anxiety disorder.  Mild cognitive impairment.  I reviewed her medication.  She is no longer taking Topamax and I recommend to discontinue Cogentin since it did not help the tremors.  I recommend to see neurology for tremors but patient at this time busy seeing her PCP and cardiologist to stabilize her blood pressure.  We will do lithium level.  She is taking 600 mg twice a day for a long time even though should be taken 3 times a day.  She does not feel worsening of the symptoms so we will keep the current dose of lithium.  I would also continue Resporal 2 mg at bedtime, Klonopin 1 mg at bedtime, hydroxyzine 10 mg every night, lithium 600 mg twice a day and Wellbutrin XL 300 mg daily.  Recommended to call us back if she is any question or any concern.  Follow-up in 3 months.  If her tremors continue to get worse then she promised to see neurology.    Follow Up Instructions:    I discussed the assessment and treatment  plan with the patient. The patient was provided an opportunity to ask questions and all were answered. The patient agreed with the plan and demonstrated an understanding of the instructions.   The patient was advised to call back or seek an in-person evaluation if the symptoms worsen or if the condition fails to improve as anticipated.  I provided 15 minutes of non-face-to-face time during this encounter.   [name], MD

## 2020-07-05 ENCOUNTER — Telehealth (HOSPITAL_COMMUNITY): Payer: Self-pay | Admitting: *Deleted

## 2020-07-05 LAB — LITHIUM LEVEL: Lithium Lvl: 1.5 mmol/L — ABNORMAL HIGH (ref 0.6–1.2)

## 2020-07-05 NOTE — Telephone Encounter (Signed)
Received results from [facility]; lithium level high @ 1.5 mmol/L.

## 2020-07-05 NOTE — Telephone Encounter (Deleted)
Pt Lithium level slightly high @ ! 5 [address] Progression Recent Vital Signs   @VS @   Past Medical History:  Diagnosis Date  . Anxiety   . Bipolar disorder ([place])   . Brain tumor (benign) ([place])   . Depression   . HTN (hypertension)   . Mania ([place])   . Meningioma ([place])   . Migraine      Expected Discharge Date  @FLOW (160737::1)@  Diet Order    None       VTE Documentation  @FLOW (0626948::5)@   Work Intensity Score/Level of Care  @FLOW (10536::1)@  @LEVELOFCARE @   Mobility  @FLOW (7060220::1)@     Significant Events    DC Barriers   Abnormal Labs:  Alison Murray 07/05/2020, 10:31 AM mmol/L

## 2020-07-05 NOTE — Telephone Encounter (Signed)
Unless she took lithium dose in AM before blood draw (in which case level is meaningless and should be repeated at least 12 hours after last dose) she should stop taking AM dose and recheck level in one week.

## 2020-07-05 NOTE — Telephone Encounter (Signed)
Despite being previously educated on not taking Lithium prior to lab draw, pt says she's always done it that way and was so on last draw on 07/04/20. This nurse will resend lab orders and pt has been instructed to have them drawn next week holding dose of Lithium in the am.m before draw. Pt agrees she will.

## 2020-07-05 NOTE — Telephone Encounter (Signed)
She does not need to wait a week to have it redone (I suggested to do it in a week in case we needed to lower the dose because of high blood level but it does not appear to be needed as yet).

## 2020-07-10 ENCOUNTER — Other Ambulatory Visit (HOSPITAL_COMMUNITY): Payer: Self-pay | Admitting: Psychiatry

## 2020-07-10 ENCOUNTER — Telehealth (HOSPITAL_COMMUNITY): Payer: Self-pay | Admitting: *Deleted

## 2020-07-10 NOTE — Telephone Encounter (Signed)
OK - we'll wait and see.

## 2020-07-10 NOTE — Telephone Encounter (Signed)
Pt had her labs drawn this morning. No results yet.

## 2020-07-11 ENCOUNTER — Telehealth (HOSPITAL_COMMUNITY): Payer: Self-pay | Admitting: *Deleted

## 2020-07-11 LAB — LITHIUM LEVEL: Lithium Lvl: 0.9 mmol/L (ref 0.6–1.2)

## 2020-07-11 NOTE — Telephone Encounter (Signed)
Lithium level received from [facility] as collected on 07/10/20. Fasting level is WNL at 0.9 mmol/L.

## 2020-07-11 NOTE — Telephone Encounter (Signed)
Writer returned pt call regarding Lithium level. Writer explained that level is WNL and more accurate as she had not taken a dose on most recent lab draw, 7/19. Pt doesn't understand how "one pill" can make it go "sky high". Writer explained that the last level was high due to the fact that pt had taken dose that morning so it creats a false high, peak, reading Pt continues to c/o tremors and pt was offered referral to Neurology but says she is seeing a cardiologist about this and has an appointment on 07/19/20 for f/u as they have added a beta blocker for bp control. Pt will update nurse after that appointment.

## 2020-07-11 NOTE — Telephone Encounter (Signed)
This is much better than 1.5 mmol/l - high end of therapeutic range.

## 2020-07-12 ENCOUNTER — Other Ambulatory Visit (HOSPITAL_COMMUNITY): Payer: Self-pay | Admitting: Psychiatry

## 2020-07-25 ENCOUNTER — Telehealth: Payer: Self-pay | Admitting: Neurology

## 2020-07-25 NOTE — Telephone Encounter (Signed)
It is Ok to switch 

## 2020-07-25 NOTE — Telephone Encounter (Signed)
OK with me.

## 2020-07-25 NOTE — Telephone Encounter (Signed)
[name] is a returning pt of Dr Greer Pickerel she is being referred to our office for  Stumbling, dizziness and unsteady gait. Pt has requested to switch her care from Dr. Krista [name] to Dr. Jannifer Franklin.   Please advise if switch is ok.  Thank you

## 2020-07-27 ENCOUNTER — Institutional Professional Consult (permissible substitution): Payer: Medicare PPO | Admitting: Neurology

## 2020-07-27 ENCOUNTER — Telehealth (HOSPITAL_COMMUNITY): Payer: Self-pay | Admitting: *Deleted

## 2020-07-27 NOTE — Telephone Encounter (Signed)
Pt called with c/o "tremors getting worse" and wanting to speak to you directly. Pt next appointment on 09/27/20. Please review and advise.

## 2020-07-27 NOTE — Telephone Encounter (Signed)
Call returned.  Patient complaining of chronic tremors and now she has a scheduled to see neurology.  I reviewed her lithium level with her which is therapeutic.  She is taking lithium twice a day.  I explained that lithium can cause tremors but if she decided to stop then she should try to cut down to take 1 a day to see if that helps the tremors and she remains stable.  However if she started to feel worsening of the symptoms that she need to go back on lithium twice a day.  Patient agreed and she will keep appointment with neurology.

## 2020-09-27 ENCOUNTER — Other Ambulatory Visit: Payer: Self-pay

## 2020-09-27 ENCOUNTER — Telehealth (INDEPENDENT_AMBULATORY_CARE_PROVIDER_SITE_OTHER): Payer: Medicare PPO | Admitting: Psychiatry

## 2020-09-27 ENCOUNTER — Encounter (HOSPITAL_COMMUNITY): Payer: Self-pay | Admitting: Psychiatry

## 2020-09-27 VITALS — Wt 189.0 lb

## 2020-09-27 DIAGNOSIS — F319 Bipolar disorder, unspecified: Secondary | ICD-10-CM

## 2020-09-27 DIAGNOSIS — F411 Generalized anxiety disorder: Secondary | ICD-10-CM | POA: Diagnosis not present

## 2020-09-27 DIAGNOSIS — F09 Unspecified mental disorder due to known physiological condition: Secondary | ICD-10-CM

## 2020-09-27 DIAGNOSIS — F3110 Bipolar disorder, current episode manic without psychotic features, unspecified: Secondary | ICD-10-CM | POA: Diagnosis not present

## 2020-09-27 DIAGNOSIS — R251 Tremor, unspecified: Secondary | ICD-10-CM | POA: Diagnosis not present

## 2020-09-27 DIAGNOSIS — G3184 Mild cognitive impairment, so stated: Secondary | ICD-10-CM

## 2020-09-27 MED ORDER — BUPROPION HCL ER (XL) 300 MG PO TB24: 300.0000 mg | ORAL_TABLET | Freq: Every day | ORAL | 0 refills | Status: DC

## 2020-09-27 MED ORDER — CLONAZEPAM 1 MG PO TABS: 1.0000 mg | ORAL_TABLET | Freq: Every day | ORAL | 2 refills | Status: DC

## 2020-09-27 MED ORDER — HYDROXYZINE HCL 10 MG PO TABS: 10.0000 mg | ORAL_TABLET | Freq: Every day | ORAL | 0 refills | Status: DC

## 2020-09-27 MED ORDER — RISPERIDONE 1 MG PO TABS: 1.0000 mg | ORAL_TABLET | Freq: Every day | ORAL | 0 refills | Status: DC

## 2020-09-27 NOTE — Progress Notes (Signed)
Virtual Visit via Telephone Note  I connected with [name] on 09/27/20 at  2:20 PM EDT by telephone and verified that I am speaking with the correct person using two identifiers.  Location: Patient: home Provider: home office   I discussed the limitations, risks, security and privacy concerns of performing an evaluation and management service by telephone and the availability of in person appointments. I also discussed with the patient that there may be a patient responsible charge related to this service. The patient expressed understanding and agreed to proceed.   History of Present Illness: Patient is evaluated by phone session.  She is concerned about her tremors and she had called our office worrying about the tremors.  Her last lithium level was therapeutic.  We have cut down her lithium and now she is taking 600 mg only.  She admitted some depression and crying spells but overall she feels no major concern.  She is sleeping okay.  Her tremors got better but she still have tremors.  She also complaining of forgetfulness.  She is no longer taking Topamax.  Her cardiologist also adjust her heart medicine and she is only taking metoprolol.  Patient denies any paranoia, hallucination or any anger.  She admitted having anxiety.  She has upcoming appointment with Dr. Nathaneil Canary at [facility] neurology to discuss about memory and tremors.  She denies any suicidal thoughts.  Her appetite is okay.  She start walking every day with her husband and that helps her anxiety.  She lives with her husband who is very supportive.  Past Psychiatric History:Reviewed. H/Obipolar disorderandmultiple inpatient treatment. Last inpatientinSeptember 2014 for suicidal thinking,hallucinationasplaying with guns and superficially cut her wrist with a needle. H/Omania and psychosis.Tried Zyprexa Seroquelbut stopped due toweight gain. Tegretol causedincreased paranoia confusion and required inpatient.     Psychiatric Specialty Exam: Physical Exam  Review of Systems  There were no vitals taken for this visit.There is no height or weight on file to calculate BMI.  General Appearance: NA  Eye Contact:  NA  Speech:  Slow  Volume:  Decreased  Mood:  Anxious  Affect:  NA  Thought Process:  Descriptions of Associations: Intact  Orientation:  Full (Time, Place, and Person)  Thought Content:  Rumination  Suicidal Thoughts:  No  Homicidal Thoughts:  No  Memory:  Immediate;   Fair Recent;   Fair Remote;   Fair  Judgement:  Intact  Insight:  Present  Psychomotor Activity:  NA  Concentration:  Concentration: Fair and Attention Span: Fair  Recall:  [employer] of Knowledge:  Fair  Language:  Fair  Akathisia:  tremors  Handed:  Right  AIMS (if indicated):     Assets:  Communication Skills Desire for Improvement Housing Social Support  ADL's:  Intact  Cognition:  Impaired,  Mild  Sleep:   ok      Assessment and Plan: Bipolar disorder type I.  Generalized anxiety disorder.  Mild cognitive impairment.  Tremors.  Patient noticed mild improvement in her tremors since lithium dose was cut down.  However she is on lithium 600 mg for a long time and she had no concern until recently she started to have worsening of tremors.  I encouraged to keep appointment with Dr. Jannifer Franklin for tremors and memory impairment.  We discussed that psychotropic medication can cause tremors and EPS.  I recommend if she want we can try Risperdal 1 mg to see if that helps the therapist further.  She agreed with the  plan but in the past she had decompensated and we need to go up on the Risperdal.  However she is willing to give a try.  We will continue Wellbutrin XL 300 mg daily, Klonopin 1 mg at bedtime, she will reduce Risperdal to only 1 mg at bedtime, hydroxyzine 10 mg at bedtime and lithium 600 mg daily.  Recommended to call us back if she feels worsening of the symptom.  We will follow-up in 4-6 weeks.    Follow Up  Instructions:    I discussed the assessment and treatment plan with the patient. The patient was provided an opportunity to ask questions and all were answered. The patient agreed with the plan and demonstrated an understanding of the instructions.   The patient was advised to call back or seek an in-person evaluation if the symptoms worsen or if the condition fails to improve as anticipated.  I provided 25 minutes of non-face-to-face time during this encounter.   [name], MD

## 2020-10-03 ENCOUNTER — Ambulatory Visit: Payer: Medicare PPO | Admitting: Neurology

## 2020-10-03 ENCOUNTER — Telehealth: Payer: Self-pay | Admitting: Emergency Medicine

## 2020-10-03 ENCOUNTER — Encounter: Payer: Self-pay | Admitting: Neurology

## 2020-10-03 VITALS — BP 124/88 | HR 80 | Ht 64.0 in | Wt 194.5 lb

## 2020-10-03 DIAGNOSIS — E538 Deficiency of other specified B group vitamins: Secondary | ICD-10-CM | POA: Diagnosis not present

## 2020-10-03 DIAGNOSIS — R270 Ataxia, unspecified: Secondary | ICD-10-CM | POA: Diagnosis not present

## 2020-10-03 MED ORDER — AIMOVIG 140 MG/ML ~~LOC~~ SOAJ: 140.0000 mg | SUBCUTANEOUS | 4 refills | Status: DC

## 2020-10-03 NOTE — Progress Notes (Signed)
Reason for visit: Headache, history of meningioma,, gait ataxia  Referring physician: Dr. Perlie Mayo is a 59 y.o. female  History of present illness:  Ms. Adelstein is a 59 year old right-handed white female with a history of a meningioma resection from the left temporal area and 2000.  This was done in [place], [place].  The patient has had some headaches off and on since that time.  She initially was treated with various antiepileptic medications but she never actually had seizures.  The patient was seen through this office in 2017 for her headaches.  She was treated with Topamax without complete relief from the headache.  The headaches tend to be in the left frontotemporal area with occasional sharp shooting pains in the left occipital region.  The patient is having about 3-4 headaches a week that may last 4 to 5 hours.  She denies any nausea or vomiting or significant photophobia or phonophobia.  Sleep will help the headache.  She will take Excedrin Migraine if needed with some benefit, Imitrex in the past has been helpful as well.  The patient also has had some issues with gait instability that came on slowly and have gradually worsened since February 2021.  The patient had some onset of shortness of breath around that time as well.  The patient reports some neck pain on the left side without radiation down the arms.  This has been present for about 2 months.  She denies any low back pain or pain down the legs.  She has chronic issues with urinary urgency and incontinence.  She denies any dizziness or blacking out episodes or visual changes.  She does report some troubles with the memory.  She has bipolar disorder and has been on lithium for at least 15 years.  She has developed tremors in both hands.  She is followed through [facility] neurosurgery for her meningioma, her last scan was done on 27 July 2019, shows a 4 mm meningioma, stable.  She is followed every 2 years with the scan.  She  comes here for further evaluation.  Past Medical History:  Diagnosis Date  . Anxiety   . Bipolar disorder ([place])   . Brain tumor (benign) ([place])   . Depression   . HTN (hypertension)   . Mania ([place])   . Meningioma ([place])   . Migraine     Past Surgical History:  Procedure Laterality Date  . ABDOMINAL HYSTERECTOMY    . BRAIN SURGERY    . breast tumor removal    . INCONTINENCE SURGERY    . INTERSTIM IMPLANT PLACEMENT    . KNEE SURGERY     right x 2  . PLANTAR FASCIECTOMY Left 03/05/16  . TUBAL LIGATION      Family History  Problem Relation Age of Onset  . Alcohol abuse Father   . Diabetes Father   . Mental retardation Father   . Drug abuse Brother   . Mental illness Mother   . Breast cancer Mother   . Dementia Neg Hx   . ADD / ADHD Neg Hx   . Anxiety disorder Neg Hx   . Bipolar disorder Neg Hx   . Depression Neg Hx   . OCD Neg Hx   . Paranoid behavior Neg Hx   . Schizophrenia Neg Hx   . Seizures Neg Hx   . Sexual abuse Neg Hx   . Physical abuse Neg Hx   . Suicidality Neg Hx  Social history:  reports that she has never smoked. She has never used smokeless tobacco. She reports that she does not drink alcohol and does not use drugs.  Medications:  Prior to Admission medications   Medication Sig Start Date End Date Taking? Authorizing Provider  aspirin-acetaminophen-caffeine (EXCEDRIN MIGRAINE) [phone] MG tablet Take 2 tablets by mouth every 6 (six) hours as needed for migraine.   Yes [provider]  buPROPion (WELLBUTRIN XL) 300 MG 24 hr tablet Take 1 tablet (300 mg total) by mouth daily. TAKE ONE TABLET BY MOUTH EVERY MORINING FOR DEPRESSION 09/27/20  Yes Arfeen, Arlyce Harman, MD  clonazePAM (KLONOPIN) 1 MG tablet Take 1 tablet (1 mg total) by mouth at bedtime. 09/27/20  Yes Arfeen, Arlyce Harman, MD  hydrOXYzine (ATARAX/VISTARIL) 10 MG tablet Take 1 tablet (10 mg total) by mouth at bedtime. 09/27/20  Yes Arfeen, Arlyce Harman, MD  ibuprofen (ADVIL,MOTRIN) 800 MG tablet Take  1 tablet (800 mg total) by mouth 3 (three) times daily. 12/16/17  Yes Noemi Chapel, MD  lithium 600 MG capsule Take 1 capsule (600 mg total) by mouth 2 (two) times daily with a meal. Patient taking differently: Take 600 mg by mouth daily.  06/29/20  Yes Arfeen, Arlyce Harman, MD  metoprolol tartrate (LOPRESSOR) 25 MG tablet Take 12.5 mg by mouth daily.   Yes [provider]  risperiDONE (RISPERDAL) 1 MG tablet Take 1 tablet (1 mg total) by mouth at bedtime. FOR MOOD CONTROL 09/27/20  Yes Arfeen, Arlyce Harman, MD  amLODipine (NORVASC) 2.5 MG tablet  02/16/20   [provider]  pantoprazole (PROTONIX) 20 MG tablet Take 1 tablet (20 mg total) by mouth daily. 02/12/20 03/13/20  Eustaquio Maize, PA-C      Allergies  Allergen Reactions  . Gabapentin Anaphylaxis  . Oxcarbazepine Anaphylaxis  . Tegretol [Carbamazepine] Nausea And Vomiting  . Codeine Nausea Only  . Divalproex Sodium Swelling  . Erythromycin Nausea And Vomiting  . Phenytoin Sodium Extended Swelling  . Phenytoin Sodium Extended Rash    ROS:  Out of a complete 14 system review of symptoms, the patient complains only of the following symptoms, and all other reviewed systems are negative.  Walking difficulty Headache Tremor Memory problems  Blood pressure 124/88, pulse 80, height 5\' 4"  (1.626 m), weight 194 lb 8 oz (88.2 kg).  Physical Exam  General: The patient is alert and cooperative at the time of the examination.  The patient is moderately obese.  Eyes: Pupils are equal, round, and reactive to light. Discs are flat bilaterally.  Neck: The neck is supple, no carotid bruits are noted.  Respiratory: The respiratory examination is clear.  Cardiovascular: The cardiovascular examination reveals a regular rate and rhythm, no obvious murmurs or rubs are noted.  Skin: Extremities are without significant edema.  Neurologic Exam  Mental status: The patient is alert and oriented x 3 at the time of the examination. The  patient has apparent normal recent and remote memory, with an apparently normal attention span and concentration ability.  Cranial nerves: Facial symmetry is present. There is good sensation of the face to pinprick and soft touch bilaterally. The strength of the facial muscles and the muscles to head turning and shoulder shrug are normal bilaterally. Speech is well enunciated, no aphasia or dysarthria is noted. Extraocular movements are full. Visual fields are full. The tongue is midline, and the patient has symmetric elevation of the soft palate. No obvious hearing deficits are noted.  Motor: The motor testing reveals  5 over 5 strength of all 4 extremities. Good symmetric motor tone is noted throughout.  Sensory: Sensory testing is intact to pinprick, soft touch, vibration sensation, and position sense on all 4 extremities. No evidence of extinction is noted.  Coordination: Cerebellar testing reveals good finger-nose-finger and heel-to-shin bilaterally.  Mild tremors with arms outstretched are seen in the hands.  Gait and station: Gait is normal. Tandem gait is unsteady. Romberg is negative. No drift is seen.  Reflexes: Deep tendon reflexes are symmetric and normal bilaterally, with exception of some depression of ankle jerk reflexes bilaterally. Toes are downgoing bilaterally.   Assessment/Plan:  1.  History of headache, possible migraine  2.  Left temporal meningioma resection in the past  3.  Bipolar disorder, chronic lithium use  4.  Tremor, likely related to lithium  5.  Mild gait instability, possibly related to lithium  The patient will undergo a repeat MRI of the brain.  Blood work will be done today.  She will be set up for physical therapy for gait training.  She will be placed on Aimovig for her headache.  In the past, she has been on Topamax without benefit, she is on a beta-blocker currently, she takes Norvasc which is a calcium channel blocker.  She has been on gabapentin  and Depakote previously but has had allergies to these medications.  She will follow-up here in 4 months.  The patient has been on chronic lithium therapy, this may lead to cerebellar toxicity, commonly will cause permanent tremors and may cause problems with dysmetria and coordination of the arms and legs.  Jill Alexanders MD 10/03/2020 10:42 AM  [facility] [address] [place], [place] [address]  Phone [phone] Fax [phone]

## 2020-10-03 NOTE — Telephone Encounter (Signed)
Aimovig PA started in [facility], [id number].  Awaiting determination from [facility].

## 2020-10-03 NOTE — Patient Instructions (Signed)
We will start [place] for headache prevention.

## 2020-10-03 NOTE — Telephone Encounter (Signed)
Aimovig approved.    PA Case: [id number], Status: Approved, Coverage Starts on: 10/03/2020 12:00:00 AM, Coverage Ends on: 01/01/2021 12:00:00 AM.

## 2020-10-04 NOTE — Telephone Encounter (Signed)
PA Case: [id number], Status: Approved, Coverage Starts on: 10/03/2020 12:00:00 AM, Coverage Ends on: 01/01/2021 12:00:00 AM. Questions? Contact [phone].  Patient notified by MyChart.

## 2020-10-05 ENCOUNTER — Telehealth: Payer: Self-pay | Admitting: Neurology

## 2020-10-05 DIAGNOSIS — D329 Benign neoplasm of meninges, unspecified: Secondary | ICD-10-CM

## 2020-10-05 NOTE — Telephone Encounter (Signed)
Pt. States Dr. Jannifer Franklin made recommendation of a MRI. Pt. Is asking to be called to move forward with MRI.

## 2020-10-05 NOTE — Telephone Encounter (Signed)
I will order MRI of the brain.  

## 2020-10-05 NOTE — Addendum Note (Signed)
Addended by: Kathrynn Ducking on: 10/05/2020 02:28 PM   Modules accepted: Orders

## 2020-10-08 LAB — COMPREHENSIVE METABOLIC PANEL
ALT: 10 IU/L (ref 0–32)
AST: 11 IU/L (ref 0–40)
Albumin/Globulin Ratio: 2.1 (ref 1.2–2.2)
Albumin: 4.6 g/dL (ref 3.8–4.9)
Alkaline Phosphatase: 94 IU/L (ref 44–121)
BUN/Creatinine Ratio: 13 (ref 9–23)
BUN: 13 mg/dL (ref 6–24)
Bilirubin Total: 0.5 mg/dL (ref 0.0–1.2)
CO2: 22 mmol/L (ref 20–29)
Calcium: 10.3 mg/dL — ABNORMAL HIGH (ref 8.7–10.2)
Chloride: 106 mmol/L (ref 96–106)
Creatinine, Ser: 1.03 mg/dL — ABNORMAL HIGH (ref 0.57–1.00)
GFR calc Af Amer: 70 mL/min/{1.73_m2} (ref >59)
GFR calc non Af Amer: 60 mL/min/{1.73_m2} (ref >59)
Globulin, Total: 2.2 g/dL (ref 1.5–4.5)
Glucose: 91 mg/dL (ref 65–99)
Potassium: 4.6 mmol/L (ref 3.5–5.2)
Sodium: 141 mmol/L (ref 134–144)
Total Protein: 6.8 g/dL (ref 6.0–8.5)

## 2020-10-08 LAB — RPR: RPR Ser Ql: NONREACTIVE

## 2020-10-08 LAB — VITAMIN B12: Vitamin B-12: 272 pg/mL (ref 232–1245)

## 2020-10-08 LAB — COPPER, SERUM: Copper: 115 ug/dL (ref 80–158)

## 2020-10-08 LAB — HIV ANTIBODY (ROUTINE TESTING W REFLEX): HIV Screen 4th Generation wRfx: NONREACTIVE

## 2020-10-08 LAB — SEDIMENTATION RATE: Sed Rate: 12 mm/hr (ref 0–40)

## 2020-10-08 LAB — VITAMIN E
Vitamin E (Alpha Tocopherol): 12.3 mg/L (ref 7.0–25.1)
Vitamin E(Gamma Tocopherol): 1.7 mg/L (ref 0.5–5.5)

## 2020-10-09 NOTE — Telephone Encounter (Signed)
[name]: [id number] (exp. 10/09/20 to 11/08/20) patient is scheduled at [facility] for 10/12/20 arrival time is 6:30 AM. I spoke with the patient she is aware of time and day I also gave her their number of [phone] incase she needed to r/s.

## 2020-10-12 ENCOUNTER — Telehealth: Payer: Self-pay | Admitting: Neurology

## 2020-10-12 ENCOUNTER — Ambulatory Visit (HOSPITAL_COMMUNITY)
Admission: RE | Admit: 2020-10-12 | Discharge: 2020-10-12 | Disposition: A | Discharge status: Home / Self Care | Payer: Medicare PPO | Source: Ambulatory Visit | Attending: Neurology | Admitting: Neurology

## 2020-10-12 ENCOUNTER — Telehealth (HOSPITAL_COMMUNITY): Payer: Self-pay | Admitting: *Deleted

## 2020-10-12 ENCOUNTER — Other Ambulatory Visit: Payer: Self-pay

## 2020-10-12 DIAGNOSIS — D329 Benign neoplasm of meninges, unspecified: Secondary | ICD-10-CM | POA: Insufficient documentation

## 2020-10-12 MED ORDER — GADOBUTROL 1 MMOL/ML IV SOLN
7.0000 mL | Freq: Once | INTRAVENOUS | Status: AC | PRN
  Administered 2020-10-12: 7 mL via INTRAVENOUS

## 2020-10-12 NOTE — Telephone Encounter (Signed)
I called the patient.  MRI of the brain shows no significant abnormalities, no recurrence of meningioma.  I believe that the tremors and some of the ataxia issues are likely related to long-term treatment with lithium.  The patient will check into switching off the medication to another drug to treat her bipolar disorder.   MRI brain 10/12/20:  IMPRESSION: Enhancement underlying left craniotomy is likely postoperative in nature. No new or progressive nodular enhancement to suggest recurrent meningioma.

## 2020-10-12 NOTE — Telephone Encounter (Signed)
Patient called and stated after the results of her MRI the MD Margette Fast) had told her she needed to go off of Lithium.  His note is in chart review.  She requested a call from you to discuss changing meds.  Please review and advise.

## 2020-10-12 NOTE — Telephone Encounter (Signed)
I returned patient's phone call.  I reviewed notes from neurology.  Patient has an MRI and she was told her tremors may be consistent with ataxia.  She was recommended to discontinue lithium.  I discussed with the patient to stop the lithium.  She is already taking only 600 mg which we are gradually decreasing slowly.  In the past she had symptoms come back when she stopped the lithium and she is somewhat concerned about given the fact that she has tremors she agree to try stopping the lithium.  She will remain on Klonopin, Risperdal, Wellbutrin.  If needed we will consider adjusting the dose of Risperdal however I explained most of the psychotropic medication does cause tremors.  She agreed to give a try stopping the lithium and call us back if symptoms started to get worse.

## 2020-10-23 ENCOUNTER — Telehealth: Payer: Self-pay | Admitting: Neurology

## 2020-10-23 MED ORDER — SUMATRIPTAN SUCCINATE 50 MG PO TABS: 50.0000 mg | ORAL_TABLET | ORAL | 11 refills | Status: DC | PRN

## 2020-10-23 NOTE — Telephone Encounter (Signed)
Meds ordered this encounter  Medications  . SUMAtriptan (IMITREX) 50 MG tablet    Sig: Take 1 tablet (50 mg total) by mouth every 2 (two) hours as needed for migraine. May repeat in 2 hours if headache persists or recurs.    Dispense:  12 tablet    Refill:  11

## 2020-10-23 NOTE — Addendum Note (Signed)
Addended by: Marcial Pacas on: 10/23/2020 04:12 PM   Modules accepted: Orders

## 2020-10-23 NOTE — Telephone Encounter (Signed)
Called patient back and wants something else for her headaches.  Patient has had only 1 dose of Aimovig, stated that has helped in frequency but when she gets a headache, it is the same.  I asked her what has helped her in the past and she stated 'imitrex'.  Patient is asking for a prescription of imitrex to be called into her pharmacy.  Patient aware Dr. Jannifer Franklin isn't here this week and request will be sent to the [facility] (Dr. Krista Blue).

## 2020-10-23 NOTE — Telephone Encounter (Signed)
Pt called and LVM stating she is needing to speak to RN regarding a prescription for her headaches. No other information was given. Please advise.

## 2020-10-24 NOTE — Telephone Encounter (Signed)
Called patient back and informed her Dr.Yan called patient in prescription for Imitrex and should be available to pick up at her pharmacy.  Patient expressed appreciation.

## 2020-11-07 ENCOUNTER — Encounter (HOSPITAL_COMMUNITY): Payer: Self-pay | Admitting: Psychiatry

## 2020-11-07 ENCOUNTER — Other Ambulatory Visit: Payer: Self-pay

## 2020-11-07 ENCOUNTER — Telehealth (INDEPENDENT_AMBULATORY_CARE_PROVIDER_SITE_OTHER): Payer: Medicare PPO | Admitting: Psychiatry

## 2020-11-07 DIAGNOSIS — F411 Generalized anxiety disorder: Secondary | ICD-10-CM

## 2020-11-07 DIAGNOSIS — F319 Bipolar disorder, unspecified: Secondary | ICD-10-CM

## 2020-11-07 MED ORDER — CLONAZEPAM 1 MG PO TABS: 1.0000 mg | ORAL_TABLET | Freq: Every day | ORAL | 2 refills | Status: DC

## 2020-11-07 MED ORDER — BUPROPION HCL ER (XL) 300 MG PO TB24: 300.0000 mg | ORAL_TABLET | Freq: Every day | ORAL | 0 refills | Status: DC

## 2020-11-07 MED ORDER — HYDROXYZINE PAMOATE 25 MG PO CAPS: 25.0000 mg | ORAL_CAPSULE | Freq: Every evening | ORAL | 0 refills | Status: DC | PRN

## 2020-11-07 MED ORDER — RISPERIDONE 1 MG PO TABS: 1.0000 mg | ORAL_TABLET | Freq: Every day | ORAL | 0 refills | Status: DC

## 2020-11-07 NOTE — Progress Notes (Signed)
Virtual Visit via Telephone Note  I connected with [name] on 11/07/20 at  2:00 PM EST by telephone and verified that I am speaking with the correct person using two identifiers.  Location: Patient: Home Provider: Home Office   I discussed the limitations, risks, security and privacy concerns of performing an evaluation and management service by telephone and the availability of in person appointments. I also discussed with the patient that there may be a patient responsible charge related to this service. The patient expressed understanding and agreed to proceed.   History of Present Illness: Patient is evaluated by phone session.  She recently seen the neurologist for tremors and headaches.  She is no longer taking lithium which her neurologist believe is the reason.  However she still have tremors but it is not as bad.  She has blood work and had neuroimaging studies.  She was told that her tremors may be chronic.  Her BUN/creatinine is a stable.  She feels since we cut down the Risperdal her sleep is been an issue.  She has headaches and she was given Imitrex but it make her very sleepy.  She is taking hydroxyzine 10 mg which helps some of the anxiety but she is struggled with insomnia.  She denies any mania, psychosis, hallucination.  She denies any crying spells or any feeling of hopelessness.  She is no longer taking Topamax and noticed her appetite is increased but weight is stable.  She is taking Klonopin, Wellbutrin.  She is sad because her oldest son is not communicating and refused to come for Thanksgiving and she is not able to see the grandkids.  She lives with her husband is very supportive.   Psychiatric Specialty Exam: Physical Exam  Review of Systems  Weight 195 lb (88.5 kg).There is no height or weight on file to calculate BMI.  General Appearance: NA  Eye Contact:  NA  Speech:  Slow  Volume:  Decreased  Mood:  Anxious  Affect:  NA  Thought Process:  Goal Directed   Orientation:  Full (Time, Place, and Person)  Thought Content:  Logical  Suicidal Thoughts:  No  Homicidal Thoughts:  No  Memory:  Immediate;   Fair Recent;   Fair Remote;   Fair  Judgement:  Intact  Insight:  Present  Psychomotor Activity:  NA  Concentration:  Concentration: Fair and Attention Span: Fair  Recall:  [employer] of Knowledge:  Good  Language:  Good  Akathisia:  No  Handed:  Right  AIMS (if indicated):     Assets:  Communication Skills Desire for Improvement Housing Social Support  ADL's:  Intact  Cognition:  Impaired,  Mild  Sleep:   fair      Assessment and Plan: Bipolar disorder type I.  Anxiety.  Mild cognitive impairment.  Tremors.  I reviewed notes from neurology, chronic blood work results and neuroimaging studies.  She is no longer taking lithium and her tremors has been stable and not getting worse.  She wanted to go back on Risperdal 2 mg however I recommended to stay on 1 mg since it may cause tremors worsening.  Recommend to take hydroxyzine 25 mg at bedtime to help her anxiety and insomnia.  She agreed with the plan.  We will continue Wellbutrin XL 300 mg daily, Klonopin 1 mg at bedtime.  Discussed medication side effects and benefits.  Recommended to call us back if she is any question or any concern.  Follow-up in 2  months  Follow Up Instructions:    I discussed the assessment and treatment plan with the patient. The patient was provided an opportunity to ask questions and all were answered. The patient agreed with the plan and demonstrated an understanding of the instructions.   The patient was advised to call back or seek an in-person evaluation if the symptoms worsen or if the condition fails to improve as anticipated.  I provided 21 minutes of non-face-to-face time during this encounter.   [name], MD

## 2020-11-30 ENCOUNTER — Telehealth: Payer: Self-pay | Admitting: Neurology

## 2020-11-30 NOTE — Telephone Encounter (Signed)
I called the patient.  The patient has not gained much benefit with the first Aimovig injection, she is due for the next injection tonight, I would strongly recommend she get the injection, she is now having 3 or 4 headaches a month.  If Aimovig does not help, she may be a candidate for Botox injections.

## 2020-11-30 NOTE — Telephone Encounter (Signed)
Pt called stating she is needing to discuss her headaches she has been having. Pt states they have been getting worse for the last week or so. Please advise.

## 2020-11-30 NOTE — Telephone Encounter (Signed)
Patient states she used the Aimovig for the first time two weeks and now having worsened headaches, on left side of head, going down her next.  She has used the imitrex x2 as well as Excedrin for Migrains and drinking caffeine and stated "only cut it in half".  When she shuts her eyes or lays down it eases up and go away.

## 2021-01-03 ENCOUNTER — Telehealth (INDEPENDENT_AMBULATORY_CARE_PROVIDER_SITE_OTHER): Payer: Medicare PPO | Admitting: Psychiatry

## 2021-01-03 ENCOUNTER — Other Ambulatory Visit: Payer: Self-pay

## 2021-01-03 ENCOUNTER — Encounter (HOSPITAL_COMMUNITY): Payer: Self-pay | Admitting: Psychiatry

## 2021-01-03 VITALS — Wt 193.0 lb

## 2021-01-03 DIAGNOSIS — F09 Unspecified mental disorder due to known physiological condition: Secondary | ICD-10-CM

## 2021-01-03 DIAGNOSIS — F411 Generalized anxiety disorder: Secondary | ICD-10-CM

## 2021-01-03 DIAGNOSIS — F319 Bipolar disorder, unspecified: Secondary | ICD-10-CM | POA: Diagnosis not present

## 2021-01-03 MED ORDER — HYDROXYZINE HCL 10 MG PO TABS: 10.0000 mg | ORAL_TABLET | Freq: Every day | ORAL | 0 refills | Status: DC

## 2021-01-03 MED ORDER — BUPROPION HCL ER (XL) 300 MG PO TB24: 300.0000 mg | ORAL_TABLET | Freq: Every day | ORAL | 0 refills | Status: DC

## 2021-01-03 MED ORDER — CLONAZEPAM 1 MG PO TABS: 1.0000 mg | ORAL_TABLET | Freq: Every day | ORAL | 2 refills | Status: DC

## 2021-01-03 MED ORDER — RISPERIDONE 2 MG PO TABS: 1.0000 mg | ORAL_TABLET | Freq: Every day | ORAL | 0 refills | Status: DC

## 2021-01-03 NOTE — Progress Notes (Signed)
Virtual Visit via Telephone Note  I connected with [name] on 01/03/21 at  2:00 PM EST by telephone and verified that I am speaking with the correct person using two identifiers.  Location: Patient: Home Provider: Home Office   I discussed the limitations, risks, security and privacy concerns of performing an evaluation and management service by telephone and the availability of in person appointments. I also discussed with the patient that there may be a patient responsible charge related to this service. The patient expressed understanding and agreed to proceed.   History of Present Illness: Patient is evaluated by phone session.  She is on the phone by herself.  She is not taking lithium anymore.  Her tremors are better but are still she experienced them.  Her biggest concern is poor sleep, racing thoughts and she noticed her mood swings are coming back.  She gets easily irritable.  She denies any paranoia or any hallucination.  We tried hydroxyzine 25 mg but she could not tolerate the higher dose and she is back on 10 mg.  Her headaches are better since she is getting injection.  She reported her Christmas was good because both of her son came to visit her.  She wants to try something to help her mood irritability and sleep.  She is taking Risperdal 1 mg along with Klonopin, Wellbutrin.  In the past she has taken Risperdal 2 mg however with the concerns of tremors it was reduced to 1 mg.  Now she noticed her tremors are may be due to lithium she like to go back on higher dose.  She is also taking multiple medication for her blood pressure and still she struggle sometimes.  She is hoping to see her physician soon to address this issue.   Past Psychiatric History:Reviewed. H/Obipolar disorderandmultiple inpatient treatment. Last inpatientinSeptember 2014 for suicidal thinking,hallucinationasplaying with guns and superficially cut her wrist with a needle. H/Omania and  psychosis.Tried Zyprexa Seroquelbut stopped due toweight gain. Tegretol causedincreased paranoia confusion and required inpatient.   Psychiatric Specialty Exam: Physical Exam  Review of Systems  Weight 193 lb (87.5 kg).There is no height or weight on file to calculate BMI.  General Appearance: NA  Eye Contact:  NA  Speech:  Slow  Volume:  Decreased  Mood:  Anxious and Irritable  Affect:  NA  Thought Process:  Goal Directed  Orientation:  Full (Time, Place, and Person)  Thought Content:  Rumination  Suicidal Thoughts:  No  Homicidal Thoughts:  No  Memory:  Immediate;   Good Recent;   Fair Remote;   Fair  Judgement:  Intact  Insight:  Fair  Psychomotor Activity:  NA  Concentration:  Concentration: Fair and Attention Span: Fair  Recall:  [employer] of Knowledge:  Good  Language:  Good  Akathisia:  mild tremors  Handed:  Right  AIMS (if indicated):     Assets:  Communication Skills Desire for Improvement Housing Resilience Social Support  ADL's:  Intact  Cognition:  Impaired,  Mild  Sleep:   fair      Assessment and Plan: Bipolar disorder type I.  Anxiety.  Mild cognitive impairment.    Discussed residual mood lability and insomnia.  We will try Risperdal 2 mg however if tremor get worse then she need to go back on 1 mg.  I recommend she can also take over-the-counter melatonin for sleep.  She had never tried before.  In the past Zyprexa and Seroquel has caused weight gain.  Since not taking lithium her tremors are not as bad.  Continue Klonopin 1 mg at bedtime, reduce hydroxyzine 10 mg only at bedtime, continue Wellbutrin XL 300 mg daily and we will try Risperdal 1- 2 mg at bedtime.  Recommended to call us back if she is any question or any concern.  Follow-up continues.  Follow Up Instructions:    I discussed the assessment and treatment plan with the patient. The patient was provided an opportunity to ask questions and all were answered. The patient agreed  with the plan and demonstrated an understanding of the instructions.   The patient was advised to call back or seek an in-person evaluation if the symptoms worsen or if the condition fails to improve as anticipated.  I provided 18 minutes of non-face-to-face time during this encounter.   [name], MD

## 2021-01-18 ENCOUNTER — Telehealth: Payer: Self-pay | Admitting: Emergency Medicine

## 2021-01-18 NOTE — Telephone Encounter (Signed)
Attempted to PA for Aimovig, received this message from [place] already on file for this request.Authorization starting on 01/02/2021 and ending on 01/01/2022.

## 2021-02-06 ENCOUNTER — Encounter: Payer: Self-pay | Admitting: Neurology

## 2021-02-06 ENCOUNTER — Ambulatory Visit: Payer: Medicare PPO | Admitting: Neurology

## 2021-02-06 DIAGNOSIS — G43019 Migraine without aura, intractable, without status migrainosus: Secondary | ICD-10-CM

## 2021-02-06 DIAGNOSIS — R251 Tremor, unspecified: Secondary | ICD-10-CM | POA: Diagnosis not present

## 2021-02-06 HISTORY — DX: Migraine without aura, intractable, without status migrainosus: G43.019

## 2021-02-06 HISTORY — DX: Tremor, unspecified: R25.1

## 2021-02-06 NOTE — Progress Notes (Signed)
Reason for visit: Migraine headache, gait instability, tremor  [name] is an 60 y.o. female  History of present illness:  [name] is a 60 year old right-handed white female with a history of migraine headaches.  She has had a meningioma resected on the left side of the head and her headaches tend to be on the side.  She was having very frequent severe headaches until she went on Aimovig.  After the second injection, the headaches essentially went away, she has had only 1 headache over the last month.  The patient continues to have difficulty with the balance, this is felt to be at least in part related to chronic lithium toxicity.  She has gone off of lithium with some improvement in her tremor but not with her balance.  She was set up for physical therapy but she indicates that her insurance company would not pay for physical therapy.  The patient has not had any falls, she may stumble on occasion.  She will use a cane or a walking stick when she is outside.  She is on Risperdal.  She will note on occasion that she has some mild resting tremor with the left arm.  She comes back for further evaluation.  Past Medical History:  Diagnosis Date  . Anxiety   . Bipolar disorder ([place])   . Brain tumor (benign) ([place])   . Depression   . HTN (hypertension)   . Mania ([place])   . Meningioma ([place])   . Migraine     Past Surgical History:  Procedure Laterality Date  . ABDOMINAL HYSTERECTOMY    . BRAIN SURGERY    . breast tumor removal    . INCONTINENCE SURGERY    . INTERSTIM IMPLANT PLACEMENT    . KNEE SURGERY     right x 2  . PLANTAR FASCIECTOMY Left 03/05/16  . TUBAL LIGATION      Family History  Problem Relation Age of Onset  . Alcohol abuse Father   . Diabetes Father   . Mental retardation Father   . Drug abuse Brother   . Mental illness Mother   . Breast cancer Mother   . Dementia Neg Hx   . ADD / ADHD Neg Hx   . Anxiety disorder Neg Hx   . Bipolar disorder Neg Hx   .  Depression Neg Hx   . OCD Neg Hx   . Paranoid behavior Neg Hx   . Schizophrenia Neg Hx   . Seizures Neg Hx   . Sexual abuse Neg Hx   . Physical abuse Neg Hx   . Suicidality Neg Hx     Social history:  reports that she has never smoked. She has never used smokeless tobacco. She reports that she does not drink alcohol and does not use drugs.    Allergies  Allergen Reactions  . Gabapentin Anaphylaxis  . Oxcarbazepine Anaphylaxis  . Tegretol [Carbamazepine] Nausea And Vomiting  . Codeine Nausea Only  . Divalproex Sodium Swelling  . Erythromycin Nausea And Vomiting  . Phenytoin Sodium Extended Swelling  . Phenytoin Sodium Extended Rash    Medications:  Prior to Admission medications   Medication Sig Start Date End Date Taking? Authorizing Provider  amLODipine (NORVASC) 2.5 MG tablet  02/16/20  Yes [provider]  aspirin-acetaminophen-caffeine (EXCEDRIN MIGRAINE) [phone] MG tablet Take 2 tablets by mouth every 6 (six) hours as needed for migraine.   Yes [provider]  buPROPion (WELLBUTRIN XL) 300 MG 24 hr  tablet Take 1 tablet (300 mg total) by mouth daily. TAKE ONE TABLET BY MOUTH EVERY MORINING FOR DEPRESSION 01/03/21  Yes Arfeen, Arlyce Harman, MD  clonazePAM (KLONOPIN) 1 MG tablet Take 1 tablet (1 mg total) by mouth at bedtime. 01/03/21  Yes Arfeen, Arlyce Harman, MD  Erenumab-aooe (AIMOVIG) 140 MG/ML SOAJ Inject 140 mg into the skin every 30 (thirty) days. 10/03/20  Yes Kathrynn Ducking, MD  hydrOXYzine (ATARAX/VISTARIL) 10 MG tablet Take 1 tablet (10 mg total) by mouth at bedtime. 01/03/21  Yes Arfeen, Arlyce Harman, MD  hydrOXYzine (VISTARIL) 25 MG capsule Take 1 capsule (25 mg total) by mouth at bedtime as needed. 11/07/20  Yes Arfeen, Arlyce Harman, MD  ibuprofen (ADVIL,MOTRIN) 800 MG tablet Take 1 tablet (800 mg total) by mouth 3 (three) times daily. 12/16/17  Yes Noemi Chapel, MD  metoprolol tartrate (LOPRESSOR) 25 MG tablet Take 12.5 mg by mouth daily.   Yes [provider]  risperiDONE (RISPERDAL) 2 MG tablet Take 0.5-1 tablets (1-2 mg total) by mouth at bedtime. FOR MOOD CONTROL 01/03/21  Yes Arfeen, Arlyce Harman, MD  SUMAtriptan (IMITREX) 50 MG tablet Take 1 tablet (50 mg total) by mouth every 2 (two) hours as needed for migraine. May repeat in 2 hours if headache persists or recurs. 10/23/20  Yes Marcial Pacas, MD    ROS:  Out of a complete 14 system review of symptoms, the patient complains only of the following symptoms, and all other reviewed systems are negative.  Tremor Gait instability Headache  Blood pressure 131/89, pulse 83, height 5\' 4"  (1.626 m), weight 194 lb 9.6 oz (88.3 kg).  Physical Exam  General: The patient is alert and cooperative at the time of the examination.  Skin: No significant peripheral edema is noted.   Neurologic Exam  Mental status: The patient is alert and oriented x 3 at the time of the examination. The patient has apparent normal recent and remote memory, with an apparently normal attention span and concentration ability.   Cranial nerves: Facial symmetry is present. Speech is normal, no aphasia or dysarthria is noted. Extraocular movements are full. Visual fields are full.  Mild masking of the face is seen.  Motor: The patient has good strength in all 4 extremities.  Sensory examination: Soft touch sensation is symmetric on the face, arms, and legs.  Coordination: The patient has good finger-nose-finger and heel-to-shin bilaterally.  No significant tremor was noted.  Gait and station: The patient has the ability to walk independently.  Good bilateral arm swing is seen.  Tandem gait is unsteady.  Romberg is unsteady, she tends to fall to the left.  Reflexes: Deep tendon reflexes are symmetric.   MRI brain 10/12/20:  IMPRESSION: Enhancement underlying left craniotomy is likely postoperative in nature. No new or progressive nodular enhancement to suggest recurrent meningioma.  * MRI scan images were reviewed  online. I agree with the written report.    Assessment/Plan:  1.  Migraine headache  2.  History of left sided meningioma  3.  Gait instability  4.  Tremor  On clinical examination, the patient may have some mild parkinsonism, she does report a resting tremor of the left arm.  This may be related to the Risperdal.  Her chronic gait issue is likely related to the chronic use of lithium, this may not improve even off of the medication.  We will follow the gait problems over time.  The patient has gained good benefit with Aimovig, her headaches are  under excellent control currently.  We will follow up in 4 months.  She uses Imitrex if needed for the headache.  We will need to watch for worsening signs of parkinsonism.  Jill Alexanders MD 02/06/2021 11:06 AM  [facility] [address] [place], [place] [address]  Phone [phone] Fax [phone]

## 2021-02-13 ENCOUNTER — Other Ambulatory Visit (HOSPITAL_COMMUNITY): Payer: Self-pay | Admitting: *Deleted

## 2021-02-13 ENCOUNTER — Telehealth (HOSPITAL_COMMUNITY): Payer: Self-pay | Admitting: *Deleted

## 2021-02-13 NOTE — Telephone Encounter (Signed)
Ok. Am trying to get [place] pre approved.

## 2021-02-13 NOTE — Telephone Encounter (Signed)
Pt called to inform that she recently saw neurologist, Dr. Jannifer Franklin @ [facility], and he observed hand tremors which may be r/t Risperdal. Pt stated that her PCP told her the same thing and she is wondering if medication should be changed or anticholinergic may be added. She says she cannot afford "the new expensive medication" for TD.  Pt next appointment with you on 03/06/21. Please review and advise. Thanks.

## 2021-02-13 NOTE — Telephone Encounter (Signed)
On the last visit we did talk about [name] because tremors which she believes it was caused by lithium which she has discontinued and did not felt the tremors are worse.  We increased the Risperdal to 2 mg and I do believe she may need benztropine to help those tremors.  However benztropine is anticholinergic medication which may cause constipation and sometimes memory impairment.  If she decided to try benztropine 0.5 mg at bedtime then she need to stop the hydroxyzine as together can cause worsening of anticholinergic side effects.  If she agree to try benztropine please call her pharmacy and discontinue hydroxyzine.

## 2021-02-14 ENCOUNTER — Telehealth (HOSPITAL_COMMUNITY): Payer: Self-pay | Admitting: *Deleted

## 2021-02-14 ENCOUNTER — Other Ambulatory Visit (HOSPITAL_COMMUNITY): Payer: Self-pay | Admitting: *Deleted

## 2021-02-14 MED ORDER — INGREZZA 40 MG PO CAPS: 40.0000 mg | ORAL_CAPSULE | Freq: Every day | ORAL | 0 refills | Status: DC

## 2021-02-14 NOTE — Telephone Encounter (Signed)
We can discussed on her next appointment. Most of the meds have metabolic side effects. She is table on Risperdal for now. Please scan and put lab values in chart.

## 2021-02-14 NOTE — Telephone Encounter (Signed)
Discussed patients labs and Risperdal with Dr Adele Schilder.  Called patient to tell her that Dr Adele Schilder wants her to continue Risperdal as all medications have potential side effects.  Discussed diet and cholesterol with patient.  Patient watching her diet.  Patient agrees to plan.

## 2021-02-14 NOTE — Telephone Encounter (Signed)
Patient sent a FAX and stated she was not going to take the [place]. She said she read the side effects and said she already had enough problems without adding to it.  She said she would just manage the tremors.  FYI

## 2021-02-14 NOTE — Telephone Encounter (Signed)
Done

## 2021-02-14 NOTE — Telephone Encounter (Signed)
Patient called with concerns about Risperdal.  She reported that she has had an increase in her cholesterol and her MD told her she was near having to go on a Statin drug. She does not want to do this. She stated that she had read Risperdal can increase Cholesterol and she is concerned about her TD symptoms as well.  She really would like to change meds and drop the Risperdal.  I have called [facility] for her labs.

## 2021-02-15 ENCOUNTER — Other Ambulatory Visit (HOSPITAL_COMMUNITY): Payer: Self-pay | Admitting: *Deleted

## 2021-03-06 ENCOUNTER — Telehealth (INDEPENDENT_AMBULATORY_CARE_PROVIDER_SITE_OTHER): Payer: Medicare PPO | Admitting: Psychiatry

## 2021-03-06 ENCOUNTER — Other Ambulatory Visit: Payer: Self-pay

## 2021-03-06 ENCOUNTER — Encounter (HOSPITAL_COMMUNITY): Payer: Self-pay | Admitting: Psychiatry

## 2021-03-06 DIAGNOSIS — F411 Generalized anxiety disorder: Secondary | ICD-10-CM | POA: Diagnosis not present

## 2021-03-06 DIAGNOSIS — F319 Bipolar disorder, unspecified: Secondary | ICD-10-CM

## 2021-03-06 MED ORDER — HYDROXYZINE HCL 10 MG PO TABS: ORAL_TABLET | ORAL | 0 refills | Status: DC

## 2021-03-06 MED ORDER — BUPROPION HCL ER (XL) 300 MG PO TB24: 300.0000 mg | ORAL_TABLET | Freq: Every day | ORAL | 0 refills | Status: DC

## 2021-03-06 MED ORDER — CLONAZEPAM 1 MG PO TABS: 1.0000 mg | ORAL_TABLET | Freq: Every day | ORAL | 2 refills | Status: DC

## 2021-03-06 MED ORDER — RISPERIDONE 1 MG PO TABS: 1.0000 mg | ORAL_TABLET | Freq: Every day | ORAL | 0 refills | Status: DC

## 2021-03-06 NOTE — Progress Notes (Signed)
Virtual Visit via Telephone Note  I connected with [name] on 03/06/21 at  2:00 PM EDT by telephone and verified that I am speaking with the correct person using two identifiers.  Location: Patient: Home Provider: Home office   I discussed the limitations, risks, security and privacy concerns of performing an evaluation and management service by telephone and the availability of in person appointments. I also discussed with the patient that there may be a patient responsible charge related to this service. The patient expressed understanding and agreed to proceed.   History of Present Illness: Patient is evaluated by phone session.  She had called our office few times concerning about her tremors.  She had a visit with the neurologist who told that she have parkinsonian which is related to psychotropic medication.  She is now taking Risperdal 1 mg only which we have told on the phone.  She noticed tremors are somewhat better but is still present.  She like to try different medication and I go over with other options.  She does not want the medication which is expensive or brand name.  She does not want medication that caused weight gain.  She does not want medication that cause memory issues.  After some discussion she agreed the Risperdal had to help her the most with her mood swings, paranoia, depression and hallucination.  Lately she noticed having nightmares and bad dreams which she is not sure what triggered.  She is taking melatonin but is helping her sleep but still have dreams.  Sometimes she is seeing dead people.  She does not want to go up on Risperdal because of the tremors.  We also talked about Cogentin but patient does not want any medication that cause worsening of attention and focus.  On the last visit she wanted to go up her Risperdal 2 mg but soon after she called back and went back to 1 mg.  Past Psychiatric History:Reviewed. H/Obipolar disorderandmultiple inpatient  treatment. Last inpatientinSeptember 2014 for suicidal thinking,hallucinationasplaying with guns and superficially cut her wrist with a needle. H/Omania and psychosis.Tried Zyprexa Seroquelbut stopped due toweight gain. Tegretol causedincreased paranoia confusion and required inpatient.   Psychiatric Specialty Exam: Physical Exam  Review of Systems  Weight 190 lb (86.2 kg).Body mass index is 32.61 kg/m.  General Appearance: NA  Eye Contact:  NA  Speech:  Slow  Volume:  Decreased  Mood:  Anxious  Affect:  NA  Thought Process:  Descriptions of Associations: Intact  Orientation:  Full (Time, Place, and Person)  Thought Content:  Rumination  Suicidal Thoughts:  No  Homicidal Thoughts:  No  Memory:  Immediate;   Good Recent;   Good Remote;   Fair  Judgement:  Fair  Insight:  Shallow  Psychomotor Activity:  Tremor  Concentration:  Concentration: Fair and Attention Span: Fair  Recall:  [employer] of Knowledge:  Good  Language:  Good  Akathisia:  No  Handed:  Right  AIMS (if indicated):     Assets:  Communication Skills Desire for Improvement Housing Social Support  ADL's:  Intact  Cognition:  Impaired,  Mild  Sleep:   fair      Assessment and Plan: Bipolar disorder type I.  Anxiety.  Mild cognitive impairment.  I reviewed notes from neurology and current medication.  I had a long discussion with the patient about medication side effects of psychotropic medication.  She does not want Latuda, Vraylar, Rexulti because they are expensive and she cannot  afford. She does not want Seroquel, olanzapine because of weight gain.  She had lost 3 pounds since Seroquel was discontinued.  We discussed most of psychotropic medication has side effects either they cause metabolic syndrome, tremors, weight gain or EPS.  After some discussion she agreed to keep the Risperdal 1 mg since she is tolerating well and her tremors are not as bad.  I recommend she can take extra hydroxyzine  10 mg at bedtime to help her sleep and dream.  Continue Klonopin 1 mg at bedtime, Wellbutrin XL 300 mg daily.  She is not taking Ingrezza.  She agreed to give [place] a call back if symptoms gets worse.  Patient is not interested in therapy.  Follow-up in 3 months.  Follow Up Instructions:    I discussed the assessment and treatment plan with the patient. The patient was provided an opportunity to ask questions and all were answered. The patient agreed with the plan and demonstrated an understanding of the instructions.   The patient was advised to call back or seek an in-person evaluation if the symptoms worsen or if the condition fails to improve as anticipated.  I provided 26 minutes of non-face-to-face time during this encounter.   [name], MD

## 2021-03-16 ENCOUNTER — Encounter (HOSPITAL_COMMUNITY): Payer: Self-pay | Admitting: *Deleted

## 2021-03-16 ENCOUNTER — Other Ambulatory Visit: Payer: Self-pay

## 2021-03-16 ENCOUNTER — Emergency Department (HOSPITAL_COMMUNITY)
Admission: EM | Admit: 2021-03-16 | Discharge: 2021-03-16 | Disposition: A | Discharge status: Home / Self Care | Payer: Medicare PPO | Attending: Emergency Medicine | Admitting: Emergency Medicine

## 2021-03-16 ENCOUNTER — Emergency Department (HOSPITAL_COMMUNITY): Payer: Medicare PPO

## 2021-03-16 DIAGNOSIS — Z79899 Other long term (current) drug therapy: Secondary | ICD-10-CM | POA: Diagnosis not present

## 2021-03-16 DIAGNOSIS — Z8669 Personal history of other diseases of the nervous system and sense organs: Secondary | ICD-10-CM | POA: Diagnosis not present

## 2021-03-16 DIAGNOSIS — R519 Headache, unspecified: Secondary | ICD-10-CM | POA: Insufficient documentation

## 2021-03-16 DIAGNOSIS — I10 Essential (primary) hypertension: Secondary | ICD-10-CM | POA: Insufficient documentation

## 2021-03-16 DIAGNOSIS — R110 Nausea: Secondary | ICD-10-CM | POA: Diagnosis not present

## 2021-03-16 MED ORDER — KETOROLAC TROMETHAMINE 30 MG/ML IJ SOLN
15.0000 mg | Freq: Once | INTRAMUSCULAR | Status: AC
  Administered 2021-03-16: 15 mg via INTRAVENOUS
  Filled 2021-03-16: qty 1

## 2021-03-16 MED ORDER — PROCHLORPERAZINE EDISYLATE 10 MG/2ML IJ SOLN
10.0000 mg | Freq: Once | INTRAMUSCULAR | Status: AC
  Administered 2021-03-16: 10 mg via INTRAVENOUS
  Filled 2021-03-16: qty 2

## 2021-03-16 MED ORDER — SODIUM CHLORIDE 0.9 % IV BOLUS
1000.0000 mL | Freq: Once | INTRAVENOUS | Status: AC
  Administered 2021-03-16: 1000 mL via INTRAVENOUS

## 2021-03-16 MED ORDER — DIPHENHYDRAMINE HCL 50 MG/ML IJ SOLN
25.0000 mg | Freq: Once | INTRAMUSCULAR | Status: AC
  Administered 2021-03-16: 25 mg via INTRAVENOUS
  Filled 2021-03-16: qty 1

## 2021-03-16 NOTE — Discharge Instructions (Signed)
As we discussed, please notify your neurologist that you are here.  Return emergency department for any worsening headache, vomiting, fevers or any other worsening concerning symptoms.

## 2021-03-16 NOTE — ED Provider Notes (Signed)
[facility] Provider Note   CSN: [id number] Arrival date & time: 03/16/21  1229     History No chief complaint on file.   [name] is a 60 y.o. female past med history of anxiety, bipolar, history of meningioma, migraine who presents for evaluation of headache.  She reports that she started having headache about 2 weeks ago.  She does not recall what exactly she was doing when it started.  She states that for the last 2 weeks, it has been intermittently occurring.  She will have some days where it feels better and then some days when it returns.  She states it is mostly in the left side of her head and goes from the front aspect to the posterior aspect.  She states that this is sometimes what her migraines do.  She does report that this headache does feel slightly different but has difficulty characterizing exactly why.  She states that occasionally she will get a sharp pain that she feels like goes from the right side to the left side.  She reports that headache is slightly better if she lays down and is quiet and still.  Gets worse if she sits up and moves around.  Today, she started having some nausea.  She has not any vomiting.  She denies any photosensitivity, vision changes, numbness/weakness, fevers.  She took Imitrex at home with minimal improvement, prompting ED visit. No preceding trauma, injury, fall.  The history is provided by the patient.       Past Medical History:  Diagnosis Date  . Anxiety   . Bipolar disorder ([place])   . Brain tumor (benign) ([place])   . Common migraine with intractable migraine 02/06/2021  . Depression   . HTN (hypertension)   . Mania ([place])   . Meningioma ([place])   . Migraine   . 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 Common migraine with intractable migraine 02/06/2021  . Tremor 02/06/2021  . DOE (dyspnea on exertion) 08/04/2019  . Chronic migraine 11/06/2016  . Hx of  resection of meningioma 10/07/2016  . Bipolar 1 disorder, depressed, moderate ([place]) 11/14/2012    Class: Chronic  . 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 ABDOMINAL HYSTERECTOMY    . BRAIN SURGERY    . breast tumor removal    . INCONTINENCE SURGERY    . INTERSTIM IMPLANT PLACEMENT    . KNEE SURGERY     right x 2  . PLANTAR FASCIECTOMY Left 03/05/16  . TUBAL LIGATION       OB History    Gravida  2   Para  2   Term  2   Preterm      AB      Living  2     SAB      IAB      Ectopic      Multiple      Live Births              Family History  Problem Relation Age of Onset  . Alcohol abuse Father   . Diabetes Father   . Mental retardation Father   . Drug abuse Brother   . Mental illness Mother   . Breast cancer Mother   . Dementia Neg Hx   .  ADD / ADHD Neg Hx   . Anxiety disorder Neg Hx   . Bipolar disorder Neg Hx   . Depression Neg Hx   . OCD Neg Hx   . Paranoid behavior Neg Hx   . Schizophrenia Neg Hx   . Seizures Neg Hx   . Sexual abuse Neg Hx   . Physical abuse Neg Hx   . Suicidality Neg Hx     Social History   Tobacco Use  . Smoking status: Never Smoker  . Smokeless tobacco: Never Used  Vaping Use  . Vaping Use: Never used  Substance Use Topics  . Alcohol use: No    Alcohol/week: 0.0 standard drinks  . Drug use: No    Home Medications Prior to Admission medications   Medication Sig Start Date End Date Taking? Authorizing Provider  amLODipine (NORVASC) 2.5 MG tablet  02/16/20   [provider]  aspirin-acetaminophen-caffeine (EXCEDRIN MIGRAINE) [phone] MG tablet Take 2 tablets by mouth every 6 (six) hours as needed for migraine.    [provider]  buPROPion (WELLBUTRIN XL) 300 MG 24 hr tablet Take 1 tablet (300 mg total) by mouth daily. TAKE ONE TABLET BY MOUTH EVERY [place]IN[place] FOR  DEPRESSION 03/06/21   Arfeen, Arlyce Harman, MD  clonazePAM (KLONOPIN) 1 MG tablet Take 1 tablet (1 mg total) by mouth at bedtime. 03/06/21   Arfeen, Arlyce Harman, MD  Erenumab-aooe (AIMOVIG) 140 MG/ML SOAJ Inject 140 mg into the skin every 30 (thirty) days. 10/03/20   Kathrynn Ducking, MD  hydrOXYzine (ATARAX/VISTARIL) 10 MG tablet Take one to two tab as needed at bedtime. 03/06/21   Arfeen, Arlyce Harman, MD  ibuprofen (ADVIL,MOTRIN) 800 MG tablet Take 1 tablet (800 mg total) by mouth 3 (three) times daily. 12/16/17   Noemi Chapel, MD  metoprolol tartrate (LOPRESSOR) 25 MG tablet Take 12.5 mg by mouth daily.    [provider]  risperiDONE (RISPERDAL) 1 MG tablet Take 1 tablet (1 mg total) by mouth at bedtime. FOR MOOD CONTROL 03/06/21   Arfeen, Arlyce Harman, MD  SUMAtriptan (IMITREX) 50 MG tablet Take 1 tablet (50 mg total) by mouth every 2 (two) hours as needed for migraine. May repeat in 2 hours if headache persists or recurs. 10/23/20   Marcial Pacas, MD    Allergies    Gabapentin, Oxcarbazepine, Tegretol [carbamazepine], Codeine, Divalproex sodium, Erythromycin, Phenytoin sodium extended, and Phenytoin sodium extended  Review of Systems   Review of Systems  Respiratory: Negative for shortness of breath.   Cardiovascular: Negative for chest pain.  Gastrointestinal: Positive for nausea. Negative for abdominal pain and vomiting.  Neurological: Positive for headaches. Negative for weakness and numbness.  All other systems reviewed and are negative.   Physical Exam Updated Vital Signs BP 122/84 (BP Location: Left Arm)   Pulse (!) 54   Temp 98.4 F (36.9 C) (Oral)   Resp 16   Ht 5\' 4"  (1.626 m)   Wt 86.2 kg   SpO2 99%   BMI 32.61 kg/m   Physical Exam Vitals and nursing note reviewed.  Constitutional:      Appearance: Normal appearance. She is well-developed.  HENT:     Head: Normocephalic and atraumatic.     Comments: No tenderness to palpation of skull. No deformities or crepitus noted. No open  wounds, abrasions or lacerations.  Eyes:     General: Lids are normal.     Conjunctiva/sclera: Conjunctivae normal.     Pupils: Pupils are equal, round, and reactive to  light.     Comments: PERRL. EOMs intact. No nystagmus. No neglect.   Neck:     Vascular: No carotid bruit.     Comments: Neck is supple and without rigidity. FROM without any difficulty.  Cardiovascular:     Rate and Rhythm: Normal rate and regular rhythm.     Pulses: Normal pulses.     Heart sounds: Normal heart sounds. No murmur heard. No friction rub. No gallop.   Pulmonary:     Effort: Pulmonary effort is normal.     Breath sounds: Normal breath sounds.  Abdominal:     Palpations: Abdomen is soft. Abdomen is not rigid.     Tenderness: There is no abdominal tenderness. There is no guarding.  Musculoskeletal:        General: Normal range of motion.     Cervical back: Full passive range of motion without pain.  Skin:    General: Skin is warm and dry.     Capillary Refill: Capillary refill takes less than 2 seconds.  Neurological:     Mental Status: She is alert and oriented to person, place, and time.     Comments: Cranial nerves III-XII intact Follows commands, Moves all extremities  5/5 strength to BUE and BLE  Sensation intact throughout all major nerve distributions No gait abnormalities  No slurred speech. No facial droop.   Psychiatric:        Speech: Speech normal.     ED Results / Procedures / Treatments   Labs (all labs ordered are listed, but only abnormal results are displayed) Labs Reviewed - No data to display  EKG None  Radiology CT Head Wo Contrast  Result Date: 03/16/2021 CLINICAL DATA:  Headache, new or worsening. Additional provided: Patient reports migraine for 2 weeks with nausea, history of brain tumor and brain surgery in 2000. History of migraines. EXAM: CT HEAD WITHOUT CONTRAST TECHNIQUE: Contiguous axial images were obtained from the base of the skull through the vertex  without intravenous contrast. COMPARISON:  Prior brain MRI examinations 10/12/2020 and earlier. Prior head CT examinations 12/16/2017 and earlier. FINDINGS: Brain: Cerebral volume is normal for age. There is no acute intracranial hemorrhage. No demarcated cortical infarct. No extra-axial fluid collection. No evidence of intracranial mass. No midline shift. Vascular: No hyperdense vessel.  Atherosclerotic calcifications. Skull: No calvarial fracture or focal suspicious osseous lesion. Redemonstrated sequela of prior left sided craniotomy/cranioplasty. Sinuses/Orbits: Visualized orbits show no acute finding. No significant paranasal sinus disease at the imaged levels. IMPRESSION: No evidence of acute intracranial abnormality. Stable postoperative changes from prior left craniotomy and meningioma resection. Electronically Signed   By: Kellie Simmering DO   On: 03/16/2021 14:51    Procedures Procedures   Medications Ordered in ED Medications  sodium chloride 0.9 % bolus 1,000 mL (0 mLs Intravenous Stopped 03/16/21 1500)  prochlorperazine (COMPAZINE) injection 10 mg (10 mg Intravenous Given 03/16/21 1333)  diphenhydrAMINE (BENADRYL) injection 25 mg (25 mg Intravenous Given 03/16/21 1331)  ketorolac (TORADOL) 30 MG/ML injection 15 mg (15 mg Intravenous Given 03/16/21 1556)    ED Course  I have reviewed the triage vital signs and the nursing notes.  Pertinent labs & imaging results that were available during my care of the patient were reviewed by me and considered in my medical decision making (see chart for details).    MDM Rules/Calculators/A&P  60 y.o. F past medical history migraine, bipolar, anxiety who presents for evaluation of headache that has been ongoing for about 2 weeks.  States it has been intermittently occurring.  She has had some nausea.  She states that the headache does go from the left side front to the left side back.  She denies any preceding trauma, injury,  fall.  No fevers.  She states that the pain from the left front to the left back is similar to her migraines but states that there is a component of this that feels slightly different than her migraines.  She has difficulty explaining why she feels like this feels different.  On initial arrival, she is afebrile, nontoxic-appearing.  Vital signs are stable.  No neuro deficits noted on exam.  History/physical exam is not concerning for meningitis.  Doubt intracranial hemorrhage given lack of trauma, injury.  Patient does have a history of meningioma and does state that her headache is slightly worsened with position as well as some nausea.  Given her history of meningioma and that she feels like this is slightly worse than her normal migraines, we will obtain imaging and give migraine cocktail.  She does tell me that when she gets migraine headaches, they typically are on the left side of her head and go from the front to the back.  She has difficulty verbalizing why she feels like this is slightly different than her migraines but states that there is a component that feels similar.  On my exam, her exam is reassuring.  Will give migraine cocktail and reassess.  CT head negative for any acute abnormality.   Reevaluation after migraine cocktail.  Patient reports headache has resolved.  She reports feeling better.  I discussed with her regarding her CT head findings.  I suspect this was a breakthrough migraine.  I instructed patient to follow-up with her neurologist. At this time, patient exhibits no emergent life-threatening condition that require further evaluation in ED. Patient had ample opportunity for questions and discussion. All patient's questions were answered with full understanding. Strict return precautions discussed. Patient expresses understanding and agreement to plan.   Portions of this note were generated with [employer]. Dictation errors may occur despite best attempts at  proofreading.  Final Clinical Impression(s) / ED Diagnoses Final diagnoses:  Nonintractable headache, unspecified chronicity pattern, unspecified headache type    Rx / DC Orders ED Discharge Orders    None       Volanda Napoleon, PA-C 03/16/21 1656    Fredia Sorrow, MD 03/17/21 [phone]

## 2021-03-16 NOTE — ED Notes (Signed)
Pt returned from CT °

## 2021-03-16 NOTE — ED Triage Notes (Signed)
Headache for 2 weeks, nausea this am

## 2021-03-16 NOTE — ED Notes (Addendum)
Pt A&Ox3. RR even and nonlabored. Reports pmh of migraines, brain tumor, sx. Does see a neurologist, last appointment was in November. Has had a migraine for the past two weeks. This morning took Imitrex without relief. Similar symptoms today as her prior migraines. No neuro deficits noted. Denies n/v. Updated on plan of care and she expresses understanding. Husband at ER who can drive her home.

## 2021-03-16 NOTE — ED Notes (Signed)
Pt taken for CT 

## 2021-03-22 ENCOUNTER — Telehealth: Payer: Self-pay | Admitting: Neurology

## 2021-03-22 MED ORDER — PREDNISONE 5 MG PO TABS: ORAL_TABLET | ORAL | 0 refills | Status: DC

## 2021-03-22 NOTE — Addendum Note (Signed)
Addended by: Kathrynn Ducking on: 03/22/2021 05:14 PM   Modules accepted: Orders

## 2021-03-22 NOTE — Telephone Encounter (Signed)
Pt having break thru headaches and would like a call to discuss.  Pt taking Erenumab-aooe (AIMOVIG) 140 MG/ML SOAJ shots and still headaches are breaking thru

## 2021-03-22 NOTE — Telephone Encounter (Signed)
I called the patient.  The patient has had almost no headaches on Aimovig, but over the last 3 weeks the headaches have come back on suddenly and have been almost daily.  She went to the emergency room on 25 March, her headaches are now every other day.  I will give a course of prednisone to see if we can improve the headaches, if this is not effective, I will add Ubrelvy to the regimen.

## 2021-04-02 ENCOUNTER — Telehealth: Payer: Self-pay | Admitting: Neurology

## 2021-04-02 MED ORDER — QULIPTA 30 MG PO TABS: 30.0000 mg | ORAL_TABLET | Freq: Every day | ORAL | 3 refills | Status: DC

## 2021-04-02 NOTE — Telephone Encounter (Signed)
Talk with the patient.  The patient is now having daily headaches, the prednisone helped transiently.  We will stop the Aimovig and switch over to [facility] for headache management.

## 2021-04-02 NOTE — Telephone Encounter (Signed)
Pt called wanting to know if she is to be taking Erenumab-aooe (AIMOVIG) 140 MG/ML SOAJ or the Qulipta. Please advise.

## 2021-04-04 ENCOUNTER — Other Ambulatory Visit: Payer: Self-pay | Admitting: Neurology

## 2021-04-04 NOTE — Telephone Encounter (Signed)
Pt called wanting to know if the [facility] will be getting PA.

## 2021-04-09 NOTE — Telephone Encounter (Signed)
Awaiting determination on Qulipta PA at this time.

## 2021-04-09 NOTE — Telephone Encounter (Signed)
PA for [facility] faxed to [facility]. Awaiting determination.

## 2021-04-19 ENCOUNTER — Emergency Department (HOSPITAL_COMMUNITY)
Admission: EM | Admit: 2021-04-19 | Discharge: 2021-04-19 | Disposition: A | Discharge status: Home / Self Care | Payer: Medicare PPO | Attending: Emergency Medicine | Admitting: Emergency Medicine

## 2021-04-19 ENCOUNTER — Other Ambulatory Visit: Payer: Self-pay

## 2021-04-19 ENCOUNTER — Encounter (HOSPITAL_COMMUNITY): Payer: Self-pay | Admitting: Emergency Medicine

## 2021-04-19 DIAGNOSIS — Z79899 Other long term (current) drug therapy: Secondary | ICD-10-CM | POA: Diagnosis not present

## 2021-04-19 DIAGNOSIS — Z7982 Long term (current) use of aspirin: Secondary | ICD-10-CM | POA: Insufficient documentation

## 2021-04-19 DIAGNOSIS — I10 Essential (primary) hypertension: Secondary | ICD-10-CM | POA: Diagnosis not present

## 2021-04-19 DIAGNOSIS — G43909 Migraine, unspecified, not intractable, without status migrainosus: Secondary | ICD-10-CM | POA: Insufficient documentation

## 2021-04-19 MED ORDER — PROCHLORPERAZINE EDISYLATE 10 MG/2ML IJ SOLN
10.0000 mg | Freq: Once | INTRAMUSCULAR | Status: AC
  Administered 2021-04-19: 10 mg via INTRAVENOUS
  Filled 2021-04-19: qty 2

## 2021-04-19 MED ORDER — SODIUM CHLORIDE 0.9 % IV BOLUS
1000.0000 mL | Freq: Once | INTRAVENOUS | Status: AC
  Administered 2021-04-19: 1000 mL via INTRAVENOUS

## 2021-04-19 MED ORDER — DIPHENHYDRAMINE HCL 50 MG/ML IJ SOLN
25.0000 mg | Freq: Once | INTRAMUSCULAR | Status: AC
  Administered 2021-04-19: 25 mg via INTRAVENOUS
  Filled 2021-04-19: qty 1

## 2021-04-19 MED ORDER — PROCHLORPERAZINE MALEATE 10 MG PO TABS: 10.0000 mg | ORAL_TABLET | Freq: Two times a day (BID) | ORAL | 0 refills | Status: AC | PRN

## 2021-04-19 MED ORDER — DEXAMETHASONE SODIUM PHOSPHATE 10 MG/ML IJ SOLN
10.0000 mg | Freq: Once | INTRAMUSCULAR | Status: AC
  Administered 2021-04-19: 10 mg via INTRAVENOUS
  Filled 2021-04-19: qty 1

## 2021-04-19 MED ORDER — KETOROLAC TROMETHAMINE 30 MG/ML IJ SOLN
30.0000 mg | Freq: Once | INTRAMUSCULAR | Status: AC
  Administered 2021-04-19: 30 mg via INTRAVENOUS
  Filled 2021-04-19: qty 1

## 2021-04-19 NOTE — ED Provider Notes (Signed)
[facility] EMERGENCY DEPARTMENT Provider Note   CSN: [id number] Arrival date & time: 04/19/21  0754     History Chief Complaint  Patient presents with  . Migraine    [name] is a 60 y.o. female.  HPI Patient presents with migraine.  History of same.  Throbbing in head.  Began last night.  No relief with triptan or Excedrin.  Has some nausea and some photophobia.  Typical headache for her.  Has about daily headaches.  This 1 worse and unrelieved.  No trauma.  Sees neurology for management.  No fevers or chills.  Has a reported meningioma.  States she had previous surgery but there is another one in there.    Past Medical History:  Diagnosis Date  . Anxiety   . Bipolar disorder ([place])   . Brain tumor (benign) ([place])   . Common migraine with intractable migraine 02/06/2021  . Depression   . HTN (hypertension)   . Mania ([place])   . Meningioma ([place])   . Migraine   . 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 Common migraine with intractable migraine 02/06/2021  . Tremor 02/06/2021  . DOE (dyspnea on exertion) 08/04/2019  . Chronic migraine 11/06/2016  . Hx of resection of meningioma 10/07/2016  . Bipolar 1 disorder, depressed, moderate ([place]) 11/14/2012    Class: Chronic  . 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 ABDOMINAL HYSTERECTOMY    . BRAIN SURGERY    . breast tumor removal    . INCONTINENCE SURGERY    . INTERSTIM IMPLANT PLACEMENT    . KNEE SURGERY     right x 2  . PLANTAR FASCIECTOMY Left 03/05/16  . TUBAL LIGATION       OB History    Gravida  2   Para  2   Term  2   Preterm      AB      Living  2     SAB      IAB      Ectopic      Multiple      Live Births              Family History  Problem Relation Age of Onset  . Alcohol abuse Father   .  Diabetes Father   . Mental retardation Father   . Drug abuse Brother   . Mental illness Mother   . Breast cancer Mother   . Dementia Neg Hx   . ADD / ADHD Neg Hx   . Anxiety disorder Neg Hx   . Bipolar disorder Neg Hx   . Depression Neg Hx   . OCD Neg Hx   . Paranoid behavior Neg Hx   . Schizophrenia Neg Hx   . Seizures Neg Hx   . Sexual abuse Neg Hx   . Physical abuse Neg Hx   . Suicidality Neg Hx     Social History   Tobacco Use  . Smoking status: Never Smoker  . Smokeless tobacco: Never Used  Vaping Use  . Vaping Use: Never used  Substance Use Topics  . Alcohol use: No    Alcohol/week: 0.0 standard drinks  . Drug use: No    Home Medications Prior to Admission medications   Medication Sig Start Date End  Date Taking? Authorizing Provider  amLODipine (NORVASC) 2.5 MG tablet Take 2.5 mg by mouth daily. 02/16/20  Yes [provider]  aspirin-acetaminophen-caffeine (EXCEDRIN MIGRAINE) [phone] MG tablet Take 2 tablets by mouth every 6 (six) hours as needed for migraine.   Yes [provider]  buPROPion (WELLBUTRIN XL) 300 MG 24 hr tablet Take 1 tablet (300 mg total) by mouth daily. TAKE ONE TABLET BY MOUTH EVERY [place] FOR DEPRESSION Patient taking differently: Take 300 mg by mouth daily. 03/06/21  Yes Arfeen, Arlyce Harman, MD  clonazePAM (KLONOPIN) 1 MG tablet Take 1 tablet (1 mg total) by mouth at bedtime. 03/06/21  Yes Arfeen, Arlyce Harman, MD  hydrOXYzine (ATARAX/VISTARIL) 10 MG tablet Take one to two tab as needed at bedtime. Patient taking differently: Take 10-20 mg by mouth at bedtime. 03/06/21  Yes Arfeen, Arlyce Harman, MD  metoprolol succinate (TOPROL-XL) 25 MG 24 hr tablet Take 50 mg by mouth in the morning and at bedtime. 2 25mg  in the am, 2 25mg  at night   Yes [provider]  prochlorperazine (COMPAZINE) 10 MG tablet Take 1 tablet (10 mg total) by mouth 2 (two) times daily as needed for nausea or vomiting. 04/19/21  Yes Davonna Belling, MD  risperiDONE  (RISPERDAL) 1 MG tablet Take 1 tablet (1 mg total) by mouth at bedtime. FOR MOOD CONTROL Patient taking differently: Take 1 mg by mouth at bedtime. 03/06/21  Yes Arfeen, Arlyce Harman, MD  SUMAtriptan (IMITREX) 50 MG tablet Take 1 tablet (50 mg total) by mouth every 2 (two) hours as needed for migraine. May repeat in 2 hours if headache persists or recurs. 10/23/20  Yes Marcial Pacas, MD  Atogepant (QULIPTA) 30 MG TABS Take 30 mg by mouth daily. Patient not taking: No sig reported 04/02/21   Kathrynn Ducking, MD  Erenumab-aooe (AIMOVIG) 140 MG/ML SOAJ Inject 140 mg into the skin every 30 (thirty) days. Patient not taking: No sig reported 10/03/20   Kathrynn Ducking, MD  ibuprofen (ADVIL,MOTRIN) 800 MG tablet Take 1 tablet (800 mg total) by mouth 3 (three) times daily. Patient not taking: Reported on 04/19/2021 12/16/17   Noemi Chapel, MD  predniSONE (DELTASONE) 5 MG tablet Begin taking 6 tablets daily, taper by one tablet daily until off the medication. Patient not taking: No sig reported 03/22/21   Kathrynn Ducking, MD    Allergies    Gabapentin, Oxcarbazepine, Tegretol [carbamazepine], Codeine, Divalproex sodium, Erythromycin, Phenytoin sodium extended, and Phenytoin sodium extended  Review of Systems   Review of Systems  Constitutional: Negative for appetite change and fever.  HENT: Negative for congestion.   Eyes: Positive for photophobia.  Respiratory: Negative for shortness of breath.   Cardiovascular: Negative for chest pain.  Gastrointestinal: Positive for nausea and vomiting.  Genitourinary: Negative for flank pain.  Musculoskeletal: Negative for back pain.  Skin: Negative for rash.  Neurological: Positive for headaches.  Psychiatric/Behavioral: Negative for confusion.    Physical Exam Updated Vital Signs BP 111/79 (BP Location: Left Arm)   Pulse (!) 54   Temp 97.6 F (36.4 C) (Oral)   Resp 18   Ht 5\' 4"  (1.626 m)   Wt 86.2 kg   SpO2 100%   BMI 32.61 kg/m   Physical  Exam Vitals and nursing note reviewed.  HENT:     Head: Atraumatic.     Mouth/Throat:     Mouth: Mucous membranes are moist.  Eyes:     Pupils: Pupils are equal, round, and reactive to light.  Cardiovascular:     Rate and Rhythm: Regular rhythm.  Pulmonary:     Breath sounds: No wheezing or rhonchi.  Abdominal:     Tenderness: There is no abdominal tenderness.  Musculoskeletal:        General: No tenderness.     Cervical back: Neck supple.  Skin:    General: Skin is warm.     Capillary Refill: Capillary refill takes less than 2 seconds.  Neurological:     Mental Status: She is alert and oriented to person, place, and time. Mental status is at baseline.     ED Results / Procedures / Treatments   Labs (all labs ordered are listed, but only abnormal results are displayed) Labs Reviewed - No data to display  EKG None  Radiology No results found.  Procedures Procedures   Medications Ordered in ED Medications  sodium chloride 0.9 % bolus 1,000 mL (0 mLs Intravenous Stopped 04/19/21 1025)  diphenhydrAMINE (BENADRYL) injection 25 mg (25 mg Intravenous Given 04/19/21 0912)  dexamethasone (DECADRON) injection 10 mg (10 mg Intravenous Given 04/19/21 0907)  prochlorperazine (COMPAZINE) injection 10 mg (10 mg Intravenous Given 04/19/21 0909)  ketorolac (TORADOL) 30 MG/ML injection 30 mg (30 mg Intravenous Given 04/19/21 2505)    ED Course  I have reviewed the triage vital signs and the nursing notes.  Pertinent labs & imaging results that were available during my care of the patient were reviewed by me and considered in my medical decision making (see chart for details).    MDM Rules/Calculators/A&P                          Patient with migraine headache.  History of same.  Pounding.  Has had improvement after treatment with migraine cocktail.  Given Benadryl Decadron Compazine and Toradol.  Feels better.  Discharge home.  Requesting antiemetic and will give Compazine since it  helped here.  Discharge home with neurology follow-up.   Final Clinical Impression(s) / ED Diagnoses Final diagnoses:  Migraine without status migrainosus, not intractable, unspecified migraine type    Rx / DC Orders ED Discharge Orders         Ordered    prochlorperazine (COMPAZINE) 10 MG tablet  2 times daily PRN        04/19/21 1035           Davonna Belling, MD 04/19/21 1541

## 2021-04-19 NOTE — ED Triage Notes (Signed)
Pt c/o of a migraine with nausea since last night. Pt states she has HX of migraines and takes meds but no relief

## 2021-04-19 NOTE — ED Notes (Signed)
Pt reports pain relieved.  Requesting prescription for nausea medication when discharged.  Will notify EDP.

## 2021-04-23 ENCOUNTER — Other Ambulatory Visit: Payer: Self-pay | Admitting: Neurology

## 2021-04-23 MED ORDER — AJOVY 225 MG/1.5ML ~~LOC~~ SOAJ: 225.0000 mg | SUBCUTANEOUS | 4 refills | Status: DC

## 2021-04-23 NOTE — Addendum Note (Signed)
Addended by: Kathrynn Ducking on: 04/23/2021 06:45 PM   Modules accepted: Orders

## 2021-04-23 NOTE — Telephone Encounter (Signed)
I called the patient.  The insurance did not approve the [place], we will try another medication such as Ajovy to see if this can get approved.

## 2021-04-25 ENCOUNTER — Telehealth: Payer: Self-pay | Admitting: *Deleted

## 2021-04-25 NOTE — Telephone Encounter (Signed)
Completed Emgality PA on Cover My Meds. Key: [id number]. Awaiting determination from [facility].

## 2021-04-25 NOTE — Telephone Encounter (Signed)
PA Case: [id number], Status: Approved, Coverage Starts on: 04/25/2021 12:00:00 AM, Coverage Ends on: 07/24/2021 12:00:00 AM. Questions? Contact [phone].  Pt aware it has been approved. Advised her to contact pharmacy.

## 2021-05-02 ENCOUNTER — Telehealth (HOSPITAL_COMMUNITY): Payer: Self-pay | Admitting: *Deleted

## 2021-05-02 ENCOUNTER — Other Ambulatory Visit (HOSPITAL_COMMUNITY): Payer: Self-pay | Admitting: *Deleted

## 2021-05-02 DIAGNOSIS — F319 Bipolar disorder, unspecified: Secondary | ICD-10-CM

## 2021-05-02 MED ORDER — CLONAZEPAM 1 MG PO TABS: 1.0000 mg | ORAL_TABLET | Freq: Every day | ORAL | 0 refills | Status: DC

## 2021-05-02 NOTE — Telephone Encounter (Signed)
Done

## 2021-05-02 NOTE — Telephone Encounter (Signed)
Yes, please provide 2-week supply of Klonopin until he gets medication from his mail pharmacy.

## 2021-05-02 NOTE — Telephone Encounter (Signed)
[facility] called and stated that patients K[place]in has been delayed from the mail order pharmacy and patient will not receive it for 2 weeks.  Pharmacy asked if a bridge could be sent to [facility] [place] to hold patient over.

## 2021-05-10 ENCOUNTER — Encounter (HOSPITAL_COMMUNITY): Payer: Self-pay

## 2021-05-10 ENCOUNTER — Other Ambulatory Visit: Payer: Self-pay

## 2021-05-10 ENCOUNTER — Emergency Department (HOSPITAL_COMMUNITY)
Admission: EM | Admit: 2021-05-10 | Discharge: 2021-05-10 | Disposition: A | Discharge status: Home / Self Care | Payer: Medicare PPO | Attending: Emergency Medicine | Admitting: Emergency Medicine

## 2021-05-10 DIAGNOSIS — Z79899 Other long term (current) drug therapy: Secondary | ICD-10-CM | POA: Insufficient documentation

## 2021-05-10 DIAGNOSIS — G43909 Migraine, unspecified, not intractable, without status migrainosus: Secondary | ICD-10-CM | POA: Diagnosis not present

## 2021-05-10 DIAGNOSIS — I10 Essential (primary) hypertension: Secondary | ICD-10-CM | POA: Diagnosis not present

## 2021-05-10 DIAGNOSIS — R519 Headache, unspecified: Secondary | ICD-10-CM | POA: Diagnosis present

## 2021-05-10 MED ORDER — DEXAMETHASONE SODIUM PHOSPHATE 10 MG/ML IJ SOLN
10.0000 mg | Freq: Once | INTRAMUSCULAR | Status: AC
  Administered 2021-05-10: 10 mg via INTRAVENOUS
  Filled 2021-05-10: qty 1

## 2021-05-10 MED ORDER — KETOROLAC TROMETHAMINE 30 MG/ML IJ SOLN
30.0000 mg | Freq: Once | INTRAMUSCULAR | Status: AC
  Administered 2021-05-10: 30 mg via INTRAVENOUS
  Filled 2021-05-10: qty 1

## 2021-05-10 MED ORDER — PROCHLORPERAZINE EDISYLATE 10 MG/2ML IJ SOLN
10.0000 mg | Freq: Once | INTRAMUSCULAR | Status: AC
  Administered 2021-05-10: 10 mg via INTRAVENOUS
  Filled 2021-05-10: qty 2

## 2021-05-10 MED ORDER — DIPHENHYDRAMINE HCL 50 MG/ML IJ SOLN
25.0000 mg | Freq: Once | INTRAMUSCULAR | Status: AC
  Administered 2021-05-10: 25 mg via INTRAVENOUS
  Filled 2021-05-10: qty 1

## 2021-05-10 MED ORDER — SODIUM CHLORIDE 0.9 % IV BOLUS
1000.0000 mL | Freq: Once | INTRAVENOUS | Status: AC
  Administered 2021-05-10: 1000 mL via INTRAVENOUS

## 2021-05-10 NOTE — Discharge Instructions (Signed)
The home and rest today.  Follow-up with your doctor and neurologist.  As needed.  Return for any new or worse symptoms

## 2021-05-10 NOTE — ED Triage Notes (Signed)
Woke 4 AM with a migraine that she took medication for but it doesn't work per pt.  Pt alert and oriented.  Skin warm and dry.

## 2021-05-10 NOTE — ED Provider Notes (Signed)
[facility] EMERGENCY DEPARTMENT Provider Note   CSN: [id number] Arrival date & time: 05/10/21  4098     History Chief Complaint  Patient presents with  . Migraine    SHERLONDA FLATER is a 60 y.o. female.  Patient awoke this morning at 0 400 with left forehead temporal area headache consistent with migraine associated with nausea.  No vomiting.  No photophobia no visual changes no fevers no neck stiffness.  No upper extremity or lower extremity weakness or numbness.  Patient states that this is typical of migraine.  Last seen by [place] on April 28.  Patient received migraine cocktail at that time and felt much better.  She does have a neurologist to follow-up with.        Past Medical History:  Diagnosis Date  . Anxiety   . Bipolar disorder ([place])   . Brain tumor (benign) ([place])   . Common migraine with intractable migraine 02/06/2021  . Depression   . HTN (hypertension)   . Mania ([place])   . Meningioma ([place])   . Migraine   . 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 Common migraine with intractable migraine 02/06/2021  . Tremor 02/06/2021  . DOE (dyspnea on exertion) 08/04/2019  . Chronic migraine 11/06/2016  . Hx of resection of meningioma 10/07/2016  . Bipolar 1 disorder, depressed, moderate ([place]) 11/14/2012    Class: Chronic  . 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 ABDOMINAL HYSTERECTOMY    . BRAIN SURGERY    . breast tumor removal    . INCONTINENCE SURGERY    . INTERSTIM IMPLANT PLACEMENT    . KNEE SURGERY     right x 2  . PLANTAR FASCIECTOMY Left 03/05/16  . TUBAL LIGATION       OB History    Gravida  2   Para  2   Term  2   Preterm      AB      Living  2     SAB      IAB      Ectopic      Multiple      Live Births              Family History  Problem  Relation Age of Onset  . Alcohol abuse Father   . Diabetes Father   . Mental retardation Father   . Drug abuse Brother   . Mental illness Mother   . Breast cancer Mother   . Dementia Neg Hx   . ADD / ADHD Neg Hx   . Anxiety disorder Neg Hx   . Bipolar disorder Neg Hx   . Depression Neg Hx   . OCD Neg Hx   . Paranoid behavior Neg Hx   . Schizophrenia Neg Hx   . Seizures Neg Hx   . Sexual abuse Neg Hx   . Physical abuse Neg Hx   . Suicidality Neg Hx     Social History   Tobacco Use  . Smoking status: Never Smoker  . Smokeless tobacco: Never Used  Vaping Use  . Vaping Use: Never used  Substance Use Topics  . Alcohol use: No    Alcohol/week: 0.0 standard drinks  . Drug use: No    Home Medications Prior to Admission medications  Medication Sig Start Date End Date Taking? Authorizing Provider  amLODipine (NORVASC) 2.5 MG tablet Take 2.5 mg by mouth daily. 02/16/20  Yes [provider]  aspirin-acetaminophen-caffeine (EXCEDRIN MIGRAINE) [phone] MG tablet Take 2 tablets by mouth every 6 (six) hours as needed for migraine.   Yes [provider]  buPROPion (WELLBUTRIN XL) 300 MG 24 hr tablet Take 1 tablet (300 mg total) by mouth daily. TAKE ONE TABLET BY MOUTH EVERY [place] FOR DEPRESSION Patient taking differently: Take 300 mg by mouth daily. 03/06/21  Yes Arfeen, Arlyce Harman, MD  clonazePAM (KLONOPIN) 1 MG tablet Take 1 tablet (1 mg total) by mouth at bedtime. 05/02/21  Yes Arfeen, Arlyce Harman, MD  hydrOXYzine (ATARAX/VISTARIL) 10 MG tablet Take one to two tab as needed at bedtime. Patient taking differently: Take 10-20 mg by mouth at bedtime. 03/06/21  Yes Arfeen, Arlyce Harman, MD  metoprolol succinate (TOPROL-XL) 50 MG 24 hr tablet Take 100 mg by mouth daily. 04/18/21  Yes [provider]  prochlorperazine (COMPAZINE) 10 MG tablet Take 1 tablet (10 mg total) by mouth 2 (two) times daily as needed for nausea or vomiting. 04/19/21  Yes Davonna Belling, MD   risperiDONE (RISPERDAL) 1 MG tablet Take 1 tablet (1 mg total) by mouth at bedtime. FOR MOOD CONTROL Patient taking differently: Take 1 mg by mouth at bedtime. 03/06/21  Yes Arfeen, Arlyce Harman, MD  SUMAtriptan (IMITREX) 50 MG tablet Take 1 tablet (50 mg total) by mouth every 2 (two) hours as needed for migraine. May repeat in 2 hours if headache persists or recurs. 10/23/20  Yes Marcial Pacas, MD  Galcanezumab-gnlm [facility]) 120 MG/ML SOAJ Inject 120 mg into the skin every 30 (thirty) days. Patient not taking: No sig reported 04/25/21   Suzzanne Cloud, NP  ibuprofen (ADVIL,MOTRIN) 800 MG tablet Take 1 tablet (800 mg total) by mouth 3 (three) times daily. Patient not taking: No sig reported 12/16/17   Noemi Chapel, MD  predniSONE (DELTASONE) 5 MG tablet Begin taking 6 tablets daily, taper by one tablet daily until off the medication. Patient not taking: No sig reported 03/22/21   Kathrynn Ducking, MD    Allergies    Gabapentin, Oxcarbazepine, Tegretol [carbamazepine], Codeine, Divalproex sodium, Erythromycin, Phenytoin sodium extended, and Phenytoin sodium extended  Review of Systems   Review of Systems  Constitutional: Negative for chills and fever.  HENT: Negative for rhinorrhea and sore throat.   Eyes: Negative for visual disturbance.  Respiratory: Negative for cough and shortness of breath.   Cardiovascular: Negative for chest pain and leg swelling.  Gastrointestinal: Positive for nausea. Negative for abdominal pain, diarrhea and vomiting.  Genitourinary: Negative for dysuria.  Musculoskeletal: Negative for back pain and neck pain.  Skin: Negative for rash.  Neurological: Positive for headaches. Negative for dizziness and light-headedness.  Hematological: Does not bruise/bleed easily.  Psychiatric/Behavioral: Negative for confusion.    Physical Exam Updated Vital Signs BP 106/86   Pulse 78   Temp 97.8 F (36.6 C) (Oral)   Resp 18   Ht 1.626 m (5\' 4" )   Wt 87 kg   SpO2 100%   BMI  32.92 kg/m   Physical Exam Vitals and nursing note reviewed.  Constitutional:      General: She is not in acute distress.    Appearance: Normal appearance. She is well-developed.  HENT:     Head: Normocephalic and atraumatic.  Eyes:     Extraocular Movements: Extraocular movements intact.     Conjunctiva/sclera: Conjunctivae  normal.     Pupils: Pupils are equal, round, and reactive to light.  Cardiovascular:     Rate and Rhythm: Normal rate and regular rhythm.     Heart sounds: No murmur heard.   Pulmonary:     Effort: Pulmonary effort is normal. No respiratory distress.     Breath sounds: Normal breath sounds.  Abdominal:     Palpations: Abdomen is soft.     Tenderness: There is no abdominal tenderness.  Musculoskeletal:        General: Normal range of motion.     Cervical back: Normal range of motion and neck supple. No rigidity.  Skin:    General: Skin is warm and dry.     Capillary Refill: Capillary refill takes less than 2 seconds.  Neurological:     General: No focal deficit present.     Mental Status: She is alert and oriented to person, place, and time.     Cranial Nerves: No cranial nerve deficit.     Sensory: No sensory deficit.     Motor: No weakness.     Coordination: Coordination normal.     ED Results / Procedures / Treatments   Labs (all labs ordered are listed, but only abnormal results are displayed) Labs Reviewed - No data to display  EKG None  Radiology No results found.  Procedures Procedures   Medications Ordered in ED Medications  sodium chloride 0.9 % bolus 1,000 mL (1,000 mLs Intravenous New Bag/Given 05/10/21 0831)  dexamethasone (DECADRON) injection 10 mg (10 mg Intravenous Given 05/10/21 0835)  diphenhydrAMINE (BENADRYL) injection 25 mg (25 mg Intravenous Given 05/10/21 0833)  prochlorperazine (COMPAZINE) injection 10 mg (10 mg Intravenous Given 05/10/21 0834)  ketorolac (TORADOL) 30 MG/ML injection 30 mg (30 mg Intravenous Given  05/10/21 2035)    ED Course  I have reviewed the triage vital signs and the nursing notes.  Pertinent labs & imaging results that were available during my care of the patient were reviewed by me and considered in my medical decision making (see chart for details).    MDM Rules/Calculators/A&P                          Patient treated with migraine cocktail received 10 mg of Decadron 25 mg of Benadryl Toradol and Compazine IV along with 1 L of IV fluids feels much better.  Headaches completely resolved.   Final Clinical Impression(s) / ED Diagnoses Final diagnoses:  Migraine without status migrainosus, not intractable, unspecified migraine type    Rx / DC Orders ED Discharge Orders    None       Fredia Sorrow, MD 05/10/21 1023

## 2021-05-22 ENCOUNTER — Telehealth: Payer: Self-pay

## 2021-05-22 NOTE — Telephone Encounter (Signed)
Phone rep called pt back, she states she was diagnosed with Covid-19 on 05-26 she wants to know if since her upcoming appointment is 06-15 will that be enough isolation time or does she need to push her appointment out further.  Please call

## 2021-05-22 NOTE — Telephone Encounter (Signed)
Patient left a voicemail today with a question regarding an appointment with Judson Roch, NP.  Please call.

## 2021-05-22 NOTE — Telephone Encounter (Signed)
Given what we currently know about COVID-19 and the Omicron variant, CDC is shortening the recommended time for isolation for the public. People with COVID-19 should isolate for 5 days and if they are asymptomatic or their symptoms are resolving (without fever for 24 hours), follow that by 5 days of wearing a mask when around others to minimize the risk of infecting people they encounter. The change is motivated by science demonstrating that the majority of SARS-CoV-2 transmission occurs early in the course of illness, generally in the 1-2 days prior to onset of symptoms and the 2-3 days after.  This is note from [facility] guidelines.  Pt had 2 vaccines.  She is tired, notes some congestion.  She has appt 06-06-21 I gave her the information from [facility].  She is to be asymptomatic and fever free 24 hours prior.  I thought that her appt would be ok.  If not then let me know.  She is coming from [place].

## 2021-06-05 NOTE — Progress Notes (Signed)
PATIENT: [name] DOB: 01-08-1961  REASON FOR VISIT: follow up HISTORY FROM: patient Primary Neurologist:  HISTORY OF PRESENT ILLNESS: Today 06/06/21 [name] is a 60 year old female with history of migraine headaches.  Her pain is usually on the left side, had meningioma resected here.  Has had excellent benefit from [employer].  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  On Risperdal, reports resting tremor of the left arm. Around April, reported Aimovig no longer helpful.  Insurance would not approve [place]. In the ER, 3/25, 4/28, 5/19 for headache, last time got Decadron went 3-4 weeks with no headache. Reports did 1 Emgality injection, felt joints were achy, like on a statin, she stopped. Insurance wouldn't cover [place]. 5 headaches a week, 2 are severe, been going on 2-3 years ago. Imitrex works fairly well. Takes 2 Excedrin Migraine every 6 hours. Stumbles but no falls. Tremor in left hand comes and goes, mostly at rest. Mood is doing well, sees psychiatry. Here today with her husband.   HISTORY 02/06/2021 Dr. Jannifer Franklin: [name] is a 60 year old right-handed white female with a history of migraine headaches.  She has had a meningioma resected on the left of the head and her headaches tend to be on the side.  She was having very frequent severe headaches until she went on Aimovig.  After the second injection, the headaches essentially went away, she has had only 1 headache over the last month.  The patient continues to have difficulty with the balance, this is felt to be at least in part related to chronic lithium toxicity.  She has gone off of lithium with some improvement in her tremor but not with her balance.  She was set up for physical therapy but she indicates that her insurance company would not pay for physical therapy.  The patient has not had any falls, she may stumble on occasion.  She will use a cane or a walking stick when she is outside.  She is  on Risperdal.  She will note on occasion that she has some mild resting tremor with the left arm.  She comes back for further evaluation.   REVIEW OF SYSTEMS: Out of a complete 14 system review of symptoms, the patient complains only of the following symptoms, and all other reviewed systems are negative.  See HPI  ALLERGIES: Allergies  Allergen Reactions   Gabapentin Anaphylaxis   Oxcarbazepine Anaphylaxis   Tegretol [Carbamazepine] Nausea And Vomiting   Codeine Nausea Only   Divalproex Sodium Swelling   Erythromycin Nausea And Vomiting   Phenytoin Sodium Extended Swelling   Phenytoin Sodium Extended Rash    HOME MEDICATIONS: Outpatient Medications Prior to Visit  Medication Sig Dispense Refill   amLODipine (NORVASC) 2.5 MG tablet Take 2.5 mg by mouth daily.     aspirin-acetaminophen-caffeine (EXCEDRIN MIGRAINE) 250-250-65 MG tablet Take 2 tablets by mouth every 6 (six) hours as needed for migraine.     buPROPion (WELLBUTRIN XL) 300 MG 24 hr tablet Take 1 tablet (300 mg total) by mouth daily. TAKE ONE TABLET BY MOUTH EVERY [place] FOR DEPRESSION (Patient taking differently: Take 300 mg by mouth daily.) 90 tablet 0   clonazePAM (KLONOPIN) 1 MG tablet Take 1 tablet (1 mg total) by mouth at bedtime. 14 tablet 0   hydrOXYzine (ATARAX/VISTARIL) 10 MG tablet Take one to two tab as needed at bedtime. (Patient taking differently: Take 10-20 mg by mouth at bedtime.) 135 tablet 0   ibuprofen (  ADVIL,MOTRIN) 800 MG tablet Take 1 tablet (800 mg total) by mouth 3 (three) times daily. 21 tablet 0   metoprolol succinate (TOPROL-XL) 50 MG 24 hr tablet Take 100 mg by mouth daily.     prochlorperazine (COMPAZINE) 10 MG tablet Take 1 tablet (10 mg total) by mouth 2 (two) times daily as needed for nausea or vomiting. 10 tablet 0   risperiDONE (RISPERDAL) 1 MG tablet Take 1 tablet (1 mg total) by mouth at bedtime. FOR MOOD CONTROL (Patient taking differently: Take 1 mg by mouth at bedtime.) 90 tablet 0    SUMAtriptan (IMITREX) 50 MG tablet Take 1 tablet (50 mg total) by mouth every 2 (two) hours as needed for migraine. May repeat in 2 hours if headache persists or recurs. 12 tablet 11   Galcanezumab-gnlm (EMGALITY) 120 MG/ML SOAJ Inject 120 mg into the skin every 30 (thirty) days. (Patient not taking: Reported on 06/06/2021) 1 mL 11   predniSONE (DELTASONE) 5 MG tablet Begin taking 6 tablets daily, taper by one tablet daily until off the medication. (Patient not taking: Reported on 06/06/2021) 21 tablet 0   No facility-administered medications prior to visit.    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    PAST SURGICAL HISTORY: Past Surgical History:  Procedure Laterality Date   ABDOMINAL HYSTERECTOMY     BRAIN SURGERY     breast tumor removal     INCONTINENCE SURGERY     INTERSTIM IMPLANT PLACEMENT     KNEE SURGERY     right x 2   PLANTAR FASCIECTOMY Left 03/05/16   TUBAL LIGATION      FAMILY HISTORY: Family History  Problem Relation Age of Onset   Alcohol abuse Father    Diabetes Father    Mental retardation Father    Drug abuse Brother    Mental illness Mother    Breast cancer Mother    Dementia Neg Hx    ADD / ADHD Neg Hx    Anxiety disorder Neg Hx    Bipolar disorder Neg Hx    Depression Neg Hx    OCD Neg Hx    Paranoid behavior Neg Hx    Schizophrenia Neg Hx    Seizures Neg Hx    Sexual abuse Neg Hx    Physical abuse Neg Hx    Suicidality Neg Hx     SOCIAL HISTORY: Social History   Socioeconomic History   Marital status: Married    Spouse name: Holiday representative   Number of children: 2   Years of education: HS   Highest education level: Not on file  Occupational History   Occupation: Disabled  Tobacco  Use   Smoking status: Never   Smokeless tobacco: Never  Vaping Use   Vaping Use: Never used  Substance and Sexual Activity   Alcohol use: No    Alcohol/week: 0.0 standard drinks   Drug use: No   Sexual activity: Not Currently    Birth control/protection: None  Other Topics Concern   Not on file  Social History Narrative   Lives at home with husband.   Right-handed.   2 cups caffeine per day.  Social Determinants of Health   Financial Resource Strain: Not on file  Food Insecurity: Not on file  Transportation Needs: Not on file  Physical Activity: Not on file  Stress: Not on file  Social Connections: Not on file  Intimate Partner Violence: Not on file   PHYSICAL EXAM  Vitals:   06/06/21 1230  BP: 119/81  Pulse: 76  Weight: 197 lb (89.4 kg)  Height: 5\' 4"  (1.626 m)   Body mass index is 33.81 kg/m.  Generalized: Well developed, in no acute distress   Neurological examination  Mentation: Alert oriented to time, place, history taking. Follows all commands speech and language fluent Cranial nerve II-XII: Pupils were equal round reactive to light. Extraocular movements were full, visual field were full on confrontational test. Facial sensation and strength were normal. Head turning and shoulder shrug  were normal and symmetric. Motor: The motor testing reveals 5 over 5 strength of all 4 extremities. Good symmetric motor tone is noted throughout. No resting tremor was seen. Sensory: Sensory testing is intact to soft touch on all 4 extremities. No evidence of extinction is noted.  Coordination: with the left hand, misses her nose, touches her right cheekbone Gait and station: Gait is normal, good arm swing, turns.  Tandem gait is slightly unsteady. Reflexes: Deep tendon reflexes are symmetric and normal bilaterally.   DIAGNOSTIC DATA (LABS, IMAGING, TESTING) - I reviewed patient records, labs, notes, testing and imaging myself where available.  Lab Results  Component  Value Date   WBC 12.0 (H) 02/11/2020   HGB 15.1 (H) 02/11/2020   HCT 46.7 (H) 02/11/2020   MCV 94.3 02/11/2020   PLT 293 02/11/2020      Component Value Date/Time   NA 141 10/03/2020 1119   K 4.6 10/03/2020 1119   CL 106 10/03/2020 1119   CO2 22 10/03/2020 1119   GLUCOSE 91 10/03/2020 1119   GLUCOSE 106 (H) 02/11/2020 1731   BUN 13 10/03/2020 1119   CREATININE 1.03 (H) 10/03/2020 1119   CREATININE 1.22 (H) 02/23/2019 1412   CALCIUM 10.3 (H) 10/03/2020 1119   PROT 6.8 10/03/2020 1119   ALBUMIN 4.6 10/03/2020 1119   AST 11 10/03/2020 1119   ALT 10 10/03/2020 1119   ALKPHOS 94 10/03/2020 1119   BILITOT 0.5 10/03/2020 1119   GFRNONAA 60 10/03/2020 1119   GFRNONAA 61 04/01/2017 0847   GFRAA 70 10/03/2020 1119   GFRAA 71 04/01/2017 0847   Lab Results  Component Value Date   CHOL 174 05/13/2013   HDL 69 05/13/2013   LDLCALC 95 05/13/2013   TRIG 48 05/13/2013   CHOLHDL 2.5 05/13/2013   Lab Results  Component Value Date   HGBA1C 4.8 02/23/2019   Lab Results  Component Value Date   VITAMINB12 272 10/03/2020   Lab Results  Component Value Date   TSH 1.35 08/04/2019      ASSESSMENT AND PLAN 60 y.o. year old female  has a past medical history of Anxiety, Bipolar disorder ([place]), Brain tumor (benign) ([place]), Common migraine with intractable migraine (02/06/2021), Depression, HTN (hypertension), Mania ([place]), Meningioma ([place]), Migraine, and Tremor (02/06/2021). here with:  1.  Migraine headache   2.  History of left sided meningioma   3.  Gait instability   4.  Tremor  -Will try to get her set up for Botox therapy for chronic migraine headaches -Previously tried and failed Aimovig, Emgality, gabapentin, Depakote, Norvasc, propanolol, Fioricet -I do think there is a component of rebound headache with her  frequent Excedrin Migraine use, I have asked her to cut this back, use Imitrex for severe headache, ideally not treating acute headache more than 2, maybe 3 days a  week -Chronic gait issues felt related to chronic use of lithium, stable -I did not note any significant parkinsonian features, I did not see any resting tremor, but this is reported intermittently to the left arm, remains on Risperdal 1 mg daily for the mood, sees psychiatry, is needed -MRI of the brain was done in October 2021, no significant abnormalities, no recurrence of meningioma -Follow-up in 6 months or sooner if needed, will hopefully see her sooner for Botox therapy  I spent 33 minutes of face-to-face and non-face-to-face time with patient.  This included previsit chart review, lab review, study review, order entry, discussing medications, reviewing Botox, follow-up plan.  Butler Denmark, AGNP-C, DNP 06/06/2021, 1:56 PM [facility] [address] [place], [place] [address] [phone]

## 2021-06-06 ENCOUNTER — Ambulatory Visit: Payer: Medicare PPO | Admitting: Neurology

## 2021-06-06 ENCOUNTER — Encounter: Payer: Self-pay | Admitting: Neurology

## 2021-06-06 ENCOUNTER — Telehealth: Payer: Self-pay | Admitting: Neurology

## 2021-06-06 VITALS — BP 119/81 | HR 76 | Ht 64.0 in | Wt 197.0 lb

## 2021-06-06 DIAGNOSIS — R251 Tremor, unspecified: Secondary | ICD-10-CM

## 2021-06-06 DIAGNOSIS — G43019 Migraine without aura, intractable, without status migrainosus: Secondary | ICD-10-CM

## 2021-06-06 MED ORDER — SUMATRIPTAN SUCCINATE 50 MG PO TABS: 50.0000 mg | ORAL_TABLET | ORAL | 11 refills | Status: DC | PRN

## 2021-06-06 NOTE — Telephone Encounter (Signed)
Can we try to get [facility] setup for Botox therapy for chronic migraine headaches, please see recent office note for frequency/tried and failed.

## 2021-06-06 NOTE — Patient Instructions (Signed)
Will try to get set up for Botox therapy  Cut back on Excedrin Migraine use  Use Imitrex for severe headaches  See you back in 6 months

## 2021-06-06 NOTE — Progress Notes (Signed)
I have read the note, and I agree with the clinical assessment and plan.  Kaori Jumper K Tevis Conger   

## 2021-06-07 ENCOUNTER — Other Ambulatory Visit: Payer: Self-pay

## 2021-06-07 ENCOUNTER — Telehealth (INDEPENDENT_AMBULATORY_CARE_PROVIDER_SITE_OTHER): Payer: Medicare PPO | Admitting: Psychiatry

## 2021-06-07 ENCOUNTER — Encounter (HOSPITAL_COMMUNITY): Payer: Self-pay | Admitting: Psychiatry

## 2021-06-07 DIAGNOSIS — F319 Bipolar disorder, unspecified: Secondary | ICD-10-CM | POA: Diagnosis not present

## 2021-06-07 DIAGNOSIS — F411 Generalized anxiety disorder: Secondary | ICD-10-CM | POA: Diagnosis not present

## 2021-06-07 MED ORDER — HYDROXYZINE HCL 10 MG PO TABS
ORAL_TABLET | ORAL | 0 refills | Status: DC
Start: 2021-06-07 — End: 2021-09-07

## 2021-06-07 MED ORDER — CLONAZEPAM 1 MG PO TABS: 1.0000 mg | ORAL_TABLET | Freq: Every day | ORAL | 0 refills | Status: DC

## 2021-06-07 MED ORDER — BUPROPION HCL ER (XL) 300 MG PO TB24: 300.0000 mg | ORAL_TABLET | Freq: Every day | ORAL | 0 refills | Status: DC

## 2021-06-07 MED ORDER — RISPERIDONE 1 MG PO TABS: 1.0000 mg | ORAL_TABLET | Freq: Every day | ORAL | 0 refills | Status: DC

## 2021-06-07 NOTE — Progress Notes (Signed)
Virtual Visit via Telephone Note  I connected with [name] on 06/07/21 at  2:40 PM EDT by telephone and verified that I am speaking with the correct person using two identifiers.  Location: Patient: Home Provider: Home Office   I discussed the limitations, risks, security and privacy concerns of performing an evaluation and management service by telephone and the availability of in person appointments. I also discussed with the patient that there may be a patient responsible charge related to this service. The patient expressed understanding and agreed to proceed.   History of Present Illness: Patient is evaluated by phone session.  Lately she having a lot of migraine headaches and seen in the urgent care few times.  Her physician recommended they are trying to get approval to get Botox injection to control these migraine headaches.  Overall she feels her depression is stable.  She denies any suicidal thoughts, crying spells, feeling of hopelessness or worthlessness.  She admitted 3 pounds weight gain because she is not watching her calorie intake and not doing exercise.  Few days ago she had a birthday and she had a good time with the family.  Patient has no tremors shakes or any EPS.  Occasionally she has bad dreams but she do not recall very well.  She does not want to change the medication since she felt the Risperdal, Wellbutrin, Klonopin and hydroxyzine combination working well.  She sleeps good and really need a second dose of hydroxyzine.  Her level is okay.   Past Psychiatric History: Reviewed. H/O bipolar disorder and multiple inpatient treatment.  Last inpatient in September 2014 for suicidal thinking, hallucination as playing with guns and superficially cut her wrist with a needle.  H/O mania and psychosis. Tried Zyprexa Seroquel but stopped due to weight gain.  Tegretol caused increased paranoia confusion and required inpatient.      Psychiatric Specialty Exam: Physical Exam   Review of Systems  Weight 197 lb (89.4 kg).There is no height or weight on file to calculate BMI.  General Appearance: NA  Eye Contact:  NA  Speech:  Slow  Volume:  Decreased  Mood:  Euthymic  Affect:  NA  Thought Process:  Goal Directed  Orientation:  Full (Time, Place, and Person)  Thought Content:  WDL  Suicidal Thoughts:  No  Homicidal Thoughts:  No  Memory:  Immediate;   Good Recent;   Fair Remote;   Fair  Judgement:  Intact  Insight:  Present  Psychomotor Activity:  NA  Concentration:  Concentration: Fair and Attention Span: Fair  Recall:  [employer] of Knowledge:  Fair  Language:  Fair  Akathisia:  No  Handed:  Right  AIMS (if indicated):     Assets:  Communication Skills Desire for Improvement Housing Resilience Social Support  ADL's:  Intact  Cognition:  WNL  Sleep:   ok      Assessment and Plan: Bipolar disorder type I.  Anxiety.  Patient is a stable on her current medication.  She is hoping to get approval for Botox injections for her headaches get under control.  She acknowledged weight gain and like to go back on exercise and watch her calorie intake.  Overall she feels her mood is stable and denies any mania or psychosis.  I will continue Risperdal 1 mg at bedtime, hydroxyzine 10-20 mg to take at bedtime for insomnia, Wellbutrin XL 300 mg daily.  Klonopin 1 mg at bedtime.  Patient like to get Klonopin to the  local pharmacy as she has difficulty getting her medications through [facility].  Rest of her medication she wants to keep at [facility].  Recommended to call us back if she has any question of any concern.  Follow-up in 3 months.  Follow Up Instructions:    I discussed the assessment and treatment plan with the patient. The patient was provided an opportunity to ask questions and all were answered. The patient agreed with the plan and demonstrated an understanding of the instructions.   The patient was advised to call back or seek an in-person evaluation  if the symptoms worsen or if the condition fails to improve as anticipated.  I provided 15 minutes of non-face-to-face time during this encounter.   [name], MD

## 2021-06-11 NOTE — Telephone Encounter (Signed)
Patient has [employer]. I filled out [facility] PA form and gave to MD to sign.   CPT: H9144 ICD: G43.019

## 2021-06-13 NOTE — Telephone Encounter (Signed)
Faxed signed PA form to [facility] w/ office notes. Phone: [phone].

## 2021-06-14 NOTE — Telephone Encounter (Signed)
Received approval from [facility] PA #[id number] (06/13/21- 12/22/21). I called patient and got her scheduled for 7/20 at 1:45.

## 2021-07-11 ENCOUNTER — Other Ambulatory Visit: Payer: Self-pay

## 2021-07-11 ENCOUNTER — Ambulatory Visit: Payer: Medicare PPO | Admitting: Neurology

## 2021-07-11 DIAGNOSIS — G43019 Migraine without aura, intractable, without status migrainosus: Secondary | ICD-10-CM

## 2021-07-11 NOTE — Procedures (Signed)
BOTOX PROCEDURE NOTE FOR MIGRAINE HEADACHE   HISTORY: [name] is a 60 year old female who presents today for her first Botox injection for chronic migraine headache. Reports 5 headaches a week. Lost benefit of Aimovig. Here today with her husband.   My office note 06/06/21 SS: [name] is a 60 year old female with history of migraine headaches.  Her pain is usually on the left side, had meningioma resected here.  Has had excellent benefit from [place].  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  On Risperdal, reports resting tremor of the left arm. Around April, reported Aimovig no longer helpful.  Insurance would not approve [place]. In the ER, 3/25, 4/28, 5/19 for headache, last time got Decadron went 3-4 weeks with no headache. Reports did 1 Emgality injection, felt joints were achy, like on a statin, she stopped. Insurance wouldn't cover [place]. 5 headaches a week, 2 are severe, been going on 2-3 years ago. Imitrex works fairly well. Takes 2 Excedrin Migraine every 6 hours. Stumbles but no falls. Tremor in left hand comes and goes, mostly at rest. Mood is doing well, sees psychiatry. Here today with her husband.    Description of procedure:  The patient was placed in a sitting position. The standard protocol was used for Botox as follows, with 5 units of Botox injected at each site:   -Procerus muscle, midline injection  -Corrugator muscle, bilateral injection  -Frontalis muscle, bilateral injection, with 2 sites each side, medial injection was performed in the upper one third of the frontalis muscle, in the region vertical from the medial inferior edge of the superior orbital rim. The lateral injection was again in the upper one third of the forehead vertically above the lateral limbus of the cornea, 1.5 cm lateral to the medial injection site.  -Temporalis muscle injection, 4 sites, bilaterally. The first injection was 3 cm above the tragus of the  ear, second injection site was 1.5 cm to 3 cm up from the first injection site in line with the tragus of the ear. The third injection site was 1.5-3 cm forward between the first 2 injection sites. The fourth injection site was 1.5 cm posterior to the second injection site.  -Occipitalis muscle injection, 3 sites, bilaterally. The first injection was done one half way between the occipital protuberance and the tip of the mastoid process behind the ear. The second injection site was done lateral and superior to the first, 1 fingerbreadth from the first injection. The third injection site was 1 fingerbreadth superiorly and medially from the first injection site.  -Cervical paraspinal muscle injection, 2 sites, bilateral, the first injection site was 1 cm from the midline of the cervical spine, 3 cm inferior to the lower border of the occipital protuberance. The second injection site was 1.5 cm superiorly and laterally to the first injection site.  -Trapezius muscle injection was performed at 3 sites, bilaterally. The first injection site was in the upper trapezius muscle halfway between the inflection point of the neck, and the acromion. The second injection site was one half way between the acromion and the first injection site. The third injection was done between the first injection site and the inflection point of the neck.   A 200 unit bottle of Botox was used, 155 units were injected, the rest of the Botox was wasted. The patient tolerated the procedure well, there were no complications of the above procedure.  Botox NDC 5885-0277-41 Lot number [id number]  Expiration date 05/2023 B/B

## 2021-07-11 NOTE — Progress Notes (Signed)
Botox- 200 units x 2 vial Lot: [id number] Expiration: 05/2023 NDC: 3567-0141-03   Bacteriostatic 0.9% Sodium Chloride- 42mL total [id number] Expiration: 12/23 NDC: 57972-820-60   B/B Consent signed today

## 2021-08-20 ENCOUNTER — Telehealth (HOSPITAL_COMMUNITY): Payer: Self-pay | Admitting: *Deleted

## 2021-08-20 NOTE — Telephone Encounter (Signed)
Received lab results resulted on 08/19/21. Ordered by Dr. Macarthur Critchley. Labs WNL with the exception of A/G Ration @ 2.3, Total Cholesterol high @ 218, LDL 143. Urine shows WBC Esterase @ 2+. Results to be scanned to chart.

## 2021-09-06 ENCOUNTER — Telehealth (HOSPITAL_COMMUNITY): Payer: Medicare PPO | Admitting: Psychiatry

## 2021-09-07 ENCOUNTER — Other Ambulatory Visit: Payer: Self-pay

## 2021-09-07 ENCOUNTER — Telehealth (INDEPENDENT_AMBULATORY_CARE_PROVIDER_SITE_OTHER): Payer: Medicare PPO | Admitting: Psychiatry

## 2021-09-07 ENCOUNTER — Encounter (HOSPITAL_COMMUNITY): Payer: Self-pay | Admitting: Psychiatry

## 2021-09-07 DIAGNOSIS — F411 Generalized anxiety disorder: Secondary | ICD-10-CM | POA: Diagnosis not present

## 2021-09-07 DIAGNOSIS — F319 Bipolar disorder, unspecified: Secondary | ICD-10-CM

## 2021-09-07 MED ORDER — RISPERIDONE 1 MG PO TABS: 1.0000 mg | ORAL_TABLET | Freq: Every day | ORAL | 0 refills | Status: DC

## 2021-09-07 MED ORDER — BUPROPION HCL ER (XL) 300 MG PO TB24: 300.0000 mg | ORAL_TABLET | Freq: Every day | ORAL | 0 refills | Status: DC

## 2021-09-07 MED ORDER — CLONAZEPAM 1 MG PO TABS: 1.0000 mg | ORAL_TABLET | Freq: Every day | ORAL | 0 refills | Status: DC

## 2021-09-07 MED ORDER — HYDROXYZINE HCL 10 MG PO TABS
ORAL_TABLET | ORAL | 0 refills | Status: DC
Start: 2021-09-07 — End: 2021-11-30

## 2021-09-07 NOTE — Progress Notes (Signed)
Virtual Visit via Telephone Note  I connected with [name] on 09/07/21 at 10:00 AM EDT by telephone and verified that I am speaking with the correct person using two identifiers.  Location: Patient: Home Provider: Home Office   I discussed the limitations, risks, security and privacy concerns of performing an evaluation and management service by telephone and the availability of in person appointments. I also discussed with the patient that there may be a patient responsible charge related to this service. The patient expressed understanding and agreed to proceed.   History of Present Illness: She is evaluated by phone session.  She reported her symptoms are manageable.  She denies any highs or lows, mania or any psychosis.  Her headaches are much better since she started Botox injection.  Some nights she has difficulty sleeping as she frequently wake up but hydroxyzine helps.  Most of the time she takes 2 pills.  Patient reported she has twice had mild mental breakdown which she recall some crying spells and feeling sad but talking to son help and calm her down.  She feels a current medicine is working and she denies any major issues.  She has mild tremors but does not interfere in her daily activities.  She denies any paranoia, hallucination or any suicidal thoughts.  She does talk to her son regularly and also keeping grandchild.  Her appetite is okay.  Her energy level is okay.  She denies any anger.  Past Psychiatric History: Reviewed. H/O bipolar disorder and multiple inpatient treatment.  Last inpatient in September 2014 for suicidal thinking, hallucination as playing with guns and superficially cut her wrist with a needle.  H/O mania and psychosis. Tried Zyprexa Seroquel but stopped due to weight gain.  Tegretol caused increased paranoia confusion and required inpatient.    Psychiatric Specialty Exam: Physical Exam  Review of Systems  Weight 195 lb (88.5 kg).There is no height or  weight on file to calculate BMI.  General Appearance: NA  Eye Contact:  NA  Speech:  Normal Rate  Volume:  Normal  Mood:  Anxious  Affect:  NA  Thought Process:  Goal Directed  Orientation:  Full (Time, Place, and Person)  Thought Content:  Rumination  Suicidal Thoughts:  No  Homicidal Thoughts:  No  Memory:  Immediate;   Good Recent;   Fair Remote;   Fair  Judgement:  Intact  Insight:  Present  Psychomotor Activity:  NA  Concentration:  Concentration: Fair and Attention Span: Fair  Recall:  [employer] of Knowledge:  Fair  Language:  Good  Akathisia:  No  Handed:  Right  AIMS (if indicated):     Assets:  Communication Skills Desire for Improvement Housing Resilience Social Support  ADL's:  Intact  Cognition:  WNL  Sleep:   fair      Assessment and Plan: Bipolar disorder type I.  Anxiety.  Patient is stable on her medication however occasionally she had episodes but able to manage her symptoms.  Her headaches are better.  She is getting Botox injection.  Patient does not want to change the medication.  Continue Klonopin 1 mg at bedtime, Wellbutrin XL 300 mg daily, Risperdal 1 mg at bedtime and hydroxyzine 10-20 mg at bedtime for insomnia.  Patient prefers to get her medication from local pharmacy [facility].  We will provide a 90-day supply to her local pharmacy.  Recommended to call us back if she has any question or any concern.  Follow-up in 3  months.  Follow Up Instructions:    I discussed the assessment and treatment plan with the patient. The patient was provided an opportunity to ask questions and all were answered. The patient agreed with the plan and demonstrated an understanding of the instructions.   The patient was advised to call back or seek an in-person evaluation if the symptoms worsen or if the condition fails to improve as anticipated.  I provided 14 minutes of non-face-to-face time during this encounter.   [name], MD

## 2021-10-10 ENCOUNTER — Ambulatory Visit: Payer: Medicare PPO | Admitting: Neurology

## 2021-10-18 ENCOUNTER — Ambulatory Visit: Payer: Medicare PPO | Admitting: Adult Health

## 2021-10-22 ENCOUNTER — Telehealth (HOSPITAL_COMMUNITY): Payer: Medicare PPO | Admitting: Psychiatry

## 2021-10-24 ENCOUNTER — Ambulatory Visit: Payer: Medicare PPO | Admitting: Adult Health

## 2021-10-24 DIAGNOSIS — G43709 Chronic migraine without aura, not intractable, without status migrainosus: Secondary | ICD-10-CM

## 2021-10-24 NOTE — Progress Notes (Signed)
       BOTOX PROCEDURE NOTE FOR MIGRAINE HEADACHE    Contraindications and precautions discussed with patient(above). Aseptic procedure was observed and patient tolerated procedure. Procedure performed by Ward Givens, NP  The condition has existed for more than 6 months, and pt does not have a diagnosis of ALS, Myasthenia Gravis or Lambert-Eaton Syndrome.  Risks and benefits of injections discussed and pt agrees to proceed with the procedure.  Written consent obtained  These injections are medically necessary. She receives good benefits from these injections. These injections do not cause sedations or hallucinations which the oral therapies may cause.  Indication/Diagnosis: chronic migraine BOTOX(J0585) injection was performed according to protocol by [employer]. 200 units of BOTOX was dissolved into 4 cc NS.   NDC: [address]-01  Type of toxin: Botox  Botox- 200 units x 1 vial Lot: [id number] Expiration: 03/2024 NDC: 3557-3220-25   Bacteriostatic 0.9% Sodium Chloride- 49mL total Lot: [id number] Expiration: 12/23/2022 NDC: 3762-8315-17   Dx: O16.073 B/B   Description of procedure:  The patient was placed in a sitting position. The standard protocol was used for Botox as follows, with 5 units of Botox injected at each site:   -Procerus muscle, midline injection  -Corrugator muscle, bilateral injection  -Frontalis muscle, bilateral injection, with 2 sites each side, medial injection was performed in the upper one third of the frontalis muscle, in the region vertical from the medial inferior edge of the superior orbital rim. The lateral injection was again in the upper one third of the forehead vertically above the lateral limbus of the cornea, 1.5 cm lateral to the medial injection site.  -Temporalis muscle injection, 4 sites, bilaterally. The first injection was 3 cm above the tragus of the ear, second injection site was 1.5 cm to 3 cm up from the first injection site in line  with the tragus of the ear. The third injection site was 1.5-3 cm forward between the first 2 injection sites. The fourth injection site was 1.5 cm posterior to the second injection site.  -Occipitalis muscle injection, 3 sites, bilaterally. The first injection was done one half way between the occipital protuberance and the tip of the mastoid process behind the ear. The second injection site was done lateral and superior to the first, 1 fingerbreadth from the first injection. The third injection site was 1 fingerbreadth superiorly and medially from the first injection site.  -Cervical paraspinal muscle injection, 2 sites, bilateral knee first injection site was 1 cm from the midline of the cervical spine, 3 cm inferior to the lower border of the occipital protuberance. The second injection site was 1.5 cm superiorly and laterally to the first injection site.  -Trapezius muscle injection was performed at 3 sites, bilaterally. The first injection site was in the upper trapezius muscle halfway between the inflection point of the neck, and the acromion. The second injection site was one half way between the acromion and the first injection site. The third injection was done between the first injection site and the inflection point of the neck.   Will return for repeat injection in 3 months.   A 200 units of Botox was used, 155 units were injected, the rest of the Botox was wasted. The patient tolerated the procedure well, there were no complications of the above procedure.  Ward Givens, MSN, NP-C 10/24/2021, 8:20 AM [facility] [address], [place], [place] [address] [phone]

## 2021-10-24 NOTE — Progress Notes (Signed)
Botox- 200 units x 1 vial Lot: [id number] Expiration: 03/2024 NDC: 4540-9811-91  Bacteriostatic 0.9% Sodium Chloride- 45mL total Lot: [id number] Expiration: 12/23/2022 NDC: 6213-0865-78  Dx: I69.629 B/B

## 2021-10-25 ENCOUNTER — Other Ambulatory Visit: Payer: Self-pay | Admitting: Adult Health

## 2021-10-25 ENCOUNTER — Telehealth: Payer: Self-pay | Admitting: Adult Health

## 2021-10-25 MED ORDER — SUMATRIPTAN SUCCINATE 100 MG PO TABS: 100.0000 mg | ORAL_TABLET | Freq: Once | ORAL | 2 refills | Status: DC | PRN

## 2021-10-25 NOTE — Telephone Encounter (Signed)
Spoke to pt and relayed about increase of sumatriptan 100mg  tabs take one on onset of migraine and may take another 2 hours later if needed.  Only 200mg  max in 24 hours.  Did mention about rebound headaches due to OTC excedrin migraine/ tylenol/ motrin.  Pt aware .  Appreciated call back.

## 2021-10-25 NOTE — Telephone Encounter (Signed)
Called pt and she is having migraine and imitrex 50mg  tabs are not working for her (even taking 2 daily).  She wasn't to try and increase to 100mg  tabs would that be ok.  She had botox yesterday , I relayed that she may need to urgent care or pcp if not able to get relief.

## 2021-10-25 NOTE — Telephone Encounter (Signed)
Ok to try sumatriptan 100 mg at the onset of migraine repeat in 2 hours if needed. Max is 200 mg in 24 hours. Please caution about rebound headaches

## 2021-10-25 NOTE — Telephone Encounter (Signed)
Patient called me regarding a headache she has had since yesterday's Botox. She is requesting that the Imitrex be increased because the current dose is not helping the headache.

## 2021-11-17 ENCOUNTER — Other Ambulatory Visit (HOSPITAL_COMMUNITY): Payer: Self-pay | Admitting: Psychiatry

## 2021-11-17 DIAGNOSIS — F411 Generalized anxiety disorder: Secondary | ICD-10-CM

## 2021-11-19 ENCOUNTER — Other Ambulatory Visit (HOSPITAL_COMMUNITY): Payer: Self-pay | Admitting: *Deleted

## 2021-11-19 ENCOUNTER — Telehealth (HOSPITAL_COMMUNITY): Payer: Self-pay | Admitting: *Deleted

## 2021-11-19 MED ORDER — RISPERIDONE 0.5 MG PO TABS: 0.5000 mg | ORAL_TABLET | Freq: Every day | ORAL | 0 refills | Status: DC

## 2021-11-19 NOTE — Telephone Encounter (Signed)
Her Imitrex doses were increased to 200 mg.  If she does not feel it is medication related hallucinations then she can try Risperdal from 1mg  to 1.5 mg.

## 2021-11-19 NOTE — Telephone Encounter (Signed)
I reviewed her chart and recently she was given medicine for headaches.  Please clarify if headache medicine causing these hallucinations since we have not change or decrease the dose of the medication on the last visit.  If symptoms still persist then she should take the hydroxyzine at night to improve sleep.

## 2021-11-19 NOTE — Telephone Encounter (Signed)
Pt called with c/o seeing "little men" who tell her to "get up in the middle of the night" and to "throw my puppy on the floor". Pt denies any command [facility] to hurt self or family. Pt is questioning where medications need to be changed. Pt advised they have an appointment upcoming on 11/30/21 however this is distressing to pt presently. Please review and advise.

## 2021-11-19 NOTE — Telephone Encounter (Signed)
Ok will call pt back. Thanks.

## 2021-11-19 NOTE — Telephone Encounter (Signed)
Imitrex and Botox (Toprol) are all I see for migraines and appears she's been on these for some time; Imitrex for a year, Botox for 6 months ( recently had second dose on 10/24/21 ). Pt states that she does Vistaril 20 mg total qhs. Pt states no s/e from these medications. Please advise. Thanks.

## 2021-11-30 ENCOUNTER — Encounter (HOSPITAL_COMMUNITY): Payer: Self-pay | Admitting: Psychiatry

## 2021-11-30 ENCOUNTER — Other Ambulatory Visit: Payer: Self-pay

## 2021-11-30 ENCOUNTER — Telehealth (HOSPITAL_BASED_OUTPATIENT_CLINIC_OR_DEPARTMENT_OTHER): Payer: Medicare PPO | Admitting: Psychiatry

## 2021-11-30 DIAGNOSIS — F411 Generalized anxiety disorder: Secondary | ICD-10-CM

## 2021-11-30 DIAGNOSIS — F319 Bipolar disorder, unspecified: Secondary | ICD-10-CM | POA: Diagnosis not present

## 2021-11-30 MED ORDER — CLONAZEPAM 1 MG PO TABS: 1.0000 mg | ORAL_TABLET | Freq: Every day | ORAL | 0 refills | Status: DC

## 2021-11-30 MED ORDER — RISPERIDONE 1 MG PO TABS
ORAL_TABLET | ORAL | 0 refills | Status: DC
Start: 2021-11-30 — End: 2021-12-03

## 2021-11-30 MED ORDER — BUPROPION HCL ER (XL) 300 MG PO TB24: 300.0000 mg | ORAL_TABLET | Freq: Every day | ORAL | 0 refills | Status: DC

## 2021-11-30 MED ORDER — HYDROXYZINE HCL 10 MG PO TABS: ORAL_TABLET | ORAL | 0 refills | Status: DC

## 2021-11-30 NOTE — Progress Notes (Signed)
Virtual Visit via Telephone Note  I connected with [name] on 11/30/21 at 11:20 AM EST by telephone and verified that I am speaking with the correct person using two identifiers.  Location: Patient: Home Provider: Home Office   I discussed the limitations, risks, security and privacy concerns of performing an evaluation and management service by telephone and the availability of in person appointments. I also discussed with the patient that there may be a patient responsible charge related to this service. The patient expressed understanding and agreed to proceed.   History of Present Illness: Patient is evaluated by phone session.  She endorsed increased paranoia and feeling very nervous and anxious for the past few weeks.  Patient told she has to cook on the Thanksgiving and she got very nervous and anxious.  She endorsed having auditory hallucination and visual hallucinations seeing a little man telling her to throw her puppy.  We have increased the Risperdal and she is now taking extra 0.5 mg at bedtime.  She noticed some improvement but is still scared to go outside.  She does all her Christmas shopping on line.  She does not leave the house.  She also reported a phone call from the mother made her more stressed went she told that she has no place to live.  Patient told her mother's new husband passed away and she is not sure if she wanted to move in with the patient.  Patient had stopped picking up the phone call and that helps.  Her husband has brought a new puppy which she like to keep because she enjoyed the [place] but concerned about hallucinations.  She tried to stay busy and try to avoid by herself in the house.  She is more comfortable when her husband really around.  She is excited because her 2 children are coming with 3 grandchildren for Christmas.  Her plan is not to cook and she is relieved.  She is taking Klonopin, hydroxyzine, Wellbutrin and Risperdal.  She has no  tremors, shakes or any EPS.  Recently she had started Botox that helps her headaches.  She has not taken Imitrex even though the dose was increased lately because she does not need it.  She denies any suicidal thoughts or homicidal thoughts.  She is pleased that her Thanksgiving shopping is done.   Past Psychiatric History: Reviewed. H/O bipolar disorder and multiple inpatient treatment.  Last inpatient in September 2014 for suicidal thinking, hallucination as playing with guns and superficially cut her wrist with a needle.  H/O mania and psychosis. Tried Zyprexa Seroquel but stopped due to weight gain.  Tegretol caused increased paranoia confusion and required inpatient.    Psychiatric Specialty Exam: Physical Exam  Review of Systems  Weight 195 lb (88.5 kg).There is no height or weight on file to calculate BMI.  General Appearance: NA  Eye Contact:  NA  Speech:  Slow  Volume:  Decreased  Mood:  Anxious  Affect:  NA  Thought Process:  Descriptions of Associations: Intact  Orientation:  Full (Time, Place, and Person)  Thought Content:  Hallucinations: Auditory and Paranoid Ideation  Suicidal Thoughts:  No  Homicidal Thoughts:  No  Memory:  Immediate;   Good Recent;   Fair Remote;   Fair  Judgement:  Fair  Insight:  Present  Psychomotor Activity:  NA  Concentration:  Concentration: Fair and Attention Span: Fair  Recall:  Good  Fund of Knowledge:  Good  Language:  Good  Akathisia:  No  Handed:  Right  AIMS (if indicated):     Assets:  Communication Skills Desire for Improvement Housing Resilience Social Support  ADL's:  Intact  Cognition:  WNL  Sleep:   ok      Assessment and Plan: Bipolar disorder type I.  Anxiety.  Discussed recent stress related to family issues may have trigger her symptoms.  She noticed marginal improvement with the extra Risperdal.  I strongly encouraged she should consider therapy to help her coping skills.  She like to adjust the dose of  Risperdal as she still have residual symptoms.  We will try Risperdal 0.5 mg in the morning and she will continue 1.5 mg at bedtime.  Encouraged to continue hydroxyzine to take 20 mg at bedtime, Klonopin 1 mg at bedtime and Wellbutrin XL 300 mg daily.  Patient is hoping to find a therapist in [place] so she can start therapy to help her coping skills.  We discussed safety concerns and anytime having active suicidal thoughts or homicidal thought that she need to call 911 or go to local emergency room.  Patient like to have a follow-up in 3 months.  Follow Up Instructions:    I discussed the assessment and treatment plan with the patient. The patient was provided an opportunity to ask questions and all were answered. The patient agreed with the plan and demonstrated an understanding of the instructions.   The patient was advised to call back or seek an in-person evaluation if the symptoms worsen or if the condition fails to improve as anticipated.  I provided 25 minutes of non-face-to-face time during this encounter.   [name], MD

## 2021-12-03 ENCOUNTER — Other Ambulatory Visit (HOSPITAL_COMMUNITY): Payer: Self-pay | Admitting: *Deleted

## 2021-12-03 DIAGNOSIS — F319 Bipolar disorder, unspecified: Secondary | ICD-10-CM

## 2021-12-03 MED ORDER — RISPERIDONE 1 MG PO TABS: ORAL_TABLET | ORAL | 0 refills | Status: DC

## 2021-12-06 ENCOUNTER — Telehealth (HOSPITAL_COMMUNITY): Payer: Medicare PPO | Admitting: Psychiatry

## 2021-12-11 NOTE — Progress Notes (Signed)
PATIENT: [name] DOB: 1961/01/11  REASON FOR VISIT: follow up for migraine headache  HISTORY FROM: patient Primary Neurologist: Dr. Billey Gosling since Dr. Jannifer Franklin retired   HISTORY OF PRESENT ILLNESS: Today 12/12/21 [name] here today for follow-up for migraine headaches. Has had 2 cycles of Botox therapy, last was in November. At least 75% helpful for migraine, excellent benefit.  Taking Imitrex as needed for acute headache.  On average having 2 migraines a month, are less severe.  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  Has close follow-up with her psychiatrist, recently her Risperdal had to be increased due to hearing voices, seeing things.  Otherwise doing well, no new issues.  She has a resting tremor, indicates could be either, but today the right.  Here with her husband.   Update 06/06/2021 SS: [name] is a 60 year old female with history of migraine headaches.  Her pain is usually on the left side, had meningioma resected here.  Has had excellent benefit from [place].  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  On Risperdal, reports resting tremor of the left arm. Around April, reported Aimovig no longer helpful.  Insurance would not approve [place]. In the ER, 3/25, 4/28, 5/19 for headache, last time got Decadron went 3-4 weeks with no headache. Reports did 1 Emgality injection, felt joints were achy, like on a statin, she stopped. Insurance wouldn't cover [place]. 5 headaches a week, 2 are severe, been going on 2-3 years ago. Imitrex works fairly well. Takes 2 Excedrin Migraine every 6 hours. Stumbles but no falls. Tremor in left hand comes and goes, mostly at rest. Mood is doing well, sees psychiatry. Here today with her husband.   HISTORY 02/06/2021 Dr. Jannifer Franklin: [name] is a 60 year old right-handed white female with a history of migraine headaches.  She has had a meningioma resected on the left of the head and her  headaches tend to be on the side.  She was having very frequent severe headaches until she went on Aimovig.  After the second injection, the headaches essentially went away, she has had only 1 headache over the last month.  The patient continues to have difficulty with the balance, this is felt to be at least in part related to chronic lithium toxicity.  She has gone off of lithium with some improvement in her tremor but not with her balance.  She was set up for physical therapy but she indicates that her insurance company would not pay for physical therapy.  The patient has not had any falls, she may stumble on occasion.  She will use a cane or a walking stick when she is outside.  She is on Risperdal.  She will note on occasion that she has some mild resting tremor with the left arm.  She comes back for further evaluation.   REVIEW OF SYSTEMS: Out of a complete 14 system review of symptoms, the patient complains only of the following symptoms, and all other reviewed systems are negative.  See HPI  ALLERGIES: Allergies  Allergen Reactions   Gabapentin Anaphylaxis   Oxcarbazepine Anaphylaxis   Tegretol [Carbamazepine] Nausea And Vomiting   Codeine Nausea Only   Divalproex Sodium Swelling   Erythromycin Nausea And Vomiting   Phenytoin Sodium Extended Swelling   Phenytoin Sodium Extended Rash    HOME MEDICATIONS: Outpatient Medications Prior to Visit  Medication Sig Dispense Refill   amLODipine (NORVASC) 2.5 MG tablet Take  2.5 mg by mouth daily.     buPROPion (WELLBUTRIN XL) 300 MG 24 hr tablet Take 1 tablet (300 mg total) by mouth daily. TAKE ONE TABLET BY MOUTH EVERY MORINING FOR DEPRESSION 90 tablet 0   clonazePAM (KLONOPIN) 1 MG tablet Take 1 tablet (1 mg total) by mouth at bedtime. 90 tablet 0   hydrOXYzine (ATARAX) 10 MG tablet Take one to two tab as needed at bedtime. 135 tablet 0   metoprolol succinate (TOPROL-XL) 50 MG 24 hr tablet Take 100 mg by mouth daily.     prochlorperazine  (COMPAZINE) 10 MG tablet Take 1 tablet (10 mg total) by mouth 2 (two) times daily as needed for nausea or vomiting. 10 tablet 0   risperiDONE (RISPERDAL) 1 MG tablet Take 1/2 tab in am and 1/-1/2 tablets (1.5 mg total dose) at bedtime. 180 tablet 0   SUMAtriptan (IMITREX) 100 MG tablet Take 1 tablet (100 mg total) by mouth once as needed for up to 1 dose for migraine. May repeat in 2 hours if headache persists or recurs. 10 tablet 2   No facility-administered medications prior to visit.    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    PAST SURGICAL HISTORY: Past Surgical History:  Procedure Laterality Date   ABDOMINAL HYSTERECTOMY     BRAIN SURGERY     breast tumor removal     INCONTINENCE SURGERY     INTERSTIM IMPLANT PLACEMENT     KNEE SURGERY     right x 2   PLANTAR FASCIECTOMY Left 03/05/16   TUBAL LIGATION      FAMILY HISTORY: Family History  Problem Relation Age of Onset   Alcohol abuse Father    Diabetes Father    Mental retardation Father    Drug abuse Brother    Mental illness Mother    Breast cancer Mother    Dementia Neg Hx    ADD / ADHD Neg Hx    Anxiety disorder Neg Hx    Bipolar disorder Neg Hx    Depression Neg Hx    OCD Neg Hx    Paranoid behavior Neg Hx    Schizophrenia Neg Hx    Seizures Neg Hx    Sexual abuse Neg Hx    Physical abuse Neg Hx    Suicidality Neg Hx     SOCIAL HISTORY: Social History   Socioeconomic History   Marital status: Married    Spouse name: Holiday representative   Number of children: 2   Years of education: HS   Highest education level: Not on file  Occupational History   Occupation: Disabled  Tobacco Use   Smoking status: Never   Smokeless tobacco: Never  Vaping Use    Vaping Use: Never used  Substance and Sexual Activity   Alcohol use: No    Alcohol/week: 0.0 standard drinks   Drug use: No   Sexual activity: Not Currently    Birth control/protection: None  Other Topics Concern   Not on file  Social History Narrative   Lives at home with husband.   Right-handed.   2 cups caffeine per day.   Social Determinants of Health  Financial Resource Strain: Not on file  Food Insecurity: Not on file  Transportation Needs: Not on file  Physical Activity: Not on file  Stress: Not on file  Social Connections: Not on file  Intimate Partner Violence: Not on file   PHYSICAL EXAM  Vitals:   12/12/21 1239  BP: 114/81  Pulse: 73  Weight: 203 lb (92.1 kg)  Height: 5\' 4"  (1.626 m)    Body mass index is 34.84 kg/m.  Generalized: Well developed, in no acute distress   Neurological examination  Mentation: Alert oriented to time, place, history taking. Follows all commands speech and language fluent Cranial nerve II-XII: Pupils were equal round reactive to light. Extraocular movements were full, visual field were full on confrontational test. Facial sensation and strength were normal. Head turning and shoulder shrug were normal and symmetric. Motor: The motor testing reveals 5 over 5 strength of all 4 extremities. Good symmetric motor tone is noted throughout. Mild resting tremor to the right arm, in past in the left Sensory: Sensory testing is intact to soft touch on all 4 extremities. No evidence of extinction is noted.  Coordination: normal  Gait and station: Gait is normal, good arm swing, turns.  Tandem gait is slightly unsteady. Reflexes: Deep tendon reflexes are symmetric and normal bilaterally.   DIAGNOSTIC DATA (LABS, IMAGING, TESTING) - I reviewed patient records, labs, notes, testing and imaging myself where available.  Lab Results  Component Value Date   WBC 12.0 (H) 02/11/2020   HGB 15.1 (H) 02/11/2020   HCT 46.7 (H) 02/11/2020   MCV  94.3 02/11/2020   PLT 293 02/11/2020      Component Value Date/Time   NA 141 10/03/2020 1119   K 4.6 10/03/2020 1119   CL 106 10/03/2020 1119   CO2 22 10/03/2020 1119   GLUCOSE 91 10/03/2020 1119   GLUCOSE 106 (H) 02/11/2020 1731   BUN 13 10/03/2020 1119   CREATININE 1.03 (H) 10/03/2020 1119   CREATININE 1.22 (H) 02/23/2019 1412   CALCIUM 10.3 (H) 10/03/2020 1119   PROT 6.8 10/03/2020 1119   ALBUMIN 4.6 10/03/2020 1119   AST 11 10/03/2020 1119   ALT 10 10/03/2020 1119   ALKPHOS 94 10/03/2020 1119   BILITOT 0.5 10/03/2020 1119   GFRNONAA 60 10/03/2020 1119   GFRNONAA 61 04/01/2017 0847   GFRAA 70 10/03/2020 1119   GFRAA 71 04/01/2017 0847   Lab Results  Component Value Date   CHOL 174 05/13/2013   HDL 69 05/13/2013   LDLCALC 95 05/13/2013   TRIG 48 05/13/2013   CHOLHDL 2.5 05/13/2013   Lab Results  Component Value Date   HGBA1C 4.8 02/23/2019   Lab Results  Component Value Date   VITAMINB12 272 10/03/2020   Lab Results  Component Value Date   TSH 1.35 08/04/2019   ASSESSMENT AND PLAN 61 y.o. year old female  has a past medical history of Anxiety, Bipolar disorder ([place]), Brain tumor (benign) ([place]), Common migraine with intractable migraine (02/06/2021), Depression, HTN (hypertension), Mania ([place]), Meningioma ([place]), Migraine, and Tremor (02/06/2021). here with:  1.  Migraine headache   2.  History of left sided meningioma   3.  Gait instability   4.  Tremor  -Has had excellent benefit with Botox after 2 cycles, will continue this for migraine prevention -Continue Imitrex 100 mg as needed for acute headache -Continue close follow-up with psychiatry, recently had to have a dose increase of Risperdal due to hallucinations, did note mild resting tremor to right  arm on exam today, but Risperdal is needed for the time being for the mood issues -Chronic gait issues felt related to chronic use of lithium, stable -MRI of the brain was done in October 2021, no  significant abnormalities, no recurrence of meningioma -I will see her back in February for her Botox injections, Dr. Billey Gosling will be primary neurologist in the future, will have her schedule a 1 year follow-up, but I will check in during Botox visits  Butler Denmark, AGNP-C, DNP 12/12/2021, 12:51 PM [facility] [address], [place], [place] [address] [phone]

## 2021-12-12 ENCOUNTER — Ambulatory Visit: Payer: Medicare PPO | Admitting: Neurology

## 2021-12-12 ENCOUNTER — Encounter: Payer: Self-pay | Admitting: Neurology

## 2021-12-12 VITALS — BP 114/81 | HR 73 | Ht 64.0 in | Wt 203.0 lb

## 2021-12-12 DIAGNOSIS — R251 Tremor, unspecified: Secondary | ICD-10-CM | POA: Diagnosis not present

## 2021-12-12 DIAGNOSIS — G43019 Migraine without aura, intractable, without status migrainosus: Secondary | ICD-10-CM

## 2021-12-12 NOTE — Patient Instructions (Signed)
Will continue the Botox injections Use Imitrex as needed See you back in in Feb for Botox  Dr. Billey Gosling will be the new neurologist

## 2022-01-02 ENCOUNTER — Telehealth: Payer: Self-pay | Admitting: Neurology

## 2022-01-02 NOTE — Telephone Encounter (Signed)
[place] #[id number].

## 2022-01-02 NOTE — Telephone Encounter (Signed)
Patient's next Botox injection is 01/29/22. She has been receiving 200 units (155 injected) for migraine (G43.019). CPT [id number], [id number]. Patient's PA for Botox through [facility] was set to expire 12/22/21. Received letter from [facility] today stating that the PA has been automatically approved & extended through the new year (12/23/21-12/22/22).

## 2022-01-17 ENCOUNTER — Telehealth: Payer: Self-pay | Admitting: Neurology

## 2022-01-17 ENCOUNTER — Other Ambulatory Visit: Payer: Self-pay | Admitting: Neurology

## 2022-01-17 MED ORDER — NURTEC 75 MG PO TBDP: ORAL_TABLET | ORAL | 4 refills | Status: DC

## 2022-01-17 NOTE — Telephone Encounter (Signed)
Pt called and LVM stating that she is having headaches in between Botox treatments and would like to know what she can do. Please advise.

## 2022-01-17 NOTE — Telephone Encounter (Addendum)
I discussed verbally with Judson Roch and she agreed to try the pt on nurtec 75 mg. 1 tablet at the onset of a headache/migraine, no more than 1 tablet in a 24 hour period. # 10 for a 30 day supply. I discussed with the pt and she is agreeable to this. Medication has [facility] to [facility] [place].   I advised the med may need a PA and I have submitted on proactively through [facility]. Your information has been submitted to [facility]. [employer] will review the request and will issue a decision, typically within 3-7 days from your submission. You can check the updated outcome later by reopening this request.  If [employer] has not responded in 3-7 days or if you have any questions about your ePA request, please contact [employer] at [phone]. If you think there may be a problem with your PA request, use our live chat feature at the bottom right.  For [place] requests, please call [phone].

## 2022-01-21 NOTE — Telephone Encounter (Signed)
I received a partial approval notification through [facility]. I called [employer] for more information on this, spoke with [place] who confirmed the PA was approved for 10 tablets for a 30 day supply.  Authorization ID [id number] Case ID [id number].  Nothing further needed on this.

## 2022-01-21 NOTE — Telephone Encounter (Signed)
I spoke to the patient and provided her with the approval information. She contact the pharmacy for the medication.

## 2022-01-21 NOTE — Telephone Encounter (Signed)
Pt called and LVM wanting to know what the update is on her Nurtec. Please advise.

## 2022-01-29 ENCOUNTER — Encounter: Payer: Self-pay | Admitting: Neurology

## 2022-01-29 ENCOUNTER — Ambulatory Visit: Payer: Medicare PPO | Admitting: Neurology

## 2022-01-29 VITALS — BP 133/89 | HR 79 | Ht 64.0 in | Wt 203.0 lb

## 2022-01-29 DIAGNOSIS — G43019 Migraine without aura, intractable, without status migrainosus: Secondary | ICD-10-CM

## 2022-01-29 NOTE — Procedures (Signed)
° ° ° °  BOTOX PROCEDURE NOTE FOR MIGRAINE HEADACHE   HISTORY: [name] is here today for her third Botox injection for migraine headache.  She has done excellent thus far.  She has had about 90% improvement in her headaches.  She does note a wearing off effect about 2 weeks before the Botox.  She has Nurtec to take for rescue.  Description of procedure:  The patient was placed in a sitting position. The standard protocol was used for Botox as follows, with 5 units of Botox injected at each site:   -Procerus muscle, midline injection  -Corrugator muscle, bilateral injection  -Frontalis muscle, bilateral injection, with 2 sites each side, medial injection was performed in the upper one third of the frontalis muscle, in the region vertical from the medial inferior edge of the superior orbital rim. The lateral injection was again in the upper one third of the forehead vertically above the lateral limbus of the cornea, 1.5 cm lateral to the medial injection site.  -Temporalis muscle injection, 4 sites, bilaterally. The first injection was 3 cm above the tragus of the ear, second injection site was 1.5 cm to 3 cm up from the first injection site in line with the tragus of the ear. The third injection site was 1.5-3 cm forward between the first 2 injection sites. The fourth injection site was 1.5 cm posterior to the second injection site.  -Occipitalis muscle injection, 3 sites, bilaterally. The first injection was done one half way between the occipital protuberance and the tip of the mastoid process behind the ear. The second injection site was done lateral and superior to the first, 1 fingerbreadth from the first injection. The third injection site was 1 fingerbreadth superiorly and medially from the first injection site.  -Cervical paraspinal muscle injection, 2 sites, bilateral, the first injection site was 1 cm from the midline of the cervical spine, 3 cm inferior to the lower border of the  occipital protuberance. The second injection site was 1.5 cm superiorly and laterally to the first injection site.  -Trapezius muscle injection was performed at 3 sites, bilaterally. The first injection site was in the upper trapezius muscle halfway between the inflection point of the neck, and the acromion. The second injection site was one half way between the acromion and the first injection site. The third injection was done between the first injection site and the inflection point of the neck.   A 200 unit bottle of Botox was used, 155 units were injected, the rest of the Botox was wasted. The patient tolerated the procedure well, there were no complications of the above procedure.  Botox NDC 0086-7619-50 Lot number [id number] Expiration date 08/2024 B/B

## 2022-01-29 NOTE — Progress Notes (Signed)
Botox 200 units [id number] [id number] 08/2024 B/B

## 2022-02-04 ENCOUNTER — Telehealth: Payer: Self-pay | Admitting: Neurology

## 2022-02-04 NOTE — Telephone Encounter (Signed)
At 3:48 pt left a vm stating the time before last and on her last botox injection she has had a head cold.  Pt asking if this is normal and if so how long will it last.

## 2022-02-05 NOTE — Telephone Encounter (Signed)
I called patient. I discussed this with her. She has not gotten in touch with her PCP but will do so. She feels marginally better since this AM with less nasal congestion.

## 2022-02-05 NOTE — Telephone Encounter (Signed)
Agree with nurses assessment, could be side effect from Botox but not common, but I did talk with Dr. Jaynee Eagles she has not experienced this before. Weigh risk benefit in the future. I think best to treat symptoms with Tylenol. Next time we could give a Toradol shot before Botox to see if any help with symptoms?

## 2022-02-05 NOTE — Telephone Encounter (Signed)
I called patient. She reports that following the past two botox appointments she has come down with symptoms consistent with a head cold. She reports sore throat, nasal congestion & discharge, but denies fever. She tested negative twice for COVID. She has not followed up with her PCP. She read that botox can cause flu like symptoms. Her recent symptoms began right after her 01/29/22 appointment. I encouraged her to follow up with her PCP but that I would also let Judson Roch, NP know. I will call patient back with any further advice. Pt verbalized understanding.  Per [employer] regarding botox's adverse events: "Respiratory: Flu-like symptoms (3% to 10%), pharyngitis (3% to 10%) Miscellaneous: Fever (3% to 10%)"

## 2022-02-10 ENCOUNTER — Other Ambulatory Visit (HOSPITAL_COMMUNITY): Payer: Self-pay | Admitting: Psychiatry

## 2022-02-10 DIAGNOSIS — F319 Bipolar disorder, unspecified: Secondary | ICD-10-CM

## 2022-02-17 ENCOUNTER — Encounter: Payer: Self-pay | Admitting: Emergency Medicine

## 2022-02-17 ENCOUNTER — Telehealth: Payer: Medicare PPO | Admitting: Emergency Medicine

## 2022-02-17 DIAGNOSIS — L089 Local infection of the skin and subcutaneous tissue, unspecified: Secondary | ICD-10-CM | POA: Diagnosis not present

## 2022-02-17 MED ORDER — MUPIROCIN 2 % EX OINT: 1.0000 "application " | TOPICAL_OINTMENT | Freq: Two times a day (BID) | CUTANEOUS | 0 refills | Status: DC

## 2022-02-17 NOTE — Progress Notes (Signed)
I have spent 5 minutes in review of e-visit questionnaire, review and updating patient chart, medical decision making and response to patient.   Rashema Seawright, PA-C    

## 2022-02-17 NOTE — Progress Notes (Signed)
E Visit for Sore  We are sorry that you are not feeling well. Here is how we plan to help!  Hi [place], it sounds like you may have an abscess or impetigo.  Normally core sores present around the mouth, and not by the nostril.  I will treat you for possible infection, and if symptoms do not improve over the next couple of days please seek in person evaluation.    Topical mupiricin  HOME CARE:  Wash with warm water and mild soap Monitor for signs of worsening infection such as increased redness, swelling, pain, drainage, odor, fever, nausea, vomiting, etc... If you experience worsening symptoms, or symptoms do not improve with prescribed medications please seek in person evaluation   MAKE SURE YOU:  Understand these instructions. Will watch your condition. Will get help right away if you are not doing well or get worse.  Thank you for choosing an e-visit.  Your e-visit answers were reviewed by a board certified advanced clinical practitioner to complete your personal care plan. Depending upon the condition, your plan could have included both over the counter or prescription medications.  Please review your pharmacy choice. Make sure the pharmacy is open so you can pick up prescription now. If there is a problem, you may contact your provider through [employer] and have the prescription routed to another pharmacy.  Your safety is important to [place]. If you have drug allergies check your prescription carefully.   For the next 24 hours you can use MyChart to ask questions about today's visit, request a non-urgent call back, or ask for a work or school excuse. You will get an email in the next two days asking about your experience. I hope that your e-visit has been valuable and will speed your recovery.

## 2022-03-01 ENCOUNTER — Other Ambulatory Visit: Payer: Self-pay

## 2022-03-01 ENCOUNTER — Emergency Department (HOSPITAL_COMMUNITY)
Admission: EM | Admit: 2022-03-01 | Discharge: 2022-03-01 | Disposition: A | Discharge status: Home / Self Care | Payer: Medicare PPO | Attending: Student | Admitting: Student

## 2022-03-01 ENCOUNTER — Encounter (HOSPITAL_COMMUNITY): Payer: Self-pay | Admitting: *Deleted

## 2022-03-01 ENCOUNTER — Telehealth: Payer: Self-pay | Admitting: Neurology

## 2022-03-01 ENCOUNTER — Emergency Department (HOSPITAL_COMMUNITY): Payer: Medicare PPO

## 2022-03-01 DIAGNOSIS — I10 Essential (primary) hypertension: Secondary | ICD-10-CM | POA: Diagnosis not present

## 2022-03-01 DIAGNOSIS — R0602 Shortness of breath: Secondary | ICD-10-CM | POA: Diagnosis not present

## 2022-03-01 DIAGNOSIS — H9201 Otalgia, right ear: Secondary | ICD-10-CM | POA: Insufficient documentation

## 2022-03-01 DIAGNOSIS — Z79899 Other long term (current) drug therapy: Secondary | ICD-10-CM | POA: Diagnosis not present

## 2022-03-01 DIAGNOSIS — R079 Chest pain, unspecified: Secondary | ICD-10-CM | POA: Diagnosis not present

## 2022-03-01 LAB — CBC WITH DIFFERENTIAL/PLATELET
Abs Immature Granulocytes: 0.02 10*3/uL (ref 0.00–0.07)
Basophils Absolute: 0 10*3/uL (ref 0.0–0.1)
Basophils Relative: 1 %
Eosinophils Absolute: 0.2 10*3/uL (ref 0.0–0.5)
Eosinophils Relative: 2 %
HCT: 42.9 % (ref 36.0–46.0)
Hemoglobin: 14.3 g/dL (ref 12.0–15.0)
Immature Granulocytes: 0 %
Lymphocytes Relative: 22 %
Lymphs Abs: 1.4 10*3/uL (ref 0.7–4.0)
MCH: 30.6 pg (ref 26.0–34.0)
MCHC: 33.3 g/dL (ref 30.0–36.0)
MCV: 91.9 fL (ref 80.0–100.0)
Monocytes Absolute: 0.6 10*3/uL (ref 0.1–1.0)
Monocytes Relative: 10 %
Neutro Abs: 4.1 10*3/uL (ref 1.7–7.7)
Neutrophils Relative %: 65 %
Platelets: 229 10*3/uL (ref 150–400)
RBC: 4.67 MIL/uL (ref 3.87–5.11)
RDW: 12.1 % (ref 11.5–15.5)
WBC: 6.4 10*3/uL (ref 4.0–10.5)
nRBC: 0 % (ref 0.0–0.2)

## 2022-03-01 LAB — COMPREHENSIVE METABOLIC PANEL
ALT: 14 U/L (ref 0–44)
AST: 15 U/L (ref 15–41)
Albumin: 3.7 g/dL (ref 3.5–5.0)
Alkaline Phosphatase: 84 U/L (ref 38–126)
Anion gap: 8 (ref 5–15)
BUN: 16 mg/dL (ref 6–20)
CO2: 24 mmol/L (ref 22–32)
Calcium: 9.1 mg/dL (ref 8.9–10.3)
Chloride: 107 mmol/L (ref 98–111)
Creatinine, Ser: 1.02 mg/dL — ABNORMAL HIGH (ref 0.44–1.00)
GFR, Estimated: >60 mL/min (ref >60)
Glucose, Bld: 90 mg/dL (ref 70–99)
Potassium: 3.7 mmol/L (ref 3.5–5.1)
Sodium: 139 mmol/L (ref 135–145)
Total Bilirubin: 0.7 mg/dL (ref 0.3–1.2)
Total Protein: 6.6 g/dL (ref 6.5–8.1)

## 2022-03-01 LAB — TROPONIN I (HIGH SENSITIVITY): Troponin I (High Sensitivity): <2 ng/L (ref <18)

## 2022-03-01 NOTE — ED Provider Notes (Signed)
[facility] EMERGENCY DEPARTMENT Provider Note  CSN: [id number] Arrival date & time: 03/01/22 4010  Chief Complaint(s) Ear Pain  HPI [name] is a 61 y.o. female with PMH meningioma, HTN, intractable migraines treated with Botox injections who presents emergency department for evaluation of right-sided ear pain and chest pain.  Patient states that 1 week ago she had a 3 to 4-minute episode of substernal crushing chest pain with associated shortness of breath.  She states that this self resolved with no medications and she has not had any additional episodes since.  She presents to the emergency department today primarily for right-sided ear pain that she describes as "electric shocks" coming from the tragus that radiate down the jaw, down the neck and to the occiput.  No specific triggers.  She recently completed CPR training and is concerned that her chest pain may be related to her ear pain today.  Of note, the patient does receive Botox injections in this area.  Last injections on 01/29/2022  HPI  Past Medical History Past Medical History:  Diagnosis Date   Anxiety    Bipolar disorder [facility])    Brain tumor (benign) ([place])    Common migraine with intractable migraine 02/06/2021   Depression    HTN (hypertension)    Mania ([place])    Meningioma ([facility])    Migraine    Tremor 02/06/2021   Lithium induced   Patient Active Problem List   Diagnosis Date Noted   Common migraine with intractable migraine 02/06/2021   Tremor 02/06/2021   DOE (dyspnea on exertion) 08/04/2019   Chronic migraine 11/06/2016   Hx of resection of meningioma 10/07/2016   Bipolar 1 disorder, depressed, moderate ([place]) 11/14/2012    Class: Chronic   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 Authorizing Provider  amLODipine (NORVASC) 2.5 MG tablet  Take 2.5 mg by mouth daily. 02/16/20   [provider]  buPROPion (WELLBUTRIN XL) 300 MG 24 hr tablet Take 1 tablet (300 mg total) by mouth daily. TAKE ONE TABLET BY MOUTH EVERY MORINING FOR DEPRESSION 11/30/21   Arfeen, Arlyce Harman, MD  clonazePAM (KLONOPIN) 1 MG tablet Take 1 tablet (1 mg total) by mouth at bedtime. 11/30/21   Arfeen, Arlyce Harman, MD  hydrOXYzine (ATARAX) 10 MG tablet Take one to two tab as needed at bedtime. 11/30/21   Arfeen, Arlyce Harman, MD  metoprolol succinate (TOPROL-XL) 50 MG 24 hr tablet Take 100 mg by mouth daily. 04/18/21   [provider]  mupirocin ointment (BACTROBAN) 2 % Apply 1 application topically 2 (two) times daily. 02/17/22   Wurst, [place], PA-C  prochlorperazine (COMPAZINE) 10 MG tablet Take 1 tablet (10 mg total) by mouth 2 (two) times daily as needed for nausea or vomiting. 04/19/21   Davonna Belling, MD  Rimegepant Sulfate (NURTEC) 75 MG TBDP Take 1 tablet at the onset of a headache/migraine. No more than 1 tablet in a 24 hour period. 01/17/22   Suzzanne Cloud, NP  risperiDONE (RISPERDAL) 1 MG tablet Take 1/2 tab in am and 1/-1/2 tablets (1.5 mg total dose) at bedtime. 12/03/21   Arfeen, Arlyce Harman, MD  SUMAtriptan (IMITREX) 100 MG tablet Take 1 tablet (100 mg total) by mouth once as needed for up to 1 dose for migraine. May repeat in 2 hours if headache persists or recurs. 10/25/21  Ward Givens, NP                                                                                                                                    Past Surgical History Past Surgical History:  Procedure Laterality Date   ABDOMINAL HYSTERECTOMY     BRAIN SURGERY     breast tumor removal     INCONTINENCE SURGERY     INTERSTIM IMPLANT PLACEMENT     KNEE SURGERY     right x 2   PLANTAR FASCIECTOMY Left 03/05/16   TUBAL LIGATION     Family History Family History  Problem Relation Age of Onset   Alcohol abuse Father    Diabetes Father    Mental retardation Father    Drug  abuse Brother    Mental illness Mother    Breast cancer Mother    Dementia Neg Hx    ADD / ADHD Neg Hx    Anxiety disorder Neg Hx    Bipolar disorder Neg Hx    Depression Neg Hx    OCD Neg Hx    Paranoid behavior Neg Hx    Schizophrenia Neg Hx    Seizures Neg Hx    Sexual abuse Neg Hx    Physical abuse Neg Hx    Suicidality Neg Hx     Social History Social History   Tobacco Use   Smoking status: Never   Smokeless tobacco: Never  Vaping Use   Vaping Use: Never used  Substance Use Topics   Alcohol use: No    Alcohol/week: 0.0 standard drinks   Drug use: No   Allergies Gabapentin, Oxcarbazepine, Tegretol [carbamazepine], Codeine, Divalproex sodium, Erythromycin, Phenytoin sodium extended, and Phenytoin sodium extended  Review of Systems Review of Systems  HENT:  Positive for ear pain.   Cardiovascular:  Positive for chest pain.   Physical Exam Vital Signs  I have reviewed the triage vital signs BP 112/80 (BP Location: Left Arm)    Pulse 79    Temp 98.4 F (36.9 C) (Oral)    Resp 18    Ht '5\' 4"'$  (1.626 m)    Wt 92.1 kg    SpO2 97%    BMI 34.84 kg/m   Physical Exam Vitals and nursing note reviewed.  Constitutional:      General: She is not in acute distress.    Appearance: She is well-developed.  HENT:     Head: Normocephalic and atraumatic.     Right Ear: Tympanic membrane normal.     Left Ear: Tympanic membrane normal.  Eyes:     Conjunctiva/sclera: Conjunctivae normal.  Cardiovascular:     Rate and Rhythm: Normal rate and regular rhythm.     Heart sounds: No murmur heard. Pulmonary:     Effort: Pulmonary effort is normal. No respiratory distress.     Breath sounds: Normal breath sounds.  Abdominal:     Palpations:  Abdomen is soft.     Tenderness: There is no abdominal tenderness.  Musculoskeletal:        General: No swelling.     Cervical back: Neck supple.  Skin:    General: Skin is warm and dry.     Capillary Refill: Capillary refill takes less  than 2 seconds.  Neurological:     Mental Status: She is alert.     Cranial Nerves: No cranial nerve deficit.     Sensory: No sensory deficit.     Motor: No weakness.  Psychiatric:        Mood and Affect: Mood normal.    ED Results and Treatments Labs (all labs ordered are listed, but only abnormal results are displayed) Labs Reviewed - No data to display                                                                                                                        Radiology No results found.  Pertinent labs & imaging results that were available during my care of the patient were reviewed by me and considered in my medical decision making (see MDM for details).  Medications Ordered in ED Medications - No data to display                                                                                                                                   Procedures Procedures  (including critical care time)  Medical Decision Making / ED Course   This patient presents to the ED for concern of ear pain, chest pain, this involves an extensive number of treatment options, and is a complaint that carries with it a high risk of complications and morbidity.  The differential diagnosis includes trigeminal neuralgia, Botox injection complication, neuropathic pain, ACS, pneumonia  MDM: Patient seen emergency department for evaluation of ear pain and chest pain.  Physical exam largely unremarkable.  Laboratory evaluation from a chest pain standpoint is unremarkable including a negative high-sensitivity troponin.  She does have inverted T waves that appear to be new in the lateral leads but with no persistent pain, no shortness of breath and negative troponins, these are safe to be evaluated in the outpatient setting.  In regards to her ear pain, she has no evidence of ear infection on exam, and her pain seems to track in the distribution of the facial nerve.  I attempted to contact  the office  where the patient had these Botox injections performed, but they are closed on Friday.  I was able to have a discussion with the patient's outpatient nurse practitioner who administered the Botox via chat and she will help me follow this outside the hospital.  Low suspicion that this is secondary to Botox injections as this happened 1 month ago.  Unfortunately, patient has anaphylaxis to both oxcarbazepine and gabapentin and thus we shared decision making to come up with a plan for a very short course of opioid pain medication to bridge the patient to outpatient neurologic follow-up where they can further investigate this pain and find an agent that may be useful in the setting of possible trigeminal neuralgia.  Patient safe from a chest pain standpoint for outpatient follow-up and patient was discharged with neurologic outpatient follow-up.  Additional history obtained: -Additional history obtained from husband -External records from outside source obtained and reviewed including: Chart review including previous notes, labs, imaging, consultation notes   Lab Tests: -I ordered, reviewed, and interpreted labs.   The pertinent results include:   Labs Reviewed - No data to display    EKG   EKG Interpretation  Date/Time:  Friday March 01 2022 08:30:16 EST Ventricular Rate:  67 PR Interval:  161 QRS Duration: 86 QT Interval:  381 QTC Calculation: 403 R Axis:   35 Text Interpretation: Sinus rhythm Nonspecific T abnormalities, anterior leads Confirmed by Melvin ([id number]) on 03/01/2022 9:06:41 AM         Imaging Studies ordered: I ordered imaging studies including CXR I independently visualized and interpreted imaging. I agree with the radiologist interpretation   Medicines ordered and prescription drug management: No orders of the defined types were placed in this encounter.   -I have reviewed the patients home medicines and have made adjustments as needed  Critical  interventions none  Consultations Obtained: I requested consultation with the neurology,  and discussed lab and imaging findings as well as pertinent plan - they recommend: outpatient follow up   Cardiac Monitoring: The patient was maintained on a cardiac monitor.  I personally viewed and interpreted the cardiac monitored which showed an underlying rhythm of: NSR  Social Determinants of Health:  Factors impacting patients care include: none   Reevaluation: After the interventions noted above, I reevaluated the patient and found that they have :improved  Co morbidities that complicate the patient evaluation  Past Medical History:  Diagnosis Date   Anxiety    Bipolar disorder ([place])    Brain tumor (benign) ([place])    Common migraine with intractable migraine 02/06/2021   Depression    HTN (hypertension)    Mania ([place])    Meningioma ([place])    Migraine    Tremor 02/06/2021   Lithium induced      Dispostion: I considered admission for this patient, but with a heart score of 1, likely trigeminal neuralgia patient is safe for outpatient follow-up.     Final Clinical Impression(s) / ED Diagnoses Final diagnoses:  None     '@PCDICTATION'$ @    Teressa Lower, MD 03/01/22 1435

## 2022-03-01 NOTE — ED Triage Notes (Signed)
Pt c/o pain in front of her right ear that radiates to her right jaw and to the back right side of her head x 1 week.  ?

## 2022-03-01 NOTE — Discharge Instructions (Signed)
You were seen in the emergency department for evaluation of right ear pain.  The pain that you are describing sounds neuropathic and is tracking along the facial nerve.  This may be a condition called trigeminal neuralgia but you have severe allergies to the first-line medications for this.  At this point we will provide a very short prescription for pain medication to be used until you can see your neurologist.  Please continue to take ibuprofen as your first-line drug, but if it continues to breakthrough ibuprofen this will be available for you.  In the short-term, please see your primary care today for your scheduled appointment to ensure that they agree with this plan.  It is very important that you call your neurologist on Monday when their office opens and I will send a message to your neurologist as well. ?

## 2022-03-01 NOTE — Telephone Encounter (Signed)
Patient in the ER for Right ear pain, ER MD reached out, can we call her next week to check on her? She gets Botox, last was a little over a month ago.  ?

## 2022-03-04 NOTE — Telephone Encounter (Signed)
Her right-sided facial/ear pain has improved. However, she went ahead and scheduled an appt to establish care for that diagnosis (in case of a flare up). She already had a pending appt w/ Dr. Billey Gosling to transfer care from Dr. Jannifer Franklin (for her migraines). The appt has been moved to 03/26/22 at 2:30pm. She will check in at 2pm. ?

## 2022-03-07 ENCOUNTER — Telehealth (HOSPITAL_BASED_OUTPATIENT_CLINIC_OR_DEPARTMENT_OTHER): Payer: Medicare PPO | Admitting: Psychiatry

## 2022-03-07 ENCOUNTER — Encounter (HOSPITAL_COMMUNITY): Payer: Self-pay | Admitting: Psychiatry

## 2022-03-07 ENCOUNTER — Other Ambulatory Visit: Payer: Self-pay

## 2022-03-07 DIAGNOSIS — F411 Generalized anxiety disorder: Secondary | ICD-10-CM | POA: Diagnosis not present

## 2022-03-07 DIAGNOSIS — F319 Bipolar disorder, unspecified: Secondary | ICD-10-CM | POA: Diagnosis not present

## 2022-03-07 MED ORDER — BUPROPION HCL ER (XL) 300 MG PO TB24: 300.0000 mg | ORAL_TABLET | Freq: Every day | ORAL | 0 refills | Status: DC

## 2022-03-07 MED ORDER — CLONAZEPAM 1 MG PO TABS: 1.0000 mg | ORAL_TABLET | Freq: Every day | ORAL | 0 refills | Status: DC

## 2022-03-07 MED ORDER — RISPERIDONE 1 MG PO TABS: ORAL_TABLET | ORAL | 0 refills | Status: DC

## 2022-03-07 MED ORDER — HYDROXYZINE HCL 10 MG PO TABS: ORAL_TABLET | ORAL | 0 refills | Status: DC

## 2022-03-07 NOTE — Progress Notes (Signed)
Virtual Visit via Telephone Note ? ?I connected with [name] on 03/07/22 at 10:00 AM EDT by telephone and verified that I am speaking with the correct person using two identifiers. ? ?Location: ?Patient: home ?Provider: home office ?  ?I discussed the limitations, risks, security and privacy concerns of performing an evaluation and management service by telephone and the availability of in person appointments. I also discussed with the patient that there may be a patient responsible charge related to this service. The patient expressed understanding and agreed to proceed. ? ? ?History of Present Illness: ?Patient is evaluated by phone session.  We have increased Risperdal on the last visit.  Patient reported it helps her hallucination, suicidal thoughts and anger.  However she still feels paranoid and does not trust people around her.  She does not drive and does not go to public places.  Recently she was diagnosed with trigeminal neuralgia and now scheduled to see a neurologist Dr. Orlie Dakin at [facility] neurology.  She reported better relationship with her husband and denies any anger, mood swing or any highs or lows.  She admitted weight gain since taking the Risperdal.  However she also feels that Risperdal helps her psychiatric symptoms.  She has no tremors shakes or any EPS.  Her sleep is much improved.  She denies any panic attack.  Recently she had a blood work.  Her creatinine is stable. ? ? ?Past Psychiatric History: Reviewed. ?H/O bipolar disorder and multiple inpatient treatment.  Last inpatient in September 2014 for suicidal thinking, hallucination as playing with guns and superficially cut her wrist with a needle.  H/O mania and psychosis. Tried Zyprexa Seroquel but stopped due to weight gain.  Tegretol caused increased paranoia confusion and required inpatient.   ? ?Recent Results (from the past 2160 hour(s))  ?Troponin I (High Sensitivity)     Status: None  ? Collection Time: 03/01/22  8:33  AM  ?Result Value Ref Range  ? Troponin I (High Sensitivity) <2 <18 ng/L  ?  Comment: (NOTE) ?Elevated high sensitivity troponin I (hsTnI) values and significant  ?changes across serial measurements may suggest ACS but many other  ?chronic and acute conditions are known to elevate hsTnI results.  ?Refer to the "Links" section for chest pain algorithms and additional  ?guidance. ?Performed at [facility], [address], [place], [place] [address] ?  ?CBC with Differential     Status: None  ? Collection Time: 03/01/22  8:33 AM  ?Result Value Ref Range  ? WBC 6.4 4.0 - 10.5 K/uL  ? RBC 4.67 3.87 - 5.11 MIL/uL  ? Hemoglobin 14.3 12.0 - 15.0 g/dL  ? HCT 42.9 36.0 - 46.0 %  ? MCV 91.9 80.0 - 100.0 fL  ? MCH 30.6 26.0 - 34.0 pg  ? MCHC 33.3 30.0 - 36.0 g/dL  ? RDW 12.1 11.5 - 15.5 %  ? Platelets 229 150 - 400 K/uL  ? nRBC 0.0 0.0 - 0.2 %  ? Neutrophils Relative % 65 %  ? Neutro Abs 4.1 1.7 - 7.7 K/uL  ? Lymphocytes Relative 22 %  ? Lymphs Abs 1.4 0.7 - 4.0 K/uL  ? Monocytes Relative 10 %  ? Monocytes Absolute 0.6 0.1 - 1.0 K/uL  ? Eosinophils Relative 2 %  ? Eosinophils Absolute 0.2 0.0 - 0.5 K/uL  ? Basophils Relative 1 %  ? Basophils Absolute 0.0 0.0 - 0.1 K/uL  ? Immature Granulocytes 0 %  ? Abs Immature Granulocytes 0.02 0.00 -  0.07 K/uL  ?  Comment: Performed at [facility], [address], [place], [place] [address]  ?Comprehensive metabolic panel     Status: Abnormal  ? Collection Time: 03/01/22  8:33 AM  ?Result Value Ref Range  ? Sodium 139 135 - 145 mmol/L  ? Potassium 3.7 3.5 - 5.1 mmol/L  ? Chloride 107 98 - 111 mmol/L  ? CO2 24 22 - 32 mmol/L  ? Glucose, Bld 90 70 - 99 mg/dL  ?  Comment: Glucose reference range applies only to samples taken after fasting for at least 8 hours.  ? BUN 16 6 - 20 mg/dL  ? Creatinine, Ser 1.02 (H) 0.44 - 1.00 mg/dL  ? Calcium 9.1 8.9 - 10.3 mg/dL  ? Total Protein 6.6 6.5 - 8.1 g/dL  ? Albumin 3.7 3.5 - 5.0 g/dL  ? AST 15 15 - 41 U/L  ? ALT 14 0 - 44 U/L  ? Alkaline Phosphatase 84  38 - 126 U/L  ? Total Bilirubin 0.7 0.3 - 1.2 mg/dL  ? GFR, Estimated >60 >60 mL/min  ?  Comment: (NOTE) ?Calculated using the CKD-EPI Creatinine Equation (2021) ?  ? Anion gap 8 5 - 15  ?  Comment: Performed at [facility], [address], [place], [place] [address]  ?  ? ?Psychiatric Specialty Exam: ?Physical Exam  ?Review of Systems  ?Weight 203 lb (92.1 kg).There is no height or weight on file to calculate BMI.  ?General Appearance: NA  ?Eye Contact:  NA  ?Speech:  Slow  ?Volume:  Normal  ?Mood:  Anxious  ?Affect:  NA  ?Thought Process:  Descriptions of Associations: Intact  ?Orientation:  Full (Time, Place, and Person)  ?Thought Content:  Paranoid Ideation  ?Suicidal Thoughts:  No  ?Homicidal Thoughts:  No  ?Memory:  Immediate;   Good ?Recent;   Fair ?Remote;   Fair  ?Judgement:  Fair  ?Insight:  Shallow  ?Psychomotor Activity:  NA  ?Concentration:  Concentration: Fair and Attention Span: Fair  ?Recall:  Good  ?Fund of Knowledge:  Good  ?Language:  Good  ?Akathisia:  No  ?Handed:  Right  ?AIMS (if indicated):     ?Assets:  Communication Skills ?Desire for Improvement ?Housing ?Social Support  ?ADL's:  Intact  ?Cognition:  WNL  ?Sleep:   ok  ? ? ? ? ?Assessment and Plan: ?Bipolar disorder type I.  Anxiety. ? ?I reviewed blood work results.  Patient doing better with extra dose of her Risperdal.  She is taking 0.5 mg in the morning and 1.5 mg at bedtime.  We talk about watching her calorie intake and start exercising.  She reluctant to try a different medication since Risperdal helped her visual and auditory hallucination.  She is trying to see a therapist however her insurance has been an issue and most of the therapists in her area and do not accept her insurance.  I recommend she should call the insurance company directly to find out if any therapist is in her network.  She agreed to give them a call.  We talk about not changing the medication since she feels stable.  I encouraged to keep appointment with a  neurologist for her trigeminal neuralgia.  We will continue Risperdal 0.5 mg in the morning and 1.5 mg at bedtime, Klonopin 1 mg at bedtime, Wellbutrin XL 300 mg in the morning and hydroxyzine up to 20 mg at bedtime.  Recommended to call us back if she has any question or any concern.  Follow-up in 2 months. ? ?Follow Up Instructions: ? ?  ?I discussed the assessment and treatment plan with the patient. The patient was provided an opportunity to ask questions and all were answered. The patient agreed with the plan and demonstrated an understanding of the instructions. ?  ?The patient was advised to call back or seek an in-person evaluation if the symptoms worsen or if the condition fails to improve as anticipated. ? ?I provided 16 minutes of non-face-to-face time during this encounter. ? ? ?[name], MD  ?

## 2022-03-19 ENCOUNTER — Telehealth: Payer: Self-pay | Admitting: Neurology

## 2022-03-19 NOTE — Telephone Encounter (Signed)
Patient wanted to have her Consult with Dr. Billey Gosling and botox the same day. She was seen in the ED which prompted the Consult so after speaking with Judson Roch she will review and reach out to the patient to determine if the 4/4 visit is needed. The patient said she would check her mychart and please advise if the 4/4 visit can be cancelled for Dr. Billey Gosling. Thank you ?

## 2022-03-19 NOTE — Telephone Encounter (Signed)
I called the patient, has appointment with Dr. Billey Gosling 03/26/22, reported right sided ear pain, down to her jaw, down her neck, like electric type pain. Was shock like, would go away if she put her hand there. Lasted 2-3 days. She thought ear infection, but wasn't. PCP felt she had trigeminal neuralgia. Had total of about 6 days of pain. She had gotten Botox 01/29/22. Wants to see if Botox and office visit can be done with Dr. Billey Gosling on the same day for transportation issues. I will need to check with office billing about if both can be done on the same day. ?

## 2022-03-20 ENCOUNTER — Encounter: Payer: Self-pay | Admitting: Psychiatry

## 2022-03-20 ENCOUNTER — Encounter: Payer: Self-pay | Admitting: Neurology

## 2022-03-20 NOTE — Telephone Encounter (Signed)
I fixed the appointments to both be with Dr. Billey Gosling on May 10 and sent the patient a mychart message to let her know. ?

## 2022-03-26 ENCOUNTER — Institutional Professional Consult (permissible substitution): Payer: Medicare PPO | Admitting: Psychiatry

## 2022-03-29 ENCOUNTER — Other Ambulatory Visit (HOSPITAL_COMMUNITY): Payer: Self-pay | Admitting: Psychiatry

## 2022-03-29 DIAGNOSIS — F411 Generalized anxiety disorder: Secondary | ICD-10-CM

## 2022-05-01 ENCOUNTER — Encounter: Payer: Self-pay | Admitting: Psychiatry

## 2022-05-01 ENCOUNTER — Ambulatory Visit: Payer: Medicare PPO | Admitting: Neurology

## 2022-05-01 ENCOUNTER — Ambulatory Visit: Payer: Medicare PPO | Admitting: Psychiatry

## 2022-05-01 ENCOUNTER — Ambulatory Visit (INDEPENDENT_AMBULATORY_CARE_PROVIDER_SITE_OTHER): Payer: Medicare PPO | Admitting: Psychiatry

## 2022-05-01 VITALS — BP 121/85 | HR 82 | Ht 64.0 in | Wt 208.8 lb

## 2022-05-01 DIAGNOSIS — G500 Trigeminal neuralgia: Secondary | ICD-10-CM

## 2022-05-01 DIAGNOSIS — G43719 Chronic migraine without aura, intractable, without status migrainosus: Secondary | ICD-10-CM | POA: Diagnosis not present

## 2022-05-01 NOTE — Progress Notes (Signed)
? ?Referring:  ?Earney Mallet, MD ?[address] ?[place] [address] ? ?PCP: ?Earney Mallet, MD ? ?Neurology was asked to evaluate [name], a 61 year old female for a chief complaint of headaches.  Our recommendations of care will be communicated by shared medical record.   ? ?CC:  ear pain ? ?History provided from self, husband [name] ? ?HPI:  ?Medical co-morbidities: bipolar disorder, meningioma s/p resection, migraines ? ?The patient presents for evaluation of right sided ear pain which began in early March. The pain was described as electrical shocking which radiated from the front of her ear down her right jaw and the side of her tongue. It also radiated down her throat. Pain would only last for seconds at a time, but occurred on and off for the entire day. She went to the emergency room where blood work was unremarkable. Pain remitted after 24 hours and has not returned. ? ?Last brain MRI was 09/2020, which showed stable postop changes from left craniotomy. [facility] 03/16/21 was stable. ? ?FACIAL PAIN FEATURES  ?Side: right ?Distribution: V3, right throat ?Any pain on side or back of head: no ?Character: electrical shock ?Duration: seconds ?Pain-free between episodes: yes ?Triggers: none ?Sensory abnormalities: none ?Tried Tegretol/Trileptal: oxcarbazepine - anaphylaxis ?Prior procedures/outcome: none ? ?She also has a history of migraine which she currently receives Botox for. Takes Nurtec for rescue. ? ?Prior Therapies                                 ?Gabapentin - anaphylaxis ?Oxcarbazepine - anaphylaxis ?Carbamazepine - nausea and vomiting ?Depakote - swelling ?Phenytoin - swelling, rash ?Aimovig ?Emgality - joint pain ?Metoprolol 100 mg daily ?Propranolol ?amlodipine ?Botox (for migraines) ?Excedrin ?Imitrex 50 mg PRN ?Nurtec 70 mg PRN ? ?LABS: ?CBC ?   ?Component Value Date/Time  ? WBC 6.4 03/01/2022 0833  ? RBC 4.67 03/01/2022 0833  ? HGB 14.3 03/01/2022 0833  ? HCT 42.9 03/01/2022 0833  ? PLT 229  03/01/2022 0833  ? MCV 91.9 03/01/2022 0833  ? MCH 30.6 03/01/2022 0833  ? MCHC 33.3 03/01/2022 0833  ? RDW 12.1 03/01/2022 0833  ? LYMPHSABS 1.4 03/01/2022 0833  ? MONOABS 0.6 03/01/2022 0833  ? EOSABS 0.2 03/01/2022 0833  ? BASOSABS 0.0 03/01/2022 6440  ? ? ?  Latest Ref Rng & Units 03/01/2022  ?  8:33 AM 10/03/2020  ? 11:19 AM 02/11/2020  ?  8:35 PM  ?CMP  ?Glucose 70 - 99 mg/dL 90   91     ?BUN 6 - 20 mg/dL 16   13     ?Creatinine 0.44 - 1.00 mg/dL 1.02   1.03     ?Sodium 135 - 145 mmol/L 139   141     ?Potassium 3.5 - 5.1 mmol/L 3.7   4.6     ?Chloride 98 - 111 mmol/L 107   106     ?CO2 22 - 32 mmol/L 24   22     ?Calcium 8.9 - 10.3 mg/dL 9.1   10.3     ?Total Protein 6.5 - 8.1 g/dL 6.6   6.8   7.5    ?Total Bilirubin 0.3 - 1.2 mg/dL 0.7   0.5   0.6    ?Alkaline Phos 38 - 126 U/L 84   94   87    ?AST 15 - 41 U/L '15   11   14    '$ ?ALT 0 -  44 U/L '14   10   15    '$ ? ? ? ?IMAGING:  ?[place] 03/16/21: ?No evidence of acute intracranial abnormality. ?  ?Stable postoperative changes from prior left craniotomy and ?meningioma resection. ? ?MRI brain 10/12/20: ?Enhancement underlying left craniotomy is likely postoperative in ?nature. No new or progressive nodular enhancement to suggest ?recurrent meningioma. ? ?Imaging independently reviewed on May 01, 2022  ? ?Current Outpatient Medications on File Prior to Visit  ?Medication Sig Dispense Refill  ? amLODipine (NORVASC) 2.5 MG tablet Take 2.5 mg by mouth daily.    ? buPROPion (WELLBUTRIN XL) 300 MG 24 hr tablet Take 1 tablet (300 mg total) by mouth daily. TAKE ONE TABLET BY MOUTH EVERY MORINING FOR DEPRESSION 90 tablet 0  ? clonazePAM (KLONOPIN) 1 MG tablet Take 1 tablet (1 mg total) by mouth at bedtime. 90 tablet 0  ? hydrOXYzine (ATARAX) 10 MG tablet Take one to two tab as needed at bedtime. 135 tablet 0  ? metoprolol succinate (TOPROL-XL) 50 MG 24 hr tablet Take 100 mg by mouth daily.    ? mupirocin ointment (BACTROBAN) 2 % Apply 1 application topically 2 (two) times daily.  22 g 0  ? prochlorperazine (COMPAZINE) 10 MG tablet Take 1 tablet (10 mg total) by mouth 2 (two) times daily as needed for nausea or vomiting. 10 tablet 0  ? Rimegepant Sulfate (NURTEC) 75 MG TBDP Take 1 tablet at the onset of a headache/migraine. No more than 1 tablet in a 24 hour period. 10 tablet 4  ? risperiDONE (RISPERDAL) 1 MG tablet Take 1/2 tab in am and 1/-1/2 tablets (1.5 mg total dose) at bedtime. 180 tablet 0  ? SUMAtriptan (IMITREX) 100 MG tablet Take 1 tablet (100 mg total) by mouth once as needed for up to 1 dose for migraine. May repeat in 2 hours if headache persists or recurs. 10 tablet 2  ? ?No current facility-administered medications on file prior to visit.  ? ? ? ?Allergies: ?Allergies  ?Allergen Reactions  ? Gabapentin Anaphylaxis  ? Oxcarbazepine Anaphylaxis  ? Tegretol [Carbamazepine] Nausea And Vomiting  ? Codeine Nausea Only  ? Divalproex Sodium Swelling  ? Erythromycin Nausea And Vomiting  ? Phenytoin Sodium Extended Swelling  ? Phenytoin Sodium Extended Rash  ? ? ?Family History: ?Family History  ?Problem Relation Age of Onset  ? Mental illness Mother   ? Breast cancer Mother   ? Alcohol abuse Father   ? Diabetes Father   ? Mental retardation Father   ? Drug abuse Brother   ? Dementia Neg Hx   ? ADD / ADHD Neg Hx   ? Anxiety disorder Neg Hx   ? Bipolar disorder Neg Hx   ? Depression Neg Hx   ? OCD Neg Hx   ? Paranoid behavior Neg Hx   ? Schizophrenia Neg Hx   ? Seizures Neg Hx   ? Sexual abuse Neg Hx   ? Physical abuse Neg Hx   ? Suicidality Neg Hx   ? ? ? ?Past Medical History: ?Past Medical History:  ?Diagnosis Date  ? Anxiety   ? Bipolar disorder ([place])   ? Brain tumor (benign) ([place])   ? Common migraine with intractable migraine 02/06/2021  ? Depression   ? HTN (hypertension)   ? Mania ([place])   ? Meningioma ([place])   ? Migraine   ? Tremor 02/06/2021  ? Lithium induced  ? ? ?Past Surgical History ?Past Surgical History:  ?Procedure Laterality Date  ? ABDOMINAL  HYSTERECTOMY    ? BRAIN SURGERY     ? breast tumor removal    ? INCONTINENCE SURGERY    ? INTERSTIM IMPLANT PLACEMENT    ? KNEE SURGERY    ? right x 2  ? PLANTAR FASCIECTOMY Left 03/05/16  ? TUBAL LIGATION    ? ? ?Social History: ?Social History  ? ?Tobacco Use  ? Smoking status: Never  ? Smokeless tobacco: Never  ?Vaping Use  ? Vaping Use: Never used  ?Substance Use Topics  ? Alcohol use: No  ?  Alcohol/week: 0.0 standard drinks  ? Drug use: No  ? ? ?ROS: ?Negative for fevers, chills. Positive for facial pain, migraines. All other systems reviewed and negative unless stated otherwise in HPI. ? ? ?Physical Exam:  ? ?Vital Signs: ?BP 121/85   Pulse 82   Ht '5\' 4"'$  (1.626 m)   Wt 208 lb 12.8 oz (94.7 kg)   BMI 35.84 kg/m?  ?GENERAL: well appearing,in no acute distress,alert ?SKIN:  Color, texture, turgor normal. No rashes or lesions ?HEAD:  Normocephalic/atraumatic. ?CV:  RRR ?RESP: Normal respiratory effort ?MSK: no tenderness to palpation over occiput, neck, or shoulders ? ?NEUROLOGICAL: ?Mental Status: Alert, oriented to person, place and time,Follows commands ?Cranial Nerves: PERRL, visual fields intact to confrontation, extraocular movements intact, facial sensation intact, no facial droop or ptosis, hearing grossly intact, no dysarthria ?Motor: muscle strength 5/5 both upper and lower extremities ?Reflexes: 2+ throughout ?Sensation: intact to light touch all 4 extremities ?Coordination: Finger-to- nose-finger intact bilaterally ?Gait: normal-based ? ? ?IMPRESSION: ?61 year old female with a history of bipolar disorder, meningioma s/p resection, migraines who presents for evaluation of right sided facial pain. 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 Will defer treatment or imaging at this time as she is currently pain free. Discussed that we would likely obtain MRI/MRA in the future if pain does  return. Would also consider CT soft tissue of the neck if she has recurrent electrical pain radiating down her throat. She has failed multiple nerve pain medications due to side effects and allergies. Could co

## 2022-05-01 NOTE — Progress Notes (Signed)
Botox- 200 units x 1 vial ?Lot: [id number] ?Expiration: 11/2024 ?[place]: [phone] ?  ?Bacteriostatic 0.9% Sodium Chloride- 56m total ?[id number]?Expiration: 01/2023 ?[place] [id number]?  ?Dx: G43.019 ?BB ?

## 2022-05-01 NOTE — Progress Notes (Signed)
[facility] ? ?BOTULINUM TOXIN INJECTION PROCEDURE NOTE ? ?Patient: [name] ?MRN: [id number] ? ?Indication: Chronic migraines  ? ?History: 61 year old female with a history of bipolar disorder, tremor, meningioma who follows in clinic for chronic migraines. She has done very well on Botox and has only had 1-2 headaches since her last session. Nurtec works well for rescue when she does get a headache. ? ?Last injection: 01/29/22 ? ?Technique: Informed consent was obtained and signed. 200 units onabotulinumtoxinA (Lot# [id number]; Expiration: 11/2024) were reconstituted using normal saline, to a concentration of 5 units per 0.31m. 155 units were injected using sterile technique across 31 sites as follows: corrugator 10, procerus 5 units, frontalis 20 units, temporalis 40 units, occipitalis 30 units, cervical paraspinal 20 units, trapezius 30 units. 45 units were wasted. Patient tolerated procedure without complication. ? ?Plan: ?-Continue Nurtec for rescue ?-Return for Botox in 3 months ? ?JAnderson MaltaChima ?05/01/22 ?2:15 PM ? ? ?

## 2022-05-08 ENCOUNTER — Telehealth (HOSPITAL_COMMUNITY): Payer: Self-pay | Admitting: *Deleted

## 2022-05-08 NOTE — Telephone Encounter (Signed)
Pt called stating that she needs to come off the Risperdal 1.5 mg due to 17 lb weight gain. Medication increased approximately 6 months ago. Pt next appointment is on 06/06/22. Please review and advise. ?

## 2022-05-09 NOTE — Telephone Encounter (Signed)
I reviewed her chart.  Her weight is 203 on her last appointment.  Not sure what is her weight now.  We have tried reducing the Risperdal but she started to have hallucination and paranoia.  I am reluctant to cut down the dose since she is doing better.  We need to monitor her weight closely because at [place] showing that she had lost weight.and on May 10 th she was 186 lbs.

## 2022-05-27 ENCOUNTER — Encounter: Payer: Self-pay | Admitting: Psychiatry

## 2022-05-27 ENCOUNTER — Other Ambulatory Visit: Payer: Self-pay | Admitting: Psychiatry

## 2022-05-27 MED ORDER — RIZATRIPTAN BENZOATE 10 MG PO TABS: 10.0000 mg | ORAL_TABLET | ORAL | 6 refills | Status: DC | PRN

## 2022-05-27 NOTE — Telephone Encounter (Signed)
I sent in an rx for rizatriptan to her pharmacy. She can use this as a second rescue med if needed

## 2022-06-02 ENCOUNTER — Other Ambulatory Visit (HOSPITAL_COMMUNITY): Payer: Self-pay | Admitting: Psychiatry

## 2022-06-02 DIAGNOSIS — F319 Bipolar disorder, unspecified: Secondary | ICD-10-CM

## 2022-06-06 ENCOUNTER — Encounter (HOSPITAL_COMMUNITY): Payer: Self-pay | Admitting: Psychiatry

## 2022-06-06 ENCOUNTER — Telehealth (HOSPITAL_BASED_OUTPATIENT_CLINIC_OR_DEPARTMENT_OTHER): Payer: Medicare PPO | Admitting: Psychiatry

## 2022-06-06 DIAGNOSIS — F319 Bipolar disorder, unspecified: Secondary | ICD-10-CM | POA: Diagnosis not present

## 2022-06-06 DIAGNOSIS — F411 Generalized anxiety disorder: Secondary | ICD-10-CM

## 2022-06-06 MED ORDER — CLONAZEPAM 1 MG PO TABS: 1.0000 mg | ORAL_TABLET | Freq: Every day | ORAL | 0 refills | Status: DC

## 2022-06-06 MED ORDER — HYDROXYZINE HCL 10 MG PO TABS: ORAL_TABLET | ORAL | 0 refills | Status: DC

## 2022-06-06 MED ORDER — RISPERIDONE 1 MG PO TABS: ORAL_TABLET | ORAL | 0 refills | Status: DC

## 2022-06-06 MED ORDER — BUPROPION HCL ER (XL) 300 MG PO TB24: 300.0000 mg | ORAL_TABLET | Freq: Every day | ORAL | 0 refills | Status: DC

## 2022-06-06 NOTE — Progress Notes (Signed)
Virtual Visit via Telephone Note  I connected with [name] on 06/06/22 at 10:20 AM EDT by telephone and verified that I am speaking with the correct person using two identifiers.  Location: Patient: Home Provider: Office   I discussed the limitations, risks, security and privacy concerns of performing an evaluation and management service by telephone and the availability of in person appointments. I also discussed with the patient that there may be a patient responsible charge related to this service. The patient expressed understanding and agreed to proceed.   History of Present Illness: Patient is evaluated by phone session.  She is concerned about her weight gain.  She gained another 5 pounds since the last visit.  Since Thanksgiving she had gained 18 pounds.  We have tried cutting down the Risperdal but she decided to have hallucination, paranoia and started feeling more depressed.  She is again requesting to cut down the Risperdal.  When asked about other methods of losing weight she admitted that she does not go outside, walking for exercise.  She does not have transportation to go to gym.  She is seen neurologist Dr. Ena Dawley for trigeminal neuralgia and receiving Botox.  She has migraine headaches and sometimes she do get flareup.  She still have residual paranoia as does not like among people and does not trust easily.  She like to stay in her home.  She had good support from her husband [place].  Her appetite is okay.  She is sleeping good.  Patient denies any tremors, shakes or any EPS.  Her primary care physician is in [place].  She told her labs are okay.  Her last creatinine which was done in March was 1.02.  Patient denies drinking or using any illegal substances.      Past Psychiatric History: Reviewed. H/O bipolar disorder and multiple inpatient treatment.  Last inpatient in September 2014 for suicidal thinking, hallucination as playing with guns and superficially cut her wrist with  a needle.  H/O mania and psychosis. Tried Zyprexa Seroquel but stopped due to weight gain.  Tegretol caused increased paranoia confusion and required inpatient.    Psychiatric Specialty Exam: Physical Exam  Review of Systems  Weight 208 lb (94.3 kg).There is no height or weight on file to calculate BMI.  General Appearance: NA  Eye Contact:  NA  Speech:  Slow  Volume:  Decreased  Mood:  Anxious and Dysphoric  Affect:  NA  Thought Process:  Descriptions of Associations: Intact  Orientation:  Full (Time, Place, and Person)  Thought Content:  Rumination  Suicidal Thoughts:  No  Homicidal Thoughts:  No  Memory:  Immediate;   Good Recent;   Fair Remote;   Fair  Judgement:  Fair  Insight:  Shallow  Psychomotor Activity:  NA  Concentration:  Concentration: Fair and Attention Span: Fair  Recall:  Good  Fund of Knowledge:  Good  Language:  Good  Akathisia:  No  Handed:  Right  AIMS (if indicated):     Assets:  Communication Skills Desire for Improvement Housing Social Support  ADL's:  Intact  Cognition:  WNL  Sleep:   ok      Assessment and Plan: Bipolar disorder type I.  Anxiety.  Patient is concerned about her weight gain.  In the past we have tried cutting down the Risperdal but her symptoms come back.  Patient again insists to reduce the dose.  I recommend try reducing the Risperdal from 0.5 mg in the morning and  1.5 mg at bedtime to just take 1.5 mg at bedtime.  Recommend to discontinue morning dose of Risperdal.  I also encouraged she should need to make some efforts for walking, exercising, watching her calorie intake.  We have referred to see a therapist but patient does not able to get a therapist as most of the therapist in her area does not accept insurance.  Patient reluctant to come to [facility] because of the transportation issue.  Patient told her neurologist recommended if she continued to get pain for trigeminal neuralgia then she may consider Lamictal.  I  explained she has medication side effects and allergies and make sure if she started the new medication watch for the side effects.  She like to keep her Klonopin 1 mg at bedtime, Wellbutrin XL 300 mg in the morning and hydroxyzine 40 mg at bedtime.  I will also refer her to weight loss program.  Discussed medication side effects and benefits.  Recommended to call us back if she has any question or any concern.  Follow-up in 3 months.  Follow Up Instructions:    I discussed the assessment and treatment plan with the patient. The patient was provided an opportunity to ask questions and all were answered. The patient agreed with the plan and demonstrated an understanding of the instructions.   The patient was advised to call back or seek an in-person evaluation if the symptoms worsen or if the condition fails to improve as anticipated.  Collaboration of Care: Other provider involved in patient's care AEB notes are available in Epic to review.  Patient/Guardian was advised Release of Information must be obtained prior to any record release in order to collaborate their care with an outside provider. Patient/Guardian was advised if they have not already done so to contact the registration department to sign all necessary forms in order for [place] to release information regarding their care.   Consent: Patient/Guardian gives verbal consent for treatment and assignment of benefits for services provided during this visit. Patient/Guardian expressed understanding and agreed to proceed.    I provided 29 minutes of non-face-to-face time during this encounter.   [name], MD

## 2022-07-04 ENCOUNTER — Other Ambulatory Visit (HOSPITAL_COMMUNITY): Payer: Self-pay | Admitting: Psychiatry

## 2022-07-04 DIAGNOSIS — F411 Generalized anxiety disorder: Secondary | ICD-10-CM

## 2022-07-04 DIAGNOSIS — F319 Bipolar disorder, unspecified: Secondary | ICD-10-CM

## 2022-07-23 ENCOUNTER — Telehealth (HOSPITAL_COMMUNITY): Payer: Self-pay | Admitting: *Deleted

## 2022-07-23 NOTE — Telephone Encounter (Signed)
Writer spoke with pt to inform that we received disability renewal forms and they have been completed. Will send to [facility] upon provider signature. Pt verbalizes understanding. Writer to call pt back when they receive confirmation from [facility].

## 2022-07-25 NOTE — Telephone Encounter (Signed)
Done

## 2022-07-29 ENCOUNTER — Ambulatory Visit: Payer: Medicare PPO | Admitting: Psychiatry

## 2022-07-30 ENCOUNTER — Ambulatory Visit: Payer: Medicare PPO | Admitting: Psychiatry

## 2022-07-30 VITALS — BP 124/86 | HR 78

## 2022-07-30 DIAGNOSIS — G43719 Chronic migraine without aura, intractable, without status migrainosus: Secondary | ICD-10-CM | POA: Diagnosis not present

## 2022-07-30 MED ORDER — ONABOTULINUMTOXINA 200 UNITS IJ SOLR
155.0000 [IU] | Freq: Once | INTRAMUSCULAR | Status: AC
  Administered 2022-07-30: 155 [IU] via INTRAMUSCULAR

## 2022-07-30 NOTE — Progress Notes (Signed)
[facility]  BOTULINUM TOXIN INJECTION PROCEDURE NOTE  Patient: Rylynn Kobs MRN: [id number]  Indication: Chronic migraines   History:  61 year old female with a history of bipolar disorder, tremor, meningioma who follows in clinic for chronic migraines. She has only had one migraine since her last Botox session which resolved with Maxalt.  Last injection: 05/01/22  Technique: Informed consent was obtained and signed. 200 units onabotulinumtoxinA (Lot# [id number]; Expiration: 01/2025) were reconstituted using normal saline, to a concentration of 5 units per 0.26m. 155 units were injected using sterile technique across 31 sites as follows: corrugator 10, procerus 5 units, frontalis 20 units, temporalis 40 units, occipitalis 30 units, cervical paraspinal 20 units, trapezius 30 units. 45 units were wasted. Patient tolerated procedure without complication.    Prior Therapies                                 Gabapentin - anaphylaxis Oxcarbazepine - anaphylaxis Carbamazepine - nausea and vomiting Depakote - swelling Phenytoin - swelling, rash Aimovig Emgality - joint pain Metoprolol 100 mg daily Propranolol amlodipine Botox (for migraines) Excedrin Imitrex 50 mg PRN Nurtec 70 mg PRN  Plan: -Rescue: Continue Maxalt 10 mg PRN, Nurtec PRN -Return for Botox in 3 months  Cicily Bonano 07/30/22 2:12 PM

## 2022-07-30 NOTE — Progress Notes (Signed)
Botox- 200 units x 1 vial Lot: [id number] Expiration: 01/2025 [facility]: 3462-1947-12   Bacteriostatic 0.9% Sodium Chloride- 43m total Lot: [id number]: 24 Aug 2023 [facility]: [address]-49  Dx: [id number]

## 2022-07-30 NOTE — Progress Notes (Signed)
Botox- 200 units x 1 vial Lot: [id number] Expiration: 01/2025 [facility]: 2493-2419-91   Bacteriostatic 0.9% Sodium Chloride- 66m total Lot: [id number]: 24 Aug 2023 NDC: [address]-22  Dx: [id number]

## 2022-08-07 ENCOUNTER — Other Ambulatory Visit (HOSPITAL_COMMUNITY): Payer: Self-pay | Admitting: Psychiatry

## 2022-08-07 DIAGNOSIS — F411 Generalized anxiety disorder: Secondary | ICD-10-CM

## 2022-08-12 ENCOUNTER — Encounter (INDEPENDENT_AMBULATORY_CARE_PROVIDER_SITE_OTHER): Payer: Medicare PPO | Admitting: Psychiatry

## 2022-08-12 DIAGNOSIS — G500 Trigeminal neuralgia: Secondary | ICD-10-CM

## 2022-08-12 MED ORDER — PREGABALIN 50 MG PO CAPS: 50.0000 mg | ORAL_CAPSULE | Freq: Two times a day (BID) | ORAL | 5 refills | Status: DC

## 2022-08-13 ENCOUNTER — Other Ambulatory Visit: Payer: Self-pay | Admitting: Psychiatry

## 2022-08-13 MED ORDER — BACLOFEN 10 MG PO TABS: ORAL_TABLET | ORAL | 3 refills | Status: DC

## 2022-08-13 NOTE — Addendum Note (Signed)
Addended by: Lester [place] A on: 08/13/2022 01:40 PM   Modules accepted: Orders

## 2022-08-13 NOTE — Telephone Encounter (Signed)

## 2022-08-28 ENCOUNTER — Telehealth (HOSPITAL_COMMUNITY): Payer: Medicare PPO | Admitting: Psychiatry

## 2022-08-30 ENCOUNTER — Encounter (HOSPITAL_COMMUNITY): Payer: Self-pay | Admitting: Psychiatry

## 2022-08-30 ENCOUNTER — Telehealth (HOSPITAL_BASED_OUTPATIENT_CLINIC_OR_DEPARTMENT_OTHER): Payer: Medicare PPO | Admitting: Psychiatry

## 2022-08-30 DIAGNOSIS — F319 Bipolar disorder, unspecified: Secondary | ICD-10-CM | POA: Diagnosis not present

## 2022-08-30 DIAGNOSIS — F411 Generalized anxiety disorder: Secondary | ICD-10-CM

## 2022-08-30 MED ORDER — HYDROXYZINE HCL 25 MG PO TABS: ORAL_TABLET | ORAL | 0 refills | Status: DC

## 2022-08-30 MED ORDER — BUPROPION HCL ER (XL) 300 MG PO TB24: 300.0000 mg | ORAL_TABLET | Freq: Every day | ORAL | 0 refills | Status: DC

## 2022-08-30 MED ORDER — CLONAZEPAM 1 MG PO TABS: 1.0000 mg | ORAL_TABLET | Freq: Every day | ORAL | 0 refills | Status: DC

## 2022-08-30 MED ORDER — RISPERIDONE 1 MG PO TABS: ORAL_TABLET | ORAL | 0 refills | Status: DC

## 2022-08-30 NOTE — Progress Notes (Signed)
Virtual Visit via Telephone Note  I connected with [name] on 08/30/22 at  8:20 AM EDT by telephone and verified that I am speaking with the correct person using two identifiers.  Location: Patient: Home Provider: Home Office   I discussed the limitations, risks, security and privacy concerns of performing an evaluation and management service by telephone and the availability of in person appointments. I also discussed with the patient that there may be a patient responsible charge related to this service. The patient expressed understanding and agreed to proceed.   History of Present Illness: Patient is evaluated by phone session.  She is now taking Risperdal 0.5 mg in the morning and 1 mg at bedtime.  So far she is stable on the medication and denies any paranoia, hallucination.  She denies any highs and lows, impulsive behavior, mania or excessive buying.  She still have anxiety and nervousness and does not go outside by herself.  She only drives 1 mile to visit her son who lives mile away.  She reported having headaches and she rescheduled her last appointment because having headaches.  She is receiving Botox for trigeminal neuralgia and that helps pain.  Recently her neurologist prescribed Lyrica but she had an anaphylactic reaction with gabapentin and she is scared to take it.  She had a good support from her husband [place].  She tried to avoid going to public places because she gets very anxious and nervous.  Her husband is working for a fall festival and asked her to come but patient refused because she cannot handle crowded places.  She sleeps okay but she requires every night 2 pills of hydroxyzine.  She is relieved that her younger son is now taking medication for her depression and connected with primary care doctor.  She wants him to see a psychiatrist but her son does not feel comfortable and agreed to continue medicine from the PCP.  Patient has mild tremors but able to function.  She  takes Klonopin, Wellbutrin and Risperdal.  She is pleased that she had lost few pounds since last visit.  She is watching her calorie intake and cut down her sweets.  She does not go outside to walk but sometimes she sits outside on the porch.  Patient told her husband noticed that his improvement is usually she stays in her room.  Patient reported her thinking is much clearer and she denies any hallucination.  She sleeps most of the night is okay if she takes hydroxyzine 20 mg.  She denies drinking or using any illegal substances.  We have referred therapy but patient insurance does not cover his therapy appointment.   Past Psychiatric History:  H/O bipolar disorder and multiple inpatient treatment.  Last inpatient in September 2014 for suicidal thinking, hallucination as playing with guns and superficially cut her wrist with a needle.  H/O mania and psychosis. Tried Zyprexa Seroquel but stopped due to weight gain.  Tegretol caused increased paranoia confusion and required inpatient.     Psychiatric Specialty Exam: Physical Exam  Review of Systems  Neurological:  Positive for headaches.    Weight 203 lb (92.1 kg).Body mass index is 34.84 kg/m.  General Appearance: NA  Eye Contact:  NA  Speech:  Clear and Coherent and Slow  Volume:  Normal  Mood:  Anxious  Affect:  NA  Thought Process:  Descriptions of Associations: Intact  Orientation:  Full (Time, Place, and Person)  Thought Content:  Rumination  Suicidal Thoughts:  No  Homicidal Thoughts:  No  Memory:  Immediate;   Good Recent;   Fair Remote;   Fair  Judgement:  Intact  Insight:  Present  Psychomotor Activity:  Tremor  Concentration:  Concentration: Fair and Attention Span: Fair  Recall:  Good  Fund of Knowledge:  Good  Language:  Good  Akathisia:  No  Handed:  Right  AIMS (if indicated):     Assets:  Communication Skills Desire for Improvement Housing Social Support  ADL's:  Intact  Cognition:  WNL  Sleep:   ok with  vistaril     Assessment and Plan: Bipolar disorder type I.  Anxiety.  Patient now taking a total dose of Risperdal 1.5 mg but she preferred to take 0.5 in the morning and 1 mg at bedtime.  She lost few pounds as watching her calorie intake.  She had appointment to see neurology to address her headaches and trigeminal neuralgia.  She has mild tremors but able to function.  I recommend to try hydroxyzine 25 mg at bedtime since it is helping her sleep and extra 5 mg may help improvement in her sleep.  She is allergic to many medication and for that reason she had not started Lyrica for pain.  Encourage to continue to try walking 10 to 15 minutes 2-3 times a week.  Continue Klonopin 1 mg at bedtime, continue Wellbutrin XL 300 mg in the morning.  Patient like to send all her prescription to [facility] for 90 days.  I recommend to call us back if she has any question or any concern.  Follow-up in 3 months.  Follow Up Instructions:    I discussed the assessment and treatment plan with the patient. The patient was provided an opportunity to ask questions and all were answered. The patient agreed with the plan and demonstrated an understanding of the instructions.   The patient was advised to call back or seek an in-person evaluation if the symptoms worsen or if the condition fails to improve as anticipated.  Collaboration of Care: Other provider involved in patient's care AEB notes are available in epic to review.  Patient/Guardian was advised Release of Information must be obtained prior to any record release in order to collaborate their care with an outside provider. Patient/Guardian was advised if they have not already done so to contact the registration department to sign all necessary forms in order for [place] to release information regarding their care.   Consent: Patient/Guardian gives verbal consent for treatment and assignment of benefits for services provided during this visit. Patient/Guardian  expressed understanding and agreed to proceed.    I provided 28 minutes of non-face-to-face time during this encounter.   [name], MD

## 2022-09-05 ENCOUNTER — Telehealth (HOSPITAL_COMMUNITY): Payer: Medicare PPO | Admitting: Psychiatry

## 2022-09-07 ENCOUNTER — Emergency Department (HOSPITAL_COMMUNITY)
Admission: EM | Admit: 2022-09-07 | Discharge: 2022-09-07 | Disposition: A | Discharge status: Home / Self Care | Payer: Medicare PPO | Attending: Emergency Medicine | Admitting: Emergency Medicine

## 2022-09-07 ENCOUNTER — Other Ambulatory Visit (HOSPITAL_COMMUNITY): Payer: Self-pay | Admitting: Psychiatry

## 2022-09-07 ENCOUNTER — Emergency Department (HOSPITAL_COMMUNITY): Payer: Medicare PPO

## 2022-09-07 ENCOUNTER — Other Ambulatory Visit: Payer: Self-pay

## 2022-09-07 ENCOUNTER — Encounter (HOSPITAL_COMMUNITY): Payer: Self-pay | Admitting: *Deleted

## 2022-09-07 DIAGNOSIS — I10 Essential (primary) hypertension: Secondary | ICD-10-CM | POA: Insufficient documentation

## 2022-09-07 DIAGNOSIS — F411 Generalized anxiety disorder: Secondary | ICD-10-CM

## 2022-09-07 DIAGNOSIS — M546 Pain in thoracic spine: Secondary | ICD-10-CM | POA: Insufficient documentation

## 2022-09-07 DIAGNOSIS — Z79899 Other long term (current) drug therapy: Secondary | ICD-10-CM | POA: Diagnosis not present

## 2022-09-07 DIAGNOSIS — F319 Bipolar disorder, unspecified: Secondary | ICD-10-CM

## 2022-09-07 DIAGNOSIS — M549 Dorsalgia, unspecified: Secondary | ICD-10-CM

## 2022-09-07 MED ORDER — OXYCODONE-ACETAMINOPHEN 5-325 MG PO TABS
ORAL_TABLET | ORAL | 0 refills | Status: DC
Start: 2022-09-07 — End: 2023-06-29

## 2022-09-07 MED ORDER — NAPROXEN 500 MG PO TABS: ORAL_TABLET | ORAL | 0 refills | Status: DC

## 2022-09-07 MED ORDER — HYDROCODONE-ACETAMINOPHEN 5-325 MG PO TABS
1.0000 | ORAL_TABLET | Freq: Once | ORAL | Status: AC
  Administered 2022-09-07: 1 via ORAL
  Filled 2022-09-07: qty 1

## 2022-09-07 NOTE — Discharge Instructions (Addendum)
Follow-up with your family doctor later this week for recheck

## 2022-09-07 NOTE — ED Provider Notes (Signed)
[facility] EMERGENCY DEPARTMENT Provider Note   CSN: [id number] Arrival date & time: 09/07/22  2202     History {Add pertinent medical, surgical, social history, OB history to HPI:1} Chief Complaint  Patient presents with   Back Pain    [name] is a 61 y.o. female.  Patient complains of pain to her left upper back.  Is worse with movement of her left arm.  Patient having no shortness of breath no sweating.  She has a history of hypertension.   Back Pain      Home Medications Prior to Admission medications   Medication Sig Start Date End Date Taking? Authorizing Provider  naproxen (NAPROSYN) 500 MG tablet Take 1 pill twice a day for back pain. 09/07/22  Yes Milton Ferguson, MD  oxyCODONE-acetaminophen (PERCOCET/ROXICET) 5-325 MG tablet Take 1 every 6 hours for pain not relieved by the Naprosyn 09/07/22  Yes Milton Ferguson, MD  amLODipine (NORVASC) 2.5 MG tablet Take 2.5 mg by mouth daily. 02/16/20   [provider]  baclofen (LIORESAL) 10 MG tablet Take 1/2-1 pill up to three times a day as needed for facial pain Patient not taking: Reported on 08/30/2022 08/13/22   Genia Harold, MD  buPROPion (WELLBUTRIN XL) 300 MG 24 hr tablet Take 1 tablet (300 mg total) by mouth daily. TAKE ONE TABLET BY MOUTH EVERY MORINING FOR DEPRESSION 08/30/22   Arfeen, Arlyce Harman, MD  clonazePAM (KLONOPIN) 1 MG tablet Take 1 tablet (1 mg total) by mouth at bedtime. 08/30/22   Arfeen, Arlyce Harman, MD  hydrOXYzine (ATARAX) 25 MG tablet Take one tab as needed at bedtime. 08/30/22   Arfeen, Arlyce Harman, MD  metoprolol succinate (TOPROL-XL) 50 MG 24 hr tablet Take 100 mg by mouth daily. 04/18/21   [provider]  mupirocin ointment (BACTROBAN) 2 % Apply 1 application topically 2 (two) times daily. 02/17/22   Wurst, [place], PA-C  prochlorperazine (COMPAZINE) 10 MG tablet Take 1 tablet (10 mg total) by mouth 2 (two) times daily as needed for nausea or vomiting. 04/19/21   Davonna Belling, MD  Rimegepant  Sulfate (NURTEC) 75 MG TBDP Take 1 tablet at the onset of a headache/migraine. No more than 1 tablet in a 24 hour period. 01/17/22   Suzzanne Cloud, NP  risperiDONE (RISPERDAL) 1 MG tablet Take 1-1/2 tablets (1.5 mg total dose) at bedtime. 08/30/22   Arfeen, Arlyce Harman, MD  rizatriptan (MAXALT) 10 MG tablet Take 1 tablet (10 mg total) by mouth as needed for migraine. May repeat in 2 hours if needed. Max dose 2 pills in 24 hours 05/27/22   Genia Harold, MD      Allergies    Gabapentin, Oxcarbazepine, Tegretol [carbamazepine], Codeine, Divalproex sodium, Erythromycin, Phenytoin sodium extended, and Phenytoin sodium extended    Review of Systems   Review of Systems  Musculoskeletal:  Positive for back pain.    Physical Exam Updated Vital Signs BP 105/77   Pulse 98   Temp 99.1 F (37.3 C) (Oral)   Resp 17   Ht '5\' 3"'$  (1.6 m)   Wt 93 kg   SpO2 96%   BMI 36.31 kg/m  Physical Exam  ED Results / Procedures / Treatments   Labs (all labs ordered are listed, but only abnormal results are displayed) Labs Reviewed - No data to display  EKG None  Radiology DG Chest 2 View  Result Date: 09/07/2022 CLINICAL DATA:  Upper back pain EXAM: CHEST - 2 VIEW COMPARISON:  03/01/2022 FINDINGS: The  heart size and mediastinal contours are within normal limits. Both lungs are clear. The visualized skeletal structures are unremarkable. IMPRESSION: No active cardiopulmonary disease. Electronically Signed   By: Inez Catalina M.D.   On: 09/07/2022 22:35    Procedures Procedures  {Document cardiac monitor, telemetry assessment procedure when appropriate:1}  Medications Ordered in ED Medications  HYDROcodone-acetaminophen (NORCO/VICODIN) 5-325 MG per tablet 1 tablet (1 tablet Oral Given 09/07/22 2238)    ED Course/ Medical Decision Making/ A&P  Chest x-ray unremarkable.  Suspect musculoskeletal pain.                         Medical Decision Making Amount and/or Complexity of Data Reviewed Radiology:  ordered.  Risk Prescription drug management.   Patient with upper back pain.  Patient with inflamed left trapezius muscle.  She will be placed on Naprosyn and Percocets if necessary and follow-up with her PCP  {Document critical care time when appropriate:1} {Document review of labs and clinical decision tools ie heart score, Chads2Vasc2 etc:1}  {Document your independent review of radiology images, and any outside records:1} {Document your discussion with family members, caretakers, and with consultants:1} {Document social determinants of health affecting pt's care:1} {Document your decision making why or why not admission, treatments were needed:1} Final Clinical Impression(s) / ED Diagnoses Final diagnoses:  Upper back pain    Rx / DC Orders ED Discharge Orders          Ordered    naproxen (NAPROSYN) 500 MG tablet        09/07/22 2257    oxyCODONE-acetaminophen (PERCOCET/ROXICET) 5-325 MG tablet        09/07/22 2257

## 2022-09-07 NOTE — ED Triage Notes (Signed)
Pt with c/o mid upper back pain that radiates left shoulder/arm.  Started last week but pain has gotten worse. Denies SOB or N/V. + cough-productive at times

## 2022-09-07 NOTE — ED Notes (Signed)
ED Provider at bedside. 

## 2022-10-21 ENCOUNTER — Encounter: Payer: Self-pay | Admitting: Psychiatry

## 2022-10-22 NOTE — Telephone Encounter (Signed)
I can see her for these symptoms. I'll refer her to Dr Felecia Shelling if I think she needs an MS workup

## 2022-10-23 ENCOUNTER — Telehealth: Payer: Medicare PPO

## 2022-10-23 NOTE — Progress Notes (Unsigned)
[facility]  BOTULINUM TOXIN INJECTION PROCEDURE NOTE  Patient: [name] MRN: [id number]  Indication: Chronic migraines   History: 61 year old female with a history of bipolar disorder, tremor, meningioma who follows in clinic for chronic migraines. She has not had any headaches since her last injection. Her facial pain has resolved with baclofen. She reports pain and muscle spasms in her upper back and arms. States she recently got spine imaging, these records are not available for review today.  Last injection: 07/30/22  Technique: Informed consent was obtained and signed. 200 units onabotulinumtoxinA (Lot# [id number] ; Expiration: 01/2025) were reconstituted using normal saline, to a concentration of 5 units per 0.44m. 155 units were injected using sterile technique across 31 sites as follows: corrugator 10, procerus 5 units, frontalis 20 units, temporalis 40 units, occipitalis 30 units, cervical paraspinal 20 units, trapezius 30 units. 45 units were wasted. Patient tolerated procedure without complication.  Prior Therapies                                 Prevention: Gabapentin - anaphylaxis Oxcarbazepine - anaphylaxis Carbamazepine - nausea and vomiting Depakote - swelling Phenytoin - swelling, rash Aimovig Emgality - joint pain Metoprolol 100 mg daily Propranolol amlodipine Botox (for migraines)  Rescue: Excedrin Imitrex 50 mg PRN Maxalt 10 mg PRN Nurtec 70 mg PRN Baclofen   Plan: -Continue Maxalt and Nurtec for migraine rescue -Continue Botox for migraine prevention -Continue baclofen 5-10 mg PRN for facial pain -Patient will have results from recent spine imaging faxed to our office  JAnderson MaltaChima 10/24/22 2:11 PM

## 2022-10-23 NOTE — Telephone Encounter (Signed)
I looked at her MRI report from 05/2021 and it did not show evidence of MS at that time. Since all of these issues seem to be chronic, I'm not sure it would be beneficial to refer to Dr. Felecia Shelling without any clear evidence of MS. She will have to set up a follow up appointment to discuss her new issues because we won't have time to address them all during a Botox appointment. She can set up a virtual appointment for that if it is easier for her

## 2022-10-24 ENCOUNTER — Ambulatory Visit: Payer: Medicare PPO | Admitting: Psychiatry

## 2022-10-24 ENCOUNTER — Encounter: Payer: Self-pay | Admitting: Psychiatry

## 2022-10-24 VITALS — BP 130/89 | HR 69

## 2022-10-24 DIAGNOSIS — G43719 Chronic migraine without aura, intractable, without status migrainosus: Secondary | ICD-10-CM

## 2022-10-24 MED ORDER — ONABOTULINUMTOXINA 200 UNITS IJ SOLR
155.0000 [IU] | Freq: Once | INTRAMUSCULAR | Status: AC
  Administered 2022-10-24: 155 [IU] via INTRAMUSCULAR

## 2022-10-24 NOTE — Progress Notes (Signed)
Botox- 200 units x 1 vial Lot: [id number] Expiration: 01/2025 NDC: 5427-0623-76  Bacteriostatic 0.9% Sodium Chloride- 45m total Lot: [id number]: 08/2023 NDC: [address]-10 Dx: [id number] BB

## 2022-10-28 ENCOUNTER — Encounter: Payer: Self-pay | Admitting: Psychiatry

## 2022-11-14 ENCOUNTER — Other Ambulatory Visit (HOSPITAL_COMMUNITY): Payer: Self-pay | Admitting: Psychiatry

## 2022-11-14 DIAGNOSIS — F319 Bipolar disorder, unspecified: Secondary | ICD-10-CM

## 2022-11-14 DIAGNOSIS — F411 Generalized anxiety disorder: Secondary | ICD-10-CM

## 2022-11-26 ENCOUNTER — Ambulatory Visit: Payer: Medicare PPO | Admitting: Psychiatry

## 2022-11-26 ENCOUNTER — Encounter: Payer: Self-pay | Admitting: Psychiatry

## 2022-11-26 VITALS — BP 134/83 | HR 85 | Ht 63.0 in | Wt 203.0 lb

## 2022-11-26 DIAGNOSIS — R413 Other amnesia: Secondary | ICD-10-CM

## 2022-11-26 NOTE — Progress Notes (Addendum)
   CC:  memory loss  Follow-up Visit  Last visit: 05/01/22  Brief HPI: 61 year old female with a history of bipolar disorder, meningioma s/p resection, migraines who follows in clinic for facial pain and migraines. She is currently on Botox for migraine prevention and Maxalt/Nurtec for rescue. Takes baclofen as needed for facial pain. Brain MRI 16/28/22 with stable post-operative changes and no acute process.  Interval History: She presents today for memory concerns which began after her meningioma resection in 2000. Feels her short term memory has been progressively worsening over time since then. She will forget conversations she just had and will repeat herself often. Has noticed some word finding difficulty as well. Sometimes she feels like she is losing periods of time. Will sit down to watch TV, then a program will be over and she will not remember watching it. She has mostly stopped driving since 5573 because she was worried about getting confused. Has trouble remembering if she took her morning medication doses or not. She manages finances in the house and has had some increased trouble keeping the bills straight.  [name] but feels she has been mostly independent otherwise.   Physical Exam:   Vital Signs: BP 134/83   Pulse 85   Ht '5\' 3"'$  (1.6 m)   Wt 203 lb (92.1 kg)   BMI 35.96 kg/m  GENERAL:  well appearing, in no acute distress, alert  SKIN:  Color, texture, turgor normal. No rashes or lesions HEAD:  Normocephalic/atraumatic. RESP: normal respiratory effort MSK:  No gross joint deformities.   NEUROLOGICAL: Mental Status:    11/26/2022    2:25 PM  Montreal Cognitive Assessment   Visuospatial/ Executive (0/5) 4  Naming (0/3) 3  Attention: Read list of digits (0/2) 0  Attention: Read list of letters (0/1) 1  Attention: Serial 7 subtraction starting at 100 (0/3) 3  Language: Repeat phrase (0/2) 1  Language : Fluency (0/1) 0   Abstraction (0/2) 2  Delayed Recall (0/5) 1  Orientation (0/6) 6  Total 21   Cranial Nerves: PERRL, face symmetric, no dysarthria, hearing grossly intact Motor: moves all extremities equally Gait: normal-based.  IMPRESSION: 61 year old female with a history of bipolar disorder, meningioma s/p resection, migraines who presents for follow up. Her primary concern today is her memory. She feels memory has been progressively worsening since her meningioma resection in 2000. MRI brain in June 2022 was stable. MOCA today is 21/30, which is consistent with mild cognitive impairment. Recent TSH was normal. Will check B12 level today. EEG ordered to assess for subclinical seizures as she reports episodes of losing time.  PLAN: -EEG -B12, CBC -Will repeat MOCA in 6 months. If memory appears to be declining at that time will refer for neuropsychological testing  Follow-up: 6 months  I spent a total of 28 minutes on the date of the service. Discussed medication side effects, adverse reactions and drug interactions. Written educational materials and patient instructions outlining all of the above were given.  Genia Harold, MD 11/26/22 3:01 PM

## 2022-11-26 NOTE — Patient Instructions (Addendum)
Plan: -Blood work to check blood levels and B12 level -EEG -Repeat memory testing in 6 months, if memory is worse will refer for repeat neuropsychological testing  Mild Cognitive Impairment A person with mild cognitive impairment (MCI) experiences memory problems greater than normally expected with aging, but not serious enough to interfere with daily activities.  The patient with MCI complains of difficulty with memory. Typically, the complaints include trouble remembering the names of people they met recently, trouble remembering the flow of a conversation, and an increased tendency to misplace things, or similar problems. In many cases, the individual will be aware of these difficulties and will compensate with increased reliance on notes and calendars.   Although there is an increased chances of going on to develop dementia, it is not possible currently to predict with certainty which patients with MCI will or will not go on to develop dementia. There is currently no specific treatment for MCI. People leading sedentary lifestyles are at greater risk for developing dementia. Increased physical activity and brain exercise can help with maintaining brain function.  Tasks to improve attention/working memory 1. Good sleep hygiene (7-8 hrs of sleep) 2. Learning a new skill (Painting, Carpentry, Pottery, new language, Knitting). 3.Cognitive exercises (keep a daily journal, Puzzles) 4. Physical exercise and training  (30 min/day X 4 days week) 5. Being on Antidepressant if needed 6.Yoga, Meditation, Tai Chi 7. Decrease alcohol intake 8.Have a clear schedule and structure in daily routine  MIND Diet: The [place] Diet Intervention for Neurodegenerative Delay, or MIND diet, targets the health of the aging brain. Research participants with the highest MIND diet scores had a significantly slower rate of cognitive decline compared with those with the lowest scores. The effects of the MIND diet  on cognition showed greater effects than either the Mediterranean or the DASH diet alone.  The healthy items the MIND diet guidelines suggest include:  3+ servings a day of whole grains 1+ servings a day of vegetables (other than green leafy) 6+ servings a week of green leafy vegetables 5+ servings a week of nuts 4+ meals a week of beans 2+ servings a week of berries 2+ meals a week of poultry 1+ meals a week of fish Mainly olive oil if added fat is used  The unhealthy items, which are higher in saturated and trans fat, include: Less than 5 servings a week of pastries and sweets Less than 4 servings a week of red meat (including beef, pork, lamb, and products made from these meats) Less than one serving a week of cheese and fried foods Less than 1 tablespoon a day of butter/stick margarine

## 2022-11-27 LAB — CBC WITH DIFFERENTIAL/PLATELET
Basophils Absolute: 0.1 10*3/uL (ref 0.0–0.2)
Basos: 1 %
EOS (ABSOLUTE): 0.1 10*3/uL (ref 0.0–0.4)
Eos: 1 %
Hematocrit: 43.4 % (ref 34.0–46.6)
Hemoglobin: 14.7 g/dL (ref 11.1–15.9)
Immature Grans (Abs): 0 10*3/uL (ref 0.0–0.1)
Immature Granulocytes: 0 %
Lymphocytes Absolute: 2.4 10*3/uL (ref 0.7–3.1)
Lymphs: 24 %
MCH: 30.1 pg (ref 26.6–33.0)
MCHC: 33.9 g/dL (ref 31.5–35.7)
MCV: 89 fL (ref 79–97)
Monocytes Absolute: 0.7 10*3/uL (ref 0.1–0.9)
Monocytes: 7 %
Neutrophils Absolute: 6.7 10*3/uL (ref 1.4–7.0)
Neutrophils: 67 %
Platelets: 300 10*3/uL (ref 150–450)
RBC: 4.88 x10E6/uL (ref 3.77–5.28)
RDW: 11.9 % (ref 11.7–15.4)
WBC: 9.9 10*3/uL (ref 3.4–10.8)

## 2022-11-27 LAB — VITAMIN B12: Vitamin B-12: 251 pg/mL (ref 232–1245)

## 2022-11-29 ENCOUNTER — Encounter (HOSPITAL_COMMUNITY): Payer: Self-pay | Admitting: Psychiatry

## 2022-11-29 ENCOUNTER — Telehealth (HOSPITAL_BASED_OUTPATIENT_CLINIC_OR_DEPARTMENT_OTHER): Payer: Medicare PPO | Admitting: Psychiatry

## 2022-11-29 VITALS — Wt 203.0 lb

## 2022-11-29 DIAGNOSIS — F411 Generalized anxiety disorder: Secondary | ICD-10-CM

## 2022-11-29 DIAGNOSIS — F09 Unspecified mental disorder due to known physiological condition: Secondary | ICD-10-CM

## 2022-11-29 DIAGNOSIS — F319 Bipolar disorder, unspecified: Secondary | ICD-10-CM | POA: Diagnosis not present

## 2022-11-29 MED ORDER — HYDROXYZINE HCL 25 MG PO TABS: ORAL_TABLET | ORAL | 0 refills | Status: DC

## 2022-11-29 MED ORDER — BUPROPION HCL ER (XL) 300 MG PO TB24: 300.0000 mg | ORAL_TABLET | Freq: Every day | ORAL | 0 refills | Status: DC

## 2022-11-29 MED ORDER — CLONAZEPAM 1 MG PO TABS: 1.0000 mg | ORAL_TABLET | Freq: Every day | ORAL | 0 refills | Status: DC

## 2022-11-29 MED ORDER — RISPERIDONE 1 MG PO TABS: ORAL_TABLET | ORAL | 0 refills | Status: DC

## 2022-11-29 NOTE — Progress Notes (Signed)
Virtual Visit via Telephone Note  I connected with [name] on 11/29/22 at  8:40 AM EST by telephone and verified that I am speaking with the correct person using two identifiers.  Location: Patient: Home Provider: Home Office   I discussed the limitations, risks, security and privacy concerns of performing an evaluation and management service by telephone and the availability of in person appointments. I also discussed with the patient that there may be a patient responsible charge related to this service. The patient expressed understanding and agreed to proceed.   History of Present Illness: Patient is evaluated by phone session.  She recently had a visit with neurology and have a memory evaluation.  Her B12 level is borderline and she is not prescribed B12 and also going through the process of dementia workup.  Her MOCA was 21/30.  She is also scheduled for EEG.  She is sleeping good with the Klonopin and hydroxyzine.  Her paranoia, hallucinations are better but she still does not want to go outside to crowded places.  Sometimes she gets confused.  Her husband [name] is very supportive and helpful.  She started walking with him every day.  She also started playing puzzle and eating Mediterranean diet.  She started cooking but sometimes she forgets the recipe but her husband helps.  She had a good Thanksgiving and she is looking forward to have a good Christmas.  She has a 59 year old and 60 year old son who lives close by and she has 3 grandkids and 1 grandkid on her way.  She feels the current medicine is helping and she denies any mania, impulsive behavior, highs and lows, crying spells or any agitation.  She denies any suicidal thoughts.  Her appetite is okay.  She is getting Botox for trigeminal neuralgia which is helping her.  She has mild tremors but stable.  She reported her thinking is much clear but she like to be around her family members rather than with the strangers.  She is keeping  appointment with the providers.  Past Psychiatric History:  H/O bipolar disorder and multiple inpatient treatment.  Last inpatient in September 2014 for suicidal thinking, hallucination as playing with guns and superficially cut her wrist with a needle.  H/O mania and psychosis. Tried Zyprexa Seroquel but stopped due to weight gain.  Tegretol caused increased paranoia confusion and required inpatient.     Recent Results (from the past 2160 hour(s))  Vitamin B12     Status: None   Collection Time: 11/26/22  2:57 PM  Result Value Ref Range   Vitamin B-12 251 232 - 1,245 pg/mL  CBC with Differential/Platelets     Status: None   Collection Time: 11/26/22  2:57 PM  Result Value Ref Range   WBC 9.9 3.4 - 10.8 x10E3/uL   RBC 4.88 3.77 - 5.28 x10E6/uL   Hemoglobin 14.7 11.1 - 15.9 g/dL   Hematocrit 43.4 34.0 - 46.6 %   MCV 89 79 - 97 fL   MCH 30.1 26.6 - 33.0 pg   MCHC 33.9 31.5 - 35.7 g/dL   RDW 11.9 11.7 - 15.4 %   Platelets 300 150 - 450 x10E3/uL   Neutrophils 67 Not Estab. %   Lymphs 24 Not Estab. %   Monocytes 7 Not Estab. %   Eos 1 Not Estab. %   Basos 1 Not Estab. %   Neutrophils Absolute 6.7 1.4 - 7.0 x10E3/uL   Lymphocytes Absolute 2.4 0.7 - 3.1 x10E3/uL   Monocytes Absolute  0.7 0.1 - 0.9 x10E3/uL   EOS (ABSOLUTE) 0.1 0.0 - 0.4 x10E3/uL   Basophils Absolute 0.1 0.0 - 0.2 x10E3/uL   Immature Granulocytes 0 Not Estab. %   Immature Grans (Abs) 0.0 0.0 - 0.1 x10E3/uL      Psychiatric Specialty Exam: Physical Exam  Review of Systems  Weight 203 lb (92.1 kg).Body mass index is 35.96 kg/m.  General Appearance: NA  Eye Contact:  NA  Speech:  Slow  Volume:  Normal  Mood:  Anxious  Affect:  NA  Thought Process:  Descriptions of Associations: Intact  Orientation:  Full (Time, Place, and Person)  Thought Content:  Rumination  Suicidal Thoughts:  No  Homicidal Thoughts:  No  Memory:  Immediate;   Good Recent;   Good Remote;   Fair  Judgement:  Intact  Insight:  Present   Psychomotor Activity:  Tremor  Concentration:  Concentration: Fair and Attention Span: Fair  Recall:  [employer] of Knowledge:  Good  Language:  Good  Akathisia:  No  Handed:  Right  AIMS (if indicated):     Assets:  Communication Skills Desire for Improvement Housing Resilience Social Support  ADL's:  Intact  Cognition:  Impaired,  Mild  Sleep:   ok with klonopin and vistaril.     Assessment and Plan: Bipolar disorder type I.  Generalized anxiety disorder.  Mild cognitive impairment  I reviewed notes from neurology and reviewed blood work results.  Her B12 was borderline.  Her mocha was 21/30.  She is sleeping better with Klonopin and started walking, playing puzzles and talking with the help of her husband.  She noticed that helps her anxiety.  We did discuss about cutting down the medication as psychotropic medication sometimes contribute to memory issues but patient very reluctant to cut down the medication as her symptoms are stable.  She is trying to be more active.  She does not want to change or reduce the medication.  I recommend if she is tired and sleeping well then she may want to take the Klonopin half tablet unless she need a full dose.  She agreed to try.  Continue Wellbutrin XL 300 mg in the morning, Klonopin 1 mg at bedtime, Risperdal 1.5 mg daily and hydroxyzine 25 mg at bedtime.  Recommended to call us back if she has any question or any concern.  Follow-up in 3 months.  Follow Up Instructions:    I discussed the assessment and treatment plan with the patient. The patient was provided an opportunity to ask questions and all were answered. The patient agreed with the plan and demonstrated an understanding of the instructions.   The patient was advised to call back or seek an in-person evaluation if the symptoms worsen or if the condition fails to improve as anticipated.  Collaboration of Care: Other provider involved in patient's care AEB notes are available in epic  to review.  Patient/Guardian was advised Release of Information must be obtained prior to any record release in order to collaborate their care with an outside provider. Patient/Guardian was advised if they have not already done so to contact the registration department to sign all necessary forms in order for [place] to release information regarding their care.   Consent: Patient/Guardian gives verbal consent for treatment and assignment of benefits for services provided during this visit. Patient/Guardian expressed understanding and agreed to proceed.    I provided 30 minutes of non-face-to-face time during this encounter.   [name], MD

## 2022-12-10 ENCOUNTER — Other Ambulatory Visit (HOSPITAL_COMMUNITY): Payer: Self-pay | Admitting: Psychiatry

## 2022-12-10 DIAGNOSIS — F319 Bipolar disorder, unspecified: Secondary | ICD-10-CM

## 2022-12-10 DIAGNOSIS — F411 Generalized anxiety disorder: Secondary | ICD-10-CM

## 2022-12-12 ENCOUNTER — Ambulatory Visit: Payer: Medicare PPO | Admitting: Psychiatry

## 2022-12-30 ENCOUNTER — Telehealth: Payer: Self-pay | Admitting: Psychiatry

## 2022-12-30 ENCOUNTER — Ambulatory Visit: Payer: Medicare PPO | Admitting: Neurology

## 2022-12-30 DIAGNOSIS — R413 Other amnesia: Secondary | ICD-10-CM

## 2022-12-30 DIAGNOSIS — R4182 Altered mental status, unspecified: Secondary | ICD-10-CM | POA: Diagnosis not present

## 2022-12-30 NOTE — Procedures (Signed)
    History:  62 year old woman here to rule out seizures  EEG classification: Awake and drowsy  Description of the recording: The background rhythms of this recording consists of a fairly well modulated medium amplitude alpha rhythm of 12 Hz that is reactive to eye opening and closure. Present in the anterior head region is a 15-20 Hz beta activity. Photic stimulation was performed, did not show any abnormalities. Hyperventilation was also performed, did not show any abnormalities. Drowsiness was manifested by background fragmentation. No abnormal epileptiform discharges seen during this recording. There was no focal slowing. There were no electrographic seizure identified. There is presence of breach rhythm in the left anterior region.   Abnormality: None   Impression: This is a normal EEG recorded while drowsy and awake. No evidence of interictal epileptiform discharges. Normal EEGs, however, do not rule out epilepsy.    Alric Ran, MD [facility]

## 2022-12-30 NOTE — Telephone Encounter (Signed)
Pt takes a prescribed sleep aid to help her clozepam, risperiDONE, hydrOXYzine, melatonin. Did not take any of these medications last night. She asking can she still get the EEG today? Would like a call back before 12 pm, that's when my husband getting off work to bring me to appt.

## 2022-12-31 ENCOUNTER — Encounter (INDEPENDENT_AMBULATORY_CARE_PROVIDER_SITE_OTHER): Payer: Medicare PPO | Admitting: Psychiatry

## 2022-12-31 DIAGNOSIS — R413 Other amnesia: Secondary | ICD-10-CM | POA: Diagnosis not present

## 2022-12-31 NOTE — Telephone Encounter (Signed)

## 2023-01-01 ENCOUNTER — Encounter: Payer: Self-pay | Admitting: Psychiatry

## 2023-01-08 ENCOUNTER — Encounter: Payer: Self-pay | Admitting: Neurology

## 2023-01-08 ENCOUNTER — Telehealth: Payer: Self-pay | Admitting: Psychiatry

## 2023-01-08 NOTE — Telephone Encounter (Signed)
Pt is on the schedule for botox injection for 01/23/23 and will need a new PA before appointment. Prior PA expired 12/22/22.

## 2023-01-10 ENCOUNTER — Other Ambulatory Visit (HOSPITAL_COMMUNITY): Payer: Self-pay

## 2023-01-10 NOTE — Telephone Encounter (Signed)
Pharmacy Patient Advocate Encounter   Received notification from [facility] that prior authorization for Botox 200UNIT solution is required/requested.  PA submitted on 01/10/2023 to (ins) [employer] via CoverMyMeds [id number]  Status is pending

## 2023-01-10 NOTE — Telephone Encounter (Signed)
BOTOX ONE-Benefit Verification [id number] Submitted!

## 2023-01-13 NOTE — Telephone Encounter (Signed)
Appeal submitted on the e appeal available online on CMM. Submitted old notes along with most recent notes showing where the pt has tried and failed topamax. Explained switching the pt to effexor is not safe considering her bipolar treatment plan and she is already established on medication plan. States should get a response within 3-7 business days.

## 2023-01-13 NOTE — Telephone Encounter (Signed)
Noted, thank you

## 2023-01-13 NOTE — Telephone Encounter (Signed)
Pharmacy Patient Advocate Encounter  Received notification from [facility] that the request for prior authorization for Botox 200UNIT solution has been denied due to The preferred drug(s), you may not have tried, are: topiramate tablet venlafaxine tablet Your provider needs to give [place] medical reasons why the preferred drug(s) would not work for you and/or would have bad side effects. Sometimes a preferred drug needs more review for approval. Additionally, some preferred drugs listed may be the same drugs with different strengths or forms. [employer] may only require one strength or form of that drug to be tried. This decision was from [facility] Non-Formulary Exceptions Coverage Policy..    Searching the PT past med list I saw she had taken Topiramate in the past and I did let the insurance know that she has taken it in the past but they still denied-we will initiate the appeal for this PT.  Please be advised we currently do not have a Pharmacist to review denials. If you would like [place] to submit it on your behalf, please provide clinical information to support your reason for appeal and any pertinent information you would like [place] to include with the appeal request. Appeals may take longer 5 business days to be submitted as we prepares necessary documentation. Thanks for your support.

## 2023-01-14 ENCOUNTER — Encounter: Payer: Self-pay | Admitting: Psychiatry

## 2023-01-14 NOTE — Telephone Encounter (Signed)
Pt called in and was checking on how she could get her payments lower on the botox. Advised that I am unsure what they may be referring to. On our end I see, we have completed a prior authorization to get the botox approved and it was denied on 1/22. We completed a appeal letter on 01/13/23. Informed the patient that this can take up to 30 business days before we get a response. Pt asked about her apt for 2/1. I advised that I didn't want to cancel in case we get a determination in time but if insurance has not approved the medication, we would need to cancel the appt. Pt states that she will call 1/30 if she has not heard from anyone and cancel the apt. I will document in the apt notes as well.

## 2023-01-14 NOTE — Telephone Encounter (Signed)
PA APPROVED  PA Case: [id number], Status: Approved, Coverage Starts on: 12/23/2022 12:00:00 AM, Coverage Ends on: 12/23/2023 12:00:00 AM

## 2023-01-15 ENCOUNTER — Other Ambulatory Visit (HOSPITAL_COMMUNITY): Payer: Self-pay

## 2023-01-15 NOTE — Telephone Encounter (Addendum)
Pharmacy Patient Advocate Encounter  Prior Authorization for Botox 200 units has been approved.    PA# PA Case ID #: [id number] Effective dates: 12/23/2022 through 12/23/2023  [employer] and [employer] [place]

## 2023-01-15 NOTE — Telephone Encounter (Signed)
Apt already scheduled and edit apt notes to reflect patient is approved as B/B

## 2023-01-15 NOTE — Telephone Encounter (Deleted)
Since it is Medicare-she might be better off doing buy and bill-I am still verifying medical and will update once completed.

## 2023-01-16 ENCOUNTER — Ambulatory Visit: Payer: Medicare PPO | Admitting: Psychiatry

## 2023-01-23 ENCOUNTER — Ambulatory Visit: Payer: Medicare PPO | Admitting: Psychiatry

## 2023-01-23 DIAGNOSIS — G43719 Chronic migraine without aura, intractable, without status migrainosus: Secondary | ICD-10-CM | POA: Diagnosis not present

## 2023-01-23 MED ORDER — ONABOTULINUMTOXINA 200 UNITS IJ SOLR
155.0000 [IU] | Freq: Once | INTRAMUSCULAR | Status: AC
  Administered 2023-01-23: 155 [IU] via INTRAMUSCULAR

## 2023-01-23 NOTE — Progress Notes (Signed)
Botox- 200 units x 1 vial Lot: [id number] Expiration: 05/2025 NDC: 9379-0240-97  Bacteriostatic 0.9% Sodium Chloride- 69m total Lot: [id number]: 11/25 NDC: 642683-419-62 Dx: GI29.798B/B

## 2023-01-23 NOTE — Progress Notes (Signed)
[facility]  BOTULINUM TOXIN INJECTION PROCEDURE NOTE  Patient: [name] MRN: [id number]  Indication: Chronic migraines   History: 62 year old female with a history of bipolar disorder, tremor, meningioma who follows in clinic for chronic migraines. Headaches remain well-controlled on Botox. She will have a wearing off period 2-3 weeks prior to her next injection where headaches start to return.  Last injection: 10/24/22  Technique: Informed consent was obtained and signed. 200 units onabotulinumtoxinA (Lot# [id number]; Expiration: 05/2025) were reconstituted using normal saline, to a concentration of 5 units per 0.6m. 155 units were injected using sterile technique across 31 sites as follows: corrugator 10, procerus 5 units, frontalis 20 units, temporalis 40 units, occipitalis 30 units, cervical paraspinal 20 units, trapezius 30 units. 45 units were wasted. Patient tolerated procedure without complication.  Prior Therapies                                 Prevention: Gabapentin - anaphylaxis Oxcarbazepine - anaphylaxis Carbamazepine - nausea and vomiting Depakote - swelling Phenytoin - swelling, rash Aimovig Emgality - joint pain Metoprolol 100 mg daily Propranolol amlodipine Botox (for migraines)   Rescue: Excedrin Imitrex 50 mg PRN Maxalt 10 mg PRN Nurtec 70 mg PRN Baclofen   Plan: -Continue Maxalt and Nurtec for migraine rescue -Continue Botox for migraine prevention -Continue baclofen 5-10 mg PRN for facial pain

## 2023-01-23 NOTE — Progress Notes (Signed)
   Established Patient Office Visit  Subjective   Patient ID: [name], female    DOB: 02-09-1961  Age: 62 y.o. MRN: [id number]  Chief Complaint  Patient presents with   Room 1    Pt is here with her Husband. Pt hasn't had any blood thinners.     HPI    ROS    Objective:     There were no vitals taken for this visit.   Physical Exam   No results found for any visits on 01/23/23.    The ASCVD Risk score (Arnett DK, et al., 2019) failed to calculate for the following reasons:   Cannot find a previous HDL lab   Cannot find a previous total cholesterol lab    Assessment & Plan:   Problem List Items Addressed This Visit   None   No follow-ups on file.    Linna Hoff, CMA

## 2023-02-13 ENCOUNTER — Other Ambulatory Visit (HOSPITAL_COMMUNITY): Payer: Self-pay | Admitting: Psychiatry

## 2023-02-13 DIAGNOSIS — F411 Generalized anxiety disorder: Secondary | ICD-10-CM

## 2023-02-13 DIAGNOSIS — F319 Bipolar disorder, unspecified: Secondary | ICD-10-CM

## 2023-02-24 ENCOUNTER — Encounter: Payer: Self-pay | Admitting: Psychiatry

## 2023-02-28 ENCOUNTER — Telehealth (HOSPITAL_BASED_OUTPATIENT_CLINIC_OR_DEPARTMENT_OTHER): Payer: Medicare PPO | Admitting: Psychiatry

## 2023-02-28 ENCOUNTER — Encounter (HOSPITAL_COMMUNITY): Payer: Self-pay | Admitting: Psychiatry

## 2023-02-28 VITALS — Wt 203.0 lb

## 2023-02-28 DIAGNOSIS — F09 Unspecified mental disorder due to known physiological condition: Secondary | ICD-10-CM | POA: Diagnosis not present

## 2023-02-28 DIAGNOSIS — F319 Bipolar disorder, unspecified: Secondary | ICD-10-CM

## 2023-02-28 DIAGNOSIS — F411 Generalized anxiety disorder: Secondary | ICD-10-CM

## 2023-02-28 MED ORDER — HYDROXYZINE HCL 25 MG PO TABS: ORAL_TABLET | ORAL | 0 refills | Status: DC

## 2023-02-28 MED ORDER — RISPERIDONE 1 MG PO TABS: ORAL_TABLET | ORAL | 0 refills | Status: DC

## 2023-02-28 MED ORDER — BUPROPION HCL ER (XL) 300 MG PO TB24: 300.0000 mg | ORAL_TABLET | Freq: Every day | ORAL | 0 refills | Status: DC

## 2023-02-28 MED ORDER — CLONAZEPAM 1 MG PO TABS: 1.0000 mg | ORAL_TABLET | Freq: Every day | ORAL | 0 refills | Status: DC

## 2023-02-28 NOTE — Progress Notes (Signed)
[facility] MD Virtual Progress Note   Patient Location: Home Provider Location: Home Office  I connect with patient by telephone and verified that I am speaking with correct person by using two identifiers. I discussed the limitations of evaluation and management by telemedicine and the availability of in person appointments. I also discussed with the patient that there may be a patient responsible charge related to this service. The patient expressed understanding and agreed to proceed.  [name] [id number] 62 y.o.  02/28/2023 10:25 AM  History of Present Illness:  Patient is evaluated by phone session.  She had a good Christmas on holidays.  She admitted getting frustrated and irritable with the neurology appointment.  She feels that her symptoms are not addressed.  She still have balance issues, dizziness, memory issues and she has been requesting to have MRI but it has been denied.  Patient told she was told to do puzzles and she has been doing puzzle but has not seen any improvement in her memory.  She is getting Botox for her headaches.  She feels her mania is under control and she did not have any impulsive behavior, agitation or anger.  She sleeps good with the help of Klonopin and Resporal.  Her family is very supportive.  Her son live close by and she see the grandkids on a regular basis.  She has 1 grandkid on her way.  She has mild tremors and shakes but stable.  She denies any suicidal thoughts.  She admitted does not like to go outside because he is afraid that she may forget things.  Her husband is very supportive.  She like to keep the current medication.  Past Psychiatric History: H/O bipolar disorder and multiple inpatient treatment.  Last inpatient in September 2014 for suicidal thinking, hallucination as playing with guns and superficially cut her wrist with a needle.  H/O mania and psychosis. Tried Zyprexa Seroquel but stopped due to weight gain.  Tegretol  caused increased paranoia confusion and required inpatient.     Outpatient Encounter Medications as of 02/28/2023  Medication Sig   amLODipine (NORVASC) 2.5 MG tablet Take 2.5 mg by mouth daily.   baclofen (LIORESAL) 10 MG tablet Take 1/2-1 pill up to three times a day as needed for facial pain   buPROPion (WELLBUTRIN XL) 300 MG 24 hr tablet Take 1 tablet (300 mg total) by mouth daily. TAKE ONE TABLET BY MOUTH EVERY MORINING FOR DEPRESSION   clonazePAM (KLONOPIN) 1 MG tablet Take 1 tablet (1 mg total) by mouth at bedtime.   hydrOXYzine (ATARAX) 25 MG tablet Take one tab as needed at bedtime.   metoprolol succinate (TOPROL-XL) 50 MG 24 hr tablet Take 100 mg by mouth daily.   mupirocin ointment (BACTROBAN) 2 % Apply 1 application topically 2 (two) times daily.   naproxen (NAPROSYN) 500 MG tablet Take 1 pill twice a day for back pain.   oxyCODONE-acetaminophen (PERCOCET/ROXICET) 5-325 MG tablet Take 1 every 6 hours for pain not relieved by the Naprosyn   prochlorperazine (COMPAZINE) 10 MG tablet Take 1 tablet (10 mg total) by mouth 2 (two) times daily as needed for nausea or vomiting.   Rimegepant Sulfate (NURTEC) 75 MG TBDP Take 1 tablet at the onset of a headache/migraine. No more than 1 tablet in a 24 hour period.   risperiDONE (RISPERDAL) 1 MG tablet Take 1-1/2 tablets (1.5 mg total dose) at bedtime.   rizatriptan (MAXALT) 10 MG tablet Take 1 tablet (10 mg total)  by mouth as needed for migraine. May repeat in 2 hours if needed. Max dose 2 pills in 24 hours   No facility-administered encounter medications on file as of 02/28/2023.    No results found for this or any previous visit (from the past 2160 hour(s)).   Psychiatric Specialty Exam: Physical Exam  Review of Systems  Neurological:  Positive for dizziness, tremors and headaches.    Weight 203 lb (92.1 kg).There is no height or weight on file to calculate BMI.  General Appearance: NA  Eye Contact:  NA  Speech:  Slow  Volume:   Decreased  Mood:  Anxious  Affect:  NA  Thought Process:  Descriptions of Associations: Intact  Orientation:  Full (Time, Place, and Person)  Thought Content:  Rumination  Suicidal Thoughts:  No  Homicidal Thoughts:  No  Memory:  Immediate;   Fair Recent;   Fair Remote;   Fair  Judgement:  Intact  Insight:  Present  Psychomotor Activity:  Tremor  Concentration:  Concentration: Fair and Attention Span: Fair  Recall:  [employer] of Knowledge:  Fair  Language:  Good  Akathisia:  No  Handed:  Right  AIMS (if indicated):     Assets:  Communication Skills Desire for Improvement Housing Social Support  ADL's:  Intact  Cognition:  Impaired,  Mild  Sleep:  ok     Assessment/Plan: Bipolar I disorder (HCC) - Plan: buPROPion (WELLBUTRIN XL) 300 MG 24 hr tablet, clonazePAM (KLONOPIN) 1 MG tablet, risperiDONE (RISPERDAL) 1 MG tablet  GAD (generalized anxiety disorder) - Plan: buPROPion (WELLBUTRIN XL) 300 MG 24 hr tablet, hydrOXYzine (ATARAX) 25 MG tablet  Mild cognitive disorder  Discussed health concerns especially more symptoms related to neurology.  Patient frustrated as not making any progress and thinking about getting a second opinion.  I encouraged to consider a second opinion since has not seen any improvement in her memory, dizziness and balance.  She does not want to change the medication since she feels her mania and anxiety and depression is stable.  Continue Wellbutrin XL 300 mg in the morning, Klonopin 1 mg at bedtime, Risperdal 1.5 mg at bedtime and hydroxyzine 25 mg daily.  She is not interested in therapy.  Recommend to call us back if she has any question or any concern.  Follow-up in 3 months.  Follow Up Instructions:     I discussed the assessment and treatment plan with the patient. The patient was provided an opportunity to ask questions and all were answered. The patient agreed with the plan and demonstrated an understanding of the instructions.   The patient was  advised to call back or seek an in-person evaluation if the symptoms worsen or if the condition fails to improve as anticipated.    Collaboration of Care: Other provider involved in patient's care AEB notes are available in epic to review.  Patient/Guardian was advised Release of Information must be obtained prior to any record release in order to collaborate their care with an outside provider. Patient/Guardian was advised if they have not already done so to contact the registration department to sign all necessary forms in order for [place] to release information regarding their care.   Consent: Patient/Guardian gives verbal consent for treatment and assignment of benefits for services provided during this visit. Patient/Guardian expressed understanding and agreed to proceed.     I provided 18 minutes of non face to face time during this encounter.  [name], MD 02/28/2023

## 2023-03-26 ENCOUNTER — Telehealth: Payer: Self-pay | Admitting: Psychiatry

## 2023-03-26 DIAGNOSIS — H539 Unspecified visual disturbance: Secondary | ICD-10-CM

## 2023-03-26 MED ORDER — QULIPTA 30 MG PO TABS: 30.0000 mg | ORAL_TABLET | Freq: Every day | ORAL | 6 refills | Status: DC

## 2023-03-26 NOTE — Telephone Encounter (Signed)
Phone room- did you call and notify her of the approval?

## 2023-03-26 NOTE — Telephone Encounter (Signed)
Typically I would only order an MRI if she is noticing an increase in headache frequency/severity or other new neurological symptoms like numbness, weakness, or vision changes. Is she having any symptoms like that? If so I can place an MRI order for her

## 2023-03-26 NOTE — Telephone Encounter (Signed)
PA Qulipta submitted on covermymeds. Key: [id number]. Received instant approval: "PA Case: [id number], Status: Approved, Coverage Starts on: 12/23/2022 12:00:00 AM, Coverage Ends on: 12/23/2023 12:00:00 AM. Questions? Contact [phone]."

## 2023-03-26 NOTE — Telephone Encounter (Signed)
Called pt to informed her that [name] was approved through insurance and she could pick up at the pharmacy. Pt said I have a question, Can you tell me why I'm not getting MRI, stated the headaches are changing. Pt is requesting a nurse give her a call.

## 2023-03-26 NOTE — Telephone Encounter (Signed)
Pt informed with below, pt said the frequency of headaches has increased and she has noticed vision changes. She asked the MRI order to be placed at [facility].

## 2023-03-26 NOTE — Telephone Encounter (Signed)
Pt called requesting to speak to the RN to discuss her BOTOX. Pt did not want to give any information.

## 2023-03-26 NOTE — Telephone Encounter (Signed)
Additional information from below. Pt said she feel pressure in her head all the time.This last botox injection in 01/2023 has not helped.

## 2023-03-26 NOTE — Telephone Encounter (Signed)
Pt states since she had her Botox 02-01 she has multiple headaches weekly, pt would like a call to discuss

## 2023-03-26 NOTE — Telephone Encounter (Signed)
Called pt. Relayed Dr. Georgina Peer recommendation. Pt agreeable to plan. Aware it may require a PA via insurance prior to her being able to pick up from the pharmacy. Aware we are adding this in addition to her Botox treatment per Dr. Billey Gosling if insurance allows coverage of both.

## 2023-03-26 NOTE — Telephone Encounter (Signed)
Called pt. She reports headaches that started after 01/23/23 Botox appt are different than her normal migraines that are typically located at the side of her head. Current headaches are now at top of head/front of head.  She is getting about 3-4 headaches per week. Headaches intermittent. Rizatriptan, ibuprofen, Nurtec ineffective.  No other meds started recently, denies any signs of illness/infection currently.   She just received hearing aids. Started using 03/11/23. Tolerating ok.  Aware I will send to Dr. Billey Gosling for review and then call back with her recommendation.

## 2023-03-26 NOTE — Telephone Encounter (Signed)
Order placed, thanks.

## 2023-03-26 NOTE — Telephone Encounter (Signed)
I will try to get [name] approved for her to take for migraine prevention. It had been denied a couple of years ago, but hopefully now they will approve it since she has failed more treatments

## 2023-03-26 NOTE — Addendum Note (Signed)
Addended by: Genia Harold on: 03/26/2023 04:52 PM   Modules accepted: Orders

## 2023-03-26 NOTE — Telephone Encounter (Signed)
Dr. Billey Gosling, pt inquiring about getting MRI Brain. Is this something you would like to proceed with?

## 2023-03-27 ENCOUNTER — Telehealth: Payer: Self-pay | Admitting: Psychiatry

## 2023-03-27 NOTE — Telephone Encounter (Signed)
[name]: [id number] exp. 03/27/23-04/26/23 sent to [facility] [phone]

## 2023-03-28 ENCOUNTER — Encounter: Payer: Self-pay | Admitting: Psychiatry

## 2023-03-31 ENCOUNTER — Ambulatory Visit
Admission: RE | Admit: 2023-03-31 | Discharge: 2023-03-31 | Disposition: A | Discharge status: Home / Self Care | Payer: Medicare PPO | Source: Ambulatory Visit | Attending: Psychiatry | Admitting: Psychiatry

## 2023-03-31 DIAGNOSIS — H539 Unspecified visual disturbance: Secondary | ICD-10-CM

## 2023-03-31 MED ORDER — GADOPICLENOL 0.5 MMOL/ML IV SOLN
10.0000 mL | Freq: Once | INTRAVENOUS | Status: AC | PRN
  Administered 2023-03-31: 10 mL via INTRAVENOUS

## 2023-03-31 NOTE — Telephone Encounter (Signed)
Patient wanted the location changed to [facility]. Francine Graven Berkley Harvey: [phone] exp. 03/31/23-04/30/23

## 2023-04-05 LAB — LAB REPORT - SCANNED
A1c: 5.3
EGFR (Non-African Amer.): 50

## 2023-04-17 ENCOUNTER — Ambulatory Visit: Payer: Medicare PPO | Admitting: Neurology

## 2023-05-08 ENCOUNTER — Ambulatory Visit: Payer: Medicare PPO | Admitting: Neurology

## 2023-05-08 ENCOUNTER — Telehealth (HOSPITAL_COMMUNITY): Payer: Self-pay | Admitting: *Deleted

## 2023-05-08 NOTE — Telephone Encounter (Signed)
Thanks for the update.  Can be get the recent blood work results from her primary care.

## 2023-05-08 NOTE — Telephone Encounter (Signed)
Pt called requesting a referral to [facility] in [place]. Pt says her PCP told her that she needed an Abd [place] r/t being in CKD as evidenced by recent blood work. We are not able to access labs or any notes from PCP. Writer spoke with PCP office who stated there have been no referrals made since 11/23 and that was to Urology. Pt left a message for the nurse asking them to call pt regarding this issue. Pt says she will probably go to [facility] ED. Writer reinofrced Ed or UC for f/u. Pt verbalized understanding.

## 2023-05-09 NOTE — Telephone Encounter (Signed)
LVM requesting labs to our fax # [phone]. Office phone # provided as well.

## 2023-05-15 ENCOUNTER — Other Ambulatory Visit (HOSPITAL_COMMUNITY): Payer: Self-pay | Admitting: Psychiatry

## 2023-05-15 DIAGNOSIS — F319 Bipolar disorder, unspecified: Secondary | ICD-10-CM

## 2023-05-15 DIAGNOSIS — F411 Generalized anxiety disorder: Secondary | ICD-10-CM

## 2023-06-02 ENCOUNTER — Encounter (HOSPITAL_COMMUNITY): Payer: Self-pay | Admitting: Psychiatry

## 2023-06-02 ENCOUNTER — Telehealth (HOSPITAL_BASED_OUTPATIENT_CLINIC_OR_DEPARTMENT_OTHER): Payer: Medicare PPO | Admitting: Psychiatry

## 2023-06-02 VITALS — Wt 203.0 lb

## 2023-06-02 DIAGNOSIS — F319 Bipolar disorder, unspecified: Secondary | ICD-10-CM

## 2023-06-02 DIAGNOSIS — F411 Generalized anxiety disorder: Secondary | ICD-10-CM | POA: Diagnosis not present

## 2023-06-02 MED ORDER — BUPROPION HCL ER (XL) 300 MG PO TB24: 300.0000 mg | ORAL_TABLET | Freq: Every day | ORAL | 0 refills | Status: DC

## 2023-06-02 MED ORDER — HYDROXYZINE HCL 25 MG PO TABS: ORAL_TABLET | ORAL | 0 refills | Status: DC

## 2023-06-02 MED ORDER — RISPERIDONE 1 MG PO TABS: ORAL_TABLET | ORAL | 0 refills | Status: DC

## 2023-06-02 MED ORDER — CLONAZEPAM 1 MG PO TABS: 1.0000 mg | ORAL_TABLET | Freq: Every day | ORAL | 0 refills | Status: DC

## 2023-06-02 NOTE — Progress Notes (Signed)
[facility] MD Virtual Progress Note   Patient Location: Home Provider Location: Home Office  I connect with patient by telephone and verified that I am speaking with correct person by using two identifiers. I discussed the limitations of evaluation and management by telemedicine and the availability of in person appointments. I also discussed with the patient that there may be a patient responsible charge related to this service. The patient expressed understanding and agreed to proceed.  [name] [id number] 62 y.o.  06/02/2023 10:07 AM  History of Present Illness:  Patient is evaluated by phone session.  She is taking Klonopin, hydroxyzine, risperidone and Wellbutrin.  She feels the combination is working but is still have a lot of physical concern.  She has headaches, some time tremors and she noticed there are few occasions when she Stumble.  She does not use any walker.  She had MRI of her brain which shows chronic microvascular ischemia but it was unchanged from 2021.  She had blood work in April which shows creatinine 1.24, BUN 12, hemoglobin A1c 5.3 and WBC count 7.8.  She is seeing nephrology and had an upcoming appointment for renal ultrasound.  She is seeing Dr. Marylu Lund in [place] but not happy because she does not get referral and she need to initiate by herself to see the cardiology and nephrology.  She still sometimes struggle with attention and concentration and memory issues but he symptoms are manageable.  She denies any mania, psychosis, crying spells or any feeling of hopelessness or worthlessness.  She has no recent impulsive behavior or anger.  Her son lives close by and she see grandkids on a regular basis.  She stopped driving and her husband takes her to the doctor's appointment.  She does not want to change the medication because she feels the combination is keeping her stable.  Her appetite is okay.  Her weight is unchanged from the past.  Past  Psychiatric History: H/O bipolar disorder and multiple inpatient treatment.  Last inpatient in September 2014 for suicidal thinking, hallucination as playing with guns and superficially cut her wrist with a needle.  H/O mania and psychosis. Tried Zyprexa Seroquel but stopped due to weight gain.  Tegretol caused increased paranoia confusion and required inpatient.     Outpatient Encounter Medications as of 06/02/2023  Medication Sig   amLODipine (NORVASC) 2.5 MG tablet Take 2.5 mg by mouth daily.   Atogepant (QULIPTA) 30 MG TABS Take 1 tablet (30 mg total) by mouth daily.   baclofen (LIORESAL) 10 MG tablet Take 1/2-1 pill up to three times a day as needed for facial pain   buPROPion (WELLBUTRIN XL) 300 MG 24 hr tablet Take 1 tablet (300 mg total) by mouth daily. TAKE ONE TABLET BY MOUTH EVERY MORINING FOR DEPRESSION   clonazePAM (KLONOPIN) 1 MG tablet Take 1 tablet (1 mg total) by mouth at bedtime.   hydrOXYzine (ATARAX) 25 MG tablet Take one tab as needed at bedtime.   metoprolol succinate (TOPROL-XL) 50 MG 24 hr tablet Take 100 mg by mouth daily.   mupirocin ointment (BACTROBAN) 2 % Apply 1 application topically 2 (two) times daily.   naproxen (NAPROSYN) 500 MG tablet Take 1 pill twice a day for back pain.   oxyCODONE-acetaminophen (PERCOCET/ROXICET) 5-325 MG tablet Take 1 every 6 hours for pain not relieved by the Naprosyn   prochlorperazine (COMPAZINE) 10 MG tablet Take 1 tablet (10 mg total) by mouth 2 (two) times daily as needed for nausea  or vomiting.   Rimegepant Sulfate (NURTEC) 75 MG TBDP Take 1 tablet at the onset of a headache/migraine. No more than 1 tablet in a 24 hour period.   risperiDONE (RISPERDAL) 1 MG tablet Take 1-1/2 tablets (1.5 mg total dose) at bedtime.   rizatriptan (MAXALT) 10 MG tablet Take 1 tablet (10 mg total) by mouth as needed for migraine. May repeat in 2 hours if needed. Max dose 2 pills in 24 hours   No facility-administered encounter medications on file as of  06/02/2023.    Recent Results (from the past 2160 hour(s))  Lab report - scanned     Status: None   Collection Time: 04/05/23  2:05 PM  Result Value Ref Range   EGFR (Non-African Amer.) 50     Comment: Abstracted by HIM   A1c 5.3      Psychiatric Specialty Exam: Physical Exam  Review of Systems  Neurological:  Positive for tremors and headaches.  Psychiatric/Behavioral:  Positive for decreased concentration. The patient is nervous/anxious.     Weight 203 lb (92.1 kg).There is no height or weight on file to calculate BMI.  General Appearance: NA  Eye Contact:  NA  Speech:  Slow  Volume:  Decreased  Mood:  Anxious  Affect:  NA  Thought Process:  Goal Directed  Orientation:  Full (Time, Place, and Person)  Thought Content:  Rumination  Suicidal Thoughts:  No  Homicidal Thoughts:  No  Memory:  Immediate;   Fair Recent;   Fair Remote;   Fair  Judgement:  Intact  Insight:  Present  Psychomotor Activity:  NA  Concentration:  Concentration: Fair and Attention Span: Fair  Recall:  Good  Fund of Knowledge:  Good  Language:  Good  Akathisia:  No  Handed:  Right  AIMS (if indicated):     Assets:  Communication Skills Desire for Improvement Housing Social Support  ADL's:  Intact  Cognition:  WNL  Sleep:  fair     Assessment/Plan: Bipolar I disorder (HCC) - Plan: buPROPion (WELLBUTRIN XL) 300 MG 24 hr tablet, clonazePAM (KLONOPIN) 1 MG tablet, risperiDONE (RISPERDAL) 1 MG tablet  GAD (generalized anxiety disorder) - Plan: buPROPion (WELLBUTRIN XL) 300 MG 24 hr tablet, hydrOXYzine (ATARAX) 25 MG tablet  I reviewed blood work results.  Hemoglobin A1c 5.3, creatinine 1.24 and BUN 12.  I discussed chronic symptoms and recommend to cut down the Klonopin and the patient insists that she is doing well and does not want to change the dose.  I explained taking benzodiazepine for a while because memory issues and she has already chronic microvascular ischemia but patient insists to  keep the current dose since her anxiety is under control.  I encourage to keep appointment with nephrology and neurology.  Continue Wellbutrin XL 300 mg in the morning, Klonopin 1 mg at bedtime, risperidone 1.5 mg at bedtime and hydroxyzine 25 mg daily.  Discussed polypharmacy and possible side effects.  Patient not interested in therapy.  Recommend to call us back if she is any question or any concern.  Follow-up in 3 months.   Follow Up Instructions:     I discussed the assessment and treatment plan with the patient. The patient was provided an opportunity to ask questions and all were answered. The patient agreed with the plan and demonstrated an understanding of the instructions.   The patient was advised to call back or seek an in-person evaluation if the symptoms worsen or if the condition fails to improve as  anticipated.    Collaboration of Care: Other provider involved in patient's care AEB notes are available in epic to review.  Patient/Guardian was advised Release of Information must be obtained prior to any record release in order to collaborate their care with an outside provider. Patient/Guardian was advised if they have not already done so to contact the registration department to sign all necessary forms in order for [place] to release information regarding their care.   Consent: Patient/Guardian gives verbal consent for treatment and assignment of benefits for services provided during this visit. Patient/Guardian expressed understanding and agreed to proceed.     I provided 22 minutes of non face to face time during this encounter.  Note: This document was prepared by [employer] voice dictation technology and any errors that results from this process are unintentional.    Cleotis Nipper, MD 06/02/2023

## 2023-06-24 ENCOUNTER — Ambulatory Visit: Payer: Medicare PPO | Admitting: Neurology

## 2023-06-29 ENCOUNTER — Emergency Department (HOSPITAL_COMMUNITY): Payer: Medicare PPO

## 2023-06-29 ENCOUNTER — Other Ambulatory Visit: Payer: Self-pay

## 2023-06-29 ENCOUNTER — Emergency Department (HOSPITAL_COMMUNITY)
Admission: EM | Admit: 2023-06-29 | Discharge: 2023-06-29 | Disposition: A | Discharge status: Home / Self Care | Payer: Medicare PPO | Attending: Emergency Medicine | Admitting: Emergency Medicine

## 2023-06-29 ENCOUNTER — Encounter (HOSPITAL_COMMUNITY): Payer: Self-pay | Admitting: *Deleted

## 2023-06-29 DIAGNOSIS — Z79899 Other long term (current) drug therapy: Secondary | ICD-10-CM | POA: Diagnosis not present

## 2023-06-29 DIAGNOSIS — R0789 Other chest pain: Secondary | ICD-10-CM | POA: Diagnosis not present

## 2023-06-29 DIAGNOSIS — E876 Hypokalemia: Secondary | ICD-10-CM | POA: Insufficient documentation

## 2023-06-29 DIAGNOSIS — I10 Essential (primary) hypertension: Secondary | ICD-10-CM | POA: Insufficient documentation

## 2023-06-29 DIAGNOSIS — R600 Localized edema: Secondary | ICD-10-CM | POA: Diagnosis not present

## 2023-06-29 DIAGNOSIS — R079 Chest pain, unspecified: Secondary | ICD-10-CM

## 2023-06-29 HISTORY — DX: Disorder of kidney and ureter, unspecified: N28.9

## 2023-06-29 LAB — BASIC METABOLIC PANEL
Anion gap: 11 (ref 5–15)
BUN: 21 mg/dL — ABNORMAL HIGH (ref 6–20)
CO2: 28 mmol/L (ref 22–32)
Calcium: 9.6 mg/dL (ref 8.9–10.3)
Chloride: 96 mmol/L — ABNORMAL LOW (ref 98–111)
Creatinine, Ser: 1.05 mg/dL — ABNORMAL HIGH (ref 0.44–1.00)
GFR, Estimated: >60 mL/min (ref >60)
Glucose, Bld: 118 mg/dL — ABNORMAL HIGH (ref 70–99)
Potassium: 2.5 mmol/L — CL (ref 3.5–5.1)
Sodium: 135 mmol/L (ref 135–145)

## 2023-06-29 LAB — CBC WITH DIFFERENTIAL/PLATELET
Abs Immature Granulocytes: 0.03 10*3/uL (ref 0.00–0.07)
Basophils Absolute: 0.1 10*3/uL (ref 0.0–0.1)
Basophils Relative: 1 %
Eosinophils Absolute: 0.1 10*3/uL (ref 0.0–0.5)
Eosinophils Relative: 1 %
HCT: 44.5 % (ref 36.0–46.0)
Hemoglobin: 15.6 g/dL — ABNORMAL HIGH (ref 12.0–15.0)
Immature Granulocytes: 0 %
Lymphocytes Relative: 22 %
Lymphs Abs: 2.3 10*3/uL (ref 0.7–4.0)
MCH: 30.4 pg (ref 26.0–34.0)
MCHC: 35.1 g/dL (ref 30.0–36.0)
MCV: 86.7 fL (ref 80.0–100.0)
Monocytes Absolute: 0.8 10*3/uL (ref 0.1–1.0)
Monocytes Relative: 8 %
Neutro Abs: 7.1 10*3/uL (ref 1.7–7.7)
Neutrophils Relative %: 68 %
Platelets: 343 10*3/uL (ref 150–400)
RBC: 5.13 MIL/uL — ABNORMAL HIGH (ref 3.87–5.11)
RDW: 12.1 % (ref 11.5–15.5)
WBC: 10.3 10*3/uL (ref 4.0–10.5)
nRBC: 0 % (ref 0.0–0.2)

## 2023-06-29 LAB — D-DIMER, QUANTITATIVE: D-Dimer, Quant: <0.27 ug/mL-FEU (ref 0.00–0.50)

## 2023-06-29 LAB — BRAIN NATRIURETIC PEPTIDE: B Natriuretic Peptide: 20 pg/mL (ref 0.0–100.0)

## 2023-06-29 LAB — MAGNESIUM: Magnesium: 2.4 mg/dL (ref 1.7–2.4)

## 2023-06-29 LAB — TROPONIN I (HIGH SENSITIVITY)
Troponin I (High Sensitivity): 2 ng/L (ref <18)
Troponin I (High Sensitivity): 2 ng/L (ref <18)

## 2023-06-29 MED ORDER — POTASSIUM CHLORIDE 10 MEQ/100ML IV SOLN
10.0000 meq | Freq: Once | INTRAVENOUS | Status: AC
  Administered 2023-06-29: 10 meq via INTRAVENOUS
  Filled 2023-06-29: qty 100

## 2023-06-29 MED ORDER — POTASSIUM CHLORIDE CRYS ER 20 MEQ PO TBCR
40.0000 meq | EXTENDED_RELEASE_TABLET | Freq: Once | ORAL | Status: AC
  Administered 2023-06-29: 40 meq via ORAL
  Filled 2023-06-29: qty 2

## 2023-06-29 MED ORDER — SODIUM CHLORIDE 0.9 % IV SOLN: Freq: Once | INTRAVENOUS | Status: AC

## 2023-06-29 NOTE — ED Notes (Signed)
Pt moved arm around and stated that the heaviness is lifted Pt requested discharge Informed MD

## 2023-06-29 NOTE — ED Notes (Signed)
Potassium completed Pt complains of RIGHT arm heaviness Denies CP and pain in ARM  Vitals listed, informed MD

## 2023-06-29 NOTE — ED Triage Notes (Signed)
Pt with mid upper back pain that radiates to chest, states her left arm feels funny, denies numbness.

## 2023-06-29 NOTE — ED Notes (Signed)
Pt able to walk from lobby to ED room 11 without assistance or difficulty Pt complains of pain the is center of the back that radiates through to chest Pain started at 10am  Sharp pain that is reproducible on palpation  Deep breathing makes is better  Increased SOB x2 months Diarrhea started this morning.  Denies ABD pain N/V Denies HA, Dizziness and blurred vision  Labs and 12 lead completed in triage.  Attached pt to full monitor Noted low K+ added mag to labs Waiting on MD eval

## 2023-06-29 NOTE — Discharge Instructions (Addendum)
Please make sure that you follow-up with your appointment on Tuesday with the cardiologist.  Your testing today shows no signs of heart attack however if you develop severe or worsening symptoms I do want you to return to the emergency department immediately.  Please take a baby aspirin daily

## 2023-06-29 NOTE — ED Provider Notes (Signed)
[place] EMERGENCY DEPARTMENT AT [facility] Provider Note   CSN: [id number] Arrival date & time: 06/29/23  1253     History  Chief Complaint  Patient presents with   Chest Pain    [name] is a 62 y.o. female.   Chest Pain    ` This patient is a 62 year old female with a history of hypertension on amlodipine, metoprolol, history of bipolar disorder and a history of anxiety, the patient reports that she recently started having increasing shortness of breath over the last few months, this comes on with exertion or even with talking and had been placed on furosemide by her doctor thinking that it may be related to the fluid retention however this is not helped and today while she was sitting on her couch watching TV, she had acute onset of the pain going from her back to her chest with some symptoms in her left arm, she felt more short of breath and was in quite distressed.  She states that this since has almost resolved although she still has a small residual discomfort in her chest, this is unusual and she has never had anything like that before.  Shortness of breath continues to be progressive and she does have shortness of breath at rest.  She denies any history of venous thromboembolism or myocardial infarction and has never seen a cardiologist though she is scheduled to see Dr. Susie Cassette locally.  Home Medications Prior to Admission medications   Medication Sig Start Date End Date Taking? Authorizing Provider  amLODipine (NORVASC) 2.5 MG tablet Take 2.5 mg by mouth daily. 02/16/20  Yes [provider]  buPROPion (WELLBUTRIN XL) 300 MG 24 hr tablet Take 1 tablet (300 mg total) by mouth daily. TAKE ONE TABLET BY MOUTH EVERY MORINING FOR DEPRESSION 06/02/23  Yes Arfeen, Phillips Grout, MD  clonazePAM (KLONOPIN) 1 MG tablet Take 1 tablet (1 mg total) by mouth at bedtime. 06/02/23  Yes Arfeen, Phillips Grout, MD  hydrOXYzine (ATARAX) 25 MG tablet Take one tab as needed at bedtime.  06/02/23  Yes Arfeen, Phillips Grout, MD  metoprolol succinate (TOPROL-XL) 50 MG 24 hr tablet Take 100 mg by mouth daily. 04/18/21  Yes [provider]  risperiDONE (RISPERDAL) 1 MG tablet Take 1-1/2 tablets (1.5 mg total dose) at bedtime. 06/02/23  Yes Arfeen, Phillips Grout, MD  prochlorperazine (COMPAZINE) 10 MG tablet Take 1 tablet (10 mg total) by mouth 2 (two) times daily as needed for nausea or vomiting. 04/19/21   Benjiman Core, MD      Allergies    Gabapentin, Oxcarbazepine, Tegretol [carbamazepine], Codeine, Divalproex sodium, Erythromycin, Phenytoin sodium extended, and Phenytoin sodium extended    Review of Systems   Review of Systems  Cardiovascular:  Positive for chest pain.  All other systems reviewed and are negative.   Physical Exam Updated Vital Signs BP 107/80 (BP Location: Left Arm)   Pulse 65   Temp (!) 97.3 F (36.3 C) (Oral)   Resp 12   Ht 1.6 m (5\' 3" )   Wt 90.7 kg   SpO2 100%   BMI 35.43 kg/m  Physical Exam Vitals and nursing note reviewed.  Constitutional:      General: She is not in acute distress.    Appearance: She is well-developed.  HENT:     Head: Normocephalic and atraumatic.     Mouth/Throat:     Pharynx: No oropharyngeal exudate.  Eyes:     General: No scleral icterus.  Right eye: No discharge.        Left eye: No discharge.     Conjunctiva/sclera: Conjunctivae normal.     Pupils: Pupils are equal, round, and reactive to light.  Neck:     Thyroid: No thyromegaly.     Vascular: No JVD.  Cardiovascular:     Rate and Rhythm: Normal rate and regular rhythm.     Heart sounds: Normal heart sounds. No murmur heard.    No friction rub. No gallop.  Pulmonary:     Effort: Pulmonary effort is normal. No respiratory distress.     Breath sounds: Normal breath sounds. No wheezing or rales.  Abdominal:     General: Bowel sounds are normal. There is no distension.     Palpations: Abdomen is soft. There is no mass.     Tenderness: There is no  abdominal tenderness.  Musculoskeletal:        General: No tenderness. Normal range of motion.     Cervical back: Normal range of motion and neck supple.     Right lower leg: Edema present.     Left lower leg: Edema present.  Lymphadenopathy:     Cervical: No cervical adenopathy.  Skin:    General: Skin is warm and dry.     Findings: No erythema or rash.  Neurological:     Mental Status: She is alert.     Coordination: Coordination normal.  Psychiatric:        Behavior: Behavior normal.     ED Results / Procedures / Treatments   Labs (all labs ordered are listed, but only abnormal results are displayed) Labs Reviewed  CBC WITH DIFFERENTIAL/PLATELET - Abnormal; Notable for the following components:      Result Value   RBC 5.13 (*)    Hemoglobin 15.6 (*)    All other components within normal limits  BASIC METABOLIC PANEL - Abnormal; Notable for the following components:   Potassium 2.5 (*)    Chloride 96 (*)    Glucose, Bld 118 (*)    BUN 21 (*)    Creatinine, Ser 1.05 (*)    All other components within normal limits  MAGNESIUM  D-DIMER, QUANTITATIVE  BRAIN NATRIURETIC PEPTIDE  TROPONIN I (HIGH SENSITIVITY)  TROPONIN I (HIGH SENSITIVITY)    EKG EKG Interpretation Date/Time:  Sunday June 29 2023 12:59:46 EDT Ventricular Rate:  84 PR Interval:  146 QRS Duration:  86 QT Interval:  408 QTC Calculation: 482 R Axis:   22  Text Interpretation: Normal sinus rhythm ST & T wave abnormality, consider anterolateral ischemia Prolonged QT Abnormal ECG When compared with ECG of 07-Sep-2022 22:14, ST abnormality now seen Confirmed by Eber Hong ([id number]) on 06/29/2023 2:52:58 PM  Radiology DG Chest 2 View  Result Date: 06/29/2023 CLINICAL DATA:  Chest pain EXAM: CHEST - 2 VIEW COMPARISON:  09/07/2022 FINDINGS: The heart size and mediastinal contours are within normal limits. Slightly low lung volumes. No focal airspace consolidation, pleural effusion, or pneumothorax. The  visualized skeletal structures are unremarkable. IMPRESSION: No active cardiopulmonary disease. Electronically Signed   By: Duanne Guess D.O.   On: 06/29/2023 13:25    Procedures Procedures    Medications Ordered in ED Medications  potassium chloride 10 mEq in 100 mL IVPB (0 mEq Intravenous Stopped 06/29/23 1625)  0.9 %  sodium chloride infusion (0 mLs Intravenous Stopped 06/29/23 1726)  potassium chloride 10 mEq in 100 mL IVPB (0 mEq Intravenous Stopped 06/29/23 1726)  potassium chloride SA (KLOR-CON  M) CR tablet 40 mEq (40 mEq Oral Given 06/29/23 1626)    ED Course/ Medical Decision Making/ A&P                             Medical Decision Making Amount and/or Complexity of Data Reviewed Labs: ordered. Radiology: ordered.  Risk Prescription drug management.    This patient presents to the ED for concern of chest pain and shortness of breath, this involves an extensive number of treatment options, and is a complaint that carries with it a high risk of complications and morbidity.  The differential diagnosis includes acute coronary syndrome, unstable angina, pulmonary embolism though that seems less likely as she is not tachycardic or hypoxic, could be underlying anxiety and bipolar disorder, could be medication related, she is hypokalemic with a potassium of 2.5 after being placed on a diuretic.  Potassium will be replaced   Co morbidities that complicate the patient evaluation  Hypertension, obesity   Additional history obtained:  Additional history obtained from medical record, the patient's primary care doctor is in [place], she is followed by behavioral health, no recent admissions to the hospital External records from outside source obtained and reviewed including no recent admissions to the hospital   Lab Tests:  I Ordered, and personally interpreted labs.  The pertinent results include: Troponin metabolic panel and CBC reassuring, the patient has no evidence of chronic  kidney disease on her lab work today, her troponin is 2   Imaging Studies ordered:  I ordered imaging studies including chest x-ray I independently visualized and interpreted imaging which showed no acute findings I agree with the radiologist interpretation   Cardiac Monitoring: / EKG:  The patient was maintained on a cardiac monitor.  I personally viewed and interpreted the cardiac monitored which showed an underlying rhythm of: Sinus rhythm without arrhythmia   Consultations Obtained:  I requested consultation with the cardiologist Dr. Nelly Laurence,  and discussed lab and imaging findings as well as pertinent plan - they recommend: home with cardiology f/u and replace K, low risk for ACS.   Problem List / ED Course / Critical interventions / Medication management  Symptoms are minimal, the dyspnea on exertion is likely anxiety, I cannot rule out obstructive coronary disease however given her unremarkable workup here with 2 completely negative troponin levels which are unchanged with no delta change as well as an EKG that is nonspecific and more consistent with hypokalemia she can follow-up outpatient at cardiology's recommendation I ordered medication including potassium supplementation both IV and by mouth for hypokalemia Reevaluation of the patient after these medicines showed that the patient improved I have reviewed the patients home medicines and have made adjustments as needed   Social Determinants of Health:  Underlying bipolar disorder   Test / Admission - Considered:  Considered admission but cardiology defers and suggest patient can follow-up outpatient, I think this is reasonable   I have discussed with the patient at the bedside the results, and the meaning of these results.  They have expressed her understanding to the need for follow-up with primary care physician         Final Clinical Impression(s) / ED Diagnoses Final diagnoses:  Nonspecific chest pain   Hypokalemia    Rx / DC Orders ED Discharge Orders     None         Eber Hong, MD 06/29/23 [id number]

## 2023-07-01 ENCOUNTER — Encounter: Payer: Self-pay | Admitting: Internal Medicine

## 2023-07-01 ENCOUNTER — Ambulatory Visit: Payer: Medicare PPO | Attending: Internal Medicine | Admitting: Internal Medicine

## 2023-07-01 VITALS — BP 115/84 | HR 83 | Ht 63.0 in | Wt 204.6 lb

## 2023-07-01 DIAGNOSIS — R079 Chest pain, unspecified: Secondary | ICD-10-CM | POA: Insufficient documentation

## 2023-07-01 DIAGNOSIS — I10 Essential (primary) hypertension: Secondary | ICD-10-CM

## 2023-07-01 DIAGNOSIS — R0609 Other forms of dyspnea: Secondary | ICD-10-CM

## 2023-07-01 MED ORDER — TORSEMIDE 20 MG PO TABS: 20.0000 mg | ORAL_TABLET | Freq: Every day | ORAL | 1 refills | Status: DC

## 2023-07-01 MED ORDER — METOPROLOL SUCCINATE ER 50 MG PO TB24: 50.0000 mg | ORAL_TABLET | Freq: Every day | ORAL | 2 refills | Status: DC

## 2023-07-01 MED ORDER — METOPROLOL TARTRATE 50 MG PO TABS: 50.0000 mg | ORAL_TABLET | Freq: Once | ORAL | 0 refills | Status: DC

## 2023-07-01 NOTE — Progress Notes (Signed)
Cardiology Office Note  Date: 07/01/2023   ID: TERIANNA [name], DOB [birth year], MRN [id number]  PCP:  Alinda Deem, MD  Cardiologist:  None Electrophysiologist:  None   Reason for Office Visit: DOE eval   History of Present Illness: [name] is a 62 y.o. female known to have HTN was referred to cardiology clinic for evaluation of DOE.   Patient has ongoing DOE x 2 months with no orthopnea or PND.  Minimal leg swelling. She gets DOE even with talking. Had chest pains 3-4 times in the last 2 months. These chest pains lasts for a few seconds to a minute.  She underwent NM stress test and echocardiogram in 2021 in [place] by her primary cardiologist for similar symptoms.  Echo and NM stress test came out to be normal.  Her symptoms resolved and recurred 2 months ago.  No other symptoms of palpitations, dizziness, syncope.  Denies smoking cigarettes.  No prior history of MI/PCI/CABG.  Denies smoking cigarettes.  Past Medical History:  Diagnosis Date   Anxiety    Bipolar disorder [facility])    Brain tumor (benign) (HCC)    Common migraine with intractable migraine 02/06/2021   Depression    HTN (hypertension)    Kidney disease    Mania (HCC)    Meningioma (HCC)    Migraine    Tremor 02/06/2021   Lithium induced    Past Surgical History:  Procedure Laterality Date   ABDOMINAL HYSTERECTOMY     BRAIN SURGERY     breast tumor removal     INCONTINENCE SURGERY     INTERSTIM IMPLANT PLACEMENT     KNEE SURGERY     right x 2   PLANTAR FASCIECTOMY Left 03/05/16   TUBAL LIGATION      Current Outpatient Medications  Medication Sig Dispense Refill   acetaminophen (TYLENOL) 325 MG tablet Take 650 mg by mouth as needed.     amLODipine (NORVASC) 5 MG tablet Take 5 mg by mouth daily.     aspirin EC 81 MG tablet Take 81 mg by mouth daily. Swallow whole.     buPROPion (WELLBUTRIN XL) 300 MG 24 hr tablet Take 1 tablet (300 mg total) by mouth daily. TAKE ONE TABLET BY MOUTH EVERY  MORINING FOR DEPRESSION 90 tablet 0   clonazePAM (KLONOPIN) 1 MG tablet Take 1 tablet (1 mg total) by mouth at bedtime. 90 tablet 0   cyanocobalamin (VITAMIN B12) 1000 MCG tablet Take 1,000 mcg by mouth daily.     hydrOXYzine (ATARAX) 25 MG tablet Take one tab as needed at bedtime. 90 tablet 0   Melatonin 5 MG CAPS Take 10 mg by mouth at bedtime.     prochlorperazine (COMPAZINE) 10 MG tablet Take 1 tablet (10 mg total) by mouth 2 (two) times daily as needed for nausea or vomiting. 10 tablet 0   risperiDONE (RISPERDAL) 1 MG tablet Take 1-1/2 tablets (1.5 mg total dose) at bedtime. 145 tablet 0   No current facility-administered medications for this visit.   Allergies:  Gabapentin, Oxcarbazepine, Tegretol [carbamazepine], Codeine, Divalproex sodium, Erythromycin, Phenytoin sodium extended, and Phenytoin sodium extended   Social History: The patient  reports that she has never smoked. She has never used smokeless tobacco. She reports that she does not drink alcohol and does not use drugs.   Family History: The patient's family history includes Alcohol abuse in her father; Breast cancer in her mother; Diabetes in her father; Drug abuse in her brother; Mental  illness in her mother; Mental retardation in her father.   ROS:  Please see the history of present illness. Otherwise, complete review of systems is positive for none  All other systems are reviewed and negative.   Physical Exam: VS:  BP 115/84   Pulse 83   Ht 5\' 3"  (1.6 m)   Wt 204 lb 9.6 oz (92.8 kg)   SpO2 96%   BMI 36.24 kg/m , BMI Body mass index is 36.24 kg/m.  Wt Readings from Last 3 Encounters:  07/01/23 204 lb 9.6 oz (92.8 kg)  06/29/23 200 lb (90.7 kg)  11/26/22 203 lb (92.1 kg)    General: Patient appears comfortable at rest. HEENT: Conjunctiva and lids normal, oropharynx clear with moist mucosa. Neck: Supple, no elevated JVP or carotid bruits, no thyromegaly. Lungs: Clear to auscultation, nonlabored breathing at  rest. Cardiac: Regular rate and rhythm, no S3 or significant systolic murmur, no pericardial rub. Abdomen: Soft, nontender, no hepatomegaly, bowel sounds present, no guarding or rebound. Extremities: No pitting edema, distal pulses 2+. Skin: Warm and dry. Musculoskeletal: No kyphosis. Neuropsychiatric: Alert and oriented x3, affect grossly appropriate.  Recent Labwork: 06/29/2023: B Natriuretic Peptide 20.0; BUN 21; Creatinine, Ser 1.05; Hemoglobin 15.6; Magnesium 2.4; Platelets 343; Potassium 2.5; Sodium 135     Component Value Date/Time   CHOL 174 05/13/2013 0938   TRIG 48 05/13/2013 0938   HDL 69 05/13/2013 0938   CHOLHDL 2.5 05/13/2013 0938   VLDL 10 05/13/2013 0938   LDLCALC 95 05/13/2013 6387    Other Studies Reviewed Today:   Assessment and Plan:  # DOE, rule out HFpEF # Chest pain -Ongoing DOE x 2 months with no orthopnea or PND. Minimal leg swelling. Had 3-4 episodes of chest pain in the last 2 months.  These chest pain episodes last for a minute or so.  She had similar symptoms in 2021 for which she underwent echocardiogram and NM stress test in [facility] by Dr. Earna Coder.  Both echo and stress test came out to be normal. Will repeat 2D echocardiogram and obtain CTA cardiac to rule out significant CAD. Patient was also placed on metoprolol succinate 100 mg by Dr. Earna Coder in 2021 for unclear reasons, will wean off the dose to 50 mg once daily due to severe fatigue.  Will switch chlorthalidone to torsemide 20 mg once daily, rule out HFpEF.  # HTN, controlled -Continue amlodipine 5 mg once daily, wean off metoprolol succinate from 100 mg to 50 mg once daily and switch chlorthalidone to torsemide 20 mg once daily.   I have spent a total of 45 minutes with patient reviewing chart, EKGs, labs and examining patient as well as establishing an assessment and plan that was discussed with the patient.  > 50% of time was spent in direct patient care.    Medication Adjustments/Labs and  Tests Ordered: Current medicines are reviewed at length with the patient today.  Concerns regarding medicines are outlined above.   Tests Ordered: Orders Placed This Encounter  Procedures   CT CORONARY MORPH W/CTA COR W/SCORE W/CA W/CM &/OR WO/CM   ECHOCARDIOGRAM COMPLETE     Medication Changes: Meds ordered this encounter  Medications   torsemide (DEMADEX) 20 MG tablet    Sig: Take 1 tablet (20 mg total) by mouth daily. 07/01/2023-New    Dispense:  90 tablet    Refill:  1   metoprolol succinate (TOPROL XL) 50 MG 24 hr tablet    Sig: Take 1 tablet (50 mg total)  by mouth daily. Take with or immediately following a meal.    Dispense:  90 tablet    Refill:  2    07/01/2023-Dose Decrease   metoprolol tartrate (LOPRESSOR) 50 MG tablet    Sig: Take 1 tablet (50 mg total) by mouth once for 1 dose. 07/01/2023-Take 2 hours prior to CTA    Dispense:  1 tablet    Refill:  0      Disposition:  Follow up  6 months  Signed Bernardino Dowell Verne Spurr, MD, 07/01/2023 2:26 PM    [facility] at [facility] [address] [place], [place], [place] [address]

## 2023-07-01 NOTE — Patient Instructions (Addendum)
Medication Instructions:  Your physician has recommended you make the following change in your medication:  Stop taking Chlorthalidone Decrease Metoprolol Succinate from 100 mg to 50 mg once daily Start taking Torsemide 20 mg once daily Continue taking all other medications as prescribed  Labwork: None  Testing/Procedures: Your physician has requested that you have an echocardiogram. Echocardiography is a painless test that uses sound waves to create images of your heart. It provides your doctor with information about the size and shape of your heart and how well your heart's chambers and valves are working. This procedure takes approximately one hour. There are no restrictions for this procedure. Please do NOT wear cologne, perfume, aftershave, or lotions (deodorant is allowed). Please arrive 15 minutes prior to your appointment time.    Your cardiac CT will be scheduled at one of the below locations:   [facility] [address] [place], [place] [address] [phone]  If scheduled at [facility], please arrive at the [facility] and Children's Entrance (Entrance C2) of [facility] 30 minutes prior to test start time. You can use the FREE valet parking offered at entrance C (encouraged to control the heart rate for the test)  Proceed to the [facility] Radiology Department (first floor) to check-in and test prep.  All radiology patients and guests should use entrance C2 at [facility], accessed from [facility], even though the hospital's physical address listed is [address]    If scheduled at [facility] or [facility], please arrive 15 mins early for check-in and test prep. There is spacious parking and easy access to the radiology department from the [facility] Heart and Vascular entrance. Please enter here and check-in with the desk attendant.    Please follow these  instructions carefully (unless otherwise directed):  An IV will be required for this test and Nitroglycerin will be given.  Hold all erectile dysfunction medications at least 3 days (72 hrs) prior to test. (Ie viagra, cialis, sildenafil, tadalafil, etc)   On the Night Before the Test: Be sure to Drink plenty of water. Do not consume any caffeinated/decaffeinated beverages or chocolate 12 hours prior to your test. Do not take any antihistamines 12 hours prior to your test. If the patient has contrast allergy: No allergies Patient will need a prescription for Prednisone and very clear instructions (as follows): Prednisone 50 mg - take 13 hours prior to test Take another Prednisone 50 mg 7 hours prior to test Take another Prednisone 50 mg 1 hour prior to test Take Benadryl 50 mg 1 hour prior to test Patient must complete all four doses of above prophylactic medications. Patient will need a ride after test due to Benadryl.  On the Day of the Test: Drink plenty of water until 1 hour prior to the test. Do not eat any food 1 hour prior to test. You may take your regular medications prior to the test.  Take metoprolol 50 mg(Lopressor) two hours prior to test. If you take Torsemide please HOLD on the morning of the test. FEMALES- please wear underwire-free bra if available, avoid dresses & tight clothing   After the Test: Drink plenty of water. After receiving IV contrast, you may experience a mild flushed feeling. This is normal. On occasion, you may experience a mild rash up to 24 hours after the test. This is not dangerous. If this occurs, you can take Benadryl 25 mg and increase your fluid  intake. If you experience trouble breathing, this can be serious. If it is severe call 911 IMMEDIATELY. If it is mild, please call our office.  We will call to schedule your test 2-4 weeks out understanding that some insurance companies will need an authorization prior to the service being performed.    For more information and frequently asked questions, please visit our website : [email or web address]  For non-scheduling related questions, please contact the cardiac imaging nurse navigator should you have any questions/concerns: Rockwell [place], Cardiac Imaging Nurse Navigator Larey Brick, Cardiac Imaging Nurse Navigator [facility] Direct Office Dial: [phone]   For scheduling needs, including cancellations and rescheduling, please call [place], [phone].   Follow-Up: Your physician recommends that you schedule a follow-up appointment in: 6 months  Any Other Special Instructions Will Be Listed Below (If Applicable).  If you need a refill on your cardiac medications before your next appointment, please call your pharmacy.

## 2023-07-03 ENCOUNTER — Telehealth (HOSPITAL_COMMUNITY): Payer: Self-pay | Admitting: *Deleted

## 2023-07-03 NOTE — Telephone Encounter (Signed)
Patient calling about her upcoming cardiac imaging study; pt verbalizes understanding of appt date/time, parking situation and where to check in, pre-test NPO status and medications ordered, and verified current allergies; name and call back number provided for further questions should they arise  Larey Brick RN Navigator Cardiac Imaging [facility] [phone] office [phone] cell  Patient to hold her amlodipine and torsemide.  She is to take her daily metoprolol and 50mg  metoprolol tartrate two hours prior to her cardiac CT scan.  She is aware to arrive at 11AM.

## 2023-07-08 ENCOUNTER — Ambulatory Visit: Payer: Medicare PPO

## 2023-07-08 ENCOUNTER — Ambulatory Visit (HOSPITAL_COMMUNITY)
Admission: RE | Admit: 2023-07-08 | Discharge: 2023-07-08 | Disposition: A | Discharge status: Home / Self Care | Payer: Medicare PPO | Source: Ambulatory Visit | Attending: Internal Medicine | Admitting: Internal Medicine

## 2023-07-08 DIAGNOSIS — R072 Precordial pain: Secondary | ICD-10-CM | POA: Diagnosis not present

## 2023-07-08 DIAGNOSIS — R079 Chest pain, unspecified: Secondary | ICD-10-CM | POA: Diagnosis not present

## 2023-07-08 DIAGNOSIS — R0609 Other forms of dyspnea: Secondary | ICD-10-CM

## 2023-07-08 DIAGNOSIS — K760 Fatty (change of) liver, not elsewhere classified: Secondary | ICD-10-CM | POA: Diagnosis not present

## 2023-07-08 DIAGNOSIS — I700 Atherosclerosis of aorta: Secondary | ICD-10-CM | POA: Diagnosis not present

## 2023-07-08 LAB — ECHOCARDIOGRAM COMPLETE
AR max vel: 2.26 cm2
AV Area VTI: 2.2 cm2
AV Area mean vel: 2.19 cm2
AV Mean grad: 4 mmHg
AV Peak grad: 6.7 mmHg
Ao pk vel: 1.29 m/s
Area-P 1/2: 3.08 cm2
Calc EF: 56.7 %
MV VTI: 2.65 cm2
S' Lateral: 2.9 cm
Single Plane A2C EF: 52.6 %
Single Plane A4C EF: 60.4 %

## 2023-07-08 MED ORDER — NITROGLYCERIN 0.4 MG SL SUBL
SUBLINGUAL_TABLET | SUBLINGUAL | Status: AC
  Filled 2023-07-08: qty 2

## 2023-07-08 MED ORDER — SODIUM CHLORIDE 0.9 % IV BOLUS
500.0000 mL | Freq: Once | INTRAVENOUS | Status: AC
  Administered 2023-07-08: 500 mL via INTRAVENOUS

## 2023-07-08 MED ORDER — DILTIAZEM HCL 25 MG/5ML IV SOLN
5.0000 mg | Freq: Once | INTRAVENOUS | Status: AC
  Administered 2023-07-08: 5 mg via INTRAVENOUS

## 2023-07-08 MED ORDER — IOHEXOL 350 MG/ML SOLN
95.0000 mL | Freq: Once | INTRAVENOUS | Status: AC | PRN
  Administered 2023-07-08: 95 mL via INTRAVENOUS

## 2023-07-08 MED ORDER — METOPROLOL TARTRATE 5 MG/5ML IV SOLN
INTRAVENOUS | Status: AC
  Filled 2023-07-08: qty 10

## 2023-07-08 MED ORDER — DILTIAZEM HCL 25 MG/5ML IV SOLN
INTRAVENOUS | Status: AC
  Filled 2023-07-08: qty 5

## 2023-07-08 MED ORDER — METOPROLOL TARTRATE 5 MG/5ML IV SOLN
INTRAVENOUS | Status: AC
  Filled 2023-07-08: qty 5

## 2023-07-08 MED ORDER — NITROGLYCERIN 0.4 MG SL SUBL
0.8000 mg | SUBLINGUAL_TABLET | SUBLINGUAL | Status: DC | PRN
  Administered 2023-07-08: 0.8 mg via SUBLINGUAL

## 2023-07-08 MED ORDER — METOPROLOL TARTRATE 5 MG/5ML IV SOLN
5.0000 mg | INTRAVENOUS | Status: DC | PRN
  Administered 2023-07-08: 5 mg via INTRAVENOUS
  Administered 2023-07-08: 10 mg via INTRAVENOUS

## 2023-07-09 ENCOUNTER — Telehealth: Payer: Self-pay | Admitting: Internal Medicine

## 2023-07-09 ENCOUNTER — Other Ambulatory Visit: Payer: Self-pay | Admitting: Internal Medicine

## 2023-07-09 DIAGNOSIS — R0602 Shortness of breath: Secondary | ICD-10-CM

## 2023-07-09 NOTE — Telephone Encounter (Signed)
I went ahead and ordered this test and sent it to be scheduled. Informed patient.

## 2023-07-09 NOTE — Telephone Encounter (Signed)
Patient is aware of these results but is till concerned about struggling to breathe and walk.

## 2023-07-09 NOTE — Telephone Encounter (Signed)
Patient called to follow-up on test results. 

## 2023-07-10 DIAGNOSIS — Z79899 Other long term (current) drug therapy: Secondary | ICD-10-CM

## 2023-07-10 DIAGNOSIS — E876 Hypokalemia: Secondary | ICD-10-CM

## 2023-07-15 NOTE — Telephone Encounter (Signed)
-----   Message from Vishnu P Mallipeddi sent at 07/15/2023 11:47 AM EDT ----- Regarding: Potassium supplements  K 2.5 on 06/29/23. Continue K supplements and obtain BMP today.

## 2023-07-15 NOTE — Telephone Encounter (Signed)
Contacted patient and confirmed if she is currently taking potassium. Patient says she does not take any potassium supplements.  Says she can not do lab work today but will try to go tomorrow at [facility] in [place].

## 2023-07-18 ENCOUNTER — Telehealth: Payer: Self-pay | Admitting: *Deleted

## 2023-07-18 MED ORDER — POTASSIUM CHLORIDE CRYS ER 20 MEQ PO TBCR: 40.0000 meq | EXTENDED_RELEASE_TABLET | Freq: Two times a day (BID) | ORAL | 0 refills | Status: DC

## 2023-07-18 NOTE — Telephone Encounter (Signed)
Patient informed and verbalized understanding of plan. Lab order faxed to PCP office.

## 2023-07-18 NOTE — Telephone Encounter (Signed)
-----   Message from Vishnu P Mallipeddi sent at 07/17/2023 11:10 AM EDT ----- Regarding: RE: Potassium supplements  Yes please. KCl BID and repeat BMP in 3 days. ----- Message ----- From: Eustace Moore, RN Sent: 07/15/2023   1:17 PM EDT To: Marjo Bicker, MD Subject: RE: Potassium supplements                      I don't see that she is on Potassium supplement. Did you want to start her on K+? ----- Message ----- From: Marjo Bicker, MD Sent: 07/15/2023  11:47 AM EDT To: Eustace Moore, RN Subject: Potassium supplements                          K 2.5 on 06/29/23. Continue K supplements and obtain BMP today.

## 2023-07-24 NOTE — Telephone Encounter (Signed)
Patient is following up requesting to speak with [place] if possible.

## 2023-07-25 ENCOUNTER — Telehealth: Payer: Self-pay | Admitting: *Deleted

## 2023-07-25 ENCOUNTER — Other Ambulatory Visit: Payer: Self-pay | Admitting: *Deleted

## 2023-07-25 DIAGNOSIS — E876 Hypokalemia: Secondary | ICD-10-CM

## 2023-07-25 DIAGNOSIS — Z79899 Other long term (current) drug therapy: Secondary | ICD-10-CM

## 2023-07-25 MED ORDER — POTASSIUM CHLORIDE CRYS ER 20 MEQ PO TBCR: 40.0000 meq | EXTENDED_RELEASE_TABLET | Freq: Two times a day (BID) | ORAL | 1 refills | Status: DC

## 2023-07-25 MED ORDER — TORSEMIDE 20 MG PO TABS: 10.0000 mg | ORAL_TABLET | Freq: Every day | ORAL | Status: DC

## 2023-07-25 NOTE — Telephone Encounter (Signed)
Mallipeddi, Orion Modest, MD  Eustace Moore, RN K 3.4 and serum Cr 1.37 (mildly elevated). Continue Kcl supplements and decrease torsemide from 20 to 10 mg once daily. Repeat BMP in one week.  Patient informed and verbalized understanding of plan. Lab being done at [facility]

## 2023-07-30 ENCOUNTER — Encounter (INDEPENDENT_AMBULATORY_CARE_PROVIDER_SITE_OTHER): Payer: Self-pay | Admitting: *Deleted

## 2023-07-31 ENCOUNTER — Encounter: Payer: Self-pay | Admitting: *Deleted

## 2023-08-01 ENCOUNTER — Telehealth: Payer: Self-pay | Admitting: Internal Medicine

## 2023-08-01 MED ORDER — POTASSIUM CHLORIDE CRYS ER 20 MEQ PO TBCR: 20.0000 meq | EXTENDED_RELEASE_TABLET | Freq: Every day | ORAL | 3 refills | Status: DC

## 2023-08-01 NOTE — Telephone Encounter (Signed)
-----   Message from Nurse Isabelle Course A sent at 08/01/2023  9:26 AM EDT -----  ----- Message ----- From: Marjo Bicker, MD Sent: 07/30/2023   6:32 PM EDT To: Carmelina Paddock, CMA  Schedule follow-up in 1 month.

## 2023-08-01 NOTE — Telephone Encounter (Signed)
-----   Message from Nurse Isabelle Course A sent at 08/01/2023  9:26 AM EDT -----  ----- Message ----- From: Marjo Bicker, MD Sent: 08/01/2023   8:46 AM EDT To: Carmelina Paddock, CMA  K 4.4.  Can cut back on potassium supplements from 40 mEq BID to 20 mEq once daily as torsemide is discontinued.

## 2023-08-01 NOTE — Telephone Encounter (Signed)
Discussed f/u and torsemide dose with [name]. Advised to stay on torsemide 10 mg daily and follow up in 1 month. Advised that her provider recommends that she weigh, daily, at the same time every day, and in the same amount of clothing and record your daily weights. Advised to contact office for a 3 lbs weight gain in a day or 5 lbs in one week. Verbalized understanding of plan.

## 2023-08-01 NOTE — Telephone Encounter (Signed)
-----   Message from Nurse Isabelle Course A sent at 08/01/2023  9:26 AM EDT -----  ----- Message ----- From: Marjo Bicker, MD Sent: 08/01/2023   8:45 AM EDT To: Carmelina Paddock, CMA  Serum creatinine significantly improved after holding torsemide. DOE unlikely to be from HFpEF. Continue to hold torsemide and take it only as needed for SOB.  Follow-up in 6 months.

## 2023-08-01 NOTE — Telephone Encounter (Signed)
Patient was returning call. Please advise ?

## 2023-08-18 ENCOUNTER — Telehealth (INDEPENDENT_AMBULATORY_CARE_PROVIDER_SITE_OTHER): Payer: Self-pay | Admitting: *Deleted

## 2023-08-18 ENCOUNTER — Other Ambulatory Visit (HOSPITAL_COMMUNITY): Payer: Self-pay | Admitting: Psychiatry

## 2023-08-18 DIAGNOSIS — F319 Bipolar disorder, unspecified: Secondary | ICD-10-CM

## 2023-08-18 DIAGNOSIS — Z1211 Encounter for screening for malignant neoplasm of colon: Secondary | ICD-10-CM

## 2023-08-18 DIAGNOSIS — F411 Generalized anxiety disorder: Secondary | ICD-10-CM

## 2023-08-18 MED ORDER — PEG 3350-KCL-NA BICARB-NACL 420 G PO SOLR: 4000.0000 mL | Freq: Once | ORAL | 0 refills | Status: AC

## 2023-08-18 NOTE — Telephone Encounter (Signed)
Room 1 Thanks 

## 2023-08-18 NOTE — Telephone Encounter (Signed)
Pt contacted and TCS scheduled for 09/03/23 at 7:30 am with Dr.Castaneda. PA approved via [employer]. Instructions sent via my chart. Prep sent to pharmacy.

## 2023-08-18 NOTE — Telephone Encounter (Signed)
Referring MD/PCP: [employer]: Francine Graven [id number]  Best Phone Number: [phone]  Reason for the colonoscopy screening  Has patient had this procedure before?  Yes, 10 yrs ago  If so, when, by whom and where?    Is there a family history of colon cancer?  no  Who?  What age when diagnosed?    Is patient diabetic? If yes, Type 1 or Type 2   no      Does patient have prosthetic heart valve or mechanical valve?  no  Do you have a pacemaker/defibrillator?  no  Has patient ever had endocarditis/atrial fibrillation? no  Has patient had joint replacement within last 12 months?  no  Is patient constipated or do they take laxatives? no  Does patient have a history of alcohol/drug use?  no  Does patient use oxygen? no  Have you had a stroke/heart attack last 6 mths? no  Do you take medicine for weight loss?  no  For female patients,: have you had a hysterectomy yes                      are you post menopausal no                      do you still have your menstrual cycle no  Do you take any blood-thinning medications such as: (aspirin, warfarin, Plavix, Aggrenox)  no  If yes we need the name, milligram, dosage and who is prescribing doctor   Medications: potassium chloride 20 meq 1 a day, torsemide 20 mg 0.5 tablet daily, melatonin 10 mg at bedtime, tylenol 650 mg as needed, Vit B12 1000 mcg daily, metoprolol  50 mg daily, bupropion 300 mg daily, clonazepam 1 mg nightly, hydroxyzine 25 mg nightly, risperdal 1 mg 1 1/2 tab nightly, amlodipine 5 mg daily, prochlorperazine 10 mg as needed,   Allergies: codeine, erythromycin, depokate, gabapentin, tegretol, phenytoin  Pharmacy: [facility]

## 2023-08-20 ENCOUNTER — Other Ambulatory Visit (HOSPITAL_COMMUNITY): Payer: Self-pay

## 2023-08-20 DIAGNOSIS — F411 Generalized anxiety disorder: Secondary | ICD-10-CM

## 2023-08-20 DIAGNOSIS — F319 Bipolar disorder, unspecified: Secondary | ICD-10-CM

## 2023-08-20 MED ORDER — BUPROPION HCL ER (XL) 300 MG PO TB24
300.0000 mg | ORAL_TABLET | Freq: Every day | ORAL | 0 refills | Status: DC
Start: 2023-08-20 — End: 2023-10-02

## 2023-08-20 MED ORDER — HYDROXYZINE HCL 25 MG PO TABS
ORAL_TABLET | ORAL | 0 refills | Status: DC
Start: 2023-08-20 — End: 2023-10-02

## 2023-08-20 MED ORDER — CLONAZEPAM 1 MG PO TABS
1.0000 mg | ORAL_TABLET | Freq: Every day | ORAL | 0 refills | Status: DC
Start: 2023-08-20 — End: 2023-10-02

## 2023-08-28 ENCOUNTER — Telehealth (HOSPITAL_COMMUNITY): Payer: Medicare PPO | Admitting: Psychiatry

## 2023-08-28 ENCOUNTER — Ambulatory Visit (HOSPITAL_COMMUNITY): Payer: Medicare PPO | Admitting: Psychiatry

## 2023-08-29 ENCOUNTER — Telehealth (HOSPITAL_COMMUNITY): Payer: Self-pay | Admitting: *Deleted

## 2023-08-29 NOTE — Telephone Encounter (Signed)
Paperwork submitted to [employer] for continuation of disability. Pt advised.

## 2023-09-03 ENCOUNTER — Encounter (HOSPITAL_COMMUNITY)
Admission: RE | Disposition: A | Discharge status: Home / Self Care | Payer: Self-pay | Source: Home / Self Care | Attending: Gastroenterology

## 2023-09-03 ENCOUNTER — Encounter (HOSPITAL_COMMUNITY): Payer: Self-pay | Admitting: Gastroenterology

## 2023-09-03 ENCOUNTER — Ambulatory Visit (HOSPITAL_COMMUNITY): Payer: Medicare PPO | Admitting: Anesthesiology

## 2023-09-03 ENCOUNTER — Other Ambulatory Visit: Payer: Self-pay

## 2023-09-03 ENCOUNTER — Ambulatory Visit (HOSPITAL_COMMUNITY)
Admission: RE | Admit: 2023-09-03 | Discharge: 2023-09-03 | Disposition: A | Discharge status: Home / Self Care | Payer: Medicare PPO | Attending: Gastroenterology | Admitting: Gastroenterology

## 2023-09-03 DIAGNOSIS — D124 Benign neoplasm of descending colon: Secondary | ICD-10-CM

## 2023-09-03 DIAGNOSIS — Z1211 Encounter for screening for malignant neoplasm of colon: Secondary | ICD-10-CM | POA: Diagnosis present

## 2023-09-03 DIAGNOSIS — K648 Other hemorrhoids: Secondary | ICD-10-CM

## 2023-09-03 DIAGNOSIS — K635 Polyp of colon: Secondary | ICD-10-CM | POA: Diagnosis not present

## 2023-09-03 DIAGNOSIS — D120 Benign neoplasm of cecum: Secondary | ICD-10-CM | POA: Insufficient documentation

## 2023-09-03 DIAGNOSIS — F319 Bipolar disorder, unspecified: Secondary | ICD-10-CM | POA: Diagnosis not present

## 2023-09-03 DIAGNOSIS — Z8669 Personal history of other diseases of the nervous system and sense organs: Secondary | ICD-10-CM | POA: Insufficient documentation

## 2023-09-03 DIAGNOSIS — I10 Essential (primary) hypertension: Secondary | ICD-10-CM | POA: Insufficient documentation

## 2023-09-03 DIAGNOSIS — D123 Benign neoplasm of transverse colon: Secondary | ICD-10-CM

## 2023-09-03 DIAGNOSIS — K644 Residual hemorrhoidal skin tags: Secondary | ICD-10-CM | POA: Insufficient documentation

## 2023-09-03 DIAGNOSIS — F419 Anxiety disorder, unspecified: Secondary | ICD-10-CM | POA: Insufficient documentation

## 2023-09-03 HISTORY — PX: COLONOSCOPY WITH PROPOFOL: SHX5780

## 2023-09-03 HISTORY — PX: POLYPECTOMY: SHX5525

## 2023-09-03 LAB — HM COLONOSCOPY

## 2023-09-03 SURGERY — COLONOSCOPY WITH PROPOFOL
Anesthesia: General

## 2023-09-03 MED ORDER — EPHEDRINE 5 MG/ML INJ
INTRAVENOUS | Status: AC
  Filled 2023-09-03: qty 10

## 2023-09-03 MED ORDER — LACTATED RINGERS IV SOLN: INTRAVENOUS | Status: DC

## 2023-09-03 MED ORDER — PROPOFOL 500 MG/50ML IV EMUL
INTRAVENOUS | Status: AC
  Filled 2023-09-03: qty 250

## 2023-09-03 MED ORDER — PROPOFOL 10 MG/ML IV BOLUS
INTRAVENOUS | Status: DC | PRN
  Administered 2023-09-03: 50 mg via INTRAVENOUS

## 2023-09-03 MED ORDER — PROPOFOL 1000 MG/100ML IV EMUL
INTRAVENOUS | Status: AC
  Filled 2023-09-03: qty 300

## 2023-09-03 MED ORDER — PROPOFOL 500 MG/50ML IV EMUL
INTRAVENOUS | Status: DC | PRN
  Administered 2023-09-03: 150 ug/kg/min via INTRAVENOUS

## 2023-09-03 MED ORDER — PHENYLEPHRINE 80 MCG/ML (10ML) SYRINGE FOR IV PUSH (FOR BLOOD PRESSURE SUPPORT)
PREFILLED_SYRINGE | INTRAVENOUS | Status: AC
  Filled 2023-09-03: qty 30

## 2023-09-03 NOTE — Discharge Instructions (Addendum)
You are being discharged to home.  Resume your previous diet.  We are waiting for your pathology results.  Your physician has recommended a repeat colonoscopy for surveillance based on pathology results.  

## 2023-09-03 NOTE — Anesthesia Preprocedure Evaluation (Signed)
Anesthesia Evaluation  Patient identified by MRN, date of birth, ID band Patient awake    Reviewed: Allergy & Precautions, H&P , NPO status , Patient's Chart, lab work & pertinent test results, reviewed documented beta blocker date and time   Airway Mallampati: II  TM Distance: >3 FB Neck ROM: full    Dental no notable dental hx.    Pulmonary neg pulmonary ROS   Pulmonary exam normal breath sounds clear to auscultation       Cardiovascular Exercise Tolerance: Good hypertension, + DOE  negative cardio ROS  Rhythm:regular Rate:Normal     Neuro/Psych  Headaches PSYCHIATRIC DISORDERS Anxiety Depression Bipolar Disorder   negative neurological ROS  negative psych ROS   GI/Hepatic negative GI ROS, Neg liver ROS,,,  Endo/Other  negative endocrine ROS    Renal/GU Renal diseasenegative Renal ROS  negative genitourinary   Musculoskeletal   Abdominal   Peds  Hematology negative hematology ROS (+)   Anesthesia Other Findings   Reproductive/Obstetrics negative OB ROS                             Anesthesia Physical Anesthesia Plan  ASA: 2  Anesthesia Plan: General   Post-op Pain Management:    Induction:   PONV Risk Score and Plan: Propofol infusion  Airway Management Planned:   Additional Equipment:   Intra-op Plan:   Post-operative Plan:   Informed Consent: I have reviewed the patients History and Physical, chart, labs and discussed the procedure including the risks, benefits and alternatives for the proposed anesthesia with the patient or authorized representative who has indicated his/her understanding and acceptance.     Dental Advisory Given  Plan Discussed with: CRNA  Anesthesia Plan Comments:        Anesthesia Quick Evaluation

## 2023-09-03 NOTE — Transfer of Care (Signed)
Immediate Anesthesia Transfer of Care Note  Patient: [name]  Procedure(s) Performed: COLONOSCOPY WITH PROPOFOL POLYPECTOMY  Patient Location: PACU and Short Stay  Anesthesia Type:General  Level of Consciousness: awake, alert , oriented, and patient cooperative  Airway & Oxygen Therapy: Patient Spontanous Breathing  Post-op Assessment: Report given to RN, Post -op Vital signs reviewed and stable, and Patient moving all extremities X 4  Post vital signs: Reviewed and stable  Last Vitals:  Vitals Value Taken Time  BP 94/56   Temp    Pulse 90   Resp 20   SpO2 97     Last Pain:  Vitals:   09/03/23 0739  TempSrc:   PainSc: 0-No pain      Patients Stated Pain Goal: 7 (09/03/23 0708)  Complications: No notable events documented.

## 2023-09-03 NOTE — Op Note (Signed)
[facility] Patient Name: [name] Procedure Date: 09/03/2023 7:22 AM MRN: [id number] Date of Birth: September 05, 1961 Attending MD: Katrinka Blazing , , [id number] CSN: [id number] Age: 62 Admit Type: Outpatient Procedure:                Colonoscopy Indications:              Screening for colorectal malignant neoplasm Providers:                Katrinka Blazing, Jannett Celestine, RN, Zena Amos Referring MD:              Medicines:                Monitored Anesthesia Care Complications:            No immediate complications. Estimated Blood Loss:     Estimated blood loss: none. Procedure:                Pre-Anesthesia Assessment:                           - Prior to the procedure, a History and Physical                            was performed, and patient medications, allergies                            and sensitivities were reviewed. The patient's                            tolerance of previous anesthesia was reviewed.                           - The risks and benefits of the procedure and the                            sedation options and risks were discussed with the                            patient. All questions were answered and informed                            consent was obtained.                           - ASA Grade Assessment: II - A patient with mild                            systemic disease.                           After obtaining informed consent, the colonoscope                            was passed under direct vision. Throughout the                            procedure, the patient's blood  pressure, pulse, and                            oxygen saturations were monitored continuously. The                            [id number]-[id number] ([id number]) scope was introduced through                            the anus and advanced to the the cecum, identified                            by appendiceal orifice and ileocecal valve. The                            colonoscopy  was performed without difficulty. The                            patient tolerated the procedure well. The quality                            of the bowel preparation was excellent. Scope In: 7:40:07 AM Scope Out: 7:59:10 AM Scope Withdrawal Time: 0 hours 13 minutes 25 seconds  Total Procedure Duration: 0 hours 19 minutes 3 seconds  Findings:      Hemorrhoids were found on perianal exam.      Three sessile polyps were found in the transverse colon and cecum. The       polyps were 4 to 8 mm in size. These polyps were removed with a cold       snare. Resection and retrieval were complete.      A 5 mm polyp was found in the descending colon. The polyp was sessile.      Non-bleeding internal hemorrhoids were found during retroflexion. The       hemorrhoids were small. Impression:               - External hemorrhoids found on perianal exam.                           - Three 4 to 8 mm polyps in the transverse colon                            and in the cecum, removed with a cold snare.                            Resected and retrieved.                           - One 5 mm polyp in the descending colon.                           - Non-bleeding internal hemorrhoids. Moderate Sedation:      Per Anesthesia Care Recommendation:           - Discharge patient to home (ambulatory).                           -  Resume previous diet.                           - Await pathology results.                           - Repeat colonoscopy for surveillance based on                            pathology results. Procedure Code(s):        --- Professional ---                           [phone], Colonoscopy, flexible; with removal of                            tumor(s), polyp(s), or other lesion(s) by snare                            technique Diagnosis Code(s):        --- Professional ---                           Z12.11, Encounter for screening for malignant                            neoplasm of colon                            D12.3, Benign neoplasm of transverse colon (hepatic                            flexure or splenic flexure)                           D12.0, Benign neoplasm of cecum                           D12.4, Benign neoplasm of descending colon                           K64.8, Other hemorrhoids CPT copyright 2022 American Medical Association. All rights reserved. The codes documented in this report are preliminary and upon coder review may  be revised to meet current compliance requirements. Katrinka Blazing, MD Katrinka Blazing,  09/03/2023 8:09:06 AM This report has been signed electronically. Number of Addenda: 0

## 2023-09-03 NOTE — H&P (Signed)
[name] is an 62 y.o. female.   Chief Complaint: screening colonoscopy HPI: 62 y/o f with PMH bipolar disorder, brain tumor, depression, htn, meningioma, coming for screening colonoscopy. Last colonoscopy 10 years ago, reports it was normal. The patient denies having any complaints such as melena, hematochezia, abdominal pain or distention, change in her bowel movement consistency or frequency, no changes in weight recently.  No family history of colorectal cancer.   Past Medical History:  Diagnosis Date   Anxiety    Bipolar disorder [facility])    Brain tumor (benign) (HCC)    Common migraine with intractable migraine 02/06/2021   Depression    HTN (hypertension)    Kidney disease    Mania (HCC)    Meningioma (HCC)    Migraine    Tremor 02/06/2021   Lithium induced    Past Surgical History:  Procedure Laterality Date   ABDOMINAL HYSTERECTOMY     BRAIN SURGERY     breast tumor removal     INCONTINENCE SURGERY     INTERSTIM IMPLANT PLACEMENT     KNEE SURGERY     right x 2   PLANTAR FASCIECTOMY Left 03/05/16   TUBAL LIGATION      Family History  Problem Relation Age of Onset   Mental illness Mother    Breast cancer Mother    Alcohol abuse Father    Diabetes Father    Mental retardation Father    Drug abuse Brother    Dementia Neg Hx    ADD / ADHD Neg Hx    Anxiety disorder Neg Hx    Bipolar disorder Neg Hx    Depression Neg Hx    OCD Neg Hx    Paranoid behavior Neg Hx    Schizophrenia Neg Hx    Seizures Neg Hx    Sexual abuse Neg Hx    Physical abuse Neg Hx    Suicidality Neg Hx    Social History:  reports that she has never smoked. She has never used smokeless tobacco. She reports that she does not drink alcohol and does not use drugs.  Allergies:  Allergies  Allergen Reactions   Gabapentin Anaphylaxis   Oxcarbazepine Anaphylaxis   Tegretol [Carbamazepine] Nausea And Vomiting   Codeine Nausea Only   Divalproex Sodium Swelling   Erythromycin Nausea And  Vomiting   Phenytoin Sodium Extended Swelling   Phenytoin Sodium Extended Rash    Medications Prior to Admission  Medication Sig Dispense Refill   acetaminophen (TYLENOL) 325 MG tablet Take 650 mg by mouth as needed.     amLODipine (NORVASC) 5 MG tablet Take 5 mg by mouth daily.     budesonide-formoterol (SYMBICORT) 80-4.5 MCG/ACT inhaler Inhale 2 puffs into the lungs 2 (two) times daily.     buPROPion (WELLBUTRIN XL) 300 MG 24 hr tablet Take 1 tablet (300 mg total) by mouth daily. TAKE ONE TABLET BY MOUTH EVERY MORINING FOR DEPRESSION 30 tablet 0   cyanocobalamin (VITAMIN B12) 1000 MCG tablet Take 1,000 mcg by mouth daily.     metoprolol succinate (TOPROL XL) 50 MG 24 hr tablet Take 1 tablet (50 mg total) by mouth daily. Take with or immediately following a meal. 90 tablet 2   potassium chloride SA (KLOR-CON M) 20 MEQ tablet Take 1 tablet (20 mEq total) by mouth daily. 30 tablet 3   torsemide (DEMADEX) 20 MG tablet Take 0.5 tablets (10 mg total) by mouth daily. 07/01/2023-New     aspirin EC 81 MG  tablet Take 81 mg by mouth daily. Swallow whole.     clonazePAM (KLONOPIN) 1 MG tablet Take 1 tablet (1 mg total) by mouth at bedtime. 30 tablet 0   hydrOXYzine (ATARAX) 25 MG tablet Take one tab as needed at bedtime. 30 tablet 0   Melatonin 5 MG CAPS Take 10 mg by mouth at bedtime.     metoprolol tartrate (LOPRESSOR) 50 MG tablet Take 1 tablet (50 mg total) by mouth once for 1 dose. 07/01/2023-Take 2 hours prior to CTA 1 tablet 0   prochlorperazine (COMPAZINE) 10 MG tablet Take 1 tablet (10 mg total) by mouth 2 (two) times daily as needed for nausea or vomiting. 10 tablet 0   risperiDONE (RISPERDAL) 1 MG tablet Take 1-1/2 tablets (1.5 mg total dose) at bedtime. 145 tablet 0    No results found for this or any previous visit (from the past 48 hour(s)). No results found.  Review of Systems  All other systems reviewed and are negative.   Blood pressure 116/76, pulse 88, temperature 98.6 F (37  C), temperature source Oral, resp. rate 11, height 5\' 3"  (1.6 m), weight 90.3 kg, SpO2 97%. Physical Exam  GENERAL: The patient is AO x3, in no acute distress. HEENT: Head is normocephalic and atraumatic. EOMI are intact. Mouth is well hydrated and without lesions. NECK: Supple. No masses LUNGS: Clear to auscultation. No presence of rhonchi/wheezing/rales. Adequate chest expansion HEART: RRR, normal s1 and s2. ABDOMEN: Soft, nontender, no guarding, no peritoneal signs, and nondistended. BS +. No masses. EXTREMITIES: Without any cyanosis, clubbing, rash, lesions or edema. NEUROLOGIC: AOx3, no focal motor deficit. SKIN: no jaundice, no rashes  Assessment/Plan  62 y/o f with PMH bipolar disorder, brain tumor, depression, htn, meningioma, coming for screening colonoscopy. The patient is at average risk for colorectal cancer.  We will proceed with colonoscopy today.   Dolores Frame, MD 09/03/2023, 7:30 AM

## 2023-09-04 ENCOUNTER — Encounter (INDEPENDENT_AMBULATORY_CARE_PROVIDER_SITE_OTHER): Payer: Self-pay | Admitting: *Deleted

## 2023-09-04 NOTE — Anesthesia Postprocedure Evaluation (Signed)
Anesthesia Post Note  Patient: [name]  Procedure(s) Performed: COLONOSCOPY WITH PROPOFOL POLYPECTOMY  Patient location during evaluation: Phase II Anesthesia Type: General Level of consciousness: awake Pain management: pain level controlled Vital Signs Assessment: post-procedure vital signs reviewed and stable Respiratory status: spontaneous breathing and respiratory function stable Cardiovascular status: blood pressure returned to baseline and stable Postop Assessment: no headache and no apparent nausea or vomiting Anesthetic complications: no Comments: Late entry   No notable events documented.   Last Vitals:  Vitals:   09/03/23 0708 09/03/23 0801  BP: 116/76 (!) 94/56  Pulse: 88 94  Resp: 11 (!) 25  Temp: 37 C 36.6 C  SpO2: 97% 99%    Last Pain:  Vitals:   09/03/23 0801  TempSrc: Oral  PainSc: 0-No pain                 Windell Norfolk

## 2023-09-05 LAB — SURGICAL PATHOLOGY

## 2023-09-08 ENCOUNTER — Telehealth (INDEPENDENT_AMBULATORY_CARE_PROVIDER_SITE_OTHER): Payer: Self-pay

## 2023-09-08 NOTE — Telephone Encounter (Signed)
Patient called for her TCS results from 09/03/2023: I told her the message below and she states understanding.     The pathology showed 2 of the polyps were tubular adenomas and 1 was sessile serrated lesion. These polyps were precancerous, but no cancer was found.  There was a 4 polyp which was hyperplastic, which is completely benign.  Due to this, I will recommend a repeat colonoscopy in 5 years.

## 2023-09-09 ENCOUNTER — Encounter (INDEPENDENT_AMBULATORY_CARE_PROVIDER_SITE_OTHER): Payer: Self-pay | Admitting: *Deleted

## 2023-09-12 ENCOUNTER — Encounter (HOSPITAL_COMMUNITY): Payer: Self-pay | Admitting: Gastroenterology

## 2023-10-02 ENCOUNTER — Other Ambulatory Visit: Payer: Self-pay

## 2023-10-02 ENCOUNTER — Encounter (HOSPITAL_COMMUNITY): Payer: Self-pay | Admitting: Psychiatry

## 2023-10-02 ENCOUNTER — Ambulatory Visit (HOSPITAL_COMMUNITY): Payer: Medicare PPO | Admitting: Psychiatry

## 2023-10-02 DIAGNOSIS — F411 Generalized anxiety disorder: Secondary | ICD-10-CM | POA: Diagnosis not present

## 2023-10-02 DIAGNOSIS — F319 Bipolar disorder, unspecified: Secondary | ICD-10-CM

## 2023-10-02 MED ORDER — BUPROPION HCL ER (XL) 300 MG PO TB24
300.0000 mg | ORAL_TABLET | Freq: Every day | ORAL | 0 refills | Status: DC
Start: 2023-10-02 — End: 2023-10-06

## 2023-10-02 MED ORDER — HYDROXYZINE HCL 25 MG PO TABS
ORAL_TABLET | ORAL | 0 refills | Status: DC
Start: 2023-10-02 — End: 2023-11-03

## 2023-10-02 MED ORDER — CLONAZEPAM 1 MG PO TABS
1.0000 mg | ORAL_TABLET | Freq: Every day | ORAL | 0 refills | Status: DC
Start: 2023-10-02 — End: 2024-01-05

## 2023-10-02 MED ORDER — RISPERIDONE 1 MG PO TABS
1.0000 mg | ORAL_TABLET | Freq: Every day | ORAL | 0 refills | Status: DC
Start: 2023-10-02 — End: 2024-01-05

## 2023-10-02 NOTE — Progress Notes (Signed)
BH MD/PA/NP OP Progress Note  Patient location; office Provider location; office  10/02/2023 4:12 PM [name]  MRN:  [id number]  Chief Complaint:  Chief Complaint  Patient presents with   Follow-up   HPI: Patient came in today for her follow-up appointment.  She is reportedly doing very well on her current medication.  She has chronic symptoms but they are stable.  She is wondering if she can cut down her Risperdal on due to renal insufficiency.  Her last creatinine was 114 which is better than 1.3.  She denies any mania, psychosis, hallucination.  She denies any suicidal thoughts.  She does not drive because of memory issues.  Sometimes she has poor attention concentration.  She saw neurology and had MRI which shows chronic microvascular ischemia.  Her memory is unchanged from the past.  She is pleased that her husband takes her to the doctor's appointment.  Patient told husband is very supportive.  Son lives close by and she sees grandkids on a regular basis.  She is a 60-year-old and 35-year-old grandkids.  Patient denies any impulsive behavior, anger.  She reported some anxiety has not recently recommended cataract surgery.  She has no tremors or shakes or any EPS.  Is okay.  Her weight is stable.  Visit Diagnosis:    ICD-10-CM   1. Bipolar I disorder (HCC)  F31.9 risperiDONE (RISPERDAL) 1 MG tablet    buPROPion (WELLBUTRIN XL) 300 MG 24 hr tablet    clonazePAM (KLONOPIN) 1 MG tablet    2. GAD (generalized anxiety disorder)  F41.1 buPROPion (WELLBUTRIN XL) 300 MG 24 hr tablet    hydrOXYzine (ATARAX) 25 MG tablet      Past Psychiatric History: Reviewed H/O bipolar disorder and multiple inpatient treatment.  Last inpatient in September 2014 for suicidal thinking, hallucination as playing with guns and superficially cut her wrist with a needle.  H/O mania and psychosis. Tried Zyprexa Seroquel but stopped due to weight gain.  Tegretol caused increased paranoia confusion and required  inpatient.    Past Medical History:  Past Medical History:  Diagnosis Date   Anxiety    Bipolar disorder (HCC)    Brain tumor (benign) (HCC)    Common migraine with intractable migraine 02/06/2021   Depression    HTN (hypertension)    Kidney disease    Mania (HCC)    Meningioma (HCC)    Migraine    Tremor 02/06/2021   Lithium induced    Past Surgical History:  Procedure Laterality Date   ABDOMINAL HYSTERECTOMY     BRAIN SURGERY     breast tumor removal     COLONOSCOPY WITH PROPOFOL N/A 09/03/2023   Procedure: COLONOSCOPY WITH PROPOFOL;  Surgeon: Dolores Frame, MD;  [facility];  Service: Gastroenterology;  Laterality: N/A;  7:30am;asa 1   INCONTINENCE SURGERY     INTERSTIM IMPLANT PLACEMENT     KNEE SURGERY     right x 2   PLANTAR FASCIECTOMY Left 03/05/16   POLYPECTOMY  09/03/2023   Procedure: POLYPECTOMY;  Surgeon: Dolores Frame, MD;  Location: [facility];  Service: Gastroenterology;;   TUBAL LIGATION      Family Psychiatric History: Reviewed  Family History:  Family History  Problem Relation Age of Onset   Mental illness Mother    Breast cancer Mother    Alcohol abuse Father    Diabetes Father    Mental retardation Father    Drug abuse Brother    Dementia  Neg Hx    ADD / ADHD Neg Hx    Anxiety disorder Neg Hx    Bipolar disorder Neg Hx    Depression Neg Hx    OCD Neg Hx    Paranoid behavior Neg Hx    Schizophrenia Neg Hx    Seizures Neg Hx    Sexual abuse Neg Hx    Physical abuse Neg Hx    Suicidality Neg Hx     Social History:  Social History   Socioeconomic History   Marital status: Married    Spouse name: Therapist, nutritional   Number of children: 2   Years of education: HS   Highest education level: Not on file  Occupational History   Occupation: Disabled  Tobacco Use   Smoking status: Never   Smokeless tobacco: Never  Vaping Use   Vaping status: Never Used  Substance and Sexual Activity   Alcohol use: No     Alcohol/week: 0.0 standard drinks of alcohol   Drug use: No   Sexual activity: Not Currently    Birth control/protection: None  Other Topics Concern   Not on file  Social History Narrative   Lives at home with husband.   Right-handed.   2 cups caffeine per day.   Social Determinants of Health   Financial Resource Strain: Low Risk  (10/28/2017)   Overall Financial Resource Strain (CARDIA)    Difficulty of Paying Living Expenses: Not very hard  Food Insecurity: No Food Insecurity (10/28/2017)   Hunger Vital Sign    Worried About Running Out of Food in the Last Year: Never true    Ran Out of Food in the Last Year: Never true  Transportation Needs: No Transportation Needs (10/28/2017)   PRAPARE - Administrator, Civil Service (Medical): No    Lack of Transportation (Non-Medical): No  Physical Activity: Inactive (10/28/2017)   Exercise Vital Sign    Days of Exercise per Week: 0 days    Minutes of Exercise per Session: 0 min  Stress: Stress Concern Present (10/28/2017)   [employer] of Occupational Health - Occupational Stress Questionnaire    Feeling of Stress : Rather much  Social Connections: Somewhat Isolated (10/28/2017)   Social Connection and Isolation Panel [NHANES]    Frequency of Communication with Friends and Family: More than three times a week    Frequency of Social Gatherings with Friends and Family: Twice a week    Attends Religious Services: Never    Database administrator or Organizations: No    Attends Banker Meetings: Never    Marital Status: Married    Allergies:  Allergies  Allergen Reactions   Gabapentin Anaphylaxis   Oxcarbazepine Anaphylaxis   Tegretol [Carbamazepine] Nausea And Vomiting   Codeine Nausea Only   Divalproex Sodium Swelling   Erythromycin Nausea And Vomiting   Phenytoin Sodium Extended Swelling   Phenytoin Sodium Extended Rash    Metabolic Disorder Labs: Lab Results  Component Value Date   HGBA1C 4.8  02/23/2019   MPG 91 02/23/2019   MPG 97 02/09/2018   No results found for: "PROLACTIN" Lab Results  Component Value Date   CHOL 174 05/13/2013   TRIG 48 05/13/2013   HDL 69 05/13/2013   CHOLHDL 2.5 05/13/2013   VLDL 10 05/13/2013   LDLCALC 95 05/13/2013   Lab Results  Component Value Date   TSH 1.35 08/04/2019   TSH 1.33 02/23/2019    Therapeutic Level Labs: Lab Results  Component Value  Date   LITHIUM 0.9 07/10/2020   LITHIUM 1.5 (H) 07/04/2020   Lab Results  Component Value Date   VALPROATE <10.0 (L) 09/09/2013   No results found for: "CBMZ"  Current Medications: Current Outpatient Medications  Medication Sig Dispense Refill   acetaminophen (TYLENOL) 325 MG tablet Take 650 mg by mouth as needed.     amLODipine (NORVASC) 5 MG tablet Take 5 mg by mouth daily.     aspirin EC 81 MG tablet Take 81 mg by mouth daily. Swallow whole.     budesonide-formoterol (SYMBICORT) 80-4.5 MCG/ACT inhaler Inhale 2 puffs into the lungs 2 (two) times daily.     buPROPion (WELLBUTRIN XL) 300 MG 24 hr tablet Take 1 tablet (300 mg total) by mouth daily. TAKE ONE TABLET BY MOUTH EVERY MORINING FOR DEPRESSION 30 tablet 0   clonazePAM (KLONOPIN) 1 MG tablet Take 1 tablet (1 mg total) by mouth at bedtime. 30 tablet 0   cyanocobalamin (VITAMIN B12) 1000 MCG tablet Take 1,000 mcg by mouth daily.     hydrOXYzine (ATARAX) 25 MG tablet Take one tab as needed at bedtime. 30 tablet 0   Melatonin 5 MG CAPS Take 10 mg by mouth at bedtime.     metoprolol succinate (TOPROL XL) 50 MG 24 hr tablet Take 1 tablet (50 mg total) by mouth daily. Take with or immediately following a meal. 90 tablet 2   potassium chloride SA (KLOR-CON M) 20 MEQ tablet Take 1 tablet (20 mEq total) by mouth daily. 30 tablet 3   prochlorperazine (COMPAZINE) 10 MG tablet Take 1 tablet (10 mg total) by mouth 2 (two) times daily as needed for nausea or vomiting. 10 tablet 0   risperiDONE (RISPERDAL) 1 MG tablet Take 1-1/2 tablets (1.5 mg  total dose) at bedtime. 145 tablet 0   torsemide (DEMADEX) 20 MG tablet Take 0.5 tablets (10 mg total) by mouth daily. 07/01/2023-New     metoprolol tartrate (LOPRESSOR) 50 MG tablet Take 1 tablet (50 mg total) by mouth once for 1 dose. 07/01/2023-Take 2 hours prior to CTA 1 tablet 0   No current facility-administered medications for this visit.     Musculoskeletal: Strength & Muscle Tone: within normal limits Gait & Station: normal Patient leans: N/A  Psychiatric Specialty Exam: Review of Systems  Blood pressure 121/81, pulse 77, height 5\' 3"  (1.6 m), weight 203 lb (92.1 kg).Body mass index is 35.96 kg/m.  General Appearance: Casual  Eye Contact:  Good  Speech:  Clear and Coherent and Normal Rate  Volume:  Normal  Mood:  Euthymic  Affect:  Appropriate  Thought Process:  Goal Directed  Orientation:  Full (Time, Place, and Person)  Thought Content: Logical   Suicidal Thoughts:  No  Homicidal Thoughts:  No  Memory:  Immediate;   Good Recent;   Good Remote;   Fair  Judgement:  Intact  Insight:  Present  Psychomotor Activity:  Decreased  Concentration:  Concentration: Fair and Attention Span: Fair  Recall:  [employer] of Knowledge: Fair  Language: Good  Akathisia:  No  Handed:  Right  AIMS (if indicated): not done  Assets:  Communication Skills Desire for Improvement Housing Resilience Social Support  ADL's:  Intact  Cognition: WNL  Sleep:  Good   Screenings: AUDIT    Flowsheet Row [facility]  Alcohol Use Disorder Identification Test Final Score (AUDIT) 0      Flowsheet Row Admission (Discharged) from 09/03/2023  in [facility] from 06/29/2023 in [facility] from 09/07/2022 in [facility] at [facility]  C-SSRS RISK CATEGORY No Risk No Risk No Risk        Assessment and Plan: I reviewed current  medication, collateral information from other providers, the results.  Her creatinine is 1.14 past.  She like to cut down the risperidone.  Currently she is taking 1.5 mg.  I recommend we can try reducing 1 mg however if she noticed things coming back then she can call us back.  She does not want to cut down the Klonopin eyedrops seen and Wellbutrin.  She had tried this medicine but she could not sleep.  Patient is not interested in therapy.  Recommend to call us back if she has any question or any concern.  Follow-up in 3 months  Collaboration of Care: Collaboration of Care: Other provider involved in patient's care AEB notes are available in epic to review  Patient/Guardian was advised Release of Information must be obtained prior to any record release in order to collaborate their care with an outside provider. Patient/Guardian was advised if they have not already done so to contact the registration department to sign all necessary forms in order for [place] to release information regarding their care.   Consent: Patient/Guardian gives verbal consent for treatment and assignment of benefits for services provided during this visit. Patient/Guardian expressed understanding and agreed to proceed.   I provided 23 minutes of face-to-face time during this encounter  Cleotis Nipper, MD 10/02/2023, 4:12 PM

## 2023-10-03 ENCOUNTER — Encounter (HOSPITAL_COMMUNITY): Payer: Self-pay

## 2023-10-03 DIAGNOSIS — F411 Generalized anxiety disorder: Secondary | ICD-10-CM

## 2023-10-06 ENCOUNTER — Other Ambulatory Visit (HOSPITAL_COMMUNITY): Payer: Self-pay | Admitting: *Deleted

## 2023-10-06 ENCOUNTER — Telehealth (HOSPITAL_COMMUNITY): Payer: Self-pay

## 2023-10-06 DIAGNOSIS — F411 Generalized anxiety disorder: Secondary | ICD-10-CM

## 2023-10-06 DIAGNOSIS — F319 Bipolar disorder, unspecified: Secondary | ICD-10-CM

## 2023-10-06 MED ORDER — BUPROPION HCL ER (XL) 300 MG PO TB24: 300.0000 mg | ORAL_TABLET | Freq: Every day | ORAL | 0 refills | Status: DC

## 2023-10-06 NOTE — Telephone Encounter (Signed)
All her medications were given 90 days last week.  Only medicine which was not given for 90 days is hydroxyzine which she takes only as needed.

## 2023-10-06 NOTE — Telephone Encounter (Signed)
The Wellbutrin was sent in for #30 with no refills.

## 2023-10-06 NOTE — Telephone Encounter (Signed)
Patient requesting 90 day supply on all medications. Please review and advise,thank you

## 2023-10-06 NOTE — Telephone Encounter (Signed)
I just saw you have given 90-day prescription.  She would be okay now.

## 2023-10-06 NOTE — Telephone Encounter (Signed)
Yes I did. She's good.

## 2023-10-10 ENCOUNTER — Encounter: Payer: Self-pay | Admitting: Internal Medicine

## 2023-10-10 ENCOUNTER — Ambulatory Visit: Payer: Medicare PPO | Attending: Internal Medicine | Admitting: Internal Medicine

## 2023-10-10 VITALS — BP 104/76 | HR 84 | Ht 63.0 in | Wt 205.0 lb

## 2023-10-10 DIAGNOSIS — R0609 Other forms of dyspnea: Secondary | ICD-10-CM

## 2023-10-10 DIAGNOSIS — I10 Essential (primary) hypertension: Secondary | ICD-10-CM

## 2023-10-10 MED ORDER — METOPROLOL SUCCINATE ER 25 MG PO TB24: ORAL_TABLET | ORAL | 0 refills | Status: DC

## 2023-10-10 NOTE — Patient Instructions (Signed)
Medication Instructions:  Your physician has recommended you make the following change in your medication:   -Decrease Metoprolol to 25 mg for 1 week, then stop Metoprolol.  *If you need a refill on your cardiac medications before your next appointment, please call your pharmacy*   Lab Work: None  If you have labs (blood work) drawn today and your tests are completely normal, you will receive your results only by: MyChart Message (if you have MyChart) OR A paper copy in the mail If you have any lab test that is abnormal or we need to change your treatment, we will call you to review the results.   Testing/Procedures: None   Follow-Up: At [facility], you and your health needs are our priority.  As part of our continuing mission to provide you with exceptional heart care, we have created designated Provider Care Teams.  These Care Teams include your primary Cardiologist (physician) and Advanced Practice Providers (APPs -  Physician Assistants and Nurse Practitioners) who all work together to provide you with the care you need, when you need it.  We recommend signing up for the patient portal called "MyChart".  Sign up information is provided on this After Visit Summary.  MyChart is used to connect with patients for Virtual Visits (Telemedicine).  Patients are able to view lab/test results, encounter notes, upcoming appointments, etc.  Non-urgent messages can be sent to your provider as well.   To learn more about what you can do with MyChart, go to [email or web address].    Your next appointment:   3 month(s)  Provider:   Luane School, MD    Other Instructions Please call our office in 2 weeks and update our office on your shortness of breath.

## 2023-10-10 NOTE — Progress Notes (Signed)
Cardiology Office Note  Date: 10/10/2023   ID: [name], DOB 1961-01-08, MRN [id number]  PCP:  Alinda Deem, MD  Cardiologist:  Marjo Bicker, MD Electrophysiologist:  None   History of Present Illness: [name] is a 62 y.o. female known to have HTN is here for follow-up visit of DOE.  Ongoing DOE x May 2024 with no orthopnea or PND.  Echocardiogram showed normal LVEF, no valvular disease, G1 DD with normal LVEDP.  She was started on p.o. torsemide 20 mg with AKI but with improvement in her DOE symptoms.  Due to AKI, torsemide was discontinued with return of SOB symptoms.  Currently taking torsemide 10 mg dose instead 20 mg dose.  She says 10 mg dose is not helping with her SOB.  She was seen by pulmonologist who recommended weaning off metoprolol but probably could help with her breathing.  PFTs are pending.  She was told by her pulmonologist that she does not have COPD.  No other symptoms, no syncope, palpitations, leg swelling.  CT cardiac showed coronary calcium score of 0 and 0 plaque.  Past Medical History:  Diagnosis Date   Anxiety    Bipolar disorder [facility])    Brain tumor (benign) (HCC)    Common migraine with intractable migraine 02/06/2021   Depression    HTN (hypertension)    Kidney disease    Mania (HCC)    Meningioma (HCC)    Migraine    Tremor 02/06/2021   Lithium induced    Past Surgical History:  Procedure Laterality Date   ABDOMINAL HYSTERECTOMY     BRAIN SURGERY     breast tumor removal     COLONOSCOPY WITH PROPOFOL N/A 09/03/2023   Procedure: COLONOSCOPY WITH PROPOFOL;  Surgeon: Dolores Frame, MD;  [facility];  Service: Gastroenterology;  Laterality: N/A;  7:30am;asa 1   INCONTINENCE SURGERY     INTERSTIM IMPLANT PLACEMENT     KNEE SURGERY     right x 2   PLANTAR FASCIECTOMY Left 03/05/16   POLYPECTOMY  09/03/2023   Procedure: POLYPECTOMY;  Surgeon: Dolores Frame, MD;  [facility];   Service: Gastroenterology;;   TUBAL LIGATION      Current Outpatient Medications  Medication Sig Dispense Refill   acetaminophen (TYLENOL) 325 MG tablet Take 650 mg by mouth as needed.     amLODipine (NORVASC) 5 MG tablet Take 5 mg by mouth daily.     aspirin EC 81 MG tablet Take 81 mg by mouth daily. Swallow whole.     budesonide-formoterol (SYMBICORT) 80-4.5 MCG/ACT inhaler Inhale 2 puffs into the lungs 2 (two) times daily.     buPROPion (WELLBUTRIN XL) 300 MG 24 hr tablet Take 1 tablet (300 mg total) by mouth daily. TAKE ONE TABLET BY MOUTH EVERY MORINING FOR DEPRESSION 90 tablet 0   clonazePAM (KLONOPIN) 1 MG tablet Take 1 tablet (1 mg total) by mouth at bedtime. 90 tablet 0   cyanocobalamin (VITAMIN B12) 1000 MCG tablet Take 1,000 mcg by mouth daily.     hydrOXYzine (ATARAX) 25 MG tablet Take one tab as needed at bedtime. 30 tablet 0   Melatonin 5 MG CAPS Take 10 mg by mouth at bedtime.     metoprolol succinate (TOPROL XL) 25 MG 24 hr tablet Take 1 tablet by mouth daily for 1 week, then stop. 7 tablet 0   potassium chloride SA (KLOR-CON M) 20 MEQ tablet Take 1 tablet (20 mEq total) by  mouth daily. 30 tablet 3   prochlorperazine (COMPAZINE) 10 MG tablet Take 1 tablet (10 mg total) by mouth 2 (two) times daily as needed for nausea or vomiting. 10 tablet 0   risperiDONE (RISPERDAL) 1 MG tablet Take 1 tablet (1 mg total) by mouth at bedtime. 90 tablet 0   torsemide (DEMADEX) 20 MG tablet Take 0.5 tablets (10 mg total) by mouth daily. 07/01/2023-New     No current facility-administered medications for this visit.   Allergies:  Gabapentin, Oxcarbazepine, Tegretol [carbamazepine], Codeine, Divalproex sodium, Erythromycin, Phenytoin sodium extended, and Phenytoin sodium extended   Social History: The patient  reports that she has never smoked. She has never used smokeless tobacco. She reports that she does not drink alcohol and does not use drugs.   Family History: The patient's family  history includes Alcohol abuse in her father; Breast cancer in her mother; Diabetes in her father; Drug abuse in her brother; Mental illness in her mother; Mental retardation in her father.   ROS:  Please see the history of present illness. Otherwise, complete review of systems is positive for none.  All other systems are reviewed and negative.   Physical Exam: VS:  BP 104/76   Pulse 84   Ht 5\' 3"  (1.6 m)   Wt 205 lb (93 kg)   SpO2 97%   BMI 36.31 kg/m , BMI Body mass index is 36.31 kg/m.  Wt Readings from Last 3 Encounters:  10/10/23 205 lb (93 kg)  09/03/23 199 lb (90.3 kg)  07/01/23 204 lb 9.6 oz (92.8 kg)    General: Patient appears comfortable at rest. HEENT: Conjunctiva and lids normal, oropharynx clear with moist mucosa. Neck: Supple, no elevated JVP or carotid bruits, no thyromegaly. Lungs: Clear to auscultation, nonlabored breathing at rest. Cardiac: Regular rate and rhythm, no S3 or significant systolic murmur, no pericardial rub. Abdomen: Soft, nontender, no hepatomegaly, bowel sounds present, no guarding or rebound. Extremities: No pitting edema, distal pulses 2+. Skin: Warm and dry. Musculoskeletal: No kyphosis. Neuropsychiatric: Alert and oriented x3, affect grossly appropriate.  Recent Labwork: 06/29/2023: B Natriuretic Peptide 20.0; Hemoglobin 15.6; Magnesium 2.4; Platelets 343 07/31/2023: BUN 13; Creatinine, Ser 1.14; Potassium 4.4; Sodium 141     Component Value Date/Time   CHOL 174 05/13/2013 0938   TRIG 48 05/13/2013 0938   HDL 69 05/13/2013 0938   CHOLHDL 2.5 05/13/2013 0938   VLDL 10 05/13/2013 0938   LDLCALC 95 05/13/2013 0938     Assessment and Plan:  DOE unclear etiology: Ongoing DOE since May 2024.  No orthopnea or PND.  No leg swelling.  Echocardiogram showed normal LVEF, G1 DD with normal LVEDP and no valvular heart disease.  CT cardiac showed coronary calcium score 0 and 0 plaque.  Will wean off metoprolol completely and continue torsemide 10 mg  once daily for now.  After weaning of metoprolol, patient is instructed to call the clinic in 2 weeks and inform as for any improvement in her DOE.  If not, we will give torsemide 20 mg once daily and reevaluate her symptoms and BMP.  Continue current potassium supplements.  HTN, controlled: Continue amlodipine 5 mg once daily, wean off metoprolol.  Chlorthalidone was switched to torsemide, continue torsemide 10 mg once daily for now.   I have spent a total duration of 30 minutes reviewing the medications, labs, discussing her symptoms, face-to-face counseling of her medical condition, evaluation, management, ordering medications and documenting the findings in the note.    Medication Adjustments/Labs  and Tests Ordered: Current medicines are reviewed at length with the patient today.  Concerns regarding medicines are outlined above.    Disposition:  Follow up   Signed, Oluwatosin Higginson Verne Spurr, MD, 10/10/2023 1:50 PM    [facility] at [facility] [address], [place], [place] [address]

## 2023-10-16 ENCOUNTER — Encounter: Payer: Self-pay | Admitting: Internal Medicine

## 2023-10-17 ENCOUNTER — Encounter (HOSPITAL_COMMUNITY): Payer: Self-pay

## 2023-10-31 ENCOUNTER — Other Ambulatory Visit (HOSPITAL_COMMUNITY): Payer: Self-pay | Admitting: Psychiatry

## 2023-10-31 DIAGNOSIS — F411 Generalized anxiety disorder: Secondary | ICD-10-CM

## 2023-11-03 MED ORDER — HYDROXYZINE HCL 25 MG PO TABS: ORAL_TABLET | ORAL | 0 refills | Status: DC

## 2023-11-05 ENCOUNTER — Other Ambulatory Visit: Payer: Self-pay

## 2023-11-05 MED ORDER — METOPROLOL SUCCINATE ER 25 MG PO TB24: 25.0000 mg | ORAL_TABLET | Freq: Every day | ORAL | 3 refills | Status: DC

## 2023-12-02 ENCOUNTER — Encounter: Payer: Self-pay | Admitting: Internal Medicine

## 2024-01-01 ENCOUNTER — Other Ambulatory Visit (HOSPITAL_COMMUNITY): Payer: Self-pay | Admitting: Psychiatry

## 2024-01-01 DIAGNOSIS — F411 Generalized anxiety disorder: Secondary | ICD-10-CM

## 2024-01-02 ENCOUNTER — Other Ambulatory Visit (HOSPITAL_COMMUNITY): Payer: Self-pay | Admitting: *Deleted

## 2024-01-02 DIAGNOSIS — F411 Generalized anxiety disorder: Secondary | ICD-10-CM

## 2024-01-02 MED ORDER — HYDROXYZINE HCL 25 MG PO TABS: ORAL_TABLET | ORAL | 0 refills | Status: DC

## 2024-01-05 ENCOUNTER — Telehealth (HOSPITAL_BASED_OUTPATIENT_CLINIC_OR_DEPARTMENT_OTHER): Payer: Medicare PPO | Admitting: Psychiatry

## 2024-01-05 ENCOUNTER — Encounter (HOSPITAL_COMMUNITY): Payer: Self-pay | Admitting: Psychiatry

## 2024-01-05 VITALS — Wt 205.0 lb

## 2024-01-05 DIAGNOSIS — F411 Generalized anxiety disorder: Secondary | ICD-10-CM

## 2024-01-05 DIAGNOSIS — F319 Bipolar disorder, unspecified: Secondary | ICD-10-CM

## 2024-01-05 DIAGNOSIS — F09 Unspecified mental disorder due to known physiological condition: Secondary | ICD-10-CM

## 2024-01-05 MED ORDER — BUPROPION HCL ER (XL) 300 MG PO TB24
300.0000 mg | ORAL_TABLET | Freq: Every day | ORAL | 0 refills | Status: DC
Start: 2024-01-05 — End: 2024-04-05

## 2024-01-05 MED ORDER — HYDROXYZINE HCL 25 MG PO TABS
ORAL_TABLET | ORAL | 0 refills | Status: DC
Start: 2024-01-05 — End: 2024-04-05

## 2024-01-05 MED ORDER — RISPERIDONE 1 MG PO TABS
1.0000 mg | ORAL_TABLET | Freq: Every day | ORAL | 0 refills | Status: DC
Start: 2024-01-05 — End: 2024-04-05

## 2024-01-05 MED ORDER — CLONAZEPAM 1 MG PO TABS
1.0000 mg | ORAL_TABLET | Freq: Every day | ORAL | 0 refills | Status: DC
Start: 2024-01-05 — End: 2024-04-05

## 2024-01-05 NOTE — Progress Notes (Signed)
 [facility] MD Virtual Progress Note   Patient Location: Home Provider Location: Home Office  I connect with patient by video and verified that I am speaking with correct person by using two identifiers. I discussed the limitations of evaluation and management by telemedicine and the availability of in person appointments. I also discussed with the patient that there may be a patient responsible charge related to this service. The patient expressed understanding and agreed to proceed.  [name] [id number] 63 y.o.  01/05/2024 2:30 PM  History of Present Illness:  Patient is evaluated by video session.  She reported things are going okay and taking her medication as prescribed.  She understands medicine helping her mood and her husband Sherida make sure that she takes the medicine.  In the past she had cut down the medication and her paranoia got worse.  She had a good Christmas.  It was quiet but she enjoyed.  She does not go outside as she preferred to stay with herself.  She denies any hallucination, suicidal thoughts or homicidal thoughts.  She admitted struggle with attention concentration and forgetfulness.  She had an MRI which shows chronic microvascular ischemia and she is aware about it.  Her husband is very helpful.  She has appointment coming up with a kidney doctor next month.  She reported having swelling in her hands and puffiness.  Her cardiologist is trying to adjust her medication.  Patient reported that she has very low blood pressure or very high blood pressure.  She has left a message for her provider about her concern related to swelling and hoping to have a call back.  She is taking fluid pill but sometimes it does not work very well.  She enjoys the company of grandkids.  Her son live close by and she see the 6-year-old and 62-year-old on a regular basis.  Her appetite is fair.  Her weight is stable.  She wants to continue Risperdal , Wellbutrin  and Klonopin .  We  have cut down the Risperdal  from 2 mg to 1 mg and she feel her paranoia is not as bad.  Past Psychiatric History: H/O bipolar disorder and multiple inpatient treatment.  Last inpatient in September 2014 for suicidal thinking, hallucination as playing with guns and superficially cut her wrist with a needle.  H/O mania and psychosis. Tried Zyprexa Seroquel but stopped due to weight gain.  Tegretol  caused increased paranoia confusion and required inpatient.    Outpatient Encounter Medications as of 01/05/2024  Medication Sig   acetaminophen  (TYLENOL ) 325 MG tablet Take 650 mg by mouth as needed.   amLODipine (NORVASC) 5 MG tablet Take 5 mg by mouth daily.   aspirin EC 81 MG tablet Take 81 mg by mouth daily. Swallow whole.   budesonide-formoterol (SYMBICORT) 80-4.5 MCG/ACT inhaler Inhale 2 puffs into the lungs 2 (two) times daily.   buPROPion  (WELLBUTRIN  XL) 300 MG 24 hr tablet Take 1 tablet (300 mg total) by mouth daily. TAKE ONE TABLET BY MOUTH EVERY MORINING FOR DEPRESSION   clonazePAM  (KLONOPIN ) 1 MG tablet Take 1 tablet (1 mg total) by mouth at bedtime.   cyanocobalamin (VITAMIN B12) 1000 MCG tablet Take 1,000 mcg by mouth daily.   hydrOXYzine  (ATARAX ) 25 MG tablet Take one tab as needed at bedtime.   Melatonin 5 MG CAPS Take 10 mg by mouth at bedtime.   metoprolol  succinate (TOPROL  XL) 25 MG 24 hr tablet Take 1 tablet (25 mg total) by mouth daily.   potassium chloride   SA (KLOR-CON  M) 20 MEQ tablet Take 1 tablet (20 mEq total) by mouth daily.   prochlorperazine  (COMPAZINE ) 10 MG tablet Take 1 tablet (10 mg total) by mouth 2 (two) times daily as needed for nausea or vomiting.   risperiDONE  (RISPERDAL ) 1 MG tablet Take 1 tablet (1 mg total) by mouth at bedtime.   torsemide  (DEMADEX ) 20 MG tablet Take 0.5 tablets (10 mg total) by mouth daily. 07/01/2023-New   No facility-administered encounter medications on file as of 01/05/2024.    No results found for this or any previous visit (from the past  2160 hours).   Psychiatric Specialty Exam: Physical Exam  Review of Systems  Constitutional:        Puffiness and swelling in arm    Weight 205 lb (93 kg).There is no height or weight on file to calculate BMI.  General Appearance: Casual  Eye Contact:  Good  Speech:  Slow  Volume:  Decreased  Mood:  Anxious  Affect:  Congruent  Thought Process:  Descriptions of Associations: Intact  Orientation:  Full (Time, Place, and Person)  Thought Content:  Rumination  Suicidal Thoughts:  No  Homicidal Thoughts:  No  Memory:  Immediate;   Fair Recent;   Fair Remote;   Fair  Judgement:  Intact  Insight:  Present  Psychomotor Activity:  Decreased  Concentration:  Concentration: Fair and Attention Span: Fair  Recall:  [employer] of Knowledge:  Fair  Language:  Fair  Akathisia:  No  Handed:  Right  AIMS (if indicated):     Assets:  Communication Skills Desire for Improvement Housing Resilience Social Support  ADL's:  Intact  Cognition:  WNL  Sleep:  6-8 hrs     Assessment/Plan: Bipolar I disorder (HCC) - Plan: buPROPion  (WELLBUTRIN  XL) 300 MG 24 hr tablet, clonazePAM  (KLONOPIN ) 1 MG tablet, risperiDONE  (RISPERDAL ) 1 MG tablet  GAD (generalized anxiety disorder) - Plan: buPROPion  (WELLBUTRIN  XL) 300 MG 24 hr tablet, hydrOXYzine  (ATARAX ) 25 MG tablet  Mild cognitive disorder - Plan: buPROPion  (WELLBUTRIN  XL) 300 MG 24 hr tablet  I reviewed current medication.  She is taking Risperdal  1 mg which we cut down from 2 mg to help her attention concentration.  She wants to cut down further however I recommend in the past it did not help and her paranoia come back.  However I recommend she can try on her own to cut down the pill and if noticed her symptoms coming back then she need to go back to full dose of 1 mg.  She agreed with the plan.  Continue Wellbutrin  XL 300 mg daily and Klonopin  1 mg at bedtime.  Recommended to call us  back if she is any question or any concern.  Follow-up in 3  months   Follow Up Instructions:     I discussed the assessment and treatment plan with the patient. The patient was provided an opportunity to ask questions and all were answered. The patient agreed with the plan and demonstrated an understanding of the instructions.   The patient was advised to call back or seek an in-person evaluation if the symptoms worsen or if the condition fails to improve as anticipated.    Collaboration of Care: Other provider involved in patient's care AEB notes are available in epic to review  Patient/Guardian was advised Release of Information must be obtained prior to any record release in order to collaborate their care with an outside provider. Patient/Guardian was advised if they have not already  done so to contact the registration department to sign all necessary forms in order for us  to release information regarding their care.   Consent: Patient/Guardian gives verbal consent for treatment and assignment of benefits for services provided during this visit. Patient/Guardian expressed understanding and agreed to proceed.     I provided 25 minutes of non face to face time during this encounter.  Note: This document was prepared by [employer] voice dictation technology and any errors that results from this process are unintentional.    Leni ONEIDA Client, MD 01/05/2024

## 2024-01-08 ENCOUNTER — Telehealth: Payer: Self-pay

## 2024-01-08 ENCOUNTER — Encounter: Payer: Self-pay | Admitting: Internal Medicine

## 2024-01-08 ENCOUNTER — Ambulatory Visit: Payer: Medicare PPO | Attending: Internal Medicine | Admitting: Internal Medicine

## 2024-01-08 VITALS — Wt 203.5 lb

## 2024-01-08 DIAGNOSIS — R0609 Other forms of dyspnea: Secondary | ICD-10-CM | POA: Diagnosis not present

## 2024-01-08 DIAGNOSIS — Z79899 Other long term (current) drug therapy: Secondary | ICD-10-CM | POA: Diagnosis not present

## 2024-01-08 NOTE — Progress Notes (Signed)
Virtual Visit via Telephone Note   Because of [name]'s co-morbid illnesses, she is at least at moderate risk for complications without adequate follow up.  This format is felt to be most appropriate for this patient at this time.  The patient did not have access to video technology/had technical difficulties with video requiring transitioning to audio format only (telephone).  All issues noted in this document were discussed and addressed.  No physical exam could be performed with this format.  Please refer to the patient's chart for her consent to telehealth for [facility].    Date:  01/08/2024   ID:  [name], DOB [birth year], MRN [id number] The patient was identified using 2 identifiers.  Patient Location: Home Provider Location: Office/Clinic   PCP:  Alinda Deem, MD   [facility] Providers Cardiologist:  Marjo Bicker, MD     Evaluation Performed:  Follow-Up Visit  Chief Complaint:  Discuss echo results  History of Present Illness:    [name] is a 63 y.o. female with HTN is here for follow-up visit.  Ongoing DOE since May 2024 with no orthopnea or PND.  Echocardiogram showed normal LVEF, no valvular disease, G1 DD with normal LVEDP.  She was started on p.o. torsemide 20 mg with AKI but with improvement in her DOE symptoms.  Due to a day, torsemide dose was discontinued with return of SOB symptoms, currently taking torsemide 10 mg dose instead of 20 mg dose.  She was seen by pulmonologist in [place] who performed PFTs and informed her that she does not have any COPD.  She is here for follow-up visit.  To discuss echo results and discuss her symptoms.  Continues to have DOE with no orthopnea or PND.  She reported having swelling of her arms and legs.  Taking torsemide 10 mg with no significant relief.  No other symptoms of dizziness, syncope, palpitations.  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  She denies any angina.   Past Medical History:  Diagnosis Date   Anxiety    Bipolar disorder [facility])    Brain tumor (benign) (HCC)    Common migraine with intractable migraine 02/06/2021   Depression    HTN (hypertension)    Kidney disease    Mania (HCC)    Meningioma (HCC)    Migraine    Tremor 02/06/2021   Lithium induced   Past Surgical History:  Procedure Laterality Date   ABDOMINAL HYSTERECTOMY     BRAIN SURGERY     breast tumor removal     COLONOSCOPY WITH PROPOFOL N/A 09/03/2023   Procedure: COLONOSCOPY WITH PROPOFOL;  Surgeon: Dolores Frame, MD;  [facility];  Service: Gastroenterology;  Laterality: N/A;  7:30am;asa 1   INCONTINENCE SURGERY     INTERSTIM IMPLANT PLACEMENT     KNEE SURGERY     right x 2   PLANTAR FASCIECTOMY Left 03/05/16   POLYPECTOMY  09/03/2023   Procedure: POLYPECTOMY;  Surgeon: Marguerita Merles, Reuel Boom, MD;  [facility];  Service: Gastroenterology;;   TUBAL LIGATION       Current Meds  Medication Sig   acetaminophen (TYLENOL) 325 MG tablet Take 650 mg by mouth as needed.   amLODipine (NORVASC) 5 MG tablet Take 5 mg by mouth daily.  aspirin EC 81 MG tablet Take 81 mg by mouth daily. Swallow whole.   buPROPion (WELLBUTRIN XL) 300 MG 24 hr tablet Take 1 tablet (300 mg total) by mouth daily. TAKE ONE TABLET BY MOUTH EVERY MORINING FOR DEPRESSION   clonazePAM (KLONOPIN) 1 MG tablet Take 1 tablet (1 mg total) by mouth at bedtime.   cyanocobalamin (VITAMIN B12) 1000 MCG tablet Take 1,000 mcg by mouth daily.   dorzolamide-timolol (COSOPT) 2-0.5 % ophthalmic solution Place 1 drop into both eyes 2 (two) times daily.   hydrOXYzine (ATARAX) 25 MG tablet Take one tab as needed at bedtime.   Melatonin 5 MG CAPS Take 10 mg by mouth at bedtime.   metoprolol succinate (TOPROL XL) 25 MG 24 hr tablet Take 1 tablet (25 mg  total) by mouth daily.   potassium chloride SA (KLOR-CON M) 20 MEQ tablet Take 1 tablet (20 mEq total) by mouth daily.   prochlorperazine (COMPAZINE) 10 MG tablet Take 1 tablet (10 mg total) by mouth 2 (two) times daily as needed for nausea or vomiting.   risperiDONE (RISPERDAL) 1 MG tablet Take 1 tablet (1 mg total) by mouth at bedtime.   torsemide (DEMADEX) 20 MG tablet Take 0.5 tablets (10 mg total) by mouth daily. 07/01/2023-New     Allergies:   Gabapentin, Oxcarbazepine, Tegretol [carbamazepine], Codeine, Divalproex sodium, Erythromycin, Phenytoin sodium extended, and Phenytoin sodium extended   Social History   Tobacco Use   Smoking status: Never   Smokeless tobacco: Never  Vaping Use   Vaping status: Never Used  Substance Use Topics   Alcohol use: No    Alcohol/week: 0.0 standard drinks of alcohol   Drug use: No     Family Hx: The patient's family history includes Alcohol abuse in her father; Breast cancer in her mother; Diabetes in her father; Drug abuse in her brother; Mental illness in her mother; Mental retardation in her father. There is no history of Dementia, ADD / ADHD, Anxiety disorder, Bipolar disorder, Depression, OCD, Paranoid behavior, Schizophrenia, Seizures, Sexual abuse, Physical abuse, or Suicidality.  ROS:   Please see the history of present illness.     All other systems reviewed and are negative.   Prior CV studies:   The following studies were reviewed today:    Labs/Other Tests and Data Reviewed:    EKG:    Recent Labs: 06/29/2023: B Natriuretic Peptide 20.0; Hemoglobin 15.6; Magnesium 2.4; Platelets 343 07/31/2023: BUN 13; Creatinine, Ser 1.14; Potassium 4.4; Sodium 141   Recent Lipid Panel Lab Results  Component Value Date/Time   CHOL 174 05/13/2013 09:38 AM   TRIG 48 05/13/2013 09:38 AM   HDL 69 05/13/2013 09:38 AM   CHOLHDL 2.5 05/13/2013 09:38 AM   LDLCALC 95 05/13/2013 09:38 AM    Wt Readings from Last 3 Encounters:  01/08/24 203  lb 8 oz (92.3 kg)  10/10/23 205 lb (93 kg)  09/03/23 199 lb (90.3 kg)          Objective:    Vital Signs:  Wt 203 lb 8 oz (92.3 kg)   BMI 36.05 kg/m      ASSESSMENT & PLAN:     DOE, unclear etiology: Continues to have DOE since May 2024 with no orthopnea or PND.  Has leg swelling and arm swelling now.  BNP from July 2024 was within normal limits.  Will obtain BNP today.  Echo from 2024 showed normal LVEF, G1 DD with normal LVEDP and no valvular heart disease.  CT cardiac showed  coronary calcium score 0 and no plaque.  She had a PFTs in [place] and was told by her pulmonologist that she does not have COPD.  I do not think her DOE is cardiac in origin.  Will follow-up on the BNP.  Will stop torsemide and potassium supplements.  Follow-up with PCP for noncardiac causes of DOE.  HTN, controlled: On amlodipine 5 mg once daily and metoprolol XL 25 mg once daily, which I will continue.  Follows up with PCP.    I answered all the questions.   Time:   Today, I have spent 30 minutes with the patient with telehealth technology discussing the above problems.     Medication Adjustments/Labs and Tests Ordered: Current medicines are reviewed at length with the patient today.  Concerns regarding medicines are outlined above.   Tests Ordered: No orders of the defined types were placed in this encounter.   Medication Changes: No orders of the defined types were placed in this encounter.   Follow Up: 6 months in office   Signed, Marjo Bicker, MD  01/08/2024 9:03 AM    [facility]

## 2024-01-08 NOTE — Patient Instructions (Addendum)
Medication Instructions:  Your physician has recommended you make the following change in your medication:  Stop taking Torsemide and Potassium Chloride Continue taking all other medications as prescribed  Labwork: BNP to be completed today at [facility]  Testing/Procedures: None  Follow-Up: Your physician recommends that you schedule a follow-up appointment in: 6 months  Any Other Special Instructions Will Be Listed Below (If Applicable). Thank you for choosing [facility]!      If you need a refill on your cardiac medications before your next appointment, please call your pharmacy.

## 2024-01-08 NOTE — Telephone Encounter (Signed)
  Patient Consent for Virtual Visit        [name] has provided verbal consent on 01/08/2024 for a virtual visit (video or telephone).   CONSENT FOR VIRTUAL VISIT FOR:  [name]  By participating in this virtual visit I agree to the following:  I hereby voluntarily request, consent and authorize [facility] and its employed or contracted physicians, physician assistants, nurse practitioners or other licensed health care professionals (the Practitioner), to provide me with telemedicine health care services (the "Services") as deemed necessary by the treating Practitioner. I acknowledge and consent to receive the Services by the Practitioner via telemedicine. I understand that the telemedicine visit will involve communicating with the Practitioner through live audiovisual communication technology and the disclosure of certain medical information by electronic transmission. I acknowledge that I have been given the opportunity to request an in-person assessment or other available alternative prior to the telemedicine visit and am voluntarily participating in the telemedicine visit.  I understand that I have the right to withhold or withdraw my consent to the use of telemedicine in the course of my care at any time, without affecting my right to future care or treatment, and that the Practitioner or I may terminate the telemedicine visit at any time. I understand that I have the right to inspect all information obtained and/or recorded in the course of the telemedicine visit and may receive copies of available information for a reasonable fee.  I understand that some of the potential risks of receiving the Services via telemedicine include:  Delay or interruption in medical evaluation due to technological equipment failure or disruption; Information transmitted may not be sufficient (e.g. poor resolution of images) to allow for appropriate medical decision making by the Practitioner;  and/or  In rare instances, security protocols could fail, causing a breach of personal health information.  Furthermore, I acknowledge that it is my responsibility to provide information about my medical history, conditions and care that is complete and accurate to the best of my ability. I acknowledge that Practitioner's advice, recommendations, and/or decision may be based on factors not within their control, such as incomplete or inaccurate data provided by me or distortions of diagnostic images or specimens that may result from electronic transmissions. I understand that the practice of medicine is not an exact science and that Practitioner makes no warranties or guarantees regarding treatment outcomes. I acknowledge that a copy of this consent can be made available to me via my patient portal [facility] MyChart), or I can request a printed copy by calling the office of [facility].    I understand that my insurance will be billed for this visit.   I have read or had this consent read to me. I understand the contents of this consent, which adequately explains the benefits and risks of the Services being provided via telemedicine.  I have been provided ample opportunity to ask questions regarding this consent and the Services and have had my questions answered to my satisfaction. I give my informed consent for the services to be provided through the use of telemedicine in my medical care

## 2024-01-20 ENCOUNTER — Ambulatory Visit: Payer: Medicare PPO | Admitting: Internal Medicine

## 2024-01-25 ENCOUNTER — Encounter: Payer: Self-pay | Admitting: Internal Medicine

## 2024-03-05 ENCOUNTER — Ambulatory Visit: Payer: Medicare PPO | Admitting: Internal Medicine

## 2024-03-21 ENCOUNTER — Other Ambulatory Visit: Payer: Self-pay | Admitting: Internal Medicine

## 2024-03-22 ENCOUNTER — Other Ambulatory Visit: Payer: Self-pay | Admitting: Internal Medicine

## 2024-04-05 ENCOUNTER — Encounter (HOSPITAL_COMMUNITY): Payer: Self-pay | Admitting: Psychiatry

## 2024-04-05 ENCOUNTER — Telehealth (HOSPITAL_BASED_OUTPATIENT_CLINIC_OR_DEPARTMENT_OTHER): Payer: Medicare PPO | Admitting: Psychiatry

## 2024-04-05 DIAGNOSIS — F319 Bipolar disorder, unspecified: Secondary | ICD-10-CM | POA: Diagnosis not present

## 2024-04-05 DIAGNOSIS — F09 Unspecified mental disorder due to known physiological condition: Secondary | ICD-10-CM

## 2024-04-05 DIAGNOSIS — F411 Generalized anxiety disorder: Secondary | ICD-10-CM | POA: Diagnosis not present

## 2024-04-05 MED ORDER — CLONAZEPAM 1 MG PO TABS: 1.0000 mg | ORAL_TABLET | Freq: Every day | ORAL | 0 refills | Status: DC

## 2024-04-05 MED ORDER — HYDROXYZINE HCL 25 MG PO TABS: ORAL_TABLET | ORAL | 0 refills | Status: DC

## 2024-04-05 MED ORDER — BUPROPION HCL ER (XL) 300 MG PO TB24: 300.0000 mg | ORAL_TABLET | Freq: Every day | ORAL | 0 refills | Status: DC

## 2024-04-05 MED ORDER — RISPERIDONE 1 MG PO TABS
1.0000 mg | ORAL_TABLET | Freq: Every day | ORAL | 0 refills | Status: DC
Start: 2024-04-05 — End: 2024-07-05

## 2024-04-05 NOTE — Progress Notes (Signed)
 [facility] MD Virtual Progress Note   Patient Location: Home Provider Location: Home Office  I connect with patient by video and verified that I am speaking with correct person by using two identifiers. I discussed the limitations of evaluation and management by telemedicine and the availability of in person appointments. I also discussed with the patient that there may be a patient responsible charge related to this service. The patient expressed understanding and agreed to proceed.  [name] [id number] 63 y.o.  04/05/2024 2:37 PM  History of Present Illness:  Patient is evaluated by video session.  She recently had surgery for rotator cuff and manipulation of the right shoulder joint.  She is in braces and has not started physical therapy.  She has appointment coming up on the 24th for evaluation.  Overall she describes mood is good and denies any mania, psychosis, hallucination.  She cut down the risperidone and taking half tablet and do not feel her mood is getting worse.  She is sleeping better.  She admitted taking pain medicine but that has cut down significantly.  She had a good support from her husband.  Patient told yesterday she went to see the ballgame as one of her grandchild was 70 years old played basketball.  Patient told her granddaughter who is 68 also played ball game.  Patient appears pleasant and is stable.  She denies any panic attack, crying spells or any feeling of hopelessness or worthlessness.  Her son lives close by and she is very happy that he is very supportive.  Patient has no tremors shakes or any EPS.  She sleeps good with the help of Klonopin and hydroxyzine.  She denies drinking or using any illegal substances.  She does not drive and her husband or son takes her to the doctor's appointment.  Past Psychiatric History: H/O bipolar disorder and multiple inpatient treatment.  Last inpatient in September 2014 for suicidal thinking, hallucination as  playing with guns and superficially cut her wrist with a needle.  H/O mania and psychosis. Tried Zyprexa Seroquel but stopped due to weight gain.  Tegretol caused increased paranoia confusion and required inpatient.     Outpatient Encounter Medications as of 04/05/2024  Medication Sig   acetaminophen (TYLENOL) 325 MG tablet Take 650 mg by mouth as needed.   amLODipine (NORVASC) 5 MG tablet Take 5 mg by mouth daily.   aspirin EC 81 MG tablet Take 81 mg by mouth daily. Swallow whole.   budesonide-formoterol (SYMBICORT) 80-4.5 MCG/ACT inhaler Inhale 2 puffs into the lungs 2 (two) times daily. (Patient not taking: Reported on 01/08/2024)   buPROPion (WELLBUTRIN XL) 300 MG 24 hr tablet Take 1 tablet (300 mg total) by mouth daily. TAKE ONE TABLET BY MOUTH EVERY MORINING FOR DEPRESSION   clonazePAM (KLONOPIN) 1 MG tablet Take 1 tablet (1 mg total) by mouth at bedtime.   cyanocobalamin (VITAMIN B12) 1000 MCG tablet Take 1,000 mcg by mouth daily.   dorzolamide-timolol (COSOPT) 2-0.5 % ophthalmic solution Place 1 drop into both eyes 2 (two) times daily.   hydrOXYzine (ATARAX) 25 MG tablet Take one tab as needed at bedtime.   Melatonin 5 MG CAPS Take 10 mg by mouth at bedtime.   metoprolol succinate (TOPROL XL) 25 MG 24 hr tablet Take 1 tablet (25 mg total) by mouth daily.   prochlorperazine (COMPAZINE) 10 MG tablet Take 1 tablet (10 mg total) by mouth 2 (two) times daily as needed for nausea or vomiting.   risperiDONE (  RISPERDAL) 1 MG tablet Take 1 tablet (1 mg total) by mouth at bedtime.   No facility-administered encounter medications on file as of 04/05/2024.    No results found for this or any previous visit (from the past 2160 hours).   Psychiatric Specialty Exam: Physical Exam  Review of Systems  Musculoskeletal:        Right shoulder pain    Weight 205 lb (93 kg).There is no height or weight on file to calculate BMI.  General Appearance: Casual  Eye Contact:  Good  Speech:  Normal Rate   Volume:  Normal  Mood:  Euthymic  Affect:  Appropriate  Thought Process:  Goal Directed  Orientation:  Full (Time, Place, and Person)  Thought Content:  Logical  Suicidal Thoughts:  No  Homicidal Thoughts:  No  Memory:  Immediate;   Good Recent;   Good Remote;   Good  Judgement:  Good  Insight:  Present  Psychomotor Activity:  Normal  Concentration:  Concentration: Fair and Attention Span: Fair  Recall:  Fair  Fund of Knowledge:  Good  Language:  Good  Akathisia:  No  Handed:  Right  AIMS (if indicated):     Assets:  Communication Skills Desire for Improvement Housing Social Support  ADL's:  Intact  Cognition:  WNL  Sleep:  fair        No data to display          Assessment/Plan: Bipolar I disorder (HCC) - Plan: buPROPion (WELLBUTRIN XL) 300 MG 24 hr tablet, risperiDONE (RISPERDAL) 1 MG tablet, clonazePAM (KLONOPIN) 1 MG tablet  GAD (generalized anxiety disorder) - Plan: buPROPion (WELLBUTRIN XL) 300 MG 24 hr tablet, hydrOXYzine (ATARAX) 25 MG tablet  Mild cognitive disorder - Plan: buPROPion (WELLBUTRIN XL) 300 MG 24 hr tablet  Patient doing better on medication.  She cut down her respite all and taking half tablet.  She used to take 2 mg but gradually the dose has reduced as she continues to do better.  She denies any hallucination.  I encouraged to keep the current dose of Risperdal which is 0.5 mg however if she feels worsening of symptoms, paranoia and mood swings that she can take the full dose 1 mg.  Continue Klonopin 1 mg at bedtime and hydroxyzine 25 mg at bedtime along with Wellbutrin XL 300 mg daily.  Patient has appointment coming up to see her surgeon and may start physical therapy on 24th.  I recommend to call us back if she is any question or any concern.  Follow-up in 3 months.   Follow Up Instructions:     I discussed the assessment and treatment plan with the patient. The patient was provided an opportunity to ask questions and all were answered.  The patient agreed with the plan and demonstrated an understanding of the instructions.   The patient was advised to call back or seek an in-person evaluation if the symptoms worsen or if the condition fails to improve as anticipated.    Collaboration of Care: Other provider involved in patient's care AEB notes are available in epic to review  Patient/Guardian was advised Release of Information must be obtained prior to any record release in order to collaborate their care with an outside provider. Patient/Guardian was advised if they have not already done so to contact the registration department to sign all necessary forms in order for [place] to release information regarding their care.   Consent: Patient/Guardian gives verbal consent for treatment and assignment of benefits  for services provided during this visit. Patient/Guardian expressed understanding and agreed to proceed.     Total encounter time 18 minutes which includes face-to-face time, chart reviewed, care coordination, order entry and documentation during this encounter.   Note: This document was prepared by [employer] voice dictation technology and any errors that results from this process are unintentional.    Arturo Late, MD 04/05/2024

## 2024-06-20 ENCOUNTER — Other Ambulatory Visit (HOSPITAL_COMMUNITY): Payer: Self-pay | Admitting: Psychiatry

## 2024-06-20 DIAGNOSIS — F411 Generalized anxiety disorder: Secondary | ICD-10-CM

## 2024-07-05 ENCOUNTER — Telehealth (HOSPITAL_BASED_OUTPATIENT_CLINIC_OR_DEPARTMENT_OTHER): Admitting: Psychiatry

## 2024-07-05 ENCOUNTER — Encounter (HOSPITAL_COMMUNITY): Payer: Self-pay | Admitting: Psychiatry

## 2024-07-05 DIAGNOSIS — F319 Bipolar disorder, unspecified: Secondary | ICD-10-CM

## 2024-07-05 DIAGNOSIS — F411 Generalized anxiety disorder: Secondary | ICD-10-CM | POA: Diagnosis not present

## 2024-07-05 DIAGNOSIS — F09 Unspecified mental disorder due to known physiological condition: Secondary | ICD-10-CM

## 2024-07-05 MED ORDER — RISPERIDONE 1 MG PO TABS: 1.0000 mg | ORAL_TABLET | Freq: Every day | ORAL | 0 refills | Status: DC

## 2024-07-05 MED ORDER — CLONAZEPAM 1 MG PO TABS: 1.0000 mg | ORAL_TABLET | Freq: Every day | ORAL | 0 refills | Status: DC

## 2024-07-05 MED ORDER — BUPROPION HCL ER (XL) 300 MG PO TB24: 300.0000 mg | ORAL_TABLET | Freq: Every day | ORAL | 0 refills | Status: DC

## 2024-07-05 MED ORDER — HYDROXYZINE HCL 25 MG PO TABS
ORAL_TABLET | ORAL | 0 refills | Status: DC
Start: 2024-07-05 — End: 2024-09-27

## 2024-07-05 NOTE — Progress Notes (Signed)
 [facility] MD Virtual Progress Note   Patient Location: Home Provider Location: Home Office  I connect with patient by video and verified that I am speaking with correct person by using two identifiers. I discussed the limitations of evaluation and management by telemedicine and the availability of in person appointments. I also discussed with the patient that there may be a patient responsible charge related to this service. The patient expressed understanding and agreed to proceed.  [name] [id number] 63 y.o.  07/05/2024 11:34 AM  History of Present Illness:  Patient is evaluated by video session.  She is doing better after the surgery and does not require any more physical therapy.  She is able to lift some weight.  She had a good time with the grandkids and the family when they went to the mountains for a few days.  She still struggles sometimes with paranoia but denies any hallucination, suicidal thoughts or homicidal thoughts.  She denies any mania, agitation, impulsive behavior.  Sometimes she struggle with memory and remembering things.  She has appointment coming up in January with neurosurgeon for MRI for the follow-up of meningioma surgery..  Her appetite is fair.  Her weight is stable.  Her son lives close by and she is very happy that she is able to see the grandkids.  She was pleased because one of her grandkids stays with her last week.  Patient has 61 year old, 61 year old, 80-year-old and almost 76-year-old grandchildren.  She does not drive and her husband takes at the doctor's appointment.  She is compliant with risperidone , Klonopin  and Wellbutrin .  She has mild tremors but does not interfere in her daily activities.  She denies drinking.  Past Psychiatric History: H/O bipolar disorder and multiple inpatient treatment.  Last inpatient in September 2014 for suicidal thinking, hallucination as playing with guns and superficially cut her wrist with a needle.  H/O  mania and psychosis. Tried Zyprexa Seroquel but stopped due to weight gain.  Tegretol  caused increased paranoia confusion and required inpatient.       Outpatient Encounter Medications as of 07/05/2024  Medication Sig   acetaminophen  (TYLENOL ) 325 MG tablet Take 650 mg by mouth as needed.   amLODipine (NORVASC) 5 MG tablet Take 5 mg by mouth daily.   aspirin EC 81 MG tablet Take 81 mg by mouth daily. Swallow whole.   budesonide-formoterol (SYMBICORT) 80-4.5 MCG/ACT inhaler Inhale 2 puffs into the lungs 2 (two) times daily. (Patient not taking: Reported on 01/08/2024)   buPROPion  (WELLBUTRIN  XL) 300 MG 24 hr tablet Take 1 tablet (300 mg total) by mouth daily. TAKE ONE TABLET BY MOUTH EVERY MORINING FOR DEPRESSION   clonazePAM  (KLONOPIN ) 1 MG tablet Take 1 tablet (1 mg total) by mouth at bedtime.   cyanocobalamin (VITAMIN B12) 1000 MCG tablet Take 1,000 mcg by mouth daily.   dorzolamide-timolol (COSOPT) 2-0.5 % ophthalmic solution Place 1 drop into both eyes 2 (two) times daily.   hydrOXYzine  (ATARAX ) 25 MG tablet Take one tab as needed at bedtime.   Melatonin 5 MG CAPS Take 10 mg by mouth at bedtime.   metoprolol  succinate (TOPROL  XL) 25 MG 24 hr tablet Take 1 tablet (25 mg total) by mouth daily.   prochlorperazine  (COMPAZINE ) 10 MG tablet Take 1 tablet (10 mg total) by mouth 2 (two) times daily as needed for nausea or vomiting.   risperiDONE  (RISPERDAL ) 1 MG tablet Take 1 tablet (1 mg total) by mouth at bedtime.   No facility-administered encounter medications  on file as of 07/05/2024.    No results found for this or any previous visit (from the past 2160 hours).   Psychiatric Specialty Exam: Physical Exam  Review of Systems  Musculoskeletal:        Shoulder pain    Weight 203 lb (92.1 kg).There is no height or weight on file to calculate BMI.  General Appearance: Casual  Eye Contact:  Good  Speech:  Clear and Coherent  Volume:  Normal  Mood:  Euthymic  Affect:  Appropriate   Thought Process:  Goal Directed  Orientation:  Full (Time, Place, and Person)  Thought Content:  Logical  Suicidal Thoughts:  No  Homicidal Thoughts:  No  Memory:  Immediate;   Good Recent;   Good Remote;   Fair  Judgement:  Intact  Insight:  Present  Psychomotor Activity:  Tremor  Concentration:  Concentration: Fair and Attention Span: Fair  Recall:  [employer] of Knowledge:  Fair  Language:  Good  Akathisia:  No  Handed:  Right  AIMS (if indicated):     Assets:  Communication Skills Desire for Improvement Housing Social Support  ADL's:  Intact  Cognition:  WNL  Sleep:  fair       07/05/2024   11:47 AM  Depression screen PHQ 2/9  Decreased Interest 1  Down, Depressed, Hopeless 1  PHQ - 2 Score 2  Altered sleeping 1  Tired, decreased energy 0  Change in appetite 0  Feeling bad or failure about yourself  0  Trouble concentrating 0  Moving slowly or fidgety/restless 0  Suicidal thoughts 0  PHQ-9 Score 3  Difficult doing work/chores Not difficult at all    Assessment/Plan: Bipolar I disorder (HCC) - Plan: buPROPion  (WELLBUTRIN  XL) 300 MG 24 hr tablet, clonazePAM  (KLONOPIN ) 1 MG tablet, risperiDONE  (RISPERDAL ) 1 MG tablet  GAD (generalized anxiety disorder) - Plan: buPROPion  (WELLBUTRIN  XL) 300 MG 24 hr tablet  Mild cognitive disorder - Plan: buPROPion  (WELLBUTRIN  XL) 300 MG 24 hr tablet  Patient is 63 year old female with history of hypertension, migraine, temporal lobe lesion, tremors, history of meningioma tumor, anxiety, bipolar disorder and mild cognitive impairment.  Patient is stable on current medication.  She is going to have follow-up in January for neurosurgeon.  She does not want to change the medication as she feel things are stable.  Occasionally she has tremors but manageable.  Continue Klonopin  1 mg at bedtime, Wellbutrin  XL 300 mg daily, hydroxyzine  25 mg at bedtime and risperidone  0.5 mg at bedtime.  Recommended to call us  back if she has any question  or any concern.  Follow-up in 3 months.    Follow Up Instructions:     I discussed the assessment and treatment plan with the patient. The patient was provided an opportunity to ask questions and all were answered. The patient agreed with the plan and demonstrated an understanding of the instructions.   The patient was advised to call back or seek an in-person evaluation if the symptoms worsen or if the condition fails to improve as anticipated.    Collaboration of Care: Other provider involved in patient's care AEB notes are available in epic to review  Patient/Guardian was advised Release of Information must be obtained prior to any record release in order to collaborate their care with an outside provider. Patient/Guardian was advised if they have not already done so to contact the registration department to sign all necessary forms in order for us  to release information regarding their  care.   Consent: Patient/Guardian gives verbal consent for treatment and assignment of benefits for services provided during this visit. Patient/Guardian expressed understanding and agreed to proceed.     Total encounter time 20 minutes which includes face-to-face time, chart reviewed, care coordination, order entry and documentation during this encounter.   Note: This document was prepared by [employer] voice dictation technology and any errors that results from this process are unintentional.    Leni ONEIDA Client, MD 07/05/2024

## 2024-08-27 ENCOUNTER — Emergency Department (HOSPITAL_COMMUNITY)
Admission: EM | Admit: 2024-08-27 | Discharge: 2024-08-27 | Disposition: A | Discharge status: Home / Self Care | Attending: Emergency Medicine | Admitting: Emergency Medicine

## 2024-08-27 ENCOUNTER — Emergency Department (HOSPITAL_COMMUNITY)

## 2024-08-27 ENCOUNTER — Other Ambulatory Visit: Payer: Self-pay

## 2024-08-27 ENCOUNTER — Encounter (HOSPITAL_COMMUNITY): Payer: Self-pay

## 2024-08-27 DIAGNOSIS — R1011 Right upper quadrant pain: Secondary | ICD-10-CM | POA: Diagnosis present

## 2024-08-27 DIAGNOSIS — Z7982 Long term (current) use of aspirin: Secondary | ICD-10-CM | POA: Insufficient documentation

## 2024-08-27 DIAGNOSIS — R7989 Other specified abnormal findings of blood chemistry: Secondary | ICD-10-CM | POA: Diagnosis not present

## 2024-08-27 LAB — CBC
HCT: 43.8 % (ref 36.0–46.0)
Hemoglobin: 15 g/dL (ref 12.0–15.0)
MCH: 30.5 pg (ref 26.0–34.0)
MCHC: 34.2 g/dL (ref 30.0–36.0)
MCV: 89 fL (ref 80.0–100.0)
Platelets: 300 K/uL (ref 150–400)
RBC: 4.92 MIL/uL (ref 3.87–5.11)
RDW: 12.4 % (ref 11.5–15.5)
WBC: 9.5 K/uL (ref 4.0–10.5)
nRBC: 0 % (ref 0.0–0.2)

## 2024-08-27 LAB — URINALYSIS, ROUTINE W REFLEX MICROSCOPIC
Bilirubin Urine: NEGATIVE
Glucose, UA: NEGATIVE mg/dL
Hgb urine dipstick: NEGATIVE
Ketones, ur: NEGATIVE mg/dL
Nitrite: NEGATIVE
Protein, ur: NEGATIVE mg/dL
Specific Gravity, Urine: 1.014 (ref 1.005–1.030)
pH: 5 (ref 5.0–8.0)

## 2024-08-27 LAB — COMPREHENSIVE METABOLIC PANEL WITH GFR
ALT: 18 U/L (ref 0–44)
AST: 23 U/L (ref 15–41)
Albumin: 3.9 g/dL (ref 3.5–5.0)
Alkaline Phosphatase: 100 U/L (ref 38–126)
Anion gap: 17 — ABNORMAL HIGH (ref 5–15)
BUN: 17 mg/dL (ref 8–23)
CO2: 21 mmol/L — ABNORMAL LOW (ref 22–32)
Calcium: 9.5 mg/dL (ref 8.9–10.3)
Chloride: 100 mmol/L (ref 98–111)
Creatinine, Ser: 1.41 mg/dL — ABNORMAL HIGH (ref 0.44–1.00)
GFR, Estimated: 42 mL/min — ABNORMAL LOW (ref >60)
Glucose, Bld: 118 mg/dL — ABNORMAL HIGH (ref 70–99)
Potassium: 3.2 mmol/L — ABNORMAL LOW (ref 3.5–5.1)
Sodium: 138 mmol/L (ref 135–145)
Total Bilirubin: 0.8 mg/dL (ref 0.0–1.2)
Total Protein: 6.9 g/dL (ref 6.5–8.1)

## 2024-08-27 LAB — LIPASE, BLOOD: Lipase: 35 U/L (ref 11–51)

## 2024-08-27 MED ORDER — PANTOPRAZOLE SODIUM 20 MG PO TBEC: 20.0000 mg | DELAYED_RELEASE_TABLET | Freq: Every day | ORAL | 0 refills | Status: DC

## 2024-08-27 MED ORDER — IOHEXOL 300 MG/ML  SOLN
80.0000 mL | Freq: Once | INTRAMUSCULAR | Status: AC | PRN
  Administered 2024-08-27: 80 mL via INTRAVENOUS

## 2024-08-27 MED ORDER — ONDANSETRON HCL 4 MG/2ML IJ SOLN
4.0000 mg | Freq: Once | INTRAMUSCULAR | Status: AC
  Administered 2024-08-27: 4 mg via INTRAVENOUS
  Filled 2024-08-27: qty 2

## 2024-08-27 MED ORDER — FENTANYL CITRATE (PF) 100 MCG/2ML IJ SOLN
50.0000 ug | Freq: Once | INTRAMUSCULAR | Status: AC
  Administered 2024-08-27: 50 ug via INTRAVENOUS
  Filled 2024-08-27: qty 2

## 2024-08-27 NOTE — ED Triage Notes (Signed)
 Pt stated that she is having pain around her RUQ. Pt had ultrasound two week ago that showed her gallbladder and liver were acting up.

## 2024-08-27 NOTE — Discharge Instructions (Addendum)
 You were seen in the emergency department for right upper quadrant abdominal pain.  You had lab work CAT scan urinalysis that did not show an obvious explanation for your symptoms.  We are starting you on an acid medication.  It will be important for her to follow-up with your primary care doctor and they may need to refer you onto gastroenterology.  At this time we do not see any signs of acute gallbladder attack.  Return to the emergency department if any high fevers or worsening symptoms

## 2024-08-27 NOTE — ED Provider Notes (Signed)
 [place] EMERGENCY DEPARTMENT AT [facility] Provider Note   CSN: [id number] Arrival date & time: 08/27/24  1739     Patient presents with: Abdominal Pain   [name] is a 63 y.o. female.  She is here with a complaint of right upper quadrant abdominal pain radiating through to her back associated with nausea that is been going on for a few weeks.  Had an outpatient ultrasound which showed fatty liver, gallbladder polyp.  Pain worsened today so she came here for further evaluation.  She has a copy of her ultrasound report whether that was done in [place].  No fever.  Feels short of breath.  Has had intermittent constipation and diarrhea.  Also feels abdominal bloating.   The history is provided by the patient and the spouse.  Abdominal Pain Pain location:  RUQ Pain quality: aching   Pain radiates to:  Back Pain severity:  Moderate Onset quality:  Gradual Duration:  2 weeks Timing:  Intermittent Progression:  Worsening Chronicity:  New Context: not trauma   Relieved by:  None tried Associated symptoms: constipation, diarrhea, nausea and shortness of breath   Associated symptoms: no chest pain, no cough, no dysuria, no fever and no hematemesis        Prior to Admission medications   Medication Sig Start Date End Date Taking? Authorizing Provider  acetaminophen  (TYLENOL ) 325 MG tablet Take 650 mg by mouth as needed.    [provider]  amLODipine (NORVASC) 5 MG tablet Take 5 mg by mouth daily. 02/16/20   [provider]  aspirin EC 81 MG tablet Take 81 mg by mouth daily. Swallow whole.    [provider]  budesonide-formoterol (SYMBICORT) 80-4.5 MCG/ACT inhaler Inhale 2 puffs into the lungs 2 (two) times daily. Patient not taking: Reported on 01/08/2024    [provider]  buPROPion  (WELLBUTRIN  XL) 300 MG 24 hr tablet Take 1 tablet (300 mg total) by mouth daily. TAKE ONE TABLET BY MOUTH EVERY MORINING FOR DEPRESSION 07/05/24    Arfeen, Leni DASEN, MD  clonazePAM  (KLONOPIN ) 1 MG tablet Take 1 tablet (1 mg total) by mouth at bedtime. 07/05/24   Arfeen, Syed T, MD  cyanocobalamin (VITAMIN B12) 1000 MCG tablet Take 1,000 mcg by mouth daily.    [provider]  dorzolamide-timolol (COSOPT) 2-0.5 % ophthalmic solution Place 1 drop into both eyes 2 (two) times daily. 12/22/23   [provider]  hydrOXYzine  (ATARAX ) 25 MG tablet Take one tab as needed at bedtime. 07/05/24   Arfeen, Leni DASEN, MD  Melatonin 5 MG CAPS Take 10 mg by mouth at bedtime.    [provider]  metoprolol  succinate (TOPROL  XL) 25 MG 24 hr tablet Take 1 tablet (25 mg total) by mouth daily. 11/05/23   Mallipeddi, Vishnu P, MD  prochlorperazine  (COMPAZINE ) 10 MG tablet Take 1 tablet (10 mg total) by mouth 2 (two) times daily as needed for nausea or vomiting. 04/19/21   Patsey Lot, MD  risperiDONE  (RISPERDAL ) 1 MG tablet Take 1 tablet (1 mg total) by mouth at bedtime. 07/05/24   Arfeen, Leni DASEN, MD    Allergies: Gabapentin, Oxcarbazepine, Tegretol  [carbamazepine ], Codeine, Divalproex sodium, Erythromycin, Phenytoin sodium extended, and Phenytoin sodium extended    Review of Systems  Constitutional:  Negative for fever.  Respiratory:  Positive for shortness of breath. Negative for cough.   Cardiovascular:  Negative for chest pain.  Gastrointestinal:  Positive for abdominal pain, constipation, diarrhea and nausea. Negative for hematemesis.  Genitourinary:  Negative for dysuria.    Updated Vital Signs BP 118/88 (BP Location: Right Arm)   Pulse 97   Temp 98.5 F (36.9 C) (Oral)   Resp 18   Ht 5' 2 (1.575 m)   Wt 87.1 kg   SpO2 97%   BMI 35.12 kg/m   Physical Exam Vitals and nursing note reviewed.  Constitutional:      General: She is not in acute distress.    Appearance: Normal appearance. She is well-developed.  HENT:     Head: Normocephalic and atraumatic.  Eyes:     Conjunctiva/sclera: Conjunctivae normal.   Cardiovascular:     Rate and Rhythm: Normal rate and regular rhythm.     Heart sounds: No murmur heard. Pulmonary:     Effort: Pulmonary effort is normal. No respiratory distress.     Breath sounds: Normal breath sounds. No stridor. No wheezing.  Abdominal:     Palpations: Abdomen is soft.     Tenderness: There is abdominal tenderness in the right upper quadrant. There is no guarding or rebound. Negative signs include Murphy's sign.  Musculoskeletal:        General: No tenderness or deformity. Normal range of motion.     Cervical back: Neck supple.  Skin:    General: Skin is warm and dry.  Neurological:     General: No focal deficit present.     Mental Status: She is alert.     GCS: GCS eye subscore is 4. GCS verbal subscore is 5. GCS motor subscore is 6.     Motor: No weakness.     (all labs ordered are listed, but only abnormal results are displayed) Labs Reviewed  COMPREHENSIVE METABOLIC PANEL WITH GFR - Abnormal; Notable for the following components:      Result Value   Potassium 3.2 (*)    CO2 21 (*)    Glucose, Bld 118 (*)    Creatinine, Ser 1.41 (*)    GFR, Estimated 42 (*)    Anion gap 17 (*)    All other components within normal limits  URINALYSIS, ROUTINE W REFLEX MICROSCOPIC - Abnormal; Notable for the following components:   APPearance HAZY (*)    Leukocytes,Ua MODERATE (*)    Bacteria, UA RARE (*)    Non Squamous Epithelial 0-5 (*)    All other components within normal limits  LIPASE, BLOOD  CBC    EKG: None  Radiology: CT ABDOMEN PELVIS W CONTRAST Result Date: 08/27/2024 CLINICAL DATA:  Right upper quadrant abdominal pain EXAM: CT ABDOMEN AND PELVIS WITH CONTRAST TECHNIQUE: Multidetector CT imaging of the abdomen and pelvis was performed using the standard protocol following bolus administration of intravenous contrast. RADIATION DOSE REDUCTION: This exam was performed according to the departmental dose-optimization program which includes automated  exposure control, adjustment of the mA and/or kV according to patient size and/or use of iterative reconstruction technique. CONTRAST:  80mL OMNIPAQUE  IOHEXOL  300 MG/ML  SOLN COMPARISON:  01/11/2011 FINDINGS: Lower chest: No acute pleural or parenchymal lung disease. Hepatobiliary: Decreased liver attenuation consistent with hepatic steatosis. Indeterminate 1.6 cm hypodensity image 25/2 likely reflects a small cyst or hemangioma. Gallbladder is unremarkable. No biliary duct dilation. Pancreas: Unremarkable. No pancreatic ductal dilatation or surrounding inflammatory changes. Spleen: Normal in size without focal abnormality. Adrenals/Urinary Tract: Punctate less than 2 mm nonobstructing calculi lower pole right kidney. No left-sided calculi. No obstructive uropathy within either kidney. The adrenals and bladder are unremarkable. Stomach/Bowel: No bowel obstruction or ileus. Normal  appendix right lower quadrant. Scattered colonic diverticulosis without diverticulitis. No bowel wall thickening or inflammatory change. Vascular/Lymphatic: Aortic atherosclerosis. No enlarged abdominal or pelvic lymph nodes. Reproductive: Status post hysterectomy. No adnexal masses. Other: No free fluid or free intraperitoneal gas. No abdominal wall hernia. Musculoskeletal: No acute or destructive bony abnormalities. Reconstructed images demonstrate no additional findings. IMPRESSION: 1. Decreased liver attenuation consistent with hepatic steatosis. 2. Punctate less than 2 mm nonobstructing right renal calculi. 3. Scattered colonic diverticulosis without diverticulitis. 4.  Aortic Atherosclerosis (ICD10-I70.0). Electronically Signed   By: Ozell Daring M.D.   On: 08/27/2024 19:51   DG Chest Port 1 View Result Date: 08/27/2024 CLINICAL DATA:  Right thoracoabdominal pain EXAM: PORTABLE CHEST 1 VIEW COMPARISON:  06/29/2023 FINDINGS: The lungs appear clear. Cardiac and mediastinal contours normal. No blunting of the costophrenic angles. No  significant bony findings. IMPRESSION: 1. No active cardiopulmonary disease is radiographically apparent. Electronically Signed   By: Ryan Salvage M.D.   On: 08/27/2024 18:52     Procedures   Medications Ordered in the ED  fentaNYL  (SUBLIMAZE ) injection 50 mcg (50 mcg Intravenous Given 08/27/24 1904)  ondansetron  (ZOFRAN ) injection 4 mg (4 mg Intravenous Given 08/27/24 1902)  iohexol  (OMNIPAQUE ) 300 MG/ML solution 80 mL (80 mLs Intravenous Contrast Given 08/27/24 1927)    Clinical Course as of 08/27/24 2010  Fri Aug 27, 2024  1848 Chest x-ray interpreted by me as no acute infiltrate.  Awaiting radiology reading. [MB]  2010 Reviewed results of CT and lab work with patient.  She is not on a PPI so we will start her on 1.  Recommended close follow-up with PCP as she may require further outpatient testing. [MB]    Clinical Course User Index [MB] Towana Ozell BROCKS, MD                                 Medical Decision Making Amount and/or Complexity of Data Reviewed Labs: ordered. Radiology: ordered.  Risk Prescription drug management.   This patient complains of right upper quadrant abdominal pain; this involves an extensive number of treatment Options and is a complaint that carries with it a high risk of complications and morbidity. The differential includes Biliary colic, peptic ulcer disease, diverticulitis, colitis, pneumonia  I ordered, reviewed and interpreted labs, which included  CBC normal, chemistries with mildly low potassium elevated creatinine, LFTs normal, urinalysis possible infection although likely contaminated sample and no symptoms I ordered medication IV pain medicine nausea medicine and reviewed PMP when indicated. I ordered imaging studies which included CT abdomen and pelvis and I independently    visualized and interpreted imaging which showed fatty liver nonobstructing kidney stone Additional history obtained from patient's husband Previous records obtained  and reviewed in epic including a paper copy of her ultrasound report Cardiac monitoring reviewed, sinus rhythm Social determinants considered, no specific barriers Critical Interventions: None  After the interventions stated above, I reevaluated the patient and found patient to have a benign abdominal exam Admission and further testing considered, recommended close follow-up with her PCP.  Will start her on a PPI.  Return instructions discussed.      Final diagnoses:  RUQ abdominal pain    ED Discharge Orders          Ordered    pantoprazole  (PROTONIX ) 20 MG tablet  Daily        08/27/24 2010  Towana Ozell BROCKS, MD 08/28/24 [phone]

## 2024-09-01 ENCOUNTER — Encounter (INDEPENDENT_AMBULATORY_CARE_PROVIDER_SITE_OTHER): Payer: Self-pay

## 2024-09-01 ENCOUNTER — Encounter (INDEPENDENT_AMBULATORY_CARE_PROVIDER_SITE_OTHER): Payer: Self-pay | Admitting: Gastroenterology

## 2024-09-01 ENCOUNTER — Ambulatory Visit (INDEPENDENT_AMBULATORY_CARE_PROVIDER_SITE_OTHER): Admitting: Gastroenterology

## 2024-09-01 ENCOUNTER — Telehealth (INDEPENDENT_AMBULATORY_CARE_PROVIDER_SITE_OTHER): Payer: Self-pay

## 2024-09-01 VITALS — BP 125/66 | HR 87 | Temp 97.8°F | Ht 62.0 in | Wt 196.0 lb

## 2024-09-01 DIAGNOSIS — R6881 Early satiety: Secondary | ICD-10-CM

## 2024-09-01 DIAGNOSIS — R1011 Right upper quadrant pain: Secondary | ICD-10-CM

## 2024-09-01 DIAGNOSIS — R1013 Epigastric pain: Secondary | ICD-10-CM | POA: Insufficient documentation

## 2024-09-01 DIAGNOSIS — R634 Abnormal weight loss: Secondary | ICD-10-CM | POA: Diagnosis not present

## 2024-09-01 DIAGNOSIS — K828 Other specified diseases of gallbladder: Secondary | ICD-10-CM | POA: Diagnosis not present

## 2024-09-01 DIAGNOSIS — R1010 Upper abdominal pain, unspecified: Secondary | ICD-10-CM

## 2024-09-01 MED ORDER — DICYCLOMINE HCL 10 MG PO CAPS: 10.0000 mg | ORAL_CAPSULE | Freq: Two times a day (BID) | ORAL | 1 refills | Status: DC | PRN

## 2024-09-01 NOTE — H&P (View-Only) (Signed)
 Toribio Fortune, M.D. Gastroenterology & Hepatology [facility] [address] [place], [place] [address]  Primary Care Physician: Diedra Senior, MD [address] [place] [address]  I will communicate my assessment and recommendations to the referring MD via EMR.  Problems: Right upper quadrant and epigastric abdominal pain  History of Present Illness: [name] is a 63 y.o. female with past medical history of anxiety, bipolar disorder, meningioma, depression, hypertension, migraines, who presents for evaluation of right upper quadrant and epigastric abdominal pain.  Patient reports that she has presented RUQ abdominal pain for the last 4 months. States that the pain is a dull ache which is intermittent but can worsen intermittently. States that if she bends over or puts pressure, she may have more abdominal pain. Pain can be up to 8/10 in severity. Pain may migrate across her upper abdomen. She also feels bloated. She also has had nausea intermittently but not vomiting.  Patient reports that she has lost 14 lb in the last 5 weeks. She feels that after she has a meal, food is not going down from her epigastric area. Due to this she has been eating less, possibly once a day. She has felt frequent fatigue.  The patient denies having any  fever, chills, hematochezia, melena,  diarrhea, jaundice, pruritus.  As part of the outpatient evaluation of her symptoms, the patient underwent an ultrasound of the abdomen on 08/11/2024 which showed hepatic steatosis.  There was a 2 mm gallbladder polyp.  Due to worsening symptoms, the patient was seen in the ER at [facility] on 08/27/2024. Labs showed potassium 3.2, creatinine 1.41, BUN 17, AST 23, ALT 18, lipase 35, total bili 0.8, normal CBC.  Chest x-ray was normal. CT of the abdomen pelvis with contrast showed hepatic steatosis and diverticulosis.  Patient was advised to follow closely with  gastroenterology and was advised to start pantoprazole  20 mg daily.  Patient started Fosamax a month ago.  No other new medications.  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    Past Surgical History: Past Surgical History:  Procedure Laterality Date   ABDOMINAL HYSTERECTOMY     BRAIN SURGERY     breast tumor removal     COLONOSCOPY WITH PROPOFOL  N/A 09/03/2023   Procedure: COLONOSCOPY WITH PROPOFOL ;  Surgeon: Fortune Angelia Toribio, MD;  Location: [facility];  Service: Gastroenterology;  Laterality: N/A;  7:30am;asa 1   INCONTINENCE SURGERY     INTERSTIM IMPLANT PLACEMENT     KNEE SURGERY     right x 2   PLANTAR FASCIECTOMY Left 03/05/16   POLYPECTOMY  09/03/2023   Procedure: POLYPECTOMY;  Surgeon: Fortune Angelia Toribio, MD;  Location: [facility];  Service: Gastroenterology;;   TUBAL LIGATION      Family History: Family History  Problem Relation Age of Onset   Mental illness Mother    Breast cancer Mother    Alcohol abuse Father    Diabetes Father    Mental retardation Father    Drug abuse Brother    Dementia Neg Hx  ADD / ADHD Neg Hx    Anxiety disorder Neg Hx    Bipolar disorder Neg Hx    Depression Neg Hx    OCD Neg Hx    Paranoid behavior Neg Hx    Schizophrenia Neg Hx    Seizures Neg Hx    Sexual abuse Neg Hx    Physical abuse Neg Hx    Suicidality Neg Hx     Social History: Social History   Tobacco Use  Smoking Status Never  Smokeless Tobacco Never   Social History   Substance and Sexual Activity  Alcohol Use No   Alcohol/week: 0.0 standard drinks of alcohol   Social History   Substance and Sexual  Activity  Drug Use No    Allergies: Allergies  Allergen Reactions   Gabapentin Anaphylaxis   Oxcarbazepine Anaphylaxis   Tegretol  [Carbamazepine ] Nausea And Vomiting   Codeine Nausea Only   Divalproex Sodium Swelling   Erythromycin Nausea And Vomiting   Phenytoin Sodium Extended Swelling   Phenytoin Sodium Extended Rash    Medications: Current Outpatient Medications  Medication Sig Dispense Refill   acetaminophen  (TYLENOL ) 325 MG tablet Take 650 mg by mouth as needed.     alendronate (FOSAMAX) 70 MG tablet Take 70 mg by mouth once a week. Take with a full glass of water on an empty stomach.     amLODipine (NORVASC) 5 MG tablet Take 5 mg by mouth daily.     buPROPion  (WELLBUTRIN  XL) 300 MG 24 hr tablet Take 1 tablet (300 mg total) by mouth daily. TAKE ONE TABLET BY MOUTH EVERY MORINING FOR DEPRESSION 90 tablet 0   clonazePAM  (KLONOPIN ) 1 MG tablet Take 1 tablet (1 mg total) by mouth at bedtime. 90 tablet 0   cyanocobalamin (VITAMIN B12) 1000 MCG tablet Take 1,000 mcg by mouth daily.     hydrOXYzine  (ATARAX ) 25 MG tablet Take one tab as needed at bedtime. 90 tablet 0   Melatonin 5 MG CAPS Take 10 mg by mouth at bedtime.     metoprolol  succinate (TOPROL  XL) 25 MG 24 hr tablet Take 1 tablet (25 mg total) by mouth daily. 90 tablet 3   potassium chloride  SA (KLOR-CON  M) 20 MEQ tablet Take 20 mEq by mouth daily.     prochlorperazine  (COMPAZINE ) 10 MG tablet Take 1 tablet (10 mg total) by mouth 2 (two) times daily as needed for nausea or vomiting. 10 tablet 0   risperiDONE  (RISPERDAL ) 1 MG tablet Take 1 tablet (1 mg total) by mouth at bedtime. 90 tablet 0   pantoprazole  (PROTONIX ) 20 MG tablet Take 1 tablet (20 mg total) by mouth daily. (Patient not taking: Reported on 09/01/2024) 30 tablet 0   No current facility-administered medications for this visit.    Review of Systems: GENERAL: negative for malaise, night sweats HEENT: No changes in hearing or vision, no nose bleeds or other  nasal problems. NECK: Negative for lumps, goiter, pain and significant neck swelling RESPIRATORY: Negative for cough, wheezing CARDIOVASCULAR: Negative for chest pain, leg swelling, palpitations, orthopnea GI: SEE HPI MUSCULOSKELETAL: Negative for joint pain or swelling, back pain, and muscle pain. SKIN: Negative for lesions, rash PSYCH: Negative for sleep disturbance, mood disorder and recent psychosocial stressors. HEMATOLOGY Negative for prolonged bleeding, bruising easily, and swollen nodes. ENDOCRINE: Negative for cold or heat intolerance, polyuria, polydipsia and goiter. NEURO: negative for tremor, gait imbalance, syncope and seizures. The remainder of the review of systems is noncontributory.   Physical Exam: BP 125/66 (BP Location:  Left Arm, Patient Position: Sitting, Cuff Size: Large)   Pulse 87   Temp 97.8 F (36.6 C) (Temporal)   Ht 5' 2 (1.575 m)   Wt 196 lb (88.9 kg)   BMI 35.85 kg/m  GENERAL: The patient is AO x3, in no acute distress. HEENT: Head is normocephalic and atraumatic. EOMI are intact. Mouth is well hydrated and without lesions. NECK: Supple. No masses LUNGS: Clear to auscultation. No presence of rhonchi/wheezing/rales. Adequate chest expansion HEART: RRR, normal s1 and s2. ABDOMEN: tender upon palpation of the epigastric and right subcostal area, no guarding, no peritoneal signs, and nondistended. BS +. No masses. EXTREMITIES: Without any cyanosis, clubbing, rash, lesions or edema. NEUROLOGIC: AOx3, no focal motor deficit. SKIN: no jaundice, no rashes  Imaging/Labs: as above  I personally reviewed and interpreted the available labs, imaging and endoscopic files.  Impression and Plan: [name] is a 63 y.o. female with past medical history of anxiety, bipolar disorder, meningioma, depression, hypertension, migraines, who presents for evaluation of right upper quadrant and epigastric abdominal pain.  Patient has presented significant persistent  episodes of abdominal pain in the epigastric and right upper quadrant area, which have been concerning for more recent early satiety and associated weight loss.  We discussed the need to evaluate this further with an upper endoscopy, which the patient is agreeable to proceed with.  Will also check celiac disease panel at this moment.  Depending on findings during endoscopy, we may need to proceed with a gastric emptying study.  Reassuringly, her most recent cross-sectional abdominal imaging was unremarkable.  I will also send a prescription for Bentyl  as needed for abdominal pain.  Notably, her last ultrasound showed presence of a gallbladder polyp, which will need surveillance with repeat ultrasound in 6 months.  -Schedule EGD -Check celiac disease panel -Start Bentyl  1 tablet q12h as needed for abdominal pain -If negative endoscopic investigations, we will proceed with gastric emptying study. -Will need to schedule repeat ultrasound of the right upper quadrant in 6 months for surveillance of gallbladder polyp  All questions were answered.      Toribio Fortune, MD Gastroenterology and Hepatology [facility];

## 2024-09-01 NOTE — Telephone Encounter (Signed)
 Prior Authorization through [employer]: Approved Authorization [id number]  Tracking [id number]

## 2024-09-01 NOTE — Progress Notes (Signed)
 [name], M.D. Gastroenterology & Hepatology [facility] [address] [place], [place] [address]  Primary Care Physician: Diedra Senior, MD [address] [place] [address]  I will communicate my assessment and recommendations to the referring MD via EMR.  Problems: Right upper quadrant and epigastric abdominal pain  History of Present Illness: [name] is a 63 y.o. female with past medical history of anxiety, bipolar disorder, meningioma, depression, hypertension, migraines, who presents for evaluation of right upper quadrant and epigastric abdominal pain.  Patient reports that she has presented RUQ abdominal pain for the last 4 months. States that the pain is a dull ache which is intermittent but can worsen intermittently. States that if she bends over or puts pressure, she may have more abdominal pain. Pain can be up to 8/10 in severity. Pain may migrate across her upper abdomen. She also feels bloated. She also has had nausea intermittently but not vomiting.  Patient reports that she has lost 14 lb in the last 5 weeks. She feels that after she has a meal, food is not going down from her epigastric area. Due to this she has been eating less, possibly once a day. She has felt frequent fatigue.  The patient denies having any  fever, chills, hematochezia, melena,  diarrhea, jaundice, pruritus.  As part of the outpatient evaluation of her symptoms, the patient underwent an ultrasound of the abdomen on 08/11/2024 which showed hepatic steatosis.  There was a 2 mm gallbladder polyp.  Due to worsening symptoms, the patient was seen in the ER at [facility] on 08/27/2024. Labs showed potassium 3.2, creatinine 1.41, BUN 17, AST 23, ALT 18, lipase 35, total bili 0.8, normal CBC.  Chest x-ray was normal. CT of the abdomen pelvis with contrast showed hepatic steatosis and diverticulosis.  Patient was advised to follow closely with  gastroenterology and was advised to start pantoprazole  20 mg daily.  Patient started Fosamax a month ago.  No other new medications.  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    Past Surgical History: Past Surgical History:  Procedure Laterality Date   ABDOMINAL HYSTERECTOMY     BRAIN SURGERY     breast tumor removal     COLONOSCOPY WITH PROPOFOL  N/A 09/03/2023   Procedure: COLONOSCOPY WITH PROPOFOL ;  Surgeon: [name] Angelia Toribio, MD;  Location: [facility];  Service: Gastroenterology;  Laterality: N/A;  7:30am;asa 1   INCONTINENCE SURGERY     INTERSTIM IMPLANT PLACEMENT     KNEE SURGERY     right x 2   PLANTAR FASCIECTOMY Left 03/05/16   POLYPECTOMY  09/03/2023   Procedure: POLYPECTOMY;  Surgeon: [name] Angelia Toribio, MD;  Location: [facility];  Service: Gastroenterology;;   TUBAL LIGATION      Family History: Family History  Problem Relation Age of Onset   Mental illness Mother    Breast cancer Mother    Alcohol abuse Father    Diabetes Father    Mental retardation Father    Drug abuse Brother    Dementia Neg Hx  ADD / ADHD Neg Hx    Anxiety disorder Neg Hx    Bipolar disorder Neg Hx    Depression Neg Hx    OCD Neg Hx    Paranoid behavior Neg Hx    Schizophrenia Neg Hx    Seizures Neg Hx    Sexual abuse Neg Hx    Physical abuse Neg Hx    Suicidality Neg Hx     Social History: Social History   Tobacco Use  Smoking Status Never  Smokeless Tobacco Never   Social History   Substance and Sexual Activity  Alcohol Use No   Alcohol/week: 0.0 standard drinks of alcohol   Social History   Substance and Sexual  Activity  Drug Use No    Allergies: Allergies  Allergen Reactions   Gabapentin Anaphylaxis   Oxcarbazepine Anaphylaxis   Tegretol  [Carbamazepine ] Nausea And Vomiting   Codeine Nausea Only   Divalproex Sodium Swelling   Erythromycin Nausea And Vomiting   Phenytoin Sodium Extended Swelling   Phenytoin Sodium Extended Rash    Medications: Current Outpatient Medications  Medication Sig Dispense Refill   acetaminophen  (TYLENOL ) 325 MG tablet Take 650 mg by mouth as needed.     alendronate (FOSAMAX) 70 MG tablet Take 70 mg by mouth once a week. Take with a full glass of water on an empty stomach.     amLODipine (NORVASC) 5 MG tablet Take 5 mg by mouth daily.     buPROPion  (WELLBUTRIN  XL) 300 MG 24 hr tablet Take 1 tablet (300 mg total) by mouth daily. TAKE ONE TABLET BY MOUTH EVERY MORINING FOR DEPRESSION 90 tablet 0   clonazePAM  (KLONOPIN ) 1 MG tablet Take 1 tablet (1 mg total) by mouth at bedtime. 90 tablet 0   cyanocobalamin (VITAMIN B12) 1000 MCG tablet Take 1,000 mcg by mouth daily.     hydrOXYzine  (ATARAX ) 25 MG tablet Take one tab as needed at bedtime. 90 tablet 0   Melatonin 5 MG CAPS Take 10 mg by mouth at bedtime.     metoprolol  succinate (TOPROL  XL) 25 MG 24 hr tablet Take 1 tablet (25 mg total) by mouth daily. 90 tablet 3   potassium chloride  SA (KLOR-CON  M) 20 MEQ tablet Take 20 mEq by mouth daily.     prochlorperazine  (COMPAZINE ) 10 MG tablet Take 1 tablet (10 mg total) by mouth 2 (two) times daily as needed for nausea or vomiting. 10 tablet 0   risperiDONE  (RISPERDAL ) 1 MG tablet Take 1 tablet (1 mg total) by mouth at bedtime. 90 tablet 0   pantoprazole  (PROTONIX ) 20 MG tablet Take 1 tablet (20 mg total) by mouth daily. (Patient not taking: Reported on 09/01/2024) 30 tablet 0   No current facility-administered medications for this visit.    Review of Systems: GENERAL: negative for malaise, night sweats HEENT: No changes in hearing or vision, no nose bleeds or other  nasal problems. NECK: Negative for lumps, goiter, pain and significant neck swelling RESPIRATORY: Negative for cough, wheezing CARDIOVASCULAR: Negative for chest pain, leg swelling, palpitations, orthopnea GI: SEE HPI MUSCULOSKELETAL: Negative for joint pain or swelling, back pain, and muscle pain. SKIN: Negative for lesions, rash PSYCH: Negative for sleep disturbance, mood disorder and recent psychosocial stressors. HEMATOLOGY Negative for prolonged bleeding, bruising easily, and swollen nodes. ENDOCRINE: Negative for cold or heat intolerance, polyuria, polydipsia and goiter. NEURO: negative for tremor, gait imbalance, syncope and seizures. The remainder of the review of systems is noncontributory.   Physical Exam: BP 125/66 (BP Location:  Left Arm, Patient Position: Sitting, Cuff Size: Large)   Pulse 87   Temp 97.8 F (36.6 C) (Temporal)   Ht 5' 2 (1.575 m)   Wt 196 lb (88.9 kg)   BMI 35.85 kg/m  GENERAL: The patient is AO x3, in no acute distress. HEENT: Head is normocephalic and atraumatic. EOMI are intact. Mouth is well hydrated and without lesions. NECK: Supple. No masses LUNGS: Clear to auscultation. No presence of rhonchi/wheezing/rales. Adequate chest expansion HEART: RRR, normal s1 and s2. ABDOMEN: tender upon palpation of the epigastric and right subcostal area, no guarding, no peritoneal signs, and nondistended. BS +. No masses. EXTREMITIES: Without any cyanosis, clubbing, rash, lesions or edema. NEUROLOGIC: AOx3, no focal motor deficit. SKIN: no jaundice, no rashes  Imaging/Labs: as above  I personally reviewed and interpreted the available labs, imaging and endoscopic files.  Impression and Plan: RANDE DARIO is a 63 y.o. female with past medical history of anxiety, bipolar disorder, meningioma, depression, hypertension, migraines, who presents for evaluation of right upper quadrant and epigastric abdominal pain.  Patient has presented significant persistent  episodes of abdominal pain in the epigastric and right upper quadrant area, which have been concerning for more recent early satiety and associated weight loss.  We discussed the need to evaluate this further with an upper endoscopy, which the patient is agreeable to proceed with.  Will also check celiac disease panel at this moment.  Depending on findings during endoscopy, we may need to proceed with a gastric emptying study.  Reassuringly, her most recent cross-sectional abdominal imaging was unremarkable.  I will also send a prescription for Bentyl  as needed for abdominal pain.  Notably, her last ultrasound showed presence of a gallbladder polyp, which will need surveillance with repeat ultrasound in 6 months.  -Schedule EGD -Check celiac disease panel -Start Bentyl  1 tablet q12h as needed for abdominal pain -If negative endoscopic investigations, we will proceed with gastric emptying study. -Will need to schedule repeat ultrasound of the right upper quadrant in 6 months for surveillance of gallbladder polyp  All questions were answered.      Sue Fortune, MD Gastroenterology and Hepatology [facility];

## 2024-09-01 NOTE — Patient Instructions (Signed)
 Schedule EGD Perform blood workup Start Bentyl  1 tablet q12h as needed for abdominal pain If negative endoscopic investigations, we will proceed with gastric emptying study.

## 2024-09-02 ENCOUNTER — Ambulatory Visit (INDEPENDENT_AMBULATORY_CARE_PROVIDER_SITE_OTHER): Payer: Self-pay | Admitting: Gastroenterology

## 2024-09-02 LAB — CELIAC DISEASE PANEL
(tTG) Ab, IgA: <1 U/mL
(tTG) Ab, IgG: <1 U/mL
Gliadin IgA: 6.3 U/mL
Gliadin IgG: <1 U/mL
Immunoglobulin A: 104 mg/dL (ref 70–320)

## 2024-09-03 ENCOUNTER — Ambulatory Visit (HOSPITAL_COMMUNITY)
Admission: RE | Admit: 2024-09-03 | Discharge: 2024-09-03 | Disposition: A | Discharge status: Home / Self Care | Attending: Gastroenterology | Admitting: Gastroenterology

## 2024-09-03 ENCOUNTER — Other Ambulatory Visit: Payer: Self-pay

## 2024-09-03 ENCOUNTER — Ambulatory Visit (HOSPITAL_COMMUNITY)

## 2024-09-03 ENCOUNTER — Encounter (HOSPITAL_COMMUNITY): Payer: Self-pay | Admitting: Gastroenterology

## 2024-09-03 ENCOUNTER — Encounter (HOSPITAL_COMMUNITY)
Admission: RE | Disposition: A | Discharge status: Home / Self Care | Payer: Self-pay | Source: Home / Self Care | Attending: Gastroenterology

## 2024-09-03 DIAGNOSIS — R1013 Epigastric pain: Secondary | ICD-10-CM | POA: Diagnosis present

## 2024-09-03 DIAGNOSIS — K3189 Other diseases of stomach and duodenum: Secondary | ICD-10-CM | POA: Diagnosis not present

## 2024-09-03 DIAGNOSIS — K2950 Unspecified chronic gastritis without bleeding: Secondary | ICD-10-CM | POA: Diagnosis not present

## 2024-09-03 DIAGNOSIS — K259 Gastric ulcer, unspecified as acute or chronic, without hemorrhage or perforation: Secondary | ICD-10-CM | POA: Diagnosis not present

## 2024-09-03 DIAGNOSIS — I10 Essential (primary) hypertension: Secondary | ICD-10-CM | POA: Insufficient documentation

## 2024-09-03 DIAGNOSIS — R1011 Right upper quadrant pain: Secondary | ICD-10-CM | POA: Diagnosis not present

## 2024-09-03 HISTORY — PX: ESOPHAGOGASTRODUODENOSCOPY: SHX5428

## 2024-09-03 SURGERY — EGD (ESOPHAGOGASTRODUODENOSCOPY)
Anesthesia: General

## 2024-09-03 MED ORDER — LIDOCAINE 2% (20 MG/ML) 5 ML SYRINGE
INTRAMUSCULAR | Status: DC | PRN
  Administered 2024-09-03: 50 mg via INTRAVENOUS

## 2024-09-03 MED ORDER — LACTATED RINGERS IV SOLN: INTRAVENOUS | Status: DC

## 2024-09-03 MED ORDER — LACTATED RINGERS IV SOLN: INTRAVENOUS | Status: DC | PRN

## 2024-09-03 MED ORDER — PROPOFOL 500 MG/50ML IV EMUL
INTRAVENOUS | Status: DC | PRN
  Administered 2024-09-03: 50 mg via INTRAVENOUS
  Administered 2024-09-03: 140 mg via INTRAVENOUS

## 2024-09-03 NOTE — Anesthesia Preprocedure Evaluation (Addendum)
 Anesthesia Evaluation  Patient identified by MRN, date of birth, ID band Patient awake    Reviewed: Allergy  & Precautions, H&P , NPO status , Patient's Chart, lab work & pertinent test results  Airway Mallampati: III  TM Distance: >3 FB Neck ROM: Full  Mouth opening: Limited Mouth Opening  Dental no notable dental hx.    Pulmonary neg pulmonary ROS   Pulmonary exam normal breath sounds clear to auscultation       Cardiovascular hypertension, + DOE  Normal cardiovascular exam Rhythm:Regular Rate:Normal     Neuro/Psych  Headaches PSYCHIATRIC DISORDERS Anxiety Depression Bipolar Disorder      GI/Hepatic negative GI ROS, Neg liver ROS,,,  Endo/Other  negative endocrine ROS    Renal/GU Renal disease  negative genitourinary   Musculoskeletal negative musculoskeletal ROS (+)    Abdominal   Peds negative pediatric ROS (+)  Hematology negative hematology ROS (+)   Anesthesia Other Findings   Reproductive/Obstetrics negative OB ROS                              Anesthesia Physical Anesthesia Plan  ASA: 2  Anesthesia Plan: General   Post-op Pain Management:    Induction: Intravenous  PONV Risk Score and Plan:   Airway Management Planned: Nasal Cannula  Additional Equipment:   Intra-op Plan:   Post-operative Plan:   Informed Consent: I have reviewed the patients History and Physical, chart, labs and discussed the procedure including the risks, benefits and alternatives for the proposed anesthesia with the patient or authorized representative who has indicated his/her understanding and acceptance.     Dental advisory given  Plan Discussed with: CRNA  Anesthesia Plan Comments:          Anesthesia Quick Evaluation

## 2024-09-03 NOTE — Transfer of Care (Signed)
 Immediate Anesthesia Transfer of Care Note  Patient: [name]  Procedure(s) Performed: EGD (ESOPHAGOGASTRODUODENOSCOPY)  Patient Location: Endoscopy Unit  Anesthesia Type:General  Level of Consciousness: awake  Airway & Oxygen Therapy: Patient Spontanous Breathing  Post-op Assessment: Report given to RN and Post -op Vital signs reviewed and stable  Post vital signs: Reviewed and stable  Last Vitals:  Vitals Value Taken Time  BP 91/70 09/03/24 10:44  Temp 36.4 C 09/03/24 10:44  Pulse 89 09/03/24 10:44  Resp 20 09/03/24 10:44  SpO2 97 % 09/03/24 10:44    Last Pain:  Vitals:   09/03/24 1044  TempSrc: Oral  PainSc:       Patients Stated Pain Goal: 6 (09/03/24 0930)  Complications: No notable events documented.

## 2024-09-03 NOTE — Anesthesia Postprocedure Evaluation (Signed)
 Anesthesia Post Note  Patient: [name]  Procedure(s) Performed: EGD (ESOPHAGOGASTRODUODENOSCOPY)  Patient location during evaluation: PACU Anesthesia Type: General Level of consciousness: awake and alert Pain management: pain level controlled Vital Signs Assessment: post-procedure vital signs reviewed and stable Respiratory status: spontaneous breathing, nonlabored ventilation, respiratory function stable and patient connected to nasal cannula oxygen Cardiovascular status: blood pressure returned to baseline and stable Postop Assessment: no apparent nausea or vomiting Anesthetic complications: no   No notable events documented.   Last Vitals:  Vitals:   09/03/24 1044 09/03/24 1049  BP: 91/70 96/71  Pulse: 89 91  Resp: 20 20  Temp: 36.4 C   SpO2: 97% 98%    Last Pain:  Vitals:   09/03/24 1049  TempSrc:   PainSc: 0-No pain                 Andrea Limes

## 2024-09-03 NOTE — Anesthesia Procedure Notes (Signed)
 Date/Time: 09/03/2024 10:29 AM  Performed by: Barbarann Verneita RAMAN, CRNAPre-anesthesia Checklist: Patient identified, Emergency Drugs available, Suction available, Timeout performed and Patient being monitored Patient Re-evaluated:Patient Re-evaluated prior to induction Oxygen Delivery Method: Nasal Cannula

## 2024-09-03 NOTE — Interval H&P Note (Signed)
 History and Physical Interval Note:  09/03/2024 9:26 AM  [name]  has presented today for surgery, with the diagnosis of early satiety, epigastric pain.  The various methods of treatment have been discussed with the patient and family. After consideration of risks, benefits and other options for treatment, the patient has consented to  Procedure(s) with comments: EGD (ESOPHAGOGASTRODUODENOSCOPY) (N/A) - 11:00 AM, ASA 1-2 as a surgical intervention.  The patient's history has been reviewed, patient examined, no change in status, stable for surgery.  I have reviewed the patient's chart and labs.  Questions were answered to the patient's satisfaction.     Murad Staples Castaneda Mayorga

## 2024-09-03 NOTE — Discharge Instructions (Signed)
You are being discharged to home.  Resume your previous diet.  We are waiting for your pathology results.  

## 2024-09-03 NOTE — Op Note (Signed)
 [facility] Patient Name: [name] Procedure Date: 09/03/2024 10:19 AM MRN: [id number] Date of Birth: Mar 11, 1961 Attending MD: Toribio Fortune , , [id number] CSN: [id number] Age: 63 Admit Type: Outpatient Procedure:                Upper GI endoscopy Indications:              Epigastric abdominal pain, Abdominal pain in the                            right upper quadrant Providers:                Toribio Fortune, Jon LABOR. Gerome RN, RN, Jon Loge Referring MD:              Medicines:                Monitored Anesthesia Care Complications:            No immediate complications. Estimated Blood Loss:     Estimated blood loss: none. Procedure:                Pre-Anesthesia Assessment:                           - Prior to the procedure, a History and Physical                            was performed, and patient medications, allergies                            and sensitivities were reviewed. The patient's                            tolerance of previous anesthesia was reviewed.                           - The risks and benefits of the procedure and the                            sedation options and risks were discussed with the                            patient. All questions were answered and informed                            consent was obtained.                           - ASA Grade Assessment: II - A patient with mild                            systemic disease.                           After obtaining informed consent, the endoscope was  passed under direct vision. Throughout the                            procedure, the patient's blood pressure, pulse, and                            oxygen saturations were monitored continuously. The                            [id number] ([id number]) scope was introduced through                            the mouth, and advanced to the second part of                             duodenum. The upper GI endoscopy was accomplished                            without difficulty. The patient tolerated the                            procedure well. Scope In: 10:34:33 AM Scope Out: 10:39:48 AM Total Procedure Duration: 0 hours 5 minutes 15 seconds  Findings:      The examined esophagus was normal.      The Z-line was regular and was found 38 cm from the incisors.      A few localized erosions with no stigmata of recent bleeding were found       in the gastric antrum. Biopsies were taken with a cold forceps for       Helicobacter pylori testing.      The examined duodenum was normal. Biopsies were taken with a cold       forceps for histology. Impression:               - Normal esophagus.                           - Z-line regular, 38 cm from the incisors.                           - Gastric erosions with no stigmata of recent                            bleeding. Biopsied.                           - Normal examined duodenum. Biopsied. Moderate Sedation:      Per Anesthesia Care Recommendation:           - Discharge patient to home (ambulatory).                           - Resume previous diet.                           - Await pathology results.                           -  If negative pathology, will proceed with gastric                            emptying study. Procedure Code(s):        --- Professional ---                           [phone], Esophagogastroduodenoscopy, flexible,                            transoral; with biopsy, single or multiple Diagnosis Code(s):        --- Professional ---                           K25.9, Gastric ulcer, unspecified as acute or                            chronic, without hemorrhage or perforation                           R10.13, Epigastric pain                           R10.11, Right upper quadrant pain CPT copyright 2022 American Medical Association. All rights reserved. The codes documented in this report are preliminary  and upon coder review may  be revised to meet current compliance requirements. Toribio Fortune, MD Toribio Fortune,  09/03/2024 10:45:28 AM This report has been signed electronically. Number of Addenda: 0

## 2024-09-06 ENCOUNTER — Encounter (HOSPITAL_COMMUNITY): Payer: Self-pay | Admitting: Gastroenterology

## 2024-09-06 ENCOUNTER — Ambulatory Visit (INDEPENDENT_AMBULATORY_CARE_PROVIDER_SITE_OTHER): Payer: Self-pay | Admitting: Gastroenterology

## 2024-09-06 DIAGNOSIS — R634 Abnormal weight loss: Secondary | ICD-10-CM

## 2024-09-06 DIAGNOSIS — R6881 Early satiety: Secondary | ICD-10-CM

## 2024-09-06 LAB — SURGICAL PATHOLOGY

## 2024-09-07 ENCOUNTER — Other Ambulatory Visit (INDEPENDENT_AMBULATORY_CARE_PROVIDER_SITE_OTHER): Payer: Self-pay

## 2024-09-07 DIAGNOSIS — R634 Abnormal weight loss: Secondary | ICD-10-CM

## 2024-09-07 DIAGNOSIS — R6881 Early satiety: Secondary | ICD-10-CM

## 2024-09-07 DIAGNOSIS — R1013 Epigastric pain: Secondary | ICD-10-CM

## 2024-09-07 NOTE — Progress Notes (Signed)
 procedure note and pathology result faxed to PCP

## 2024-09-07 NOTE — Telephone Encounter (Signed)
 Prior authorization is not required for this code.   Patient scheduled for gastric emptying on 09/10/2024 at 9:00am, patient aware of appointment date and time.

## 2024-09-09 ENCOUNTER — Telehealth (INDEPENDENT_AMBULATORY_CARE_PROVIDER_SITE_OTHER): Payer: Self-pay | Admitting: Gastroenterology

## 2024-09-09 NOTE — Telephone Encounter (Signed)
 She should do it some other day as the isotopes of the gastric emptying study may interact with the breath test.  She also needs to wait at least 14 days after stopping her PPI.

## 2024-09-09 NOTE — Telephone Encounter (Signed)
 Pt is wanting to know if she can have her breath test completed after her Gastric Emptying Study tomorrow. Pt states she is not on any PPI at this time. Please advise. Thank you!

## 2024-09-09 NOTE — Telephone Encounter (Signed)
 Pt contacted and verbalized understanding.

## 2024-09-10 ENCOUNTER — Encounter (HOSPITAL_COMMUNITY)
Admission: RE | Admit: 2024-09-10 | Discharge: 2024-09-10 | Disposition: A | Discharge status: Home / Self Care | Source: Ambulatory Visit | Attending: Gastroenterology | Admitting: Gastroenterology

## 2024-09-10 DIAGNOSIS — R634 Abnormal weight loss: Secondary | ICD-10-CM | POA: Diagnosis present

## 2024-09-10 DIAGNOSIS — R6881 Early satiety: Secondary | ICD-10-CM | POA: Diagnosis present

## 2024-09-10 MED ORDER — TECHNETIUM TC 99M SULFUR COLLOID
2.0000 | Freq: Once | INTRAVENOUS | Status: AC | PRN
  Administered 2024-09-10: 1.8 via ORAL

## 2024-09-13 ENCOUNTER — Ambulatory Visit (INDEPENDENT_AMBULATORY_CARE_PROVIDER_SITE_OTHER): Payer: Self-pay | Admitting: Gastroenterology

## 2024-09-13 ENCOUNTER — Encounter (INDEPENDENT_AMBULATORY_CARE_PROVIDER_SITE_OTHER): Admitting: Gastroenterology

## 2024-09-19 ENCOUNTER — Other Ambulatory Visit (HOSPITAL_COMMUNITY): Payer: Self-pay | Admitting: Psychiatry

## 2024-09-19 DIAGNOSIS — F411 Generalized anxiety disorder: Secondary | ICD-10-CM

## 2024-09-27 ENCOUNTER — Other Ambulatory Visit (HOSPITAL_COMMUNITY): Payer: Self-pay | Admitting: *Deleted

## 2024-09-27 DIAGNOSIS — F411 Generalized anxiety disorder: Secondary | ICD-10-CM

## 2024-09-27 MED ORDER — HYDROXYZINE HCL 25 MG PO TABS: ORAL_TABLET | ORAL | 0 refills | Status: DC

## 2024-10-01 ENCOUNTER — Telehealth (HOSPITAL_COMMUNITY): Admitting: Psychiatry

## 2024-10-05 ENCOUNTER — Telehealth (HOSPITAL_COMMUNITY): Admitting: Psychiatry

## 2024-10-06 ENCOUNTER — Encounter (INDEPENDENT_AMBULATORY_CARE_PROVIDER_SITE_OTHER): Payer: Self-pay | Admitting: Gastroenterology

## 2024-10-13 ENCOUNTER — Encounter (HOSPITAL_COMMUNITY): Payer: Self-pay | Admitting: Psychiatry

## 2024-10-13 ENCOUNTER — Telehealth (HOSPITAL_COMMUNITY): Admitting: Psychiatry

## 2024-10-13 VITALS — Wt 190.0 lb

## 2024-10-13 DIAGNOSIS — F411 Generalized anxiety disorder: Secondary | ICD-10-CM

## 2024-10-13 DIAGNOSIS — F319 Bipolar disorder, unspecified: Secondary | ICD-10-CM | POA: Diagnosis not present

## 2024-10-13 DIAGNOSIS — F09 Unspecified mental disorder due to known physiological condition: Secondary | ICD-10-CM | POA: Diagnosis not present

## 2024-10-13 MED ORDER — BUPROPION HCL ER (XL) 300 MG PO TB24: 300.0000 mg | ORAL_TABLET | Freq: Every day | ORAL | 0 refills | Status: DC

## 2024-10-13 MED ORDER — CLONAZEPAM 1 MG PO TABS: 1.0000 mg | ORAL_TABLET | Freq: Every day | ORAL | 0 refills | Status: DC

## 2024-10-13 MED ORDER — RISPERIDONE 1 MG PO TABS: 1.0000 mg | ORAL_TABLET | Freq: Every day | ORAL | 0 refills | Status: DC

## 2024-10-13 MED ORDER — HYDROXYZINE HCL 25 MG PO TABS: ORAL_TABLET | ORAL | 0 refills | Status: DC

## 2024-10-13 NOTE — Progress Notes (Signed)
 [facility] MD Virtual Progress Note   Patient Location: Home Provider Location: Home Office  I connect with patient by video and verified that I am speaking with correct person by using two identifiers. I discussed the limitations of evaluation and management by telemedicine and the availability of in person appointments. I also discussed with the patient that there may be a patient responsible charge related to this service. The patient expressed understanding and agreed to proceed.  [name] [id number] 63 y.o.  10/13/2024 11:17 AM  History of Present Illness:  Patient is evaluated by video session.  She reported a lot of concern anxiety about her general health.  Recently find out that she has osteoporosis.  She also not eating well and has low appetite.  She lost 17 pounds since the last visit.  She is not scheduled to see sleep doctor.  She is taking Fosamax to help her osteoporosis.  Recently she had a visit with GI and had procedure but her liver enzymes were normal.  Her creatinine slightly above and she is very concerned about her kidney but frustrated because not able to see the kidney doctor sooner.  Her last labs shows low potassium.  She denies any mania, psychosis, agitation or any impulsive behavior.  She denies any major panic attack but she feels sometimes overwhelmed because of her general physical health.  She had a good trip to mountains in first week of October.  Her husband, children and grandchildren.  She really enjoyed and she is hoping to go back again in January on her wedding anniversary.  She has mild tremors but does not interfere in her daily activities.  She admitted sometime get confused and do not remember things very well.  She had stopped driving and her husband takes her to the doctor appointments.  She is able to do her ADLs.  She feels the current medicine nightly is keeping her stable and she is not getting worst or having any suicidal  thoughts.  Past Psychiatric History: H/O bipolar disorder and multiple inpatient treatment.  Last inpatient in September 2014 for suicidal thinking, hallucination as playing with guns and superficially cut her wrist with a needle.  H/O mania and psychosis. Tried Zyprexa Seroquel but stopped due to weight gain.  Tegretol  caused increased paranoia confusion and required inpatient.    Past Medical History:  Diagnosis Date   Anxiety    Bipolar disorder (HCC)    Brain tumor (benign) (HCC)    Common migraine with intractable migraine 02/06/2021   Depression    HTN (hypertension)    Kidney disease    Mania (HCC)    Meningioma (HCC)    Migraine    Tremor 02/06/2021   Lithium  induced    Outpatient Encounter Medications as of 10/13/2024  Medication Sig   acetaminophen  (TYLENOL ) 325 MG tablet Take 650 mg by mouth as needed.   alendronate (FOSAMAX) 70 MG tablet Take 70 mg by mouth once a week. Take with a full glass of water on an empty stomach.   amLODipine (NORVASC) 5 MG tablet Take 5 mg by mouth daily.   buPROPion  (WELLBUTRIN  XL) 300 MG 24 hr tablet Take 1 tablet (300 mg total) by mouth daily. TAKE ONE TABLET BY MOUTH EVERY MORINING FOR DEPRESSION   clonazePAM  (KLONOPIN ) 1 MG tablet Take 1 tablet (1 mg total) by mouth at bedtime.   cyanocobalamin (VITAMIN B12) 1000 MCG tablet Take 1,000 mcg by mouth daily.   dicyclomine  (BENTYL ) 10 MG  capsule Take 1 capsule (10 mg total) by mouth every 12 (twelve) hours as needed.   hydrOXYzine  (ATARAX ) 25 MG tablet Take one tab as needed at bedtime.   Melatonin 5 MG CAPS Take 10 mg by mouth at bedtime.   metoprolol  succinate (TOPROL  XL) 25 MG 24 hr tablet Take 1 tablet (25 mg total) by mouth daily.   pantoprazole  (PROTONIX ) 20 MG tablet Take 1 tablet (20 mg total) by mouth daily. (Patient not taking: Reported on 09/01/2024)   potassium chloride  SA (KLOR-CON  M) 20 MEQ tablet Take 20 mEq by mouth daily.   prochlorperazine  (COMPAZINE ) 10 MG tablet Take 1  tablet (10 mg total) by mouth 2 (two) times daily as needed for nausea or vomiting.   risperiDONE  (RISPERDAL ) 1 MG tablet Take 1 tablet (1 mg total) by mouth at bedtime.   No facility-administered encounter medications on file as of 10/13/2024.    Recent Results (from the past 2160 hours)  Urinalysis, Routine w reflex microscopic -Urine, Clean Catch     Status: Abnormal   Collection Time: 08/27/24  5:58 PM  Result Value Ref Range   Color, Urine YELLOW YELLOW   APPearance HAZY (A) CLEAR   Specific Gravity, Urine 1.014 1.005 - 1.030   pH 5.0 5.0 - 8.0   Glucose, UA NEGATIVE NEGATIVE mg/dL   Hgb urine dipstick NEGATIVE NEGATIVE   Bilirubin Urine NEGATIVE NEGATIVE   Ketones, ur NEGATIVE NEGATIVE mg/dL   Protein, ur NEGATIVE NEGATIVE mg/dL   Nitrite NEGATIVE NEGATIVE   Leukocytes,Ua MODERATE (A) NEGATIVE   RBC / HPF 0-5 0 - 5 RBC/hpf   WBC, UA 21-50 0 - 5 WBC/hpf   Bacteria, UA RARE (A) NONE SEEN   Squamous Epithelial / HPF 11-20 0 - 5 /HPF   Mucus PRESENT    Non Squamous Epithelial 0-5 (A) NONE SEEN    Comment: Performed at [facility], [address], [place], [place] [address]  Lipase, blood     Status: None   Collection Time: 08/27/24  6:07 PM  Result Value Ref Range   Lipase 35 11 - 51 U/L    Comment: Performed at [facility], [address] [place], [place] [address]  Comprehensive metabolic panel     Status: Abnormal   Collection Time: 08/27/24  6:07 PM  Result Value Ref Range   Sodium 138 135 - 145 mmol/L   Potassium 3.2 (L) 3.5 - 5.1 mmol/L   Chloride 100 98 - 111 mmol/L   CO2 21 (L) 22 - 32 mmol/L   Glucose, Bld 118 (H) 70 - 99 mg/dL    Comment: Glucose reference range applies only to samples taken after fasting for at least 8 hours.   BUN 17 8 - 23 mg/dL   Creatinine, Ser 8.58 (H) 0.44 - 1.00 mg/dL   Calcium 9.5 8.9 - 89.6 mg/dL   Total Protein 6.9 6.5 - 8.1 g/dL   Albumin 3.9 3.5 - 5.0 g/dL   AST 23 15 - 41 U/L   ALT 18 0 - 44 U/L   Alkaline Phosphatase 100 38  - 126 U/L   Total Bilirubin 0.8 0.0 - 1.2 mg/dL   GFR, Estimated 42 (L) >60 mL/min    Comment: (NOTE) Calculated using the CKD-EPI Creatinine Equation (2021)    Anion gap 17 (H) 5 - 15    Comment: Performed at [facility], [address] [place], [place] [address]  CBC     Status: None   Collection Time: 08/27/24  6:07 PM  Result Value Ref Range   WBC 9.5 4.0 - 10.5 K/uL   RBC 4.92 3.87 - 5.11 MIL/uL   Hemoglobin 15.0 12.0 - 15.0 g/dL   HCT 56.1 63.9 - 53.9 %   MCV 89.0 80.0 - 100.0 fL   MCH 30.5 26.0 - 34.0 pg   MCHC 34.2 30.0 - 36.0 g/dL   RDW 87.5 88.4 - 84.4 %   Platelets 300 150 - 400 K/uL   nRBC 0.0 0.0 - 0.2 %    Comment: Performed at [facility], [address] [place], [place] [address]  Celiac Disease Panel     Status: None   Collection Time: 09/01/24  1:04 PM  Result Value Ref Range   Immunoglobulin A 104 70 - 320 mg/dL   Gliadin IgA 6.3 U/mL    Comment: Value          Interpretation -----          -------------- <15.0          Antibody not detected > or = 15.0    Antibody detected .    Gliadin IgG <1.0 U/mL    Comment: Value          Interpretation -----          -------------- <15.0          Antibody not detected > or = 15.0    Antibody detected .    (tTG) Ab, IgG <1.0 U/mL    Comment: Value          Interpretation -----          -------------- <15.0          Antibody not detected > or = 15.0    Antibody detected .    (tTG) Ab, IgA <1.0 U/mL    Comment: Value          Interpretation -----          -------------- <15.0          Antibody not detected > or = 15.0    Antibody detected .   Surgical pathology     Status: None   Collection Time: 09/03/24 10:36 AM  Result Value Ref Range   SURGICAL PATHOLOGY      SURGICAL PATHOLOGY CASE: [id number] PATIENT: Damali Swayze Surgical Pathology Report     Clinical History: early satiety, epigastric pain(tb)     FINAL MICROSCOPIC DIAGNOSIS:  A. SMALL BOWEL BIOPSY: Duodenal mucosa with normal  villous architecture. No villous atrophy or increased intraepithelial lymphocytes.  B. STOMACH BIOPSY: Gastric antral and oxyntic mucosa with slight chronic inflammation and hyperemia. Negative for Helicobacter pylori.  GROSS DESCRIPTION: A.  Received in formalin are tan, soft tissue fragments that are submitted in toto. Number: 3.  Size: 0.4 cm.  Blocks: 1.  B.  Received in formalin are tan, soft tissue fragments that are submitted in toto. Number: 4.  Size: 0.4 cm.  Blocks: 1. (WC 09/03/2024)  Final Diagnosis performed by Norleen Dover, MD.   Electronically signed 09/06/2024 Technical component performed at [facility], [address] [place], [place] [address].  Professional component performed at [employer].  [facility], [address], [place], [place] [address].  Immunohistochemistry Technical component (if applicable) was performed at [facility]. [address], [place], [place] [address].   IMMUNOHISTOCHEMISTRY DISCLAIMER (if applicable): Some of these immunohistochemical stains may have been developed and the performance characteristics determine by [facility].  Some may not have been cleared or approved by the U.S. Food and Drug Administration. The FDA has determined that such clearance or approval is not necessary. This test is used for clinical purposes. It should not be regarded as investigational or for research. This laboratory is certified under the Clinical Laboratory Improvement Amendments of 1988 (CLIA-88) as qualified to perform high complexity clinical laboratory testing.  The controls stained appropriately.   IHC stains are performed on formalin fixed, paraffin embedded tissue using a 3,3diaminobenzidine ( DAB) chromogen and Leica Bond Autostainer System. The staining intensity of the nucleus is score manually and is reported as the percentage of tumor cell nuclei demonstrating specific  nuclear staining. The specimens are fixed in 10% Neutral Formalin for at least 6 hours and up to 72hrs. These tests are validated on decalcified tissue. Results should be interpreted with caution given the possibility of false negative results on decalcified specimens. Antibody Clones are as follows ER-clone 95F, PR-clone 16, Ki67- clone MM1. Some of these immunohistochemical stains may have been developed and the performance characteristics determined by [facility].      Psychiatric Specialty Exam: Physical Exam  Review of Systems  Constitutional:  Positive for activity change.  Psychiatric/Behavioral:  Positive for sleep disturbance.     Weight 190 lb (86.2 kg).There is no height or weight on file to calculate BMI.  General Appearance: Casual  Eye Contact:  Good  Speech:  Normal Rate  Volume:  Normal  Mood:  Euthymic  Affect:  Appropriate  Thought Process:  Goal Directed  Orientation:  Full (Time, Place, and Person)  Thought Content:  Logical  Suicidal Thoughts:  No  Homicidal Thoughts:  No  Memory:  Immediate;   Good Recent;   Good Remote;   Good  Judgement:  Good  Insight:  Present  Psychomotor Activity:  Normal  Concentration:  Concentration: Good and Attention Span: Good  Recall:  Good  Fund of Knowledge:  Good  Language:  Good  Akathisia:  No  Handed:  Right  AIMS (if indicated):     Assets:  Communication Skills Desire for Improvement Housing Social Support Transportation  ADL's:  Intact  Cognition:  WNL  Sleep:  fair       07/05/2024   11:47 AM  Depression screen PHQ 2/9  Decreased Interest 1  Down, Depressed, Hopeless 1  PHQ - 2 Score 2  Altered sleeping 1  Tired, decreased energy 0  Change in appetite 0  Feeling bad or failure about yourself  0  Trouble concentrating 0  Moving slowly or fidgety/restless 0  Suicidal thoughts 0  PHQ-9 Score 3  Difficult doing work/chores Not difficult at all    Assessment/Plan: Bipolar I disorder  (HCC) - Plan: clonazePAM  (KLONOPIN ) 1 MG tablet, buPROPion  (WELLBUTRIN  XL) 300 MG 24 hr tablet, risperiDONE  (RISPERDAL ) 1 MG tablet  GAD (generalized anxiety disorder) - Plan: buPROPion  (WELLBUTRIN  XL) 300 MG 24 hr tablet, hydrOXYzine  (ATARAX ) 25 MG tablet  Mild cognitive disorder - Plan: buPROPion  (WELLBUTRIN  XL) 300 MG 24 hr tablet  Patient is 63 year old female with multiple health issues including temporal lobe lesions, tremors, hypertension, migraine, history of meningioma, bipolar disorder, mild cognitive impairment, anxiety.  Reviewed blood work results and collateral information from other providers.  Reassurance given.  She is still concerned about a lot of health issues and I encouraged to see the primary care on a regular basis to address those symptoms.  She is scheduled to have sleep study.  Reviewed labs.  Her creatinine is slightly high and I encourage discussed with her primary care to get nutrition consult as patient not sure what to eat and her potassium is low.  She does not want to change the medication since at least keeping her stable and symptoms are not getting worse.  Will keep the Wellbutrin  XL 300 mg daily, Klonopin  1 mg at bedtime, hydroxyzine  25 mg at bedtime and risperidone  1 mg at bedtime.  Also discussed psychotropic medication can cause memory issues but patient reluctant to cut down as she does not want her psychiatric symptoms to come back.  Will follow-up in 3 months.  Encouraged to call back if she has any question or any concern.   Follow Up Instructions:     I discussed the assessment and treatment plan with the patient. The patient was provided an opportunity to ask questions and all were answered. The patient agreed with the plan and demonstrated an understanding of the instructions.   The patient was advised to call back or seek an in-person evaluation if the symptoms worsen or if the condition fails to improve as anticipated.    Collaboration of Care:  Other provider involved in patient's care AEB notes are available in epic to review  Patient/Guardian was advised Release of Information must be obtained prior to any record release in order to collaborate their care with an outside provider. Patient/Guardian was advised if they have not already done so to contact the registration department to sign all necessary forms in order for us  to release information regarding their care.   Consent: Patient/Guardian gives verbal consent for treatment and assignment of benefits for services provided during this visit. Patient/Guardian expressed understanding and agreed to proceed.     Total encounter time 29 minutes which includes face-to-face time, chart reviewed, care coordination, order entry and documentation during this encounter.   Note: This document was prepared by [employer] voice dictation technology and any errors that results from this process are unintentional.    Leni ONEIDA Client, MD 10/13/2024

## 2024-10-14 ENCOUNTER — Encounter (INDEPENDENT_AMBULATORY_CARE_PROVIDER_SITE_OTHER): Payer: Self-pay | Admitting: Gastroenterology

## 2024-10-15 ENCOUNTER — Telehealth (HOSPITAL_COMMUNITY): Admitting: Psychiatry

## 2024-10-15 ENCOUNTER — Encounter (HOSPITAL_COMMUNITY): Payer: Self-pay

## 2024-11-12 ENCOUNTER — Telehealth: Payer: Self-pay | Admitting: Internal Medicine

## 2024-11-12 NOTE — Telephone Encounter (Signed)
 Pt called in to change her appt 02/09/25. States she can only do afternoon. Declined seeing NP/PA. Dr. Mallipeddi has no afternoon appts, only NP. Pt requested to speak with someone else about it.

## 2024-12-21 ENCOUNTER — Other Ambulatory Visit: Payer: Self-pay | Admitting: Internal Medicine

## 2025-01-12 ENCOUNTER — Telehealth (HOSPITAL_COMMUNITY): Admitting: Psychiatry

## 2025-01-12 ENCOUNTER — Encounter (HOSPITAL_COMMUNITY): Payer: Self-pay | Admitting: Psychiatry

## 2025-01-12 VITALS — Wt 190.0 lb

## 2025-01-12 DIAGNOSIS — F411 Generalized anxiety disorder: Secondary | ICD-10-CM | POA: Diagnosis not present

## 2025-01-12 DIAGNOSIS — F09 Unspecified mental disorder due to known physiological condition: Secondary | ICD-10-CM

## 2025-01-12 DIAGNOSIS — F319 Bipolar disorder, unspecified: Secondary | ICD-10-CM | POA: Diagnosis not present

## 2025-01-12 MED ORDER — BUPROPION HCL ER (XL) 300 MG PO TB24: 300.0000 mg | ORAL_TABLET | Freq: Every day | ORAL | 0 refills | Status: AC

## 2025-01-12 MED ORDER — CLONAZEPAM 1 MG PO TABS: 1.0000 mg | ORAL_TABLET | Freq: Every day | ORAL | 0 refills | Status: AC

## 2025-01-12 MED ORDER — HYDROXYZINE HCL 10 MG PO TABS: 10.0000 mg | ORAL_TABLET | Freq: Every day | ORAL | 0 refills | Status: AC | PRN

## 2025-01-12 MED ORDER — RISPERIDONE 1 MG PO TABS: 1.0000 mg | ORAL_TABLET | Freq: Every day | ORAL | 0 refills | Status: AC

## 2025-01-12 NOTE — Progress Notes (Signed)
 " [facility] MD Virtual Progress Note   Patient Location: Home Provider Location: Home Office  I connect with patient by video and verified that I am speaking with correct person by using two identifiers. I discussed the limitations of evaluation and management by telemedicine and the availability of in person appointments. I also discussed with the patient that there may be a patient responsible charge related to this service. The patient expressed understanding and agreed to proceed.  [name] [id number] 64 y.o.  01/12/2025 11:10 AM  History of Present Illness: . Patient is evaluated by video session.  She reported Christmas was okay.  She was disappointed because a lot of family member were sick.  Patient reported lately having issues with the sleep and get very fatigued and tired.  Daughter recommended to use oxygen at night because she is not sleeping restful at night.  She also saw a nephrologist and last kidney function were normal.  She is very pleased with the results.  Her GFR increased 65, BUN 10 and creatinine 0.9.  However she reported decreased energy, lack of motivation.  She is going to see sleep doctor to get sleep studies.  She is not sure if she diagnosed with apnea.  She is no longer taking potassium.  She denies any agitation, irritability, mania, psychosis or any hallucination.  She still struggle with chronic memory issues but able to function without any significant impairment.  Denies any panic attack.  She is hoping they can figure out her sleep issue.  She is little concerned about her low oxygen dropping at night.  Denies any agitation, suicidal thoughts or homicidal thoughts.  She does not get overwhelmed as she used to in the past.  She is taking hydroxyzine , Klonopin , risperidone  and Wellbutrin .  Denies any tremors or shakes.  Denies any drinking or using any illegal substances.  Past Psychiatric History: H/O bipolar disorder and multiple inpatient  treatment.  Last inpatient in September 2014 for suicidal thinking, hallucination as playing with guns and superficially cut her wrist with a needle.  H/O mania and psychosis. Tried Zyprexa Seroquel but stopped due to weight gain.  Tegretol  caused increased paranoia confusion and required inpatient.    Past Medical History:  Diagnosis Date   Anxiety    Bipolar disorder (HCC)    Brain tumor (benign) (HCC)    Common migraine with intractable migraine 02/06/2021   Depression    HTN (hypertension)    Kidney disease    Mania (HCC)    Meningioma (HCC)    Migraine    Tremor 02/06/2021   Lithium  induced    Outpatient Encounter Medications as of 01/12/2025  Medication Sig   acetaminophen  (TYLENOL ) 325 MG tablet Take 650 mg by mouth as needed.   alendronate (FOSAMAX) 70 MG tablet Take 70 mg by mouth once a week. Take with a full glass of water on an empty stomach.   amLODipine (NORVASC) 5 MG tablet Take 5 mg by mouth daily.   buPROPion  (WELLBUTRIN  XL) 300 MG 24 hr tablet Take 1 tablet (300 mg total) by mouth daily. TAKE ONE TABLET BY MOUTH EVERY MORINING FOR DEPRESSION   clonazePAM  (KLONOPIN ) 1 MG tablet Take 1 tablet (1 mg total) by mouth at bedtime.   cyanocobalamin (VITAMIN B12) 1000 MCG tablet Take 1,000 mcg by mouth daily.   dicyclomine  (BENTYL ) 10 MG capsule Take 1 capsule (10 mg total) by mouth every 12 (twelve) hours as needed.   hydrOXYzine  (ATARAX ) 25 MG tablet  Take one tab as needed at bedtime.   Melatonin 5 MG CAPS Take 10 mg by mouth at bedtime.   metoprolol  succinate (TOPROL -XL) 25 MG 24 hr tablet TAKE ONE TABLET BY MOUTH EVERY DAY   pantoprazole  (PROTONIX ) 20 MG tablet Take 1 tablet (20 mg total) by mouth daily. (Patient not taking: Reported on 09/01/2024)   potassium chloride  SA (KLOR-CON  M) 20 MEQ tablet Take 20 mEq by mouth daily.   prochlorperazine  (COMPAZINE ) 10 MG tablet Take 1 tablet (10 mg total) by mouth 2 (two) times daily as needed for nausea or vomiting.   risperiDONE   (RISPERDAL ) 1 MG tablet Take 1 tablet (1 mg total) by mouth at bedtime.   No facility-administered encounter medications on file as of 01/12/2025.    No results found for this or any previous visit (from the past 2160 hours).    Psychiatric Specialty Exam: Physical Exam  Review of Systems  Constitutional:  Positive for activity change.  Psychiatric/Behavioral:  Positive for sleep disturbance.     Weight 190 lb (86.2 kg).There is no height or weight on file to calculate BMI.  General Appearance: Casual  Eye Contact:  Good  Speech:  Normal Rate  Volume:  Normal  Mood:  Euthymic  Affect:  Appropriate  Thought Process:  Goal Directed  Orientation:  Full (Time, Place, and Person)  Thought Content:  Logical  Suicidal Thoughts:  No  Homicidal Thoughts:  No  Memory:  Immediate;   Good Recent;   Good Remote;   Good  Judgement:  Good  Insight:  Present  Psychomotor Activity:  Normal  Concentration:  Concentration: Good and Attention Span: Good  Recall:  Good  Fund of Knowledge:  Good  Language:  Good  Akathisia:  No  Handed:  Right  AIMS (if indicated):     Assets:  Communication Skills Desire for Improvement Housing Social Support Transportation  ADL's:  Intact  Cognition:  WNL  Sleep:  using oxygen at night, keep wakening up       07/05/2024   11:47 AM  Depression screen PHQ 2/9  Decreased Interest 1  Down, Depressed, Hopeless 1  PHQ - 2 Score 2  Altered sleeping 1  Tired, decreased energy 0  Change in appetite 0  Feeling bad or failure about yourself  0  Trouble concentrating 0  Moving slowly or fidgety/restless 0  Suicidal thoughts 0  PHQ-9 Score 3   Difficult doing work/chores Not difficult at all     Data saved with a previous flowsheet row definition    Assessment/Plan: Bipolar I disorder (HCC) - Plan: buPROPion  (WELLBUTRIN  XL) 300 MG 24 hr tablet, clonazePAM  (KLONOPIN ) 1 MG tablet, risperiDONE  (RISPERDAL ) 1 MG tablet  GAD (generalized anxiety  disorder) - Plan: buPROPion  (WELLBUTRIN  XL) 300 MG 24 hr tablet, hydrOXYzine  (ATARAX ) 10 MG tablet  Mild cognitive disorder - Plan: buPROPion  (WELLBUTRIN  XL) 300 MG 24 hr tablet  Patient is 64 year old female with bipolar disorder, generalized anxiety disorder and struggle with memory and fatigue.  She has multiple health issues, temporal lobe lesion, tremors, hypertension, migraine and insomnia.  Lately need to use oxygen at night.  I reviewed blood work results.  BUN/creatinine much better.  She is no longer taking potassium, melatonin and Protonix .  Recommend to decrease hydroxyzine  from 25 mg to 10 mg to help energy level and in the meantime she will get more study to rule out apnea.  Continue Klonopin  1 mg at bedtime, Wellbutrin  XL 300 mg daily and risperidone  1  mg at bedtime.  Her mania and depression is a stable.  Recommend to call back if she has any question or any concern.  She has memory impairment but stable and if symptoms get worse then may need to see a neurologist.  Follow-up in 3 months.  Follow Up Instructions:     I discussed the assessment and treatment plan with the patient. The patient was provided an opportunity to ask questions and all were answered. The patient agreed with the plan and demonstrated an understanding of the instructions.   The patient was advised to call back or seek an in-person evaluation if the symptoms worsen or if the condition fails to improve as anticipated.    Collaboration of Care: Other provider involved in patient's care AEB notes are available in epic to review  Patient/Guardian was advised Release of Information must be obtained prior to any record release in order to collaborate their care with an outside provider. Patient/Guardian was advised if they have not already done so to contact the registration department to sign all necessary forms in order for us  to release information regarding their care.   Consent: Patient/Guardian gives verbal  consent for treatment and assignment of benefits for services provided during this visit. Patient/Guardian expressed understanding and agreed to proceed.     Total encounter time 34 minutes which includes face-to-face time, chart reviewed, care coordination, order entry and documentation during this encounter.   Note: This document was prepared by [employer] voice dictation technology and any errors that results from this process are unintentional.    Leni ONEIDA Client, MD 01/12/2025   "

## 2025-01-13 ENCOUNTER — Ambulatory Visit: Admitting: Student

## 2025-02-09 ENCOUNTER — Ambulatory Visit: Admitting: Internal Medicine

## 2025-04-04 ENCOUNTER — Ambulatory Visit: Admitting: Internal Medicine

## 2025-04-13 ENCOUNTER — Telehealth (HOSPITAL_COMMUNITY): Admitting: Psychiatry
